# Patient Record
Sex: Female | Born: 1935 | Race: White | Hispanic: No | Smoking: Never smoker
Health system: Southern US, Community
[De-identification: ages and names within clinical notes are randomized; demographics above are authoritative.]

## PROBLEM LIST (undated history)

## (undated) ENCOUNTER — Emergency Department: Discharge status: Absent without leave | Payer: Medicare Other

## (undated) DIAGNOSIS — I82409 Acute embolism and thrombosis of unspecified deep veins of unspecified lower extremity: Secondary | ICD-10-CM

## (undated) DIAGNOSIS — E119 Type 2 diabetes mellitus without complications: Secondary | ICD-10-CM

## (undated) DIAGNOSIS — M359 Systemic involvement of connective tissue, unspecified: Secondary | ICD-10-CM

## (undated) DIAGNOSIS — I10 Essential (primary) hypertension: Secondary | ICD-10-CM

## (undated) DIAGNOSIS — C50919 Malignant neoplasm of unspecified site of unspecified female breast: Secondary | ICD-10-CM

## (undated) DIAGNOSIS — M1990 Unspecified osteoarthritis, unspecified site: Secondary | ICD-10-CM

## (undated) DIAGNOSIS — I509 Heart failure, unspecified: Secondary | ICD-10-CM

## (undated) DIAGNOSIS — N289 Disorder of kidney and ureter, unspecified: Secondary | ICD-10-CM

## (undated) DIAGNOSIS — K649 Unspecified hemorrhoids: Secondary | ICD-10-CM

## (undated) DIAGNOSIS — C801 Malignant (primary) neoplasm, unspecified: Secondary | ICD-10-CM

## (undated) DIAGNOSIS — I639 Cerebral infarction, unspecified: Secondary | ICD-10-CM

## (undated) HISTORY — PX: OOPHORECTOMY: SHX86

## (undated) HISTORY — PX: BACK SURGERY: SHX140

## (undated) HISTORY — PX: HAND SURGERY: SHX662

## (undated) HISTORY — DX: Unspecified osteoarthritis, unspecified site: M19.90

## (undated) HISTORY — DX: Unspecified hemorrhoids: K64.9

## (undated) HISTORY — PX: ABDOMINAL HYSTERECTOMY: SHX81

---

## 1997-08-10 HISTORY — PX: BREAST BIOPSY: SHX20

## 1998-04-17 ENCOUNTER — Encounter: Payer: Self-pay | Admitting: Neurosurgery

## 1998-04-17 ENCOUNTER — Inpatient Hospital Stay (HOSPITAL_COMMUNITY): Admission: RE | Admit: 1998-04-17 | Discharge: 1998-04-18 | Payer: Self-pay | Admitting: Neurosurgery

## 2004-06-02 ENCOUNTER — Ambulatory Visit: Payer: Self-pay | Admitting: Family Medicine

## 2005-09-14 ENCOUNTER — Ambulatory Visit: Payer: Self-pay | Admitting: Family Medicine

## 2006-02-16 ENCOUNTER — Ambulatory Visit: Payer: Self-pay | Admitting: Cardiovascular Disease

## 2006-09-23 ENCOUNTER — Ambulatory Visit: Payer: Self-pay

## 2007-09-27 ENCOUNTER — Ambulatory Visit: Payer: Self-pay | Admitting: Family Medicine

## 2007-10-17 ENCOUNTER — Ambulatory Visit: Payer: Self-pay | Admitting: Family Medicine

## 2007-10-23 ENCOUNTER — Emergency Department: Payer: Self-pay | Admitting: Emergency Medicine

## 2007-11-03 ENCOUNTER — Ambulatory Visit: Payer: Self-pay | Admitting: Family Medicine

## 2008-11-07 ENCOUNTER — Ambulatory Visit: Payer: Self-pay | Admitting: Family Medicine

## 2009-01-07 ENCOUNTER — Ambulatory Visit: Payer: Self-pay | Admitting: Family Medicine

## 2009-01-09 ENCOUNTER — Ambulatory Visit: Payer: Self-pay | Admitting: Family Medicine

## 2009-11-08 ENCOUNTER — Ambulatory Visit: Payer: Self-pay | Admitting: Family Medicine

## 2010-01-24 ENCOUNTER — Inpatient Hospital Stay: Payer: Self-pay | Admitting: Internal Medicine

## 2010-11-11 ENCOUNTER — Ambulatory Visit: Payer: Self-pay | Admitting: Family Medicine

## 2011-01-13 ENCOUNTER — Ambulatory Visit: Payer: Self-pay | Admitting: Ophthalmology

## 2011-03-03 ENCOUNTER — Ambulatory Visit: Payer: Self-pay | Admitting: Ophthalmology

## 2011-07-10 ENCOUNTER — Ambulatory Visit: Payer: Self-pay | Admitting: Family Medicine

## 2011-07-23 ENCOUNTER — Ambulatory Visit: Payer: Self-pay | Admitting: Family Medicine

## 2011-09-08 ENCOUNTER — Ambulatory Visit: Payer: Self-pay | Admitting: Family Medicine

## 2011-11-24 ENCOUNTER — Ambulatory Visit: Payer: Self-pay | Admitting: Family Medicine

## 2011-12-01 ENCOUNTER — Ambulatory Visit: Payer: Self-pay | Admitting: Family Medicine

## 2011-12-09 ENCOUNTER — Ambulatory Visit: Payer: Self-pay | Admitting: Family Medicine

## 2012-07-19 ENCOUNTER — Ambulatory Visit: Payer: Self-pay | Admitting: Family Medicine

## 2012-08-09 ENCOUNTER — Ambulatory Visit: Payer: Self-pay | Admitting: Family Medicine

## 2012-08-14 ENCOUNTER — Emergency Department: Payer: Self-pay | Admitting: Emergency Medicine

## 2012-08-14 LAB — COMPREHENSIVE METABOLIC PANEL
Alkaline Phosphatase: 103 U/L (ref 50–136)
Anion Gap: 10 (ref 7–16)
BUN: 15 mg/dL (ref 7–18)
Bilirubin,Total: 0.4 mg/dL (ref 0.2–1.0)
Chloride: 97 mmol/L — ABNORMAL LOW (ref 98–107)
Co2: 25 mmol/L (ref 21–32)
Creatinine: 1.39 mg/dL — ABNORMAL HIGH (ref 0.60–1.30)
EGFR (African American): 41 — ABNORMAL LOW
Glucose: 103 mg/dL — ABNORMAL HIGH (ref 65–99)
Osmolality: 266 (ref 275–301)
SGPT (ALT): 16 U/L (ref 12–78)

## 2012-08-14 LAB — CK TOTAL AND CKMB (NOT AT ARMC)
CK, Total: 247 U/L — ABNORMAL HIGH (ref 21–215)
CK-MB: 1.6 ng/mL (ref 0.5–3.6)

## 2012-08-14 LAB — CBC
HGB: 9.6 g/dL — ABNORMAL LOW (ref 12.0–16.0)
MCV: 85 fL (ref 80–100)
Platelet: 277 10*3/uL (ref 150–440)
RDW: 14.1 % (ref 11.5–14.5)
WBC: 9.9 10*3/uL (ref 3.6–11.0)

## 2012-08-18 ENCOUNTER — Observation Stay: Payer: Self-pay | Admitting: Internal Medicine

## 2012-08-18 LAB — BASIC METABOLIC PANEL
Chloride: 99 mmol/L (ref 98–107)
Co2: 23 mmol/L (ref 21–32)
Creatinine: 1.94 mg/dL — ABNORMAL HIGH (ref 0.60–1.30)
EGFR (African American): 27 — ABNORMAL LOW
EGFR (Non-African Amer.): 24 — ABNORMAL LOW

## 2012-08-18 LAB — CBC
HGB: 9.5 g/dL — ABNORMAL LOW (ref 12.0–16.0)
MCHC: 33.5 g/dL (ref 32.0–36.0)
Platelet: 311 10*3/uL (ref 150–440)
RBC: 3.33 10*6/uL — ABNORMAL LOW (ref 3.80–5.20)

## 2012-08-18 LAB — COMPREHENSIVE METABOLIC PANEL
Albumin: 2.9 g/dL — ABNORMAL LOW (ref 3.4–5.0)
Alkaline Phosphatase: 94 U/L (ref 50–136)
Anion Gap: 11 (ref 7–16)
BUN: 33 mg/dL — ABNORMAL HIGH (ref 7–18)
Chloride: 95 mmol/L — ABNORMAL LOW (ref 98–107)
EGFR (African American): 28 — ABNORMAL LOW
EGFR (Non-African Amer.): 24 — ABNORMAL LOW
Potassium: 4.3 mmol/L (ref 3.5–5.1)
SGPT (ALT): 24 U/L (ref 12–78)
Sodium: 126 mmol/L — ABNORMAL LOW (ref 136–145)

## 2012-08-18 LAB — CK TOTAL AND CKMB (NOT AT ARMC)
CK, Total: 201 U/L (ref 21–215)
CK-MB: 2.2 ng/mL (ref 0.5–3.6)

## 2012-08-19 LAB — URINALYSIS, COMPLETE
Bilirubin,UR: NEGATIVE
Glucose,UR: NEGATIVE mg/dL (ref 0–75)
Ketone: NEGATIVE
Ph: 5 (ref 4.5–8.0)
Protein: NEGATIVE
RBC,UR: 1 /HPF (ref 0–5)
Specific Gravity: 1.008 (ref 1.003–1.030)
WBC UR: 1 /HPF (ref 0–5)

## 2012-08-19 LAB — BASIC METABOLIC PANEL
Anion Gap: 7 (ref 7–16)
BUN: 28 mg/dL — ABNORMAL HIGH (ref 7–18)
Co2: 24 mmol/L (ref 21–32)
Creatinine: 1.63 mg/dL — ABNORMAL HIGH (ref 0.60–1.30)
EGFR (African American): 34 — ABNORMAL LOW
EGFR (Non-African Amer.): 29 — ABNORMAL LOW
Glucose: 108 mg/dL — ABNORMAL HIGH (ref 65–99)
Osmolality: 272 (ref 275–301)
Potassium: 3.9 mmol/L (ref 3.5–5.1)
Sodium: 133 mmol/L — ABNORMAL LOW (ref 136–145)

## 2012-08-20 LAB — CREATININE, SERUM
Creatinine: 1.49 mg/dL — ABNORMAL HIGH (ref 0.60–1.30)
EGFR (African American): 38 — ABNORMAL LOW

## 2012-08-21 LAB — URINE CULTURE

## 2012-09-06 ENCOUNTER — Ambulatory Visit: Payer: Self-pay | Admitting: Family Medicine

## 2012-09-07 DIAGNOSIS — I059 Rheumatic mitral valve disease, unspecified: Secondary | ICD-10-CM

## 2012-12-02 ENCOUNTER — Ambulatory Visit: Payer: Self-pay | Admitting: Family Medicine

## 2013-03-25 LAB — COMPREHENSIVE METABOLIC PANEL
Albumin: 3.3 g/dL — ABNORMAL LOW (ref 3.4–5.0)
Alkaline Phosphatase: 95 U/L (ref 50–136)
Bilirubin,Total: 0.2 mg/dL (ref 0.2–1.0)
Co2: 24 mmol/L (ref 21–32)
Creatinine: 1.78 mg/dL — ABNORMAL HIGH (ref 0.60–1.30)
EGFR (African American): 30 — ABNORMAL LOW
EGFR (Non-African Amer.): 26 — ABNORMAL LOW
Osmolality: 255 (ref 275–301)
Potassium: 5.1 mmol/L (ref 3.5–5.1)
SGOT(AST): 15 U/L (ref 15–37)
SGPT (ALT): 17 U/L (ref 12–78)
Total Protein: 7.4 g/dL (ref 6.4–8.2)

## 2013-03-25 LAB — CBC
HCT: 30.3 % — ABNORMAL LOW (ref 35.0–47.0)
HGB: 10.6 g/dL — ABNORMAL LOW (ref 12.0–16.0)
MCHC: 35 g/dL (ref 32.0–36.0)
RBC: 3.6 10*6/uL — ABNORMAL LOW (ref 3.80–5.20)
WBC: 7.2 10*3/uL (ref 3.6–11.0)

## 2013-03-26 DIAGNOSIS — I517 Cardiomegaly: Secondary | ICD-10-CM

## 2013-03-27 LAB — BASIC METABOLIC PANEL
Anion Gap: 7 (ref 7–16)
BUN: 24 mg/dL — ABNORMAL HIGH (ref 7–18)
BUN: 24 mg/dL — ABNORMAL HIGH (ref 7–18)
Calcium, Total: 8.7 mg/dL (ref 8.5–10.1)
Chloride: 98 mmol/L (ref 98–107)
Co2: 22 mmol/L (ref 21–32)
Creatinine: 1.25 mg/dL (ref 0.60–1.30)
EGFR (African American): 45 — ABNORMAL LOW
EGFR (Non-African Amer.): 38 — ABNORMAL LOW
EGFR (Non-African Amer.): 40 — ABNORMAL LOW
Glucose: 64 mg/dL — ABNORMAL LOW (ref 65–99)
Glucose: 57 mg/dL — ABNORMAL LOW (ref 65–99)
Osmolality: 257 (ref 275–301)
Osmolality: 250 (ref 275–301)
Potassium: 5.1 mmol/L (ref 3.5–5.1)

## 2013-03-28 ENCOUNTER — Inpatient Hospital Stay: Payer: Self-pay | Admitting: Internal Medicine

## 2013-03-28 LAB — BASIC METABOLIC PANEL
BUN: 18 mg/dL (ref 7–18)
Calcium, Total: 8.3 mg/dL — ABNORMAL LOW (ref 8.5–10.1)
Co2: 21 mmol/L (ref 21–32)
EGFR (African American): 48 — ABNORMAL LOW
EGFR (Non-African Amer.): 42 — ABNORMAL LOW
Osmolality: 245 (ref 275–301)
Potassium: 5 mmol/L (ref 3.5–5.1)
Sodium: 121 mmol/L — ABNORMAL LOW (ref 136–145)

## 2013-03-28 LAB — TSH: Thyroid Stimulating Horm: 1.12 u[IU]/mL

## 2013-03-28 LAB — CBC WITH DIFFERENTIAL/PLATELET
Basophil %: 0.6 %
Eosinophil #: 0.1 10*3/uL (ref 0.0–0.7)
HGB: 9.4 g/dL — ABNORMAL LOW (ref 12.0–16.0)
Lymphocyte #: 1.3 10*3/uL (ref 1.0–3.6)
Lymphocyte %: 18.2 %
MCH: 29.8 pg (ref 26.0–34.0)
MCHC: 35.4 g/dL (ref 32.0–36.0)
MCV: 84 fL (ref 80–100)
Monocyte %: 6.7 %
RBC: 3.15 10*6/uL — ABNORMAL LOW (ref 3.80–5.20)
WBC: 7.4 10*3/uL (ref 3.6–11.0)

## 2013-03-28 LAB — URIC ACID: Uric Acid: 3.6 mg/dL (ref 2.6–6.0)

## 2013-03-28 LAB — OSMOLALITY: Osmolality: 250 mOsm/kg — CL (ref 280–301)

## 2013-03-29 LAB — BASIC METABOLIC PANEL
BUN: 24 mg/dL — ABNORMAL HIGH (ref 7–18)
Calcium, Total: 8.6 mg/dL (ref 8.5–10.1)
Chloride: 89 mmol/L — ABNORMAL LOW (ref 98–107)
Co2: 23 mmol/L (ref 21–32)
Creatinine: 1.32 mg/dL — ABNORMAL HIGH (ref 0.60–1.30)
EGFR (African American): 44 — ABNORMAL LOW
EGFR (Non-African Amer.): 38 — ABNORMAL LOW
Sodium: 119 mmol/L — CL (ref 136–145)

## 2013-03-29 LAB — PROTEIN / CREATININE RATIO, URINE
Creatinine, Urine: 33.3 mg/dL (ref 30.0–125.0)
Protein/Creat. Ratio: 180 mg/gCREAT (ref 0–200)

## 2013-03-29 LAB — PHOSPHORUS: Phosphorus: 3.7 mg/dL (ref 2.5–4.9)

## 2013-03-29 LAB — SODIUM: Sodium: 120 mmol/L — CL (ref 136–145)

## 2013-03-30 LAB — SODIUM
Sodium: 121 mmol/L — ABNORMAL LOW (ref 136–145)
Sodium: 118 mmol/L — CL (ref 136–145)

## 2013-03-30 LAB — BASIC METABOLIC PANEL
Anion Gap: 7 (ref 7–16)
BUN: 28 mg/dL — ABNORMAL HIGH (ref 7–18)
Calcium, Total: 8.4 mg/dL — ABNORMAL LOW (ref 8.5–10.1)
Co2: 24 mmol/L (ref 21–32)
Creatinine: 1.49 mg/dL — ABNORMAL HIGH (ref 0.60–1.30)
EGFR (Non-African Amer.): 33 — ABNORMAL LOW
Osmolality: 251 (ref 275–301)
Potassium: 4.7 mmol/L (ref 3.5–5.1)

## 2013-03-30 LAB — UR PROT ELECTROPHORESIS, URINE RANDOM

## 2013-03-31 LAB — BASIC METABOLIC PANEL
Anion Gap: 7 (ref 7–16)
BUN: 29 mg/dL — ABNORMAL HIGH (ref 7–18)
Chloride: 92 mmol/L — ABNORMAL LOW (ref 98–107)
EGFR (Non-African Amer.): 33 — ABNORMAL LOW
Glucose: 93 mg/dL (ref 65–99)
Osmolality: 253 (ref 275–301)
Potassium: 5 mmol/L (ref 3.5–5.1)
Sodium: 123 mmol/L — ABNORMAL LOW (ref 136–145)

## 2013-03-31 LAB — SODIUM
Sodium: 121 mmol/L — ABNORMAL LOW (ref 136–145)
Sodium: 121 mmol/L — ABNORMAL LOW (ref 136–145)
Sodium: 123 mmol/L — ABNORMAL LOW (ref 136–145)

## 2013-04-01 LAB — BASIC METABOLIC PANEL
BUN: 25 mg/dL — ABNORMAL HIGH (ref 7–18)
Co2: 23 mmol/L (ref 21–32)
Creatinine: 1.24 mg/dL (ref 0.60–1.30)
EGFR (African American): 47 — ABNORMAL LOW
Osmolality: 255 (ref 275–301)
Potassium: 4.9 mmol/L (ref 3.5–5.1)

## 2013-04-01 LAB — SODIUM: Sodium: 128 mmol/L — ABNORMAL LOW (ref 136–145)

## 2013-04-02 LAB — SODIUM: Sodium: 129 mmol/L — ABNORMAL LOW (ref 136–145)

## 2013-04-03 LAB — BASIC METABOLIC PANEL
Anion Gap: 8 (ref 7–16)
BUN: 19 mg/dL — ABNORMAL HIGH (ref 7–18)
Calcium, Total: 8.6 mg/dL (ref 8.5–10.1)
Co2: 24 mmol/L (ref 21–32)
EGFR (Non-African Amer.): 38 — ABNORMAL LOW
Osmolality: 262 (ref 275–301)
Sodium: 130 mmol/L — ABNORMAL LOW (ref 136–145)

## 2013-05-18 ENCOUNTER — Ambulatory Visit: Payer: Self-pay | Admitting: Internal Medicine

## 2013-05-18 ENCOUNTER — Ambulatory Visit: Payer: Self-pay | Admitting: Family Medicine

## 2013-05-18 LAB — SODIUM: Sodium: 132 mmol/L — ABNORMAL LOW (ref 136–145)

## 2013-05-18 LAB — RETICULOCYTES
Absolute Retic Count: 0.0764 10*6/uL (ref 0.019–0.186)
Reticulocyte: 2.1 % (ref 0.4–3.1)

## 2013-06-10 ENCOUNTER — Ambulatory Visit: Payer: Self-pay | Admitting: Internal Medicine

## 2013-06-30 ENCOUNTER — Emergency Department: Payer: Self-pay | Admitting: Internal Medicine

## 2013-07-10 ENCOUNTER — Ambulatory Visit: Payer: Self-pay | Admitting: Internal Medicine

## 2013-07-19 ENCOUNTER — Ambulatory Visit: Payer: Self-pay | Admitting: Orthopedic Surgery

## 2013-07-20 ENCOUNTER — Emergency Department (HOSPITAL_COMMUNITY): Payer: Medicare Other

## 2013-07-20 ENCOUNTER — Inpatient Hospital Stay (HOSPITAL_COMMUNITY)
Admission: EM | Admit: 2013-07-20 | Discharge: 2013-07-26 | DRG: 519 | Disposition: A | Discharge status: Home / Self Care | Payer: Medicare Other | Attending: Internal Medicine | Admitting: Internal Medicine

## 2013-07-20 ENCOUNTER — Inpatient Hospital Stay (HOSPITAL_COMMUNITY): Payer: Medicare Other

## 2013-07-20 ENCOUNTER — Encounter (HOSPITAL_COMMUNITY): Payer: Self-pay | Admitting: Emergency Medicine

## 2013-07-20 DIAGNOSIS — Z8673 Personal history of transient ischemic attack (TIA), and cerebral infarction without residual deficits: Secondary | ICD-10-CM

## 2013-07-20 DIAGNOSIS — K5900 Constipation, unspecified: Secondary | ICD-10-CM | POA: Diagnosis not present

## 2013-07-20 DIAGNOSIS — M48062 Spinal stenosis, lumbar region with neurogenic claudication: Secondary | ICD-10-CM | POA: Diagnosis present

## 2013-07-20 DIAGNOSIS — M51379 Other intervertebral disc degeneration, lumbosacral region without mention of lumbar back pain or lower extremity pain: Secondary | ICD-10-CM | POA: Diagnosis present

## 2013-07-20 DIAGNOSIS — M47817 Spondylosis without myelopathy or radiculopathy, lumbosacral region: Secondary | ICD-10-CM | POA: Diagnosis present

## 2013-07-20 DIAGNOSIS — E669 Obesity, unspecified: Secondary | ICD-10-CM | POA: Diagnosis present

## 2013-07-20 DIAGNOSIS — E871 Hypo-osmolality and hyponatremia: Secondary | ICD-10-CM | POA: Diagnosis present

## 2013-07-20 DIAGNOSIS — M5136 Other intervertebral disc degeneration, lumbar region: Secondary | ICD-10-CM

## 2013-07-20 DIAGNOSIS — M62838 Other muscle spasm: Secondary | ICD-10-CM | POA: Diagnosis present

## 2013-07-20 DIAGNOSIS — E119 Type 2 diabetes mellitus without complications: Secondary | ICD-10-CM | POA: Diagnosis present

## 2013-07-20 DIAGNOSIS — M51369 Other intervertebral disc degeneration, lumbar region without mention of lumbar back pain or lower extremity pain: Secondary | ICD-10-CM

## 2013-07-20 DIAGNOSIS — Z6841 Body Mass Index (BMI) 40.0 and over, adult: Secondary | ICD-10-CM

## 2013-07-20 DIAGNOSIS — M5137 Other intervertebral disc degeneration, lumbosacral region: Secondary | ICD-10-CM

## 2013-07-20 DIAGNOSIS — K219 Gastro-esophageal reflux disease without esophagitis: Secondary | ICD-10-CM | POA: Diagnosis present

## 2013-07-20 DIAGNOSIS — M549 Dorsalgia, unspecified: Secondary | ICD-10-CM

## 2013-07-20 DIAGNOSIS — N183 Chronic kidney disease, stage 3 unspecified: Secondary | ICD-10-CM | POA: Diagnosis present

## 2013-07-20 DIAGNOSIS — Z981 Arthrodesis status: Secondary | ICD-10-CM

## 2013-07-20 DIAGNOSIS — M48061 Spinal stenosis, lumbar region without neurogenic claudication: Secondary | ICD-10-CM | POA: Diagnosis present

## 2013-07-20 DIAGNOSIS — I509 Heart failure, unspecified: Secondary | ICD-10-CM | POA: Diagnosis present

## 2013-07-20 DIAGNOSIS — Z7982 Long term (current) use of aspirin: Secondary | ICD-10-CM

## 2013-07-20 DIAGNOSIS — E875 Hyperkalemia: Secondary | ICD-10-CM | POA: Diagnosis present

## 2013-07-20 DIAGNOSIS — I129 Hypertensive chronic kidney disease with stage 1 through stage 4 chronic kidney disease, or unspecified chronic kidney disease: Secondary | ICD-10-CM

## 2013-07-20 DIAGNOSIS — Z7902 Long term (current) use of antithrombotics/antiplatelets: Secondary | ICD-10-CM

## 2013-07-20 DIAGNOSIS — I10 Essential (primary) hypertension: Secondary | ICD-10-CM | POA: Diagnosis present

## 2013-07-20 DIAGNOSIS — M5126 Other intervertebral disc displacement, lumbar region: Principal | ICD-10-CM | POA: Diagnosis present

## 2013-07-20 DIAGNOSIS — D649 Anemia, unspecified: Secondary | ICD-10-CM | POA: Diagnosis present

## 2013-07-20 DIAGNOSIS — Z79899 Other long term (current) drug therapy: Secondary | ICD-10-CM

## 2013-07-20 HISTORY — DX: Type 2 diabetes mellitus without complications: E11.9

## 2013-07-20 HISTORY — DX: Cerebral infarction, unspecified: I63.9

## 2013-07-20 HISTORY — DX: Heart failure, unspecified: I50.9

## 2013-07-20 HISTORY — DX: Essential (primary) hypertension: I10

## 2013-07-20 HISTORY — DX: Disorder of kidney and ureter, unspecified: N28.9

## 2013-07-20 HISTORY — DX: Malignant (primary) neoplasm, unspecified: C80.1

## 2013-07-20 LAB — CBC WITH DIFFERENTIAL/PLATELET
Eosinophils Absolute: 0.2 10*3/uL (ref 0.0–0.7)
Eosinophils Relative: 3 % (ref 0–5)
HCT: 31.3 % — ABNORMAL LOW (ref 36.0–46.0)
Hemoglobin: 10.8 g/dL — ABNORMAL LOW (ref 12.0–15.0)
Lymphocytes Relative: 16 % (ref 12–46)
Lymphs Abs: 1 10*3/uL (ref 0.7–4.0)
MCH: 29.2 pg (ref 26.0–34.0)
MCV: 84.6 fL (ref 78.0–100.0)
Monocytes Relative: 6 % (ref 3–12)
Platelets: 229 10*3/uL (ref 150–400)
RBC: 3.7 MIL/uL — ABNORMAL LOW (ref 3.87–5.11)
WBC: 6.4 10*3/uL (ref 4.0–10.5)

## 2013-07-20 LAB — GLUCOSE, CAPILLARY
Glucose-Capillary: 217 mg/dL — ABNORMAL HIGH (ref 70–99)
Glucose-Capillary: 124 mg/dL — ABNORMAL HIGH (ref 70–99)

## 2013-07-20 LAB — OSMOLALITY, URINE: Osmolality, Ur: 310 mOsm/kg — ABNORMAL LOW (ref 390–1090)

## 2013-07-20 LAB — COMPREHENSIVE METABOLIC PANEL
BUN: 26 mg/dL — ABNORMAL HIGH (ref 6–23)
Calcium: 8.8 mg/dL (ref 8.4–10.5)
Chloride: 87 mEq/L — ABNORMAL LOW (ref 96–112)
Creatinine, Ser: 1.43 mg/dL — ABNORMAL HIGH (ref 0.50–1.10)
GFR calc Af Amer: 39 mL/min — ABNORMAL LOW (ref >90)
Glucose, Bld: 179 mg/dL — ABNORMAL HIGH (ref 70–99)
Total Protein: 7.3 g/dL (ref 6.0–8.3)

## 2013-07-20 LAB — SODIUM, URINE, RANDOM: Sodium, Ur: 17 mEq/L

## 2013-07-20 LAB — BASIC METABOLIC PANEL
BUN: 24 mg/dL — ABNORMAL HIGH (ref 6–23)
Creatinine, Ser: 1.56 mg/dL — ABNORMAL HIGH (ref 0.50–1.10)
GFR calc Af Amer: 35 mL/min — ABNORMAL LOW (ref >90)
GFR calc non Af Amer: 30 mL/min — ABNORMAL LOW (ref >90)

## 2013-07-20 MED ORDER — INSULIN ASPART 100 UNIT/ML ~~LOC~~ SOLN
0.0000 [IU] | Freq: Four times a day (QID) | SUBCUTANEOUS | Status: DC
  Administered 2013-07-20: 3 [IU] via SUBCUTANEOUS

## 2013-07-20 MED ORDER — SIMVASTATIN 40 MG PO TABS: 40.0000 mg | ORAL_TABLET | Freq: Every day | ORAL | Status: DC

## 2013-07-20 MED ORDER — INSULIN DETEMIR 100 UNIT/ML ~~LOC~~ SOLN
5.0000 [IU] | Freq: Every day | SUBCUTANEOUS | Status: DC
  Administered 2013-07-20 – 2013-07-25 (×6): 5 [IU] via SUBCUTANEOUS
  Filled 2013-07-20 (×7): qty 0.05

## 2013-07-20 MED ORDER — CYCLOBENZAPRINE HCL 5 MG PO TABS
5.0000 mg | ORAL_TABLET | Freq: Every day | ORAL | Status: DC
  Administered 2013-07-20: 5 mg via ORAL
  Filled 2013-07-20 (×2): qty 1

## 2013-07-20 MED ORDER — HYDROMORPHONE HCL PF 1 MG/ML IJ SOLN
0.5000 mg | INTRAMUSCULAR | Status: DC | PRN
  Administered 2013-07-20 – 2013-07-21 (×4): 0.5 mg via INTRAVENOUS
  Filled 2013-07-20 (×4): qty 1

## 2013-07-20 MED ORDER — OXYBUTYNIN CHLORIDE 5 MG PO TABS
5.0000 mg | ORAL_TABLET | Freq: Every day | ORAL | Status: DC
  Administered 2013-07-21 – 2013-07-22 (×2): 5 mg via ORAL
  Filled 2013-07-20 (×6): qty 1

## 2013-07-20 MED ORDER — OXYBUTYNIN CHLORIDE ER 5 MG PO TB24: 5.0000 mg | ORAL_TABLET | Freq: Two times a day (BID) | ORAL | Status: DC

## 2013-07-20 MED ORDER — METOPROLOL SUCCINATE ER 100 MG PO TB24
100.0000 mg | ORAL_TABLET | Freq: Every day | ORAL | Status: DC
  Administered 2013-07-20 – 2013-07-26 (×6): 100 mg via ORAL
  Filled 2013-07-20 (×7): qty 1

## 2013-07-20 MED ORDER — CLOPIDOGREL BISULFATE 75 MG PO TABS: 75.0000 mg | ORAL_TABLET | Freq: Every day | ORAL | Status: DC

## 2013-07-20 MED ORDER — INSULIN ASPART 100 UNIT/ML ~~LOC~~ SOLN
0.0000 [IU] | Freq: Three times a day (TID) | SUBCUTANEOUS | Status: DC
  Administered 2013-07-20: 5 [IU] via SUBCUTANEOUS
  Administered 2013-07-21: 2 [IU] via SUBCUTANEOUS
  Administered 2013-07-21 – 2013-07-23 (×5): 3 [IU] via SUBCUTANEOUS
  Administered 2013-07-23 (×2): 2 [IU] via SUBCUTANEOUS
  Administered 2013-07-24: 5 [IU] via SUBCUTANEOUS
  Administered 2013-07-25: 3 [IU] via SUBCUTANEOUS
  Administered 2013-07-25 (×2): 5 [IU] via SUBCUTANEOUS
  Administered 2013-07-26 (×2): 3 [IU] via SUBCUTANEOUS

## 2013-07-20 MED ORDER — OXYBUTYNIN CHLORIDE 5 MG PO TABS
10.0000 mg | ORAL_TABLET | Freq: Every day | ORAL | Status: DC
  Administered 2013-07-20 – 2013-07-25 (×6): 10 mg via ORAL
  Filled 2013-07-20 (×7): qty 2

## 2013-07-20 MED ORDER — HEPARIN SODIUM (PORCINE) 5000 UNIT/ML IJ SOLN
5000.0000 [IU] | Freq: Three times a day (TID) | INTRAMUSCULAR | Status: DC
  Administered 2013-07-20 – 2013-07-23 (×10): 5000 [IU] via SUBCUTANEOUS
  Filled 2013-07-20 (×15): qty 1

## 2013-07-20 MED ORDER — AMOXICILLIN-POT CLAVULANATE 875-125 MG PO TABS
1.0000 | ORAL_TABLET | Freq: Two times a day (BID) | ORAL | Status: AC
  Administered 2013-07-20 – 2013-07-21 (×4): 1 via ORAL
  Filled 2013-07-20 (×4): qty 1

## 2013-07-20 MED ORDER — HYDROMORPHONE HCL PF 1 MG/ML IJ SOLN
0.5000 mg | Freq: Once | INTRAMUSCULAR | Status: AC
  Administered 2013-07-20: 0.5 mg via INTRAVENOUS
  Filled 2013-07-20: qty 1

## 2013-07-20 MED ORDER — HYDROMORPHONE HCL PF 1 MG/ML IJ SOLN: 1.0000 mg | Freq: Once | INTRAMUSCULAR | Status: DC

## 2013-07-20 MED ORDER — AMLODIPINE BESYLATE 5 MG PO TABS
5.0000 mg | ORAL_TABLET | Freq: Every day | ORAL | Status: DC
  Administered 2013-07-20 – 2013-07-26 (×6): 5 mg via ORAL
  Filled 2013-07-20 (×7): qty 1

## 2013-07-20 MED ORDER — PRAVASTATIN SODIUM 40 MG PO TABS
80.0000 mg | ORAL_TABLET | Freq: Every day | ORAL | Status: DC
  Administered 2013-07-20 – 2013-07-26 (×6): 80 mg via ORAL
  Filled 2013-07-20 (×7): qty 2

## 2013-07-20 MED ORDER — GABAPENTIN 100 MG PO CAPS
200.0000 mg | ORAL_CAPSULE | Freq: Every day | ORAL | Status: DC
  Administered 2013-07-20 – 2013-07-25 (×6): 200 mg via ORAL
  Filled 2013-07-20 (×7): qty 2

## 2013-07-20 MED ORDER — HYDRALAZINE HCL 25 MG PO TABS
25.0000 mg | ORAL_TABLET | Freq: Three times a day (TID) | ORAL | Status: DC
  Administered 2013-07-20 – 2013-07-26 (×15): 25 mg via ORAL
  Filled 2013-07-20 (×20): qty 1

## 2013-07-20 MED ORDER — ASPIRIN EC 81 MG PO TBEC
81.0000 mg | DELAYED_RELEASE_TABLET | Freq: Every day | ORAL | Status: DC
  Administered 2013-07-20: 81 mg via ORAL
  Filled 2013-07-20: qty 1

## 2013-07-20 MED ORDER — KETOROLAC TROMETHAMINE 30 MG/ML IJ SOLN
30.0000 mg | Freq: Once | INTRAMUSCULAR | Status: AC
  Administered 2013-07-20: 30 mg via INTRAVENOUS
  Filled 2013-07-20: qty 1

## 2013-07-20 MED ORDER — DIAZEPAM 2 MG PO TABS
2.0000 mg | ORAL_TABLET | Freq: Once | ORAL | Status: AC
  Administered 2013-07-20: 2 mg via ORAL
  Filled 2013-07-20: qty 1

## 2013-07-20 MED ORDER — POLYETHYLENE GLYCOL 3350 17 G PO PACK
17.0000 g | PACK | Freq: Every day | ORAL | Status: DC
  Administered 2013-07-20 – 2013-07-25 (×5): 17 g via ORAL
  Filled 2013-07-20 (×7): qty 1

## 2013-07-20 MED ORDER — ONDANSETRON HCL 4 MG/2ML IJ SOLN
4.0000 mg | Freq: Once | INTRAMUSCULAR | Status: AC
  Administered 2013-07-20: 4 mg via INTRAVENOUS
  Filled 2013-07-20: qty 2

## 2013-07-20 MED ORDER — PANTOPRAZOLE SODIUM 40 MG PO TBEC
40.0000 mg | DELAYED_RELEASE_TABLET | Freq: Every day | ORAL | Status: DC
  Administered 2013-07-20 – 2013-07-26 (×6): 40 mg via ORAL
  Filled 2013-07-20 (×3): qty 1

## 2013-07-20 MED ORDER — SODIUM CHLORIDE 0.9 % IV SOLN
Freq: Once | INTRAVENOUS | Status: AC
  Administered 2013-07-20: 1000 mL via INTRAVENOUS

## 2013-07-20 MED ORDER — LISINOPRIL 40 MG PO TABS
40.0000 mg | ORAL_TABLET | Freq: Every day | ORAL | Status: DC
  Administered 2013-07-21 – 2013-07-26 (×5): 40 mg via ORAL
  Filled 2013-07-20 (×6): qty 1

## 2013-07-20 NOTE — ED Notes (Signed)
Dr. Rancour at bedside. 

## 2013-07-20 NOTE — ED Notes (Signed)
Report called to RN on 4N.

## 2013-07-20 NOTE — ED Notes (Signed)
Pt arrives from home via wheelchair with worsening back pain for the last several months. States had MRI done yesterday at [facility]. Pain is in lower back radiating to bilateral lower extremities. Just had pain medication increased due to increasing pain. Denies bowel bladder incontinence. Denies recent fall or fever. Pt alert, oriented x4, moderate distress due to pain, tearful. VSS.

## 2013-07-20 NOTE — Consult Note (Signed)
Reason for Consult: Back and leg pain Referring Physician: teaching service  Kristen Barron is an 79 y.o. female.  HPI: The patient is an 79 year old female who is status post previous L4-5 decompression and fusion surgery done in 1999. The patient reports that she is began to have difficulty with back pain and some intermittent symptoms of claudication over the past couple of years. The symptoms been bothersome but not particularly Limiting. Approximately 1 month ago the patient's pain changed in character. The patient now developed severe back pain with radiation to both anterior and posterior aspects of her lower extremities. The pain was worsened with trying to stand straight or walking. The patient progressively became less and less active secondary to this pain. She presented to the emergency department today with severe back and leg pain that was even worse than previously been. She denies any motor loss. She's having no symptoms of incontinence.    She's had an MRI scan of her lumbar spine. MRI scan was done at [facility]. This demonstrates postoperative change of decompression and fusion at L4-5. At L3-4 the patient has evidence of adjacent level degeneration with degenerative disc space collapse and significant facet arthropathy causing moderately severe spinal stenosis. Coincidental with this is a large broad-based disc herniation at L3-4 with some caudal migration which appears to be more acute. This is causing critical stenosis and compression upon the thecal sac and nerve roots. The patient has some disc degeneration and facet arthropathy with stenosis at L2-3. The remainder of her lumbar spine is unremarkable.  Past Medical History  Diagnosis Date  . Hypertension   . CHF (congestive heart failure)   . Stroke     August 2014, but has had strokes prior as well  . Cancer     Reports lumpectomy for a cyst  . Renal disorder   . Diabetes     Past Surgical History  Procedure Laterality Date   . Abdominal hysterectomy      Patient not clear as to why  . Back surgery    . Hand surgery Right     Carpel tunnel release in the 1970s    History reviewed. No pertinent family history.  Social History:  reports that she has never smoked. She has never used smokeless tobacco. She reports that she does not drink alcohol or use illicit drugs.  Allergies: No Known Allergies  Medications: I have reviewed the patient's current medications.  Results for orders placed during the hospital encounter of 07/20/13 (from the past 48 hour(s))  CBC WITH DIFFERENTIAL     Status: Abnormal   Collection Time    07/20/13  8:34 AM      Result Value Range   WBC 6.4  4.0 - 10.5 K/uL   RBC 3.70 (*) 3.87 - 5.11 MIL/uL   Hemoglobin 10.8 (*) 12.0 - 15.0 g/dL   HCT 21.3 (*) 08.6 - 57.8 %   MCV 84.6  78.0 - 100.0 fL   MCH 29.2  26.0 - 34.0 pg   MCHC 34.5  30.0 - 36.0 g/dL   RDW 46.9  62.9 - 52.8 %   Platelets 229  150 - 400 K/uL   Neutrophils Relative % 75  43 - 77 %   Neutro Abs 4.8  1.7 - 7.7 K/uL   Lymphocytes Relative 16  12 - 46 %   Lymphs Abs 1.0  0.7 - 4.0 K/uL   Monocytes Relative 6  3 - 12 %   Monocytes Absolute 0.4  0.1 - 1.0 K/uL   Eosinophils Relative 3  0 - 5 %   Eosinophils Absolute 0.2  0.0 - 0.7 K/uL   Basophils Relative 0  0 - 1 %   Basophils Absolute 0.0  0.0 - 0.1 K/uL  COMPREHENSIVE METABOLIC PANEL     Status: Abnormal   Collection Time    07/20/13  8:34 AM      Result Value Range   Sodium 124 (*) 135 - 145 mEq/L   Potassium 4.3  3.5 - 5.1 mEq/L   Chloride 87 (*) 96 - 112 mEq/L   CO2 25  19 - 32 mEq/L   Glucose, Bld 179 (*) 70 - 99 mg/dL   BUN 26 (*) 6 - 23 mg/dL   Creatinine, Ser 4.09 (*) 0.50 - 1.10 mg/dL   Calcium 8.8  8.4 - 81.1 mg/dL   Total Protein 7.3  6.0 - 8.3 g/dL   Albumin 3.5  3.5 - 5.2 g/dL   AST 17  0 - 37 U/L   ALT 12  0 - 35 U/L   Alkaline Phosphatase 75  39 - 117 U/L   Total Bilirubin 0.5  0.3 - 1.2 mg/dL   GFR calc non Af Amer 33 (*) >90 mL/min    GFR calc Af Amer 39 (*) >90 mL/min   Comment: (NOTE)     The eGFR has been calculated using the CKD EPI equation.     This calculation has not been validated in all clinical situations.     eGFR's persistently <90 mL/min signify possible Chronic Kidney     Disease.  GLUCOSE, CAPILLARY     Status: Abnormal   Collection Time    07/20/13  1:59 PM      Result Value Range   Glucose-Capillary 180 (*) 70 - 99 mg/dL  GLUCOSE, CAPILLARY     Status: Abnormal   Collection Time    07/20/13  5:25 PM      Result Value Range   Glucose-Capillary 217 (*) 70 - 99 mg/dL    US Abdomen Complete  07/20/2013   CLINICAL DATA:  Back pain  EXAM: ULTRASOUND ABDOMEN COMPLETE  COMPARISON:  None.  FINDINGS: Gallbladder:  A nonmobile 1.4 cm gallstone appreciated. There is no evidence of wall thickening, measuring 2.7 mm. There is no evidence of a sonographic Murphy's sign no pericholecystic fluid.  Common bile duct:  Diameter: 4.5 mm  Liver:  No focal lesion identified. Within normal limits in parenchymal echogenicity.  IVC:  No abnormality visualized.  Pancreas:  Visualized portion unremarkable.  Spleen:  Multiple punctate echogenic foci with reverberation artifact identified within the spleen indicative of multiple calcifications reflecting calcified granulomas. Otherwise unremarkable.  Right Kidney:  Length: 9.6 cm. Echogenicity within normal limits. No mass or hydronephrosis visualized.  Left Kidney:  Length: 10 cm. Echogenicity within normal limits. No mass or hydronephrosis visualized.  Abdominal aorta:  No aneurysm visualized, maximal diameter 2.1 cm.  Other findings:  None.  IMPRESSION: Nonmobile gallstone.  Otherwise unremarkable abdominal ultrasound.   Electronically Signed   By: Salome Holmes M.D.   On: 07/20/2013 10:08   Dg Abd Acute W/chest  07/20/2013   CLINICAL DATA:  Back pain, shortness of breath  EXAM: ACUTE ABDOMEN SERIES (ABDOMEN 2 VIEW & CHEST 1 VIEW)  COMPARISON:  None.  FINDINGS: There is no  evidence of dilated bowel loops or free intraperitoneal air. Bowel content is noted throughout colon. No radiopaque calculi or other significant radiographic abnormality is seen. Heart  size and mediastinal contours are within normal limits. There is no focal infiltrate, pulmonary edema, or pleural effusion. There is a small calcified granuloma in the left mid lung. There is scoliosis and degenerative joint changes of the spine. Patient is status post prior fixation of the lower lumbar spine.  IMPRESSION: Negative abdominal radiographs. Constipation. No acute cardiopulmonary disease.   Electronically Signed   By: Sherian Rein M.D.   On: 07/20/2013 09:26    Pertinent items are noted in HPI. Blood pressure 150/53, pulse 93, temperature 98 F (36.7 C), temperature source Oral, resp. rate 18, height 5\' 2"  (1.575 m), weight 95.255 kg (210 lb), SpO2 97.00%. The patient is awake and alert. She is oriented and appropriate. Her motor function is intact bilaterally. Sensory examination is nonfocal. Straight raising is positive bilaterally. Deep versus are hypoactive in both lower trimming his. Achilles reflexes are absent bilaterally. There is no with long track signs. Lumbar spine is mildly tender. Postoperative changes are present. Examination head ears eyes further is unremarkable. Chest and abdomen are currently benign. Extremities are free from injury deformity.  Assessment/Plan: L3-4 stenosis with large central herniated pulposus causing severe back pain and neurogenic claudication. Given the degree of stenosis I do not think that simple nonoperative management will be effective. I've discussed options available for management including the possibility of moving forward with L3-L4 decompressive laminectomy and microdiscectomy. I've discussed the risks and benefits involved with surgery including but not limited to the risk of anesthesia, bleeding, infection, CSF leak, nerve root injury, later instability,  continued pain, and non-benefit. The patient has been given the opportunity to ask questions. She appears to understand. She wishes to proceed with surgery. Because of the patient's aspirin use it would be ideal to wait at least 72 hours before performing surgery. Given The fact that she that has no motor weakness I think it would be fine for surgery to be delayed until Monday. Should the patient's symptoms allow it would be fine for her to be discharged home and readmitted on Monday for planned surgery.  Davian Wollenberg A 07/20/2013, 5:35 PM

## 2013-07-20 NOTE — ED Provider Notes (Signed)
CSN: [id number]     Arrival date & time 07/20/13  0800 History   First MD Initiated Contact with Patient 07/20/13 [phone]     Chief Complaint  Patient presents with  . Back Pain   (Consider location/radiation/quality/duration/timing/severity/associated sxs/prior Treatment) HPI Comments: Two-month history of lower back pain that has been gradually worsening. Unable to get out of bed today. Denies any falls or trauma. Started after being hospitalized in August for TIAs. Previous back surgery 1999. Head MRI yesterday but does not know results. Pain is in her low back and radiates down both legs with generalized weakness. Denies any bowel or bladder incontinence. No fevers or vomiting. No history of cancer. Taking Percocet at home without relief. Denies abdominal pain, nausea or vomiting.  The history is provided by the patient and a relative.    Past Medical History  Diagnosis Date  . Diabetes   . Hypertension   . CHF (congestive heart failure)   . Stroke     August 2014, but has had strokes prior as well  . Cancer     Reports lumpectomy for a cyst  . Renal disorder    Past Surgical History  Procedure Laterality Date  . Abdominal hysterectomy      Patient not clear as to why  . Back surgery    . Hand surgery Right     Carpel tunnel release in the 1970s   No family history on file. History  Substance Use Topics  . Smoking status: Never Smoker   . Smokeless tobacco: Not on file  . Alcohol Use: No   OB History   Grav Para Term Preterm Abortions TAB SAB Ect Mult Living                 Review of Systems  Constitutional: Negative for fever, activity change and appetite change.  Respiratory: Negative for cough, chest tightness and shortness of breath.   Cardiovascular: Negative for chest pain.  Gastrointestinal: Negative for nausea, vomiting and abdominal pain.  Genitourinary: Negative for dysuria and hematuria.  Musculoskeletal: Positive for back pain. Negative for arthralgias.   Skin: Negative for rash.  Neurological: Positive for weakness. Negative for dizziness and headaches.  A complete 10 system review of systems was obtained and all systems are negative except as noted in the HPI and PMH.    Allergies  Review of patient's allergies indicates no known allergies.  Home Medications   No current outpatient prescriptions on file. BP 150/53  Pulse 93  Temp(Src) 98 F (36.7 C) (Oral)  Resp 18  Ht 5\' 2"  (1.575 m)  Wt 210 lb (95.255 kg)  BMI 38.40 kg/m2  SpO2 97% Physical Exam  Constitutional: She is oriented to person, place, and time. She appears well-developed and well-nourished. No distress.  HENT:  Head: Normocephalic and atraumatic.  Mouth/Throat: Oropharynx is clear and moist. No oropharyngeal exudate.  Eyes: Conjunctivae and EOM are normal. Pupils are equal, round, and reactive to light.  Neck: Normal range of motion. Neck supple.  Cardiovascular: Normal rate, regular rhythm and normal heart sounds.   Pulmonary/Chest: Effort normal and breath sounds normal. No respiratory distress.  Abdominal: Soft. There is no tenderness. There is no rebound and no guarding.  Musculoskeletal: Normal range of motion. She exhibits tenderness. She exhibits no edema.  TTP lumbar spine in midline, no stepoffs.  5/5 strength in bilateral lower extremities. Ankle plantar and dorsiflexion intact. Great toe extension intact bilaterally. +2 DP and PT pulses. +2 patellar reflexes bilaterally.  Neurological: She is alert and oriented to person, place, and time. No cranial nerve deficit. She exhibits normal muscle tone. Coordination normal.  Skin: Skin is warm.    ED Course  Procedures (including critical care time) Labs Review Labs Reviewed  CBC WITH DIFFERENTIAL - Abnormal; Notable for the following:    RBC 3.70 (*)    Hemoglobin 10.8 (*)    HCT 31.3 (*)    All other components within normal limits  COMPREHENSIVE METABOLIC PANEL - Abnormal; Notable for the  following:    Sodium 124 (*)    Chloride 87 (*)    Glucose, Bld 179 (*)    BUN 26 (*)    Creatinine, Ser 1.43 (*)    GFR calc non Af Amer 33 (*)    GFR calc Af Amer 39 (*)    All other components within normal limits  GLUCOSE, CAPILLARY - Abnormal; Notable for the following:    Glucose-Capillary 180 (*)    All other components within normal limits  BASIC METABOLIC PANEL  OSMOLALITY  OSMOLALITY, URINE  SODIUM, URINE, RANDOM   Imaging Review US Abdomen Complete  07/20/2013   CLINICAL DATA:  Back pain  EXAM: ULTRASOUND ABDOMEN COMPLETE  COMPARISON:  None.  FINDINGS: Gallbladder:  A nonmobile 1.4 cm gallstone appreciated. There is no evidence of wall thickening, measuring 2.7 mm. There is no evidence of a sonographic Murphy's sign no pericholecystic fluid.  Common bile duct:  Diameter: 4.5 mm  Liver:  No focal lesion identified. Within normal limits in parenchymal echogenicity.  IVC:  No abnormality visualized.  Pancreas:  Visualized portion unremarkable.  Spleen:  Multiple punctate echogenic foci with reverberation artifact identified within the spleen indicative of multiple calcifications reflecting calcified granulomas. Otherwise unremarkable.  Right Kidney:  Length: 9.6 cm. Echogenicity within normal limits. No mass or hydronephrosis visualized.  Left Kidney:  Length: 10 cm. Echogenicity within normal limits. No mass or hydronephrosis visualized.  Abdominal aorta:  No aneurysm visualized, maximal diameter 2.1 cm.  Other findings:  None.  IMPRESSION: Nonmobile gallstone.  Otherwise unremarkable abdominal ultrasound.   Electronically Signed   By: Salome Holmes M.D.   On: 07/20/2013 10:08   Dg Abd Acute W/chest  07/20/2013   CLINICAL DATA:  Back pain, shortness of breath  EXAM: ACUTE ABDOMEN SERIES (ABDOMEN 2 VIEW & CHEST 1 VIEW)  COMPARISON:  None.  FINDINGS: There is no evidence of dilated bowel loops or free intraperitoneal air. Bowel content is noted throughout colon. No radiopaque calculi  or other significant radiographic abnormality is seen. Heart size and mediastinal contours are within normal limits. There is no focal infiltrate, pulmonary edema, or pleural effusion. There is a small calcified granuloma in the left mid lung. There is scoliosis and degenerative joint changes of the spine. Patient is status post prior fixation of the lower lumbar spine.  IMPRESSION: Negative abdominal radiographs. Constipation. No acute cardiopulmonary disease.   Electronically Signed   By: Sherian Rein M.D.   On: 07/20/2013 09:26    EKG Interpretation    Date/Time:    Ventricular Rate:    PR Interval:    QRS Duration:   QT Interval:    QTC Calculation:   R Axis:     Text Interpretation:              MDM   1. Back pain   2. Hyponatremia    2 months of worsening back pain without trauma. No fall. MRI done yesterday. No bowel or  bladder incontinence. No lateralizing weakness.  MRI of lumbar spine obtained from [place]. Shows uncomplicated L4-L5 cage fusion. Severe L3-L4 segment disease, severe spinal, lateral recess and bilateral foraminal stenosis. Moderate L2-L3 degenerative disease and severe L5-S1 degenerative disease.  MRI results discussed with Dr. Dutch Quint. Multilevel spinal stenosis and foraminal stenosis. Patient remained in significant pain despite multiple doses of narcotics. She is able to ambulate a few steps with assistance. Giving her ongoing pain difficulty with ambulation, she will need admission for pain control and likely physical therapy. Dr. Dutch Quint states he'll see her later today. Incidental hyponatremia of 124. Suspect hypovolemic hyponatremia and will start gentle hydration.  Glynn Octave, MD 07/20/13 [phone]

## 2013-07-20 NOTE — H&P (Signed)
Date: 07/20/2013               Patient Name:  Kristen Barron MRN: [id number]  DOB: 1929-03-22 Age / Sex: 79 y.o., female   PCP: Dr. Hillery Aldo [phone]) [facility]         Medical Service: Internal Medicine Teaching Service         Attending Physician: Dr. Dalphine Handing    First Contact: Dr. Claudell Kyle Pager: [phone]  Second Contact: Dr. Garald Braver Pager: [phone]       After Hours (After 5p/  First Contact Pager: [phone]  weekends / holidays): Second Contact Pager: [phone]   Chief Complaint: Back Pain  History of Present Illness:  Kristen Barron is an 79 yo woman with history of HTN, CHF (echo not on file here or at PCP office), CVA and back surgery (L4-L5 cage fusion) who presents to [facility] on 07/20/13 due to worsening, chronic low back pain that shoots down both legs.  She notes that she has had progressive back pain since hospital discharge in August (for CVA at [facility]) that has prompted evaluation at the [facility] ED, Urgent care, her PCP and an orthopedist.  She underwent lumbar MRI yesterday, after which pain began to significantly worsen to the point that she could not get out of bed this morning (due to pain, not weakness).  She lives in [place] with her grand daughter, who brought her to the hospital.  She describes pain as intermittent, sharp, shooting, 10/10.  She also notes low back muscle spasms.  No alleviating or exacerbating factors.  She reports dilaudid given in the ED has not helped, and has possibly made pain worse.  She denies urinary or bowel incontinence.  No falls secondary to pain. No numbness/tingling.  No fever/chills.   In the ED she was given Dilaudid 0.5mg  x2.  After my evaluation, she was subsequently given ketorlac 30mg  IV, and additional 0.5mg  dilaudid IV and valium 2mg .  Meds: Current Facility-Administered Medications  Medication Dose Route Frequency Provider Last Rate Last Dose  . 0.9 %  sodium chloride infusion   Intravenous Once  Glynn Octave, MD       Medication Sig   . amLODipine (NORVASC) 5 MG tablet Take 5 mg by mouth daily.    Marland Kitchen amoxicillin-clavulanate (AUGMENTIN) 875-125 MG per tablet Take 1 tablet by mouth 2 (two) times daily. For 7 days. Started 07/15/13  For UTI, has 4 doses left  . aspirin EC 81 MG tablet Take 81 mg by mouth daily.    . Calcium-Magnesium-Vitamin D (CALCIUM 500 PO) Take 500 mg by mouth daily.    . clopidogrel (PLAVIX) 75 MG tablet Take 75 mg by mouth daily with breakfast.    . furosemide (LASIX) 20 MG tablet Take 20 mg by mouth 2 (two) times daily.    Marland Kitchen glipiZIDE (GLUCOTROL XL) 10 MG 24 hr tablet Take 10 mg by mouth 2 (two) times daily.    . hydrALAZINE (APRESOLINE) 25 MG tablet Take 25 mg by mouth 3 (three) times daily.    . metoprolol succinate (TOPROL-XL) 100 MG 24 hr tablet Take 100 mg by mouth daily. Take with or immediately following a meal.    . omega-3 acid ethyl esters (LOVAZA) 1 G capsule Take 1 g by mouth daily.    Marland Kitchen omeprazole (PRILOSEC) 20 MG capsule Take 20 mg by mouth daily.    Marland Kitchen oxybutynin (DITROPAN-XL) 5 MG 24 hr tablet Take 5-10 mg by mouth 2 (two) times  daily. One tablet in am and two tablets in pm    . oxyCODONE-acetaminophen (PERCOCET) 7.5-325 MG per tablet Take 1 tablet by mouth every 4 (four) hours as needed for pain.  Increased from 5-325 yesterday  . pravastatin (PRAVACHOL) 80 MG tablet Take 80 mg by mouth daily.    . quinapril (ACCUPRIL) 40 MG tablet Take 40 mg by mouth at bedtime.    . traZODone (DESYREL) 50 MG tablet Take 50-100 mg by mouth at bedtime as needed for sleep.    Levemir 10 u qHS  Allergies: Allergies as of 07/20/2013  . (No Known Allergies)   Past Medical History  Diagnosis Date  . Diabetes   . Hypertension   . CHF (congestive heart failure)   . Stroke     August 2014, but has had strokes prior as well  . Cancer     Reports lumpectomy for a cyst  . Renal disorder    Past Surgical History  Procedure Laterality Date  . Abdominal  hysterectomy      Patient not clear as to why  . Back surgery    . Hand surgery Right     Carpel tunnel release in the 1970s   No family history on file. History   Social History  . Marital Status: Divorced    Spouse Name: N/A    Number of Children: 5  . Years of Education: 11th   Occupational History  . 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 Smoking status: Never Smoker   . Smokeless tobacco: Not on file  . Alcohol Use: No  . Drug Use: No  . Sexual Activity: Not on file   Other Topics Concern  . Not on file   Social History Narrative   Lives with granddaughter but is independent and manages her own medications. Does not use a walker or cane.  Has 2 living children and 3 dead children.    Review of Systems: Constitutional: Denies fever, chills, diaphoresis, and fatigue. Decreased appetite due to pain HEENT: Denies photophobia, eye pain, redness, hearing loss, ear pain, congestion, sore throat, rhinorrhea, sneezing, mouth sores, trouble swallowing, neck pain, neck stiffness and tinnitus.  Respiratory: Denies SOB, cough, chest tightness, and wheezing. Chronic DOE, no change Cardiovascular: Denies chest pain, palpitations, +leg swelling Gastrointestinal: Denies abdominal pain, diarrhea, blood in stool and abdominal distention. Bilious emesis x 1 on Monday, no further episodes, but has felt nauseous; no BM since Tuesday Genitourinary: Denies dysuria, urgency, frequency, hematuria, flank pain and difficulty urinating.  Musculoskeletal: Denies joint swelling and gait problem. Does have h/o b/l knee arthritis for which she has gotten steroid injections Skin: Denies pallor, rash and wound.  Neurological: Denies dizziness, seizures, syncope, weakness, lightheadedness, numbness, tingling and headaches.   Physical Exam: Blood pressure 125/85, pulse 81, temperature 98.2 F (36.8 C), temperature source Oral, resp. rate 20, height 5\' 2"   (1.575 m), weight 210 lb (95.255 kg), SpO2 94.00%. General: resting in bed, moderate distress, appears as stated age HEENT: sluggish pupils b/l, EOMI, no scleral icterus Cardiac: RRR, no rubs, murmurs or gallops Pulm: clear to auscultation anteriorly (unable to listen to back, patient with increased pain on positional change), good air mvmt anteriorly Abd: soft, nontender, nondistended, BS normoactive Ext: warm and well perfused, 1+ pretibial edema, straight leg test positive b/l (sciatic pain reproduced b/l at about 60 degrees) Back: lumbar paraspinal muscular contractures Neuro: alert and oriented X3, cranial  nerves II-XII grossly intact, strength 5/5 b/l UE & LE   Lab results: Basic Metabolic Panel:  Recent Labs  44/01/02 0834  NA 124*  K 4.3  CL 87*  CO2 25  GLUCOSE 179*  BUN 26*  CREATININE 1.43*  CALCIUM 8.8  AG 12  Liver Function Tests:  Recent Labs  07/20/13 0834  AST 17  ALT 12  ALKPHOS 75  BILITOT 0.5  PROT 7.3  ALBUMIN 3.5   CBC:  Recent Labs  07/20/13 0834  WBC 6.4  NEUTROABS 4.8  HGB 10.8*  HCT 31.3*  MCV 84.6  PLT 229     Imaging results:  US Abdomen Complete 07/20/2013   CLINICAL DATA:  Back pain  EXAM: ULTRASOUND ABDOMEN COMPLETE  COMPARISON:  None.  FINDINGS: Gallbladder:  A nonmobile 1.4 cm gallstone appreciated. There is no evidence of wall thickening, measuring 2.7 mm. There is no evidence of a sonographic Murphy's sign no pericholecystic fluid.  Common bile duct:  Diameter: 4.5 mm  Liver:  No focal lesion identified. Within normal limits in parenchymal echogenicity.  IVC:  No abnormality visualized.  Pancreas:  Visualized portion unremarkable.  Spleen:  Multiple punctate echogenic foci with reverberation artifact identified within the spleen indicative of multiple calcifications reflecting calcified granulomas. Otherwise unremarkable.  Right Kidney:  Length: 9.6 cm. Echogenicity within normal limits. No mass or hydronephrosis visualized.   Left Kidney:  Length: 10 cm. Echogenicity within normal limits. No mass or hydronephrosis visualized.  Abdominal aorta:  No aneurysm visualized, maximal diameter 2.1 cm.  Other findings:  None.  IMPRESSION: Nonmobile gallstone.  Otherwise unremarkable abdominal ultrasound.   Electronically Signed   By: Salome Holmes M.D.   On: 07/20/2013 10:08   Dg Abd Acute W/chest 07/20/2013   CLINICAL DATA:  Back pain, shortness of breath  EXAM: ACUTE ABDOMEN SERIES (ABDOMEN 2 VIEW & CHEST 1 VIEW)  COMPARISON:  None.  FINDINGS: There is no evidence of dilated bowel loops or free intraperitoneal air. Bowel content is noted throughout colon. No radiopaque calculi or other significant radiographic abnormality is seen. Heart size and mediastinal contours are within normal limits. There is no focal infiltrate, pulmonary edema, or pleural effusion. There is a small calcified granuloma in the left mid lung. There is scoliosis and degenerative joint changes of the spine. Patient is status post prior fixation of the lower lumbar spine.  IMPRESSION: Negative abdominal radiographs. Constipation. No acute cardiopulmonary disease.   Electronically Signed   By: Sherian Rein M.D.   On: 07/20/2013 09:26    Assessment & Plan by Problem: Kristen Barron is a 79 year old woman with history of HTN, CHF, DM, CKD, and DDD who is admitted on 07/20/13 with worsening low back pain.  # Spinal stenosis of lumbar region/DDD: Lumbar MRI done at [place] (07/19/13) reveals severe L3-L4 adjacent segment disease, severe spinal lateral recess and moderate bilateral foraminal stenosis; moderate L2-L3 degenerative disease with mild central and right lateral recess stenosis, mild bilateral foraminal stensosis; and L5-S1 severe degenerative disease with mild central stenosis and R>L lateral recess stenosis; left foraminal stenosis potentially affects the left L5 nerve.  Notably, no history of incontinence and no weakness on exam. -Admit to inpatient (med-surg)  for IV pain mgmt -Pain regimen will include Dilaudid 0.5mg  q4h prn (increase if needed), cyclobenzaprine 5mg  daily, gabapentin 200mg  daily (renally dosed); also, may use NSAIDs cautiously, as renal fxn seems to be at baseline, may consider steroid treatment if no relief  -Dr. Jordan Likes of neurosurgery  to see patient -Will consider PT/OT consult once neurosurgery has evaluated patient -Of note, she follows with Dr. Martha Clan at [facility] [phone]) -Could recent MRIs be contributing to misalignment of cage?  # Hyponatremia: per family, patient was hospitalized for 9 days after stroke due to hyponatremia (as low as 111?); records from PCP reveal sodium of 133 on 04/22/13 (stroke hospitalization was August); I suspect she exhibits hypovolemic hyponatremia given decreased PO intake, recent nausea/vomiting and hypochloremia.  She has already received 1L NS in the ED.  Unclear how long this has been going on, but will remain cautious and avoid correction of >10 in 24h.  She has history of renal failure, which may be contributing.  No history of liver cirrhosis. Not overly hyperglycemic. No history of thyroid (TSH 2.19, 01/18/12) or adrenal disease. Except for mild LE edema, no s/s of fluid overload to suggest HF etiology.   -Start with serum osm, urine osm, urine Na (has not taken lasix today) -Hold lasix and check Na at 8p -Orthostatic vital signs if able  #Recent UTI: Patient to complete Augmentin tomorrow (prescribed x7d total; had been started on cipro prior, but was resistant)  #CKD 3: Cr appears at baseline today, as Cr = 1.31 at PCP visit on 04/22/13; follows with Dr. Earnstine Regal of [facility] -Monitor fluid status and renal function -NSAID use cautiously  #DM: A1c 8.2 on 03/23/13 per PCP records.  Home regimen includes glipizide and levemir 10u qHS -Decrease levemir to 5u qHS given decreased PO intake -SSI  # Hypertension: Normotensive at admission -continue home amlodipine,  hydralazine, metoprolol and quinapril  # Normocytic anemia: Hb appears to be at baseline (Hb 10 on 01/18/12, B12 and folate wnl), likely related to CKD -Cont to monitor  #h/o CVA: cont statin, ASA, plavix  #H/o CHF: unclear if systolic or diastolic; she is on BB & ACEI -Cont BB & ACEI -Hold lasix as above  #VTE ppx: heparin TID  #Code status: full code   Dispo: Disposition is deferred at this time, awaiting improvement of current medical problems. Anticipated discharge in approximately 2-3 day(s).   The patient does have a current PCP ([facility] - Dr. Hillery Aldo) and does not need an [facility] hospital follow-up appointment after discharge.  The patient does not have transportation limitations that hinder transportation to clinic appointments.  Signed: Belia Heman, MD 07/20/2013, 12:04 PM

## 2013-07-21 LAB — OSMOLALITY: Osmolality: 270 mOsm/kg — ABNORMAL LOW (ref 275–300)

## 2013-07-21 LAB — BASIC METABOLIC PANEL
BUN: 19 mg/dL (ref 6–23)
CO2: 26 mEq/L (ref 19–32)
CO2: 23 mEq/L (ref 19–32)
Calcium: 8.3 mg/dL — ABNORMAL LOW (ref 8.4–10.5)
Calcium: 8.3 mg/dL — ABNORMAL LOW (ref 8.4–10.5)
Creatinine, Ser: 1.45 mg/dL — ABNORMAL HIGH (ref 0.50–1.10)
Creatinine, Ser: 1.54 mg/dL — ABNORMAL HIGH (ref 0.50–1.10)
GFR calc Af Amer: 38 mL/min — ABNORMAL LOW (ref >90)
GFR calc non Af Amer: 33 mL/min — ABNORMAL LOW (ref >90)
Glucose, Bld: 164 mg/dL — ABNORMAL HIGH (ref 70–99)
Sodium: 126 mEq/L — ABNORMAL LOW (ref 135–145)
Sodium: 125 mEq/L — ABNORMAL LOW (ref 135–145)

## 2013-07-21 LAB — GLUCOSE, CAPILLARY: Glucose-Capillary: 145 mg/dL — ABNORMAL HIGH (ref 70–99)

## 2013-07-21 MED ORDER — SODIUM CHLORIDE 0.9 % IV SOLN
INTRAVENOUS | Status: AC
  Administered 2013-07-21: 04:00:00 via INTRAVENOUS

## 2013-07-21 MED ORDER — SODIUM CHLORIDE 0.9 % IV SOLN: INTRAVENOUS | Status: DC

## 2013-07-21 MED ORDER — SODIUM CHLORIDE 0.9 % IV SOLN
INTRAVENOUS | Status: DC
  Administered 2013-07-21: 21:00:00 via INTRAVENOUS
  Administered 2013-07-22: 1000 mL via INTRAVENOUS
  Administered 2013-07-23 (×2): via INTRAVENOUS

## 2013-07-21 MED ORDER — METHOCARBAMOL 500 MG PO TABS
500.0000 mg | ORAL_TABLET | Freq: Every evening | ORAL | Status: DC | PRN
  Administered 2013-07-21: 500 mg via ORAL
  Filled 2013-07-21: qty 1

## 2013-07-21 MED ORDER — HYDROMORPHONE HCL PF 1 MG/ML IJ SOLN
1.0000 mg | INTRAMUSCULAR | Status: DC | PRN
  Administered 2013-07-21 – 2013-07-24 (×12): 1 mg via INTRAVENOUS
  Filled 2013-07-21 (×12): qty 1

## 2013-07-21 NOTE — Progress Notes (Signed)
Subjective: Patient seen and examined at the bedside. She is still in some pain, located in her lower back and radiating to the legs. No weakness, numbness.   Objective: Vital signs in last 24 hours: Filed Vitals:   07/20/13 1756 07/20/13 2219 07/21/13 0233 07/21/13 0500  BP: 137/64 140/42 165/57   Pulse: 74 72 78   Temp: 98.4 F (36.9 C) 98.4 F (36.9 C) 97.8 F (36.6 C)   TempSrc: Oral Oral Oral   Resp: 18 20 20    Height:      Weight:    222 lb (100.699 kg)  SpO2: 95% 95% 96%    Weight change:   Intake/Output Summary (Last 24 hours) at 07/21/13 0719 Last data filed at 07/21/13 1610  Gross per 24 hour  Intake 966.67 ml  Output      0 ml  Net 966.67 ml   Physical Exam:  General: resting in bed, moderate distress, appears as stated age  HEENT: PERRL, EOMI, no scleral icterus  Cardiac: RRR, no rubs, murmurs or gallops  Pulm: clear to auscultation anteriorly (unable to listen to back, patient with increased pain on positional change), good air mvmt anteriorly  Abd: soft, nontender, nondistended, BS normoactive  Ext: warm and well perfused, 1+ pretibial edema, straight leg test positive b/l (sciatic pain reproduced b/l at about 60 degrees)  Back: lumbar paraspinal muscular contractures  Neuro: alert and oriented X3, cranial nerves II-XII grossly intact, strength 5/5 b/l UE & LE  Lab Results: Basic Metabolic Panel:  Recent Labs Lab 07/20/13 0834 07/20/13 1950  NA 124* 126*  K 4.3 4.6  CL 87* 93*  CO2 25 24  GLUCOSE 179* 137*  BUN 26* 24*  CREATININE 1.43* 1.56*  CALCIUM 8.8 8.3*   Liver Function Tests:  Recent Labs Lab 07/20/13 0834  AST 17  ALT 12  ALKPHOS 75  BILITOT 0.5  PROT 7.3  ALBUMIN 3.5   No results found for this basename: LIPASE, AMYLASE,  in the last 168 hours No results found for this basename: AMMONIA,  in the last 168 hours CBC:  Recent Labs Lab 07/20/13 0834  WBC 6.4  NEUTROABS 4.8  HGB 10.8*  HCT 31.3*  MCV 84.6  PLT 229    Cardiac Enzymes: No results found for this basename: CKTOTAL, CKMB, CKMBINDEX, TROPONINI,  in the last 168 hours BNP: No results found for this basename: PROBNP,  in the last 168 hours D-Dimer: No results found for this basename: DDIMER,  in the last 168 hours CBG:  Recent Labs Lab 07/20/13 1359 07/20/13 1725 07/20/13 2202  GLUCAP 180* 217* 124*   Hemoglobin A1C: No results found for this basename: HGBA1C,  in the last 168 hours Fasting Lipid Panel: No results found for this basename: CHOL, HDL, LDLCALC, TRIG, CHOLHDL, LDLDIRECT,  in the last 168 hours Thyroid Function Tests: No results found for this basename: TSH, T4TOTAL, FREET4, T3FREE, THYROIDAB,  in the last 168 hours Coagulation: No results found for this basename: LABPROT, INR,  in the last 168 hours Anemia Panel: No results found for this basename: VITAMINB12, FOLATE, FERRITIN, TIBC, IRON, RETICCTPCT,  in the last 168 hours Urine Drug Screen: Drugs of Abuse  No results found for this basename: labopia, cocainscrnur, labbenz, amphetmu, thcu, labbarb    Alcohol Level: No results found for this basename: ETH,  in the last 168 hours Urinalysis: No results found for this basename: COLORURINE, APPERANCEUR, LABSPEC, PHURINE, GLUCOSEU, HGBUR, BILIRUBINUR, KETONESUR, PROTEINUR, UROBILINOGEN, NITRITE, LEUKOCYTESUR,  in  the last 168 hours   Micro Results: No results found for this or any previous visit (from the past 240 hour(s)). Studies/Results: Dg Lumbar Spine Complete  07/20/2013   CLINICAL DATA:  Worsening lower back pain. Pain shoots down both legs for 3 months.  EXAM: LUMBAR SPINE - COMPLETE 4+ VIEW  COMPARISON:  07/20/2013  FINDINGS: Patient has Re cages at L4-5. There is significant disc height loss at L2-3, L3-4 associated with degenerative changes. Degenerative changes are also noted in the lower thoracic spine. There is no evidence for acute fracture or subluxation. No suspicious lytic or blastic lesions are  identified. Atherosclerotic change is identified in the abdominal aorta. Regional bowel gas pattern is nonobstructive.  IMPRESSION: 1. Postoperative changes. 2. Significant degenerative changes without evidence for acute abnormality   Electronically Signed   By: Rosalie Gums M.D.   On: 07/20/2013 21:46   US Abdomen Complete  07/20/2013   CLINICAL DATA:  Back pain  EXAM: ULTRASOUND ABDOMEN COMPLETE  COMPARISON:  None.  FINDINGS: Gallbladder:  A nonmobile 1.4 cm gallstone appreciated. There is no evidence of wall thickening, measuring 2.7 mm. There is no evidence of a sonographic Murphy's sign no pericholecystic fluid.  Common bile duct:  Diameter: 4.5 mm  Liver:  No focal lesion identified. Within normal limits in parenchymal echogenicity.  IVC:  No abnormality visualized.  Pancreas:  Visualized portion unremarkable.  Spleen:  Multiple punctate echogenic foci with reverberation artifact identified within the spleen indicative of multiple calcifications reflecting calcified granulomas. Otherwise unremarkable.  Right Kidney:  Length: 9.6 cm. Echogenicity within normal limits. No mass or hydronephrosis visualized.  Left Kidney:  Length: 10 cm. Echogenicity within normal limits. No mass or hydronephrosis visualized.  Abdominal aorta:  No aneurysm visualized, maximal diameter 2.1 cm.  Other findings:  None.  IMPRESSION: Nonmobile gallstone.  Otherwise unremarkable abdominal ultrasound.   Electronically Signed   By: Salome Holmes M.D.   On: 07/20/2013 10:08   Dg Abd Acute W/chest  07/20/2013   CLINICAL DATA:  Back pain, shortness of breath  EXAM: ACUTE ABDOMEN SERIES (ABDOMEN 2 VIEW & CHEST 1 VIEW)  COMPARISON:  None.  FINDINGS: There is no evidence of dilated bowel loops or free intraperitoneal air. Bowel content is noted throughout colon. No radiopaque calculi or other significant radiographic abnormality is seen. Heart size and mediastinal contours are within normal limits. There is no focal infiltrate,  pulmonary edema, or pleural effusion. There is a small calcified granuloma in the left mid lung. There is scoliosis and degenerative joint changes of the spine. Patient is status post prior fixation of the lower lumbar spine.  IMPRESSION: Negative abdominal radiographs. Constipation. No acute cardiopulmonary disease.   Electronically Signed   By: Sherian Rein M.D.   On: 07/20/2013 09:26   Medications: I have reviewed the patient's current medications. Scheduled Meds: . amLODipine  5 mg Oral Daily  . amoxicillin-clavulanate  1 tablet Oral BID  . cyclobenzaprine  5 mg Oral QHS  . gabapentin  200 mg Oral QHS  . heparin  5,000 Units Subcutaneous Q8H  . hydrALAZINE  25 mg Oral TID  . insulin aspart  0-15 Units Subcutaneous TID WC  . insulin detemir  5 Units Subcutaneous QHS  . lisinopril  40 mg Oral Daily  . metoprolol succinate  100 mg Oral Daily  . oxybutynin  10 mg Oral QHS  . oxybutynin  5 mg Oral Daily  . pantoprazole  40 mg Oral Daily  . polyethylene glycol  17 g Oral Daily  . pravastatin  80 mg Oral Daily   Continuous Infusions: . sodium chloride 100 mL/hr at 07/21/13 0417   PRN Meds:.HYDROmorphone (DILAUDID) injection Assessment/Plan: Kristen Barron is a 79 year old woman with history of HTN, CHF, DM, CKD, and DDD who is admitted on 07/20/13 with worsening low back pain.   #Spinal stenosis of lumbar region/DDD - Neurosurgery feels simple nonoperative management will not be effective. Patient would like to forward with L3-L4 decompressive laminectomy and microdiscectomy. Because of the her aspirin/Plavix use and lack of motor weakness, he would like to wait until Monday for the surgery. Should the patient's symptoms allow he feels it would be fine for her to be discharged home and readmitted on Monday for planned surgery. - Appreciate neurosurgery recs - Increasing Dilaudid to 1mg  q4h prn, continue cyclobenzaprine 5mg  daily, gabapentin 200mg  daily (renally dosed) - Called radiology >  since her spinal equipment is titanium it would not be affected/dislodged by magnetism of MRI  #Hyponatremia - Trend as below. Unclear how long this has been going on, but will remain cautious and avoid correction of >10 in 24h. Serum and urine osms (below, Lasix held for measurement) suggest primary polydipsia vs. Malnutrition vs. Beer-drinkers potomania.  - NS @100cc /hr - Serum osmolality > 270 (low) - Calculated osmolality > 268 - Osmolal gap > 2 (wnl) - Urine osmolality > 310 (low) - Urine sodium > 17 -Unable to obtain orthostatic vitals 2/2 pain  Sodium  Date Value Range Status  07/21/2013 126* 135 - 145 mEq/L Final  07/20/2013 126* 135 - 145 mEq/L Final  07/20/2013 124* 135 - 145 mEq/L Final    #Recent UTI - We completed her course of Augmentin. No dysuria.   #CKD 3 - Cr appears near baseline, Cr = 1.31 at PCP visit on 04/22/13. - Continue to monitor fluid status and renal function  - NSAID use cautiously   Creatinine, Ser  Date Value Range Status  07/21/2013 1.45* 0.50 - 1.10 mg/dL Final  16/05/9603 5.40* 0.50 - 1.10 mg/dL Final  98/06/9146 8.29* 0.50 - 1.10 mg/dL Final    #DM - F6O 8.2 on 03/23/13 per PCP records. Home regimen includes glipizide and levemir 10u qHS. - Decrease levemir to 5u qHS given decreased PO intake  - SSI   #Hypertension - Well controlled. - Continue home amlodipine, hydralazine, metoprolol and quinapril   #Normocytic anemia - Hb appears to be at baseline (Hb 10 on 01/18/12, B12 and folate wnl), likely related to CKD. - Continue to monitor   #h/o CVA - Continue statin, will temporarily stop ASA, plavix for planned surgery.  #H/o CHF - Unclear if systolic or diastolic; she is on BB & ACEI at home. - Cont BB & ACEI  - Hold lasix as above   #VTE ppx - heparin TID   #Code status - full code   Dispo: Disposition is deferred at this time, awaiting improvement of current medical problems.  Anticipated discharge in approximately 1-3 day(s).    The patient does have a current PCP ([facility]) and does need an [facility] hospital follow-up appointment after discharge.  The patient does not have transportation limitations that hinder transportation to clinic appointments.  .Services Needed at time of discharge: Y = Yes, Blank = No PT:   OT:   RN:   Equipment:   Other:     LOS: 1 day   Kristen Barrack, MD 07/21/2013, 7:19 AM

## 2013-07-21 NOTE — Progress Notes (Signed)
The patient continues to have severe lumbar pain with radiation into her lower extremities.  She is afebrile. Her vitals are stable.  Severe stenosis at L3-4 secondary to adjacent level disc degeneration and disc herniation. Plan decompressive surgery on Monday. I stopped her aspirin and Plavix yesterday.

## 2013-07-21 NOTE — H&P (Signed)
Internal Medicine Attending Admission Note Date: 07/21/2013  Patient name: Kristen Barron Medical record number: [id number] Date of birth: June 06, 1929 Age: 79 y.o. Gender: female  I saw and evaluated the patient. I reviewed the resident's note and I agree with the resident's findings and plan as documented in the resident's note.  Briefly, Kristen Barron is an 79 year old woman with a history of previous back surgery that is described as an L4-L5 cage fusion, cardiomyopathy, hypertension, and CVA who presents with 4 months of progressive back pain described as intermittent, sharp, shooting, with occasional back spasms. She does not know of any alleviating factors but notes that movement makes it worse. As the pain had reached a level of 10 over 10 she presented to the emergency department for further evaluation. She was admitted to the internal medicine teaching service for pain control. This morning on rounds she continues to have pain but denies any weakness or changes in her bowel or bladder habits.  An MRI of the lumbar spine at [facility] demonstrated severe stenosis at L3-4 secondary to adjacent level disc degeneration and disc herniation. Neurosurgery was consulted and they felt conservative medical management would not alleviate her symptoms. She is therefore scheduled for decompressive surgery on Monday. Since she is on aspirin and Plavix this has been stopped so that surgery can take place next week. We will increase the Dilantin dose as needed as well as continue the Flexeril for her muscle spasms. These medications will be titrated over the weekend pending her decompressive surgery on Monday.

## 2013-07-21 NOTE — Progress Notes (Signed)
Patient complained of severe spasms to bilateral lower legs. MD notified and order placed.

## 2013-07-22 LAB — BASIC METABOLIC PANEL
BUN: 12 mg/dL (ref 6–23)
CO2: 24 mEq/L (ref 19–32)
Calcium: 8.2 mg/dL — ABNORMAL LOW (ref 8.4–10.5)
Chloride: 95 mEq/L — ABNORMAL LOW (ref 96–112)
Chloride: 99 mEq/L (ref 96–112)
Creatinine, Ser: 1.29 mg/dL — ABNORMAL HIGH (ref 0.50–1.10)
Creatinine, Ser: 1.14 mg/dL — ABNORMAL HIGH (ref 0.50–1.10)
GFR calc Af Amer: 51 mL/min — ABNORMAL LOW (ref >90)
GFR calc non Af Amer: 38 mL/min — ABNORMAL LOW (ref >90)
Glucose, Bld: 183 mg/dL — ABNORMAL HIGH (ref 70–99)
Glucose, Bld: 110 mg/dL — ABNORMAL HIGH (ref 70–99)
Sodium: 128 mEq/L — ABNORMAL LOW (ref 135–145)
Sodium: 130 mEq/L — ABNORMAL LOW (ref 135–145)

## 2013-07-22 LAB — URINALYSIS, ROUTINE W REFLEX MICROSCOPIC
Bilirubin Urine: NEGATIVE
Hgb urine dipstick: NEGATIVE
Nitrite: NEGATIVE
Protein, ur: NEGATIVE mg/dL
Urobilinogen, UA: 0.2 mg/dL (ref 0.0–1.0)
pH: 6 (ref 5.0–8.0)

## 2013-07-22 LAB — GLUCOSE, CAPILLARY: Glucose-Capillary: 157 mg/dL — ABNORMAL HIGH (ref 70–99)

## 2013-07-22 LAB — URINE MICROSCOPIC-ADD ON

## 2013-07-22 MED ORDER — METHOCARBAMOL 500 MG PO TABS
500.0000 mg | ORAL_TABLET | Freq: Four times a day (QID) | ORAL | Status: DC | PRN
  Administered 2013-07-22 – 2013-07-25 (×4): 500 mg via ORAL
  Filled 2013-07-22 (×3): qty 1

## 2013-07-22 NOTE — Progress Notes (Signed)
No change in status. Patient still with intermittently severe back pain with radiation into her thighs and legs.  She is afebrile. She is awake and alert. She is oriented and appropriate. She still appears very uncomfortable.  Patient with severe spinal stenosis at L3-4. Plan lumbar decompressive surgery on Monday. Continue efforts at pain control.

## 2013-07-22 NOTE — Progress Notes (Signed)
Subjective: She has persistent lower back pain that radiates to her back. She could not get up to void last night and had difficulty voiding this morning with PVR of >700cc requiring in and out cath.   Objective: Vital signs in last 24 hours: Filed Vitals:   07/21/13 1800 07/21/13 2230 07/22/13 0700 07/22/13 1017  BP: 138/38 146/50 148/67 180/55  Pulse: 75 76 77 73  Temp: 98.5 F (36.9 C) 99.1 F (37.3 C) 98.6 F (37 C) 97.4 F (36.3 C)  TempSrc: Oral Oral Oral Oral  Resp: 18 18 18 18   Height:      Weight:      SpO2: 95% 94% 94% 96%   Weight change:   Intake/Output Summary (Last 24 hours) at 07/22/13 1048 Last data filed at 07/22/13 0645  Gross per 24 hour  Intake    240 ml  Output    850 ml  Net   -610 ml   Physical Exam:  General: resting in bed, in NAD HEENT: no scleral icterus, MM Cardiac: RRR, no rubs, murmurs or gallops  Pulm: clear to auscultation anteriorly (unable to listen to back, patient with increased pain on positional change), no respiratory distress Abd: soft, nontender, nondistended, BS normoactive, no suprapubic tenderness Ext: warm and well perfused, 1+ pretibial edema Back: lumbar paraspinal tenderness Neuro: alert and oriented X3, cranial nerves II-XII grossly intact, strength 5/5 b/l UE & LE  Lab Results: Basic Metabolic Panel:  Recent Labs Lab 07/21/13 1853 07/22/13 0820  NA 125* 128*  K 4.9 4.7  CL 92* 95*  CO2 23 22  GLUCOSE 164* 183*  BUN 19 15  CREATININE 1.54* 1.29*  CALCIUM 8.3* 8.2*   Liver Function Tests:  Recent Labs Lab 07/20/13 0834  AST 17  ALT 12  ALKPHOS 75  BILITOT 0.5  PROT 7.3  ALBUMIN 3.5   CBC:  Recent Labs Lab 07/20/13 0834  WBC 6.4  NEUTROABS 4.8  HGB 10.8*  HCT 31.3*  MCV 84.6  PLT 229   CBG:  Recent Labs Lab 07/20/13 2202 07/21/13 0716 07/21/13 1154 07/21/13 1636 07/21/13 2105 07/22/13 0641  GLUCAP 124* 141* 117* 158* 145* 157*    Studies/Results: Dg Lumbar Spine  Complete  07/20/2013   CLINICAL DATA:  Worsening lower back pain. Pain shoots down both legs for 3 months.  EXAM: LUMBAR SPINE - COMPLETE 4+ VIEW  COMPARISON:  07/20/2013  FINDINGS: Patient has Re cages at L4-5. There is significant disc height loss at L2-3, L3-4 associated with degenerative changes. Degenerative changes are also noted in the lower thoracic spine. There is no evidence for acute fracture or subluxation. No suspicious lytic or blastic lesions are identified. Atherosclerotic change is identified in the abdominal aorta. Regional bowel gas pattern is nonobstructive.  IMPRESSION: 1. Postoperative changes. 2. Significant degenerative changes without evidence for acute abnormality   Electronically Signed   By: Rosalie Gums M.D.   On: 07/20/2013 21:46   Medications: I have reviewed the patient's current medications. Scheduled Meds: . amLODipine  5 mg Oral Daily  . gabapentin  200 mg Oral QHS  . heparin  5,000 Units Subcutaneous Q8H  . hydrALAZINE  25 mg Oral TID  . insulin aspart  0-15 Units Subcutaneous TID WC  . insulin detemir  5 Units Subcutaneous QHS  . lisinopril  40 mg Oral Daily  . metoprolol succinate  100 mg Oral Daily  . oxybutynin  10 mg Oral QHS  . oxybutynin  5 mg Oral Daily  .  pantoprazole  40 mg Oral Daily  . polyethylene glycol  17 g Oral Daily  . pravastatin  80 mg Oral Daily   Continuous Infusions: . sodium chloride 1,000 mL (07/22/13 0513)   PRN Meds:.HYDROmorphone (DILAUDID) injection, methocarbamol Assessment/Plan: Ms. Pfeffer is a 79 year old woman with history of HTN, CHF, DM, CKD, and DDD who is admitted on 07/20/13 with worsening low back pain.   #Spinal stenosis of lumbar region/DDD - Neurosurgery feels simple nonoperative management will not be effective. Patient would like to forward with L3-L4 decompressive laminectomy and microdiscectomy. Because of the her aspirin/Plavix use and lack of motor weakness, he would like to wait until Monday for the surgery.  Should the patient's symptoms allow he feels it would be fine for her to be discharged home and readmitted on Monday for planned surgery. - Appreciate neurosurgery recs - Continue Dilaudid to 1mg  q4h prn, continue cyclobenzaprine 5mg  daily, gabapentin 200mg  daily (renally dosed) - Called radiology > since her spinal equipment is titanium it would not be affected/dislodged by magnetism of MRI  #Hyponatremia - Trend as below. Unclear how long this has been going on, but will remain cautious and avoid correction of >10 in 24h. Serum and urine osms (below, Lasix held for measurement) suggest primary polydipsia vs. Malnutrition.  Na trended down overnight after IV access had been lost for and could not be restarted for a few hours.  - Increased NS to 125 ml/hr - Serum osmolality > 270 (low) - Calculated osmolality > 268 - Osmolal gap > 2 (wnl) - Urine osmolality > 310 (low) - Urine sodium > 17 -Unable to obtain orthostatic vitals 2/2 pain  Sodium  Date Value Range Status  07/22/2013 128* 135 - 145 mEq/L Final  07/21/2013 125* 135 - 145 mEq/L Final  07/21/2013 126* 135 - 145 mEq/L Final  07/20/2013 126* 135 - 145 mEq/L Final  07/20/2013 124* 135 - 145 mEq/L Final    #Recent UTI - We completed her course of Augmentin. No dysuria.   #CKD 3 - Baseline Cr of 1.31. Cr increased to 1.54 yesterday but down to baseline today.  - Continue to monitor fluid status and renal function  - NSAID use cautiously   Creatinine, Ser  Date Value Range Status  07/22/2013 1.29* 0.50 - 1.10 mg/dL Final  65/78/4696 2.95* 0.50 - 1.10 mg/dL Final  28/41/3244 0.10* 0.50 - 1.10 mg/dL Final  27/25/3664 4.03* 0.50 - 1.10 mg/dL Final    #DM - K7Q 8.2 on 03/23/13 per PCP records. Home regimen includes glipizide and levemir 10u qHS. - Decrease levemir to 5u qHS given decreased PO intake  - SSI   #Hypertension - BP trending up overnight, could be secondary to pain (she did not receive her pain medications during the  night). May go up on amlodipine if BP continues elevated despite pain medications.  - Continue home amlodipine, hydralazine, metoprolol and quinapril   #Normocytic anemia - Hb appears to be at baseline (Hb 10 on 01/18/12, B12 and folate wnl), likely related to CKD. - Continue to monitor   #h/o CVA - Continue statin, will temporarily stop ASA, plavix for planned surgery (last dose of Plavix 12/10 per patient's report)  #H/o CHF - Unclear if systolic or diastolic; she is on BB & ACEI at home. - Cont BB & ACEI  - Hold lasix as above   #VTE ppx - heparin TID   #Code status - full code   Dispo: Disposition is deferred at this time, awaiting  improvement of current medical problems.  Anticipated discharge in approximately 1-3 day(s).   The patient does have a current PCP ([facility]) and does need an [facility] hospital follow-up appointment after discharge.  The patient does not have transportation limitations that hinder transportation to clinic appointments.  .Services Needed at time of discharge: Y = Yes, Blank = No PT:   OT:   RN:   Equipment:   Other:     LOS: 2 days   Ky Barban, MD 07/22/2013, 10:48 AM

## 2013-07-23 LAB — GLUCOSE, CAPILLARY
Glucose-Capillary: 131 mg/dL — ABNORMAL HIGH (ref 70–99)
Glucose-Capillary: 134 mg/dL — ABNORMAL HIGH (ref 70–99)
Glucose-Capillary: 159 mg/dL — ABNORMAL HIGH (ref 70–99)

## 2013-07-23 MED ORDER — OXYCODONE-ACETAMINOPHEN 5-325 MG PO TABS
1.0000 | ORAL_TABLET | Freq: Four times a day (QID) | ORAL | Status: DC | PRN
  Administered 2013-07-23 – 2013-07-24 (×4): 2 via ORAL
  Filled 2013-07-23 (×4): qty 2

## 2013-07-23 MED ORDER — ALBUTEROL SULFATE (5 MG/ML) 0.5% IN NEBU: 2.5000 mg | INHALATION_SOLUTION | Freq: Four times a day (QID) | RESPIRATORY_TRACT | Status: DC | PRN

## 2013-07-23 MED ORDER — DEXTROSE 5 % IV SOLN
3.0000 g | INTRAVENOUS | Status: DC
  Filled 2013-07-23: qty 3000

## 2013-07-23 MED ORDER — OXYCODONE HCL 5 MG PO TABS: 5.0000 mg | ORAL_TABLET | Freq: Four times a day (QID) | ORAL | Status: DC | PRN

## 2013-07-23 NOTE — Progress Notes (Addendum)
Subjective: She has persistent lower back pain that radiates to her back. She has been voiding overnight and able to fully empty her bladder. She would like to try pain medications per mouth in addition to IV Dilaudid to provide more prolonged pain relief.    Objective: Vital signs in last 24 hours: Filed Vitals:   07/22/13 2039 07/23/13 0422 07/23/13 0817 07/23/13 0941  BP: 145/52 148/47 151/69 152/55  Pulse: 77 71 74   Temp: 97.5 F (36.4 C) 98 F (36.7 C) 98.5 F (36.9 C)   TempSrc: Oral Oral Oral   Resp: 19 20 20    Height:      Weight:  230 lb (104.327 kg)    SpO2: 93% 93% 96%    Weight change:   Intake/Output Summary (Last 24 hours) at 07/23/13 1248 Last data filed at 07/23/13 1151  Gross per 24 hour  Intake    360 ml  Output   2500 ml  Net  -2140 ml   Physical Exam:  General: resting in bed, in NAD HEENT: no scleral icterus, MM Cardiac: RRR, no rubs, murmurs or gallops  Pulm: bibasilar lung crackles, scattered respiratory wheezing, no respiratory distress Abd: soft, nontender, nondistended, BS normoactive, no suprapubic tenderness Ext: warm and well perfused, 1+ pretibial edema Back: lumbar paraspinal tenderness Neuro: alert and oriented X3, cranial nerves II-XII grossly intact, strength 5/5 b/l UE & LE  Lab Results: Basic Metabolic Panel:  Recent Labs Lab 07/22/13 0820 07/22/13 2025  NA 128* 130*  K 4.7 5.0  CL 95* 99  CO2 22 24  GLUCOSE 183* 110*  BUN 15 12  CREATININE 1.29* 1.14*  CALCIUM 8.2* 8.3*   Liver Function Tests:  Recent Labs Lab 07/20/13 0834  AST 17  ALT 12  ALKPHOS 75  BILITOT 0.5  PROT 7.3  ALBUMIN 3.5   CBC:  Recent Labs Lab 07/20/13 0834  WBC 6.4  NEUTROABS 4.8  HGB 10.8*  HCT 31.3*  MCV 84.6  PLT 229   CBG:  Recent Labs Lab 07/21/13 2105 07/22/13 0641 07/22/13 1159 07/22/13 2151 07/23/13 0600 07/23/13 1122  GLUCAP 145* 157* 172* 114* 131* 134*    Studies/Results: No results found. Medications: I have  reviewed the patient's current medications. Scheduled Meds: . amLODipine  5 mg Oral Daily  . gabapentin  200 mg Oral QHS  . heparin  5,000 Units Subcutaneous Q8H  . hydrALAZINE  25 mg Oral TID  . insulin aspart  0-15 Units Subcutaneous TID WC  . insulin detemir  5 Units Subcutaneous QHS  . lisinopril  40 mg Oral Daily  . metoprolol succinate  100 mg Oral Daily  . oxybutynin  10 mg Oral QHS  . oxybutynin  5 mg Oral Daily  . pantoprazole  40 mg Oral Daily  . polyethylene glycol  17 g Oral Daily  . pravastatin  80 mg Oral Daily   Continuous Infusions: . sodium chloride 125 mL/hr at 07/23/13 0235   PRN Meds:.albuterol, HYDROmorphone (DILAUDID) injection, methocarbamol, oxyCODONE, oxyCODONE-acetaminophen Assessment/Plan: Kristen Barron is a 79 year old woman with history of HTN, CHF, DM, CKD, and DDD who is admitted on 07/20/13 with worsening low back pain.   #Spinal stenosis of lumbar region/DDD - Neurosurgery feels simple nonoperative management will not be effective. Patient would like to forward with L3-L4 decompressive laminectomy and microdiscectomy. Because of the her aspirin/Plavix use and lack of motor weakness, he would like to wait until Monday for the surgery. Should the patient's symptoms allow he  feels it would be fine for her to be discharged home and readmitted on Monday for planned surgery. - Appreciate neurosurgery recs - Continue Dilaudid to 1mg  q4h prn, continue cyclobenzaprine 5mg  daily, gabapentin 200mg  daily (renally dosed) - Will start Percocet 5-325mg  1-2 tablets q6h PRN for more long term pain relief - Called radiology > since her spinal equipment is titanium it would not be affected/dislodged by magnetism of MRI  #Hyponatremia - Trend as below. Unclear how long this has been going on, but will remain cautious and avoid correction of >10 in 24h. Serum and urine osms (below, Lasix held for measurement) suggest primary polydipsia vs. Malnutrition.  Na appropriately and  gradually trending up.  - Continue NS but decreased to 30ml/hr given lung crackles, will discontinue fluids in the morning if Na continues to trend up - Serum osmolality > 270 (low) - Calculated osmolality > 268 - Osmolal gap > 2 (wnl) - Urine osmolality > 310 (low) - Urine sodium > 17 -Unable to obtain orthostatic vitals 2/2 pain  Sodium  Date Value Range Status  07/22/2013 130* 135 - 145 mEq/L Final  07/22/2013 128* 135 - 145 mEq/L Final  07/21/2013 125* 135 - 145 mEq/L Final  07/21/2013 126* 135 - 145 mEq/L Final  07/20/2013 126* 135 - 145 mEq/L Final    #Recent UTI - We completed her course of Augmentin. No dysuria.   #CKD 3 - Baseline Cr of 1.31. Cr increased to 1.54 yesterday but down to baseline.  - Continue to monitor fluid status and renal function  - NSAID use cautiously   Creatinine, Ser  Date Value Range Status  07/22/2013 1.14* 0.50 - 1.10 mg/dL Final  16/05/9603 5.40* 0.50 - 1.10 mg/dL Final  98/06/9146 8.29* 0.50 - 1.10 mg/dL Final  56/21/3086 5.78* 0.50 - 1.10 mg/dL Final    #DM - I6N 8.2 on 03/23/13 per PCP records. Home regimen includes glipizide and levemir 10u qHS. - Decrease levemir to 5u qHS given decreased PO intake  - SSI   #Hypertension - BP trending up overnight, could be secondary to pain (she did not receive her pain medications during the night). May go up on amlodipine if BP continues elevated despite pain medications.  - Continue home amlodipine, hydralazine, metoprolol and quinapril   #Normocytic anemia - Hb appears to be at baseline (Hb 10 on 01/18/12, B12 and folate wnl), likely related to CKD. - Continue to monitor   #h/o CVA - Continue statin, will temporarily stop ASA, plavix for planned surgery (last dose of Plavix 12/10 per patient's report)  #H/o CHF - Unclear if systolic or diastolic; she is on BB & ACEI at home. - Cont BB & ACEI  - Hold lasix as above   #VTE ppx - heparin TID   #Code status - full code   Dispo: Disposition  is deferred at this time, awaiting improvement of current medical problems.  Anticipated discharge in approximately 1-3 day(s).   The patient does have a current PCP ([facility]) and does need an [facility] hospital follow-up appointment after discharge.  The patient does not have transportation limitations that hinder transportation to clinic appointments.  .Services Needed at time of discharge: Y = Yes, Blank = No PT:   OT:   RN:   Equipment:   Other:     LOS: 3 days   Ky Barban, MD 07/23/2013, 12:48 PM

## 2013-07-23 NOTE — Progress Notes (Signed)
Patient ID: Kristen Barron, female   DOB: 18-Sep-1928, 79 y.o.   MRN: [id number] She is alert awake and oriented. Still complains of pain in the buttocks thighs and proximal lower extremities. Ambulation is been severely limited. Patient is preop for lumbar laminectomy tomorrow.

## 2013-07-24 ENCOUNTER — Encounter (HOSPITAL_COMMUNITY)
Admission: EM | Disposition: A | Discharge status: Home / Self Care | Payer: Self-pay | Source: Home / Self Care | Attending: Internal Medicine

## 2013-07-24 ENCOUNTER — Inpatient Hospital Stay (HOSPITAL_COMMUNITY): Payer: Medicare Other

## 2013-07-24 ENCOUNTER — Encounter (HOSPITAL_COMMUNITY): Payer: Medicare Other | Admitting: Anesthesiology

## 2013-07-24 ENCOUNTER — Encounter (HOSPITAL_COMMUNITY): Payer: Self-pay | Admitting: Anesthesiology

## 2013-07-24 ENCOUNTER — Inpatient Hospital Stay (HOSPITAL_COMMUNITY): Payer: Medicare Other | Admitting: Anesthesiology

## 2013-07-24 DIAGNOSIS — M48062 Spinal stenosis, lumbar region with neurogenic claudication: Secondary | ICD-10-CM | POA: Diagnosis present

## 2013-07-24 HISTORY — PX: LUMBAR LAMINECTOMY/DECOMPRESSION MICRODISCECTOMY: SHX5026

## 2013-07-24 LAB — GLUCOSE, CAPILLARY: Glucose-Capillary: 208 mg/dL — ABNORMAL HIGH (ref 70–99)

## 2013-07-24 LAB — CBC
MCH: 29.5 pg (ref 26.0–34.0)
MCHC: 34.2 g/dL (ref 30.0–36.0)
MCV: 86.4 fL (ref 78.0–100.0)
Platelets: 223 10*3/uL (ref 150–400)
RBC: 3.08 MIL/uL — ABNORMAL LOW (ref 3.87–5.11)
RDW: 14.8 % (ref 11.5–15.5)

## 2013-07-24 LAB — BASIC METABOLIC PANEL
BUN: 9 mg/dL (ref 6–23)
Calcium: 8.5 mg/dL (ref 8.4–10.5)
Creatinine, Ser: 1.21 mg/dL — ABNORMAL HIGH (ref 0.50–1.10)
GFR calc non Af Amer: 41 mL/min — ABNORMAL LOW (ref >90)
Glucose, Bld: 141 mg/dL — ABNORMAL HIGH (ref 70–99)
Sodium: 132 mEq/L — ABNORMAL LOW (ref 135–145)

## 2013-07-24 LAB — TYPE AND SCREEN: Antibody Screen: NEGATIVE

## 2013-07-24 SURGERY — LUMBAR LAMINECTOMY/DECOMPRESSION MICRODISCECTOMY
Anesthesia: General | Laterality: Bilateral

## 2013-07-24 MED ORDER — LACTATED RINGERS IV SOLN
INTRAVENOUS | Status: DC | PRN
  Administered 2013-07-24: 10:00:00 via INTRAVENOUS

## 2013-07-24 MED ORDER — GLYCOPYRROLATE 0.2 MG/ML IJ SOLN
INTRAMUSCULAR | Status: DC | PRN
  Administered 2013-07-24: 0.4 mg via INTRAVENOUS

## 2013-07-24 MED ORDER — SODIUM CHLORIDE 0.9 % IV SOLN: 250.0000 mL | INTRAVENOUS | Status: DC

## 2013-07-24 MED ORDER — OXYBUTYNIN CHLORIDE 5 MG PO TABS
5.0000 mg | ORAL_TABLET | Freq: Every day | ORAL | Status: DC
  Administered 2013-07-24 – 2013-07-26 (×3): 5 mg via ORAL
  Filled 2013-07-24 (×3): qty 1

## 2013-07-24 MED ORDER — FLEET ENEMA 7-19 GM/118ML RE ENEM: 1.0000 | ENEMA | Freq: Once | RECTAL | Status: AC | PRN

## 2013-07-24 MED ORDER — OXYBUTYNIN CHLORIDE 5 MG PO TABS
10.0000 mg | ORAL_TABLET | Freq: Every day | ORAL | Status: DC
  Administered 2013-07-25: 10 mg via ORAL
  Filled 2013-07-24 (×3): qty 2

## 2013-07-24 MED ORDER — ALBUTEROL SULFATE HFA 108 (90 BASE) MCG/ACT IN AERS
INHALATION_SPRAY | RESPIRATORY_TRACT | Status: DC | PRN
  Administered 2013-07-24: 2 via RESPIRATORY_TRACT
  Administered 2013-07-24 (×2): 1 via RESPIRATORY_TRACT

## 2013-07-24 MED ORDER — DEXAMETHASONE SODIUM PHOSPHATE 4 MG/ML IJ SOLN
INTRAMUSCULAR | Status: DC | PRN
  Administered 2013-07-24: 4 mg via INTRAVENOUS

## 2013-07-24 MED ORDER — FUROSEMIDE 10 MG/ML IJ SOLN
INTRAMUSCULAR | Status: DC | PRN
  Administered 2013-07-24: 10 mg via INTRAMUSCULAR

## 2013-07-24 MED ORDER — FENTANYL CITRATE 0.05 MG/ML IJ SOLN
INTRAMUSCULAR | Status: DC | PRN
  Administered 2013-07-24: 50 ug via INTRAVENOUS
  Administered 2013-07-24: 50 ug
  Administered 2013-07-24 (×2): 50 ug via INTRAVENOUS
  Administered 2013-07-24: 150 ug via INTRAVENOUS

## 2013-07-24 MED ORDER — SODIUM CHLORIDE 0.9 % IJ SOLN: 3.0000 mL | INTRAMUSCULAR | Status: DC | PRN

## 2013-07-24 MED ORDER — SODIUM CHLORIDE 0.9 % IR SOLN
Status: DC | PRN
  Administered 2013-07-24: 10:00:00

## 2013-07-24 MED ORDER — HYDROMORPHONE HCL PF 1 MG/ML IJ SOLN
0.2500 mg | INTRAMUSCULAR | Status: DC | PRN
  Administered 2013-07-24 (×2): 0.5 mg via INTRAVENOUS

## 2013-07-24 MED ORDER — PROMETHAZINE HCL 25 MG/ML IJ SOLN: 6.2500 mg | INTRAMUSCULAR | Status: DC | PRN

## 2013-07-24 MED ORDER — LIDOCAINE HCL (CARDIAC) 20 MG/ML IV SOLN
INTRAVENOUS | Status: DC | PRN
  Administered 2013-07-24: 25 mg via INTRAVENOUS

## 2013-07-24 MED ORDER — FUROSEMIDE 10 MG/ML IJ SOLN
10.0000 mg | INTRAMUSCULAR | Status: DC
  Filled 2013-07-24: qty 1

## 2013-07-24 MED ORDER — OXYCODONE HCL 5 MG/5ML PO SOLN: 5.0000 mg | Freq: Once | ORAL | Status: AC | PRN

## 2013-07-24 MED ORDER — SENNA 8.6 MG PO TABS
1.0000 | ORAL_TABLET | Freq: Two times a day (BID) | ORAL | Status: DC
  Administered 2013-07-24 – 2013-07-25 (×3): 8.6 mg via ORAL
  Filled 2013-07-24 (×5): qty 1

## 2013-07-24 MED ORDER — ACETAMINOPHEN 650 MG RE SUPP: 650.0000 mg | RECTAL | Status: DC | PRN

## 2013-07-24 MED ORDER — HYDROMORPHONE HCL PF 1 MG/ML IJ SOLN
INTRAMUSCULAR | Status: AC
  Filled 2013-07-24: qty 1

## 2013-07-24 MED ORDER — OXYCODONE HCL 5 MG PO TABS
5.0000 mg | ORAL_TABLET | Freq: Once | ORAL | Status: AC | PRN
  Administered 2013-07-24: 5 mg via ORAL

## 2013-07-24 MED ORDER — HEMOSTATIC AGENTS (NO CHARGE) OPTIME
TOPICAL | Status: DC | PRN
  Administered 2013-07-24: 1 via TOPICAL

## 2013-07-24 MED ORDER — MENTHOL 3 MG MT LOZG: 1.0000 | LOZENGE | OROMUCOSAL | Status: DC | PRN

## 2013-07-24 MED ORDER — ARTIFICIAL TEARS OP OINT
TOPICAL_OINTMENT | OPHTHALMIC | Status: DC | PRN
  Administered 2013-07-24: 1 via OPHTHALMIC

## 2013-07-24 MED ORDER — POLYETHYLENE GLYCOL 3350 17 G PO PACK
17.0000 g | PACK | Freq: Every day | ORAL | Status: DC | PRN
  Filled 2013-07-24: qty 1

## 2013-07-24 MED ORDER — OXYCODONE HCL 5 MG PO TABS
ORAL_TABLET | ORAL | Status: AC
  Filled 2013-07-24: qty 1

## 2013-07-24 MED ORDER — BUPIVACAINE HCL (PF) 0.25 % IJ SOLN
INTRAMUSCULAR | Status: DC | PRN
  Administered 2013-07-24: 20 mL

## 2013-07-24 MED ORDER — CEFAZOLIN SODIUM 1-5 GM-% IV SOLN
1.0000 g | Freq: Three times a day (TID) | INTRAVENOUS | Status: AC
  Administered 2013-07-24 (×2): 1 g via INTRAVENOUS
  Filled 2013-07-24 (×2): qty 50

## 2013-07-24 MED ORDER — BISACODYL 10 MG RE SUPP: 10.0000 mg | Freq: Every day | RECTAL | Status: DC | PRN

## 2013-07-24 MED ORDER — 0.9 % SODIUM CHLORIDE (POUR BTL) OPTIME
TOPICAL | Status: DC | PRN
  Administered 2013-07-24: 1000 mL

## 2013-07-24 MED ORDER — ONDANSETRON HCL 4 MG/2ML IJ SOLN: 4.0000 mg | INTRAMUSCULAR | Status: DC | PRN

## 2013-07-24 MED ORDER — SODIUM CHLORIDE 0.9 % IJ SOLN
3.0000 mL | Freq: Two times a day (BID) | INTRAMUSCULAR | Status: DC
  Administered 2013-07-25 – 2013-07-26 (×3): 3 mL via INTRAVENOUS

## 2013-07-24 MED ORDER — ROCURONIUM BROMIDE 100 MG/10ML IV SOLN
INTRAVENOUS | Status: DC | PRN
  Administered 2013-07-24: 40 mg via INTRAVENOUS

## 2013-07-24 MED ORDER — FENTANYL CITRATE 0.05 MG/ML IJ SOLN
INTRAMUSCULAR | Status: AC
  Filled 2013-07-24: qty 2

## 2013-07-24 MED ORDER — ACETAMINOPHEN 325 MG PO TABS: 650.0000 mg | ORAL_TABLET | ORAL | Status: DC | PRN

## 2013-07-24 MED ORDER — METHOCARBAMOL 500 MG PO TABS
ORAL_TABLET | ORAL | Status: AC
  Filled 2013-07-24: qty 1

## 2013-07-24 MED ORDER — HYDROCODONE-ACETAMINOPHEN 5-325 MG PO TABS
1.0000 | ORAL_TABLET | ORAL | Status: DC | PRN
  Administered 2013-07-25: 2 via ORAL
  Administered 2013-07-25: 1 via ORAL
  Filled 2013-07-24: qty 1
  Filled 2013-07-24: qty 2

## 2013-07-24 MED ORDER — THROMBIN 5000 UNITS EX SOLR
CUTANEOUS | Status: DC | PRN
  Administered 2013-07-24 (×2): 5000 [IU] via TOPICAL

## 2013-07-24 MED ORDER — ONDANSETRON HCL 4 MG/2ML IJ SOLN
INTRAMUSCULAR | Status: DC | PRN
  Administered 2013-07-24: 4 mg via INTRAVENOUS

## 2013-07-24 MED ORDER — MEPERIDINE HCL 25 MG/ML IJ SOLN: 6.2500 mg | INTRAMUSCULAR | Status: DC | PRN

## 2013-07-24 MED ORDER — PROPOFOL 10 MG/ML IV BOLUS
INTRAVENOUS | Status: DC | PRN
  Administered 2013-07-24: 100 mg via INTRAVENOUS

## 2013-07-24 MED ORDER — NEOSTIGMINE METHYLSULFATE 1 MG/ML IJ SOLN
INTRAMUSCULAR | Status: DC | PRN
  Administered 2013-07-24: 3 mg via INTRAVENOUS

## 2013-07-24 MED ORDER — HYDROMORPHONE HCL PF 1 MG/ML IJ SOLN
INTRAMUSCULAR | Status: DC | PRN
  Administered 2013-07-24 (×2): 0.5 mg via INTRAVENOUS

## 2013-07-24 MED ORDER — ALUM & MAG HYDROXIDE-SIMETH 200-200-20 MG/5ML PO SUSP: 30.0000 mL | Freq: Four times a day (QID) | ORAL | Status: DC | PRN

## 2013-07-24 MED ORDER — PHENOL 1.4 % MT LIQD: 1.0000 | OROMUCOSAL | Status: DC | PRN

## 2013-07-24 SURGICAL SUPPLY — 50 items
BAG DECANTER FOR FLEXI CONT (MISCELLANEOUS) ×2 IMPLANT
BENZOIN TINCTURE PRP APPL 2/3 (GAUZE/BANDAGES/DRESSINGS) ×2 IMPLANT
BLADE SURG ROTATE 9660 (MISCELLANEOUS) IMPLANT
BRUSH SCRUB EZ PLAIN DRY (MISCELLANEOUS) ×2 IMPLANT
BUR CUTTER 7.0 ROUND (BURR) ×2 IMPLANT
CANISTER SUCT 3000ML (MISCELLANEOUS) ×2 IMPLANT
CONT SPEC 4OZ CLIKSEAL STRL BL (MISCELLANEOUS) ×2 IMPLANT
DECANTER SPIKE VIAL GLASS SM (MISCELLANEOUS) ×2 IMPLANT
DERMABOND ADVANCED (GAUZE/BANDAGES/DRESSINGS) ×1
DERMABOND ADVANCED .7 DNX12 (GAUZE/BANDAGES/DRESSINGS) ×1 IMPLANT
DRAPE LAPAROTOMY 100X72X124 (DRAPES) ×2 IMPLANT
DRAPE MICROSCOPE LEICA (MISCELLANEOUS) ×2 IMPLANT
DRAPE MICROSCOPE ZEISS OPMI (DRAPES) IMPLANT
DRAPE POUCH INSTRU U-SHP 10X18 (DRAPES) ×2 IMPLANT
DRAPE PROXIMA HALF (DRAPES) IMPLANT
DRAPE SURG 17X23 STRL (DRAPES) ×4 IMPLANT
ELECT REM PT RETURN 9FT ADLT (ELECTROSURGICAL) ×2
ELECTRODE REM PT RTRN 9FT ADLT (ELECTROSURGICAL) ×1 IMPLANT
EVACUATOR 1/8 PVC DRAIN (DRAIN) ×2 IMPLANT
GAUZE SPONGE 4X4 16PLY XRAY LF (GAUZE/BANDAGES/DRESSINGS) IMPLANT
GLOVE BIO SURGEON STRL SZ8 (GLOVE) ×2 IMPLANT
GLOVE ECLIPSE 8.5 STRL (GLOVE) ×2 IMPLANT
GLOVE EXAM NITRILE LRG STRL (GLOVE) ×4 IMPLANT
GLOVE EXAM NITRILE MD LF STRL (GLOVE) IMPLANT
GLOVE EXAM NITRILE XL STR (GLOVE) IMPLANT
GLOVE EXAM NITRILE XS STR PU (GLOVE) IMPLANT
GLOVE INDICATOR 8.5 STRL (GLOVE) ×2 IMPLANT
GLOVE OPTIFIT SS 6.5 STRL BRWN (GLOVE) ×2 IMPLANT
GLOVE SS N UNI LF 7.0 STRL (GLOVE) ×2 IMPLANT
GOWN BRE IMP SLV AUR LG STRL (GOWN DISPOSABLE) ×2 IMPLANT
GOWN BRE IMP SLV AUR XL STRL (GOWN DISPOSABLE) ×4 IMPLANT
GOWN STRL REIN 2XL LVL4 (GOWN DISPOSABLE) IMPLANT
KIT BASIN OR (CUSTOM PROCEDURE TRAY) ×2 IMPLANT
KIT ROOM TURNOVER OR (KITS) ×2 IMPLANT
NEEDLE HYPO 22GX1.5 SAFETY (NEEDLE) ×2 IMPLANT
NEEDLE SPNL 22GX3.5 QUINCKE BK (NEEDLE) ×2 IMPLANT
NS IRRIG 1000ML POUR BTL (IV SOLUTION) ×2 IMPLANT
PACK LAMINECTOMY NEURO (CUSTOM PROCEDURE TRAY) ×2 IMPLANT
PAD ARMBOARD 7.5X6 YLW CONV (MISCELLANEOUS) ×6 IMPLANT
RUBBERBAND STERILE (MISCELLANEOUS) ×4 IMPLANT
SPONGE GAUZE 4X4 12PLY (GAUZE/BANDAGES/DRESSINGS) ×2 IMPLANT
SPONGE SURGIFOAM ABS GEL SZ50 (HEMOSTASIS) ×2 IMPLANT
STRIP CLOSURE SKIN 1/2X4 (GAUZE/BANDAGES/DRESSINGS) ×2 IMPLANT
SUT VIC AB 2-0 CT1 18 (SUTURE) ×2 IMPLANT
SUT VIC AB 3-0 SH 8-18 (SUTURE) ×2 IMPLANT
SYR 20ML ECCENTRIC (SYRINGE) ×2 IMPLANT
TOWEL OR 17X24 6PK STRL BLUE (TOWEL DISPOSABLE) ×2 IMPLANT
TOWEL OR 17X26 10 PK STRL BLUE (TOWEL DISPOSABLE) ×2 IMPLANT
TRAY FOLEY CATH 14FRSI W/METER (CATHETERS) ×2 IMPLANT
WATER STERILE IRR 1000ML POUR (IV SOLUTION) ×2 IMPLANT

## 2013-07-24 NOTE — Op Note (Signed)
Date of procedure: 07/24/2013  Date of dictation: Same  Service: Neurosurgery  Preoperative diagnosis: L3-4 stenosis with L3-4 bilateral herniated nucleus pulposus  Postoperative diagnosis: Same  Procedure Name: 34 decompressive laminectomy with bilateral L3 and L4 decompressive foraminotomies. L3-4-left microdiscectomy  Surgeon:Tallon Gertz A.Gyanna Jarema, M.D.  Asst. Surgeon: Wynetta Emery  Anesthesia: General  Indication: 79-year-old female is 15 years status post L4-5 decompression and fusion presents now with adjacent level degeneration severe stenosis and a moderately large disc herniation at L3-4 causing severe thecal sac compression and severe back pain and radiculopathy. Patient has failed conservative management presents now for decompression.  Operative note: After induction of anesthesia, patient positioned prone on the Wilson frame and appropriately padded. His lumbar prepped and draped sterilely. Incision made overlying L3-4. Dissection performed bilaterally. Retractor placed. X-ray taken. Confirmed. Decompressive laminectomy performed using Leksell rongeurs Kerrison rongeurs a high-speed drill to remove the inferior one half of the lamina of L3 medial one third of the L3-4 facet joints bilaterally. Ligamentum flavum was elevated and resected in a piecemeal fashion. Decompressive foraminotomies were performed on course exiting L3 and L4 nerve roots bilaterally. Marked with a month of his microdissection. 31st patient's left side thecal sac and L4 nerve root gently mobilized retractor was midline. Disc herniation was dissected free and incised with 15 blade. A large amount of subligamentous disc herniation was encountered and removed. The space was entered. Discectomy was performed using pituitary rongeurs operative and angled pituitary rongeurs and Epstein curettes elements the disc herniation were resected. All wounds were obviously degenerative disc there is no thenar space. This was a very thorough  discectomy been achieved. The right side of the canal was explored and found to be free from any significant disc herniation. The wound is then irrigated with saline solution. Gelfoam was placed topically for hemostasis which had been prepared microscope it was more appropriate anemia a drain is epidural space was then closed in layers Vicryl sutures. Steri-Strips structures were applied. There are no apparent complications the patient tolerated the procedure well and she returns to recover but she.

## 2013-07-24 NOTE — Preoperative (Signed)
Beta Blockers   Toprol 100 mgs taken on

## 2013-07-24 NOTE — Anesthesia Preprocedure Evaluation (Addendum)
Anesthesia Evaluation  Patient identified by MRN, date of birth, ID band Patient awake    Reviewed: Allergy & Precautions, H&P , NPO status , Patient's Chart, lab work & pertinent test results  History of Anesthesia Complications Negative for: history of anesthetic complications  Airway Mallampati: II TM Distance: >3 FB Neck ROM: Full    Dental  (+) Edentulous Upper, Partial Lower and Dental Advisory Given   Pulmonary shortness of breath, former smoker,    + wheezing      Cardiovascular hypertension, Pt. on medications and Pt. on home beta blockers +CHF (h/o CHF ) Rhythm:Regular Rate:Normal     Neuro/Psych Anxiety TIA   GI/Hepatic Neg liver ROS, GERD-  Medicated and Controlled,  Endo/Other  diabetes (glu 144), Type 2, Insulin DependentMorbid obesity  Renal/GU Renal InsufficiencyRenal disease (creat 1.21)     Musculoskeletal   Abdominal (+) + obese,   Peds  Hematology  (+) Blood dyscrasia (Hb 9.1), anemia ,   Anesthesia Other Findings Will pre-treat with Albuterol MDI  Reproductive/Obstetrics                        Anesthesia Physical Anesthesia Plan  ASA: III  Anesthesia Plan: General   Post-op Pain Management:    Induction: Intravenous  Airway Management Planned: Oral ETT  Additional Equipment:   Intra-op Plan:   Post-operative Plan: Extubation in OR  Informed Consent: I have reviewed the patients History and Physical, chart, labs and discussed the procedure including the risks, benefits and alternatives for the proposed anesthesia with the patient or authorized representative who has indicated his/her understanding and acceptance.   Dental advisory given  Plan Discussed with: CRNA and Surgeon  Anesthesia Plan Comments: (Plan routine monitors, GETA)        Anesthesia Quick Evaluation

## 2013-07-24 NOTE — Transfer of Care (Signed)
Immediate Anesthesia Transfer of Care Note  Patient: Kristen Barron  Procedure(s) Performed: Procedure(s): LUMBAR LAMINECTOMY/DECOMPRESSION MICRODISCECTOMY LUMBAR THREE-FOUR (Bilateral)  Patient Location: PACU  Anesthesia Type:General  Level of Consciousness: awake, alert , oriented and patient cooperative  Airway & Oxygen Therapy: Patient Spontanous Breathing and Patient connected to face mask oxygen  Post-op Assessment: Report given to PACU RN and Post -op Vital signs reviewed and stable  Post vital signs: Reviewed and stable  Complications: No apparent anesthesia complications

## 2013-07-24 NOTE — Discharge Summary (Signed)
Name: Kristen Barron MRN: [id number] DOB: [birth year] 79 y.o. PCP: [facility]  Date of Admission: 07/20/2013  8:01 AM Date of Discharge: 07/26/2013 Attending Physician: Debe Coder, MD  Discharge Diagnosis: Principal Problem:   Spinal stenosis of lumbar region Active Problems:   Hyponatremia   Hypertension   Diabetes mellitus   Normocytic anemia   CKD (chronic kidney disease) stage 3, GFR 30-59 ml/min   DDD (degenerative disc disease), lumbar   Lumbar stenosis with neurogenic claudication  Discharge Medications:   Medication List    STOP taking these medications       AUGMENTIN 875-125 MG per tablet  Generic drug:  amoxicillin-clavulanate     omeprazole 20 MG capsule  Commonly known as:  PRILOSEC  Replaced by:  pantoprazole 40 MG tablet      TAKE these medications       amLODipine 5 MG tablet  Commonly known as:  NORVASC  Take 5 mg by mouth daily.     aspirin EC 81 MG tablet  Take 81 mg by mouth daily.     CALCIUM 500 PO  Take 500 mg by mouth daily.     clopidogrel 75 MG tablet  Commonly known as:  PLAVIX  Take 75 mg by mouth daily with breakfast.     DSS 100 MG Caps  Take 100 mg by mouth daily.     furosemide 20 MG tablet  Commonly known as:  LASIX  Take 20 mg by mouth 2 (two) times daily.     glipiZIDE 10 MG 24 hr tablet  Commonly known as:  GLUCOTROL XL  Take 10 mg by mouth 2 (two) times daily.     hydrALAZINE 25 MG tablet  Commonly known as:  APRESOLINE  Take 25 mg by mouth 3 (three) times daily.     HYDROcodone-acetaminophen 5-325 MG per tablet  Commonly known as:  NORCO/VICODIN  Take 1-2 tablets by mouth every 4 (four) hours as needed for moderate pain.     metoprolol succinate 100 MG 24 hr tablet  Commonly known as:  TOPROL-XL  Take 100 mg by mouth daily. Take with or immediately following a meal.     omega-3 acid ethyl esters 1 G capsule  Commonly known as:  LOVAZA  Take 1 g by mouth daily.     oxybutynin 5 MG tablet  Commonly known as:  DITROPAN  Take 5-10 mg by mouth 2 (two) times daily. Takes 5mg  in the morning and 10mg  in the evening     oxyCODONE-acetaminophen 7.5-325 MG per tablet  Commonly known as:  PERCOCET  Take 1 tablet by mouth every 4 (four) hours as needed for pain.     pantoprazole 40 MG tablet  Commonly known as:  PROTONIX  Take 1 tablet (40 mg total) by mouth daily.     pravastatin 80 MG tablet  Commonly known as:  PRAVACHOL  Take 80 mg by mouth daily.     quinapril 40 MG tablet  Commonly known as:  ACCUPRIL  Take 40 mg by mouth at bedtime.     senna 8.6 MG Tabs tablet  Commonly known as:  SENOKOT  Take 1 tablet (8.6 mg total) by mouth 2 (two) times daily.     traZODone 50 MG tablet  Commonly known as:  DESYREL  Take 50-100 mg by mouth at bedtime as needed for sleep.        Disposition and follow-up:   [name] was discharged from  [facility] in Stable condition.  At the hospital follow up visit please address:  1.  Pain control. Hyperkalemia. Hyponatremia.  2.  Labs / imaging needed at time of follow-up: BMP, CBC  3.  Pending labs/ test needing follow-up: None  Follow-up Appointments: Follow-up Information   Follow up with [facility], MD On 08/02/2013. (@11 :00am. This is your primary care doctor.)    Specialty:  Family Medicine   Contact information:   [address] [place] [address] [phone]       Follow up with Temple Pacini, MD On 08/09/2013. (@1 :00pm. This is your neurosurgeon.)    Specialty:  Neurosurgery   Contact information:   [address] [place] [address] [phone]       Discharge Instructions: Discharge Orders   Future Orders Complete By Expires   Diet - low sodium heart healthy  As directed    Increase activity slowly  As directed       Consultations: Treatment Team:  Temple Pacini, MD  Procedures Performed:  Dg Lumbar Spine  Complete  07/20/2013   CLINICAL DATA:  Worsening lower back pain. Pain shoots down both legs for 3 months.  EXAM: LUMBAR SPINE - COMPLETE 4+ VIEW  COMPARISON:  07/20/2013  FINDINGS: Patient has Re cages at L4-5. There is significant disc height loss at L2-3, L3-4 associated with degenerative changes. Degenerative changes are also noted in the lower thoracic spine. There is no evidence for acute fracture or subluxation. No suspicious lytic or blastic lesions are identified. Atherosclerotic change is identified in the abdominal aorta. Regional bowel gas pattern is nonobstructive.  IMPRESSION: 1. Postoperative changes. 2. Significant degenerative changes without evidence for acute abnormality   Electronically Signed   By: Rosalie Gums M.D.   On: 07/20/2013 21:46   US Abdomen Complete  07/20/2013   CLINICAL DATA:  Back pain  EXAM: ULTRASOUND ABDOMEN COMPLETE  COMPARISON:  None.  FINDINGS: Gallbladder:  A nonmobile 1.4 cm gallstone appreciated. There is no evidence of wall thickening, measuring 2.7 mm. There is no evidence of a sonographic Murphy's sign no pericholecystic fluid.  Common bile duct:  Diameter: 4.5 mm  Liver:  No focal lesion identified. Within normal limits in parenchymal echogenicity.  IVC:  No abnormality visualized.  Pancreas:  Visualized portion unremarkable.  Spleen:  Multiple punctate echogenic foci with reverberation artifact identified within the spleen indicative of multiple calcifications reflecting calcified granulomas. Otherwise unremarkable.  Right Kidney:  Length: 9.6 cm. Echogenicity within normal limits. No mass or hydronephrosis visualized.  Left Kidney:  Length: 10 cm. Echogenicity within normal limits. No mass or hydronephrosis visualized.  Abdominal aorta:  No aneurysm visualized, maximal diameter 2.1 cm.  Other findings:  None.  IMPRESSION: Nonmobile gallstone.  Otherwise unremarkable abdominal ultrasound.   Electronically Signed   By: Salome Holmes M.D.   On: 07/20/2013 10:08    Dg Abd Acute W/chest  07/20/2013   CLINICAL DATA:  Back pain, shortness of breath  EXAM: ACUTE ABDOMEN SERIES (ABDOMEN 2 VIEW & CHEST 1 VIEW)  COMPARISON:  None.  FINDINGS: There is no evidence of dilated bowel loops or free intraperitoneal air. Bowel content is noted throughout colon. No radiopaque calculi or other significant radiographic abnormality is seen. Heart size and mediastinal contours are within normal limits. There is no focal infiltrate, pulmonary edema, or pleural effusion. There is a small calcified granuloma in the left mid lung. There is scoliosis and degenerative joint changes of the  spine. Patient is status post prior fixation of the lower lumbar spine.  IMPRESSION: Negative abdominal radiographs. Constipation. No acute cardiopulmonary disease.   Electronically Signed   By: Sherian Rein M.D.   On: 07/20/2013 09:26    Admission HPI:  Ms. Matus is an 79 yo woman with history of HTN, CHF (echo not on file here or at PCP office), CVA and back surgery (L4-L5 cage fusion) who presents to [facility] on 07/20/13 due to worsening, chronic low back pain that shoots down both legs. She notes that she has had progressive back pain since hospital discharge in August (for CVA at [facility]) that has prompted evaluation at the [facility] ED, Urgent care, her PCP and an orthopedist. She underwent lumbar MRI yesterday, after which pain began to significantly worsen to the point that she could not get out of bed this morning (due to pain, not weakness). She lives in [place] with her grand daughter, who brought her to the hospital. She describes pain as intermittent, sharp, shooting, 10/10. She also notes low back muscle spasms. No alleviating or exacerbating factors. She reports dilaudid given in the ED has not helped, and has possibly made pain worse. She denies urinary or bowel incontinence. No falls secondary to pain. No numbness/tingling. No fever/chills.  In the ED she was given Dilaudid 0.5mg  x2. After  my evaluation, she was subsequently given ketorlac 30mg  IV, and additional 0.5mg  dilaudid IV and valium 2mg .  Physical Exam:  Blood pressure 125/85, pulse 81, temperature 98.2 F (36.8 C), temperature source Oral, resp. rate 20, height 5\' 2"  (1.575 m), weight 210 lb (95.255 kg), SpO2 94.00%.  General: resting in bed, moderate distress, appears as stated age  HEENT: sluggish pupils b/l, EOMI, no scleral icterus  Cardiac: RRR, no rubs, murmurs or gallops  Pulm: clear to auscultation anteriorly (unable to listen to back, patient with increased pain on positional change), good air mvmt anteriorly  Abd: soft, nontender, nondistended, BS normoactive  Ext: warm and well perfused, 1+ pretibial edema, straight leg test positive b/l (sciatic pain reproduced b/l at about 60 degrees)  Back: lumbar paraspinal muscular contractures  Neuro: alert and oriented X3, cranial nerves II-XII grossly intact, strength 5/5 b/l UE & LE   Hospital Course by problem list: Ms. Schoenfelder is a 79 year old woman with history of HTN, CHF, DM, CKD, and DDD who is admitted on 07/20/13 with worsening low back pain.   1. Spinal stenosis of lumbar region/degenerative disc disease - Patient presented with severe lumbar pain with radiation into her lower extremities. Neurosurgery saw the patient in consultation and felt simple nonoperative management would not be effective. On 07/24/13 the patient underwent L3-L4 decompressive laminectomy and microdiscectomy. Her aspirin/Plavix was held over the weekend prior to her surgery. She did great post-operatively. Pain was minimal and well controlled on Norco 5-325 1-2 tabs q4h prn, Tylenol 650mg  q4h prn. PT/OT recommended home health services, rolling walker with 5" wheels. She lives with her granddaughter and has good family support from her sister as well. Her wound vac was removed on day of discharge. She will follow up with Dr. Jordan Likes as an outpatient in 2 weeks.  2. Hyponatremia - Na 124 on  admission. Unclear how long this has been going on, but we remained cautious and avoided correction of >10 in 24h. Serum and urine osms suggest primary polydipsia vs. Malnutrition (pasted below, her Lasix was held for their measurement). Patient denies drinking exorbitant amount of fluid prior to admission. If anything,  her po intake was decreased 2/2 pain. She was completely asymptomatic at discharge. We have arranged for close outpatient follow up with her PCP for re-check of her BMP. - Serum osmolality > 270 (low)  - Calculated osmolality > 268  - Osmolal gap > 2 (wnl)  - Urine osmolality > 310 (low)  - Urine sodium > 17   Sodium  Date Value Range Status  07/26/2013 125* 135 - 145 mEq/L Final  07/26/2013 128* 135 - 145 mEq/L Final  07/25/2013 128* 135 - 145 mEq/L Final  07/24/2013 132* 135 - 145 mEq/L Final  07/22/2013 130* 135 - 145 mEq/L Final    3. Mild hyperkalema - K = 5.4 on morning of discharge. We had held her Lasix in the immediate pre- and post-op period. EKG was within normal limits without peaked T waves or prolonged QRD. Lasix was restarted and repeat BMP showed K of 5.0. We have arranged for close outpatient follow up with her PCP for re-check of her BMP.  Potassium  Date Value Range Status  07/26/2013 5.0  3.5 - 5.1 mEq/L Final  07/26/2013 5.4* 3.5 - 5.1 mEq/L Final  07/25/2013 5.2* 3.5 - 5.1 mEq/L Final  07/24/2013 4.8  3.5 - 5.1 mEq/L Final  07/22/2013 5.0  3.5 - 5.1 mEq/L Final    4. Recent UTI - We completed her course of Augmentin prescribed prior to admission . No complaints of dysuria.   5. CKD 3 - Baseline Cr of 1.31. Cr increased to 1.54 on 12/12, but improved to baseline with gentle IVF. Likely transient pre-renal azotemia from decreased po intake prior to admission. We monitored her fluid status and renal function and avoided NSAID use.  6. DM - A1c 8.2 on 03/23/13 per PCP records. Home regimen includes glipizide and levemir 10u qHS. We decreased her levemir  to 5u qHS given decreased PO intake and provided her with a SSI.  7. Hypertension - BP well controlled on home amlodipine, hydralazine, metoprolol and quinapril.  8. Normocytic anemia - Hb at baseline on admission (per clinic notes, Hb 10 on 01/18/12, B12 and folate wnl), likely related to CKD. Hgb dropped a point on 12/16, but this is expected due to acute blood loss from her surgery. It was stable on day of discharge.  Hemoglobin   Date  Value  Range  Status   07/26/2013  8.5*  12.0 - 15.0 g/dL  Final   40/98/1191  8.5*  12.0 - 15.0 g/dL  Final   47/82/9562  9.1*  12.0 - 15.0 g/dL  Final   13/03/6577  46.9*  12.0 - 15.0 g/dL  Final    9. h/o CVA - We continued her statin. ASA and Plavix were held for 2 days prior to surgery and then resumed on post-op day 2.  10. H/o CHF - Unclear if systolic or diastolic; she is on BB & ACEI at home. We continued these and resumed her Lasix on post-op day 2.  11. GERD - Stable. We replaced her omeprazole with Protonix at discharge as she is taking Plavix.   Discharge Vitals:   BP 155/52  Pulse 65  Temp(Src) 97.9 F (36.6 C) (Oral)  Resp 18  Ht 5\' 2"  (1.575 m)  Wt 220 lb 1.6 oz (99.837 kg)  BMI 40.25 kg/m2  SpO2 100%  Discharge Labs:  Results for orders placed during the hospital encounter of 07/20/13 (from the past 24 hour(s))  GLUCOSE, CAPILLARY     Status: Abnormal   Collection  Time    07/25/13  9:40 PM      Result Value Range   Glucose-Capillary 145 (*) 70 - 99 mg/dL   Comment 1 Documented in Chart     Comment 2 Notify RN    CBC     Status: Abnormal   Collection Time    07/26/13  5:40 AM      Result Value Range   WBC 8.5  4.0 - 10.5 K/uL   RBC 2.88 (*) 3.87 - 5.11 MIL/uL   Hemoglobin 8.5 (*) 12.0 - 15.0 g/dL   HCT 16.1 (*) 09.6 - 04.5 %   MCV 87.2  78.0 - 100.0 fL   MCH 29.5  26.0 - 34.0 pg   MCHC 33.9  30.0 - 36.0 g/dL   RDW 40.9  81.1 - 91.4 %   Platelets 244  150 - 400 K/uL  BASIC METABOLIC PANEL     Status: Abnormal    Collection Time    07/26/13  5:40 AM      Result Value Range   Sodium 128 (*) 135 - 145 mEq/L   Potassium 5.4 (*) 3.5 - 5.1 mEq/L   Chloride 96  96 - 112 mEq/L   CO2 24  19 - 32 mEq/L   Glucose, Bld 172 (*) 70 - 99 mg/dL   BUN 16  6 - 23 mg/dL   Creatinine, Ser 7.82 (*) 0.50 - 1.10 mg/dL   Calcium 8.7  8.4 - 95.6 mg/dL   GFR calc non Af Amer 35 (*) >90 mL/min   GFR calc Af Amer 40 (*) >90 mL/min  GLUCOSE, CAPILLARY     Status: Abnormal   Collection Time    07/26/13  6:51 AM      Result Value Range   Glucose-Capillary 153 (*) 70 - 99 mg/dL   Comment 1 Documented in Chart     Comment 2 Notify RN    BASIC METABOLIC PANEL     Status: Abnormal   Collection Time    07/26/13 12:45 PM      Result Value Range   Sodium 125 (*) 135 - 145 mEq/L   Potassium 5.0  3.5 - 5.1 mEq/L   Chloride 91 (*) 96 - 112 mEq/L   CO2 23  19 - 32 mEq/L   Glucose, Bld 185 (*) 70 - 99 mg/dL   BUN 16  6 - 23 mg/dL   Creatinine, Ser 2.13 (*) 0.50 - 1.10 mg/dL   Calcium 8.6  8.4 - 08.6 mg/dL   GFR calc non Af Amer 38 (*) >90 mL/min   GFR calc Af Amer 45 (*) >90 mL/min    Signed: Vivi Barrack, MD 07/26/2013, 4:52 PM   Time Spent on Discharge: 40 minutes Services Ordered on Discharge: Home health PT/OT Equipment Ordered on Discharge: Rolling walker

## 2013-07-24 NOTE — Anesthesia Procedure Notes (Signed)
Procedure Name: Intubation Date/Time: 07/24/2013 10:31 AM Performed by: Tyrone Nine Pre-anesthesia Checklist: Patient identified, Timeout performed, Emergency Drugs available, Suction available and Patient being monitored Patient Re-evaluated:Patient Re-evaluated prior to inductionOxygen Delivery Method: Circle system utilized Preoxygenation: Pre-oxygenation with 100% oxygen Intubation Type: IV induction Ventilation: Mask ventilation without difficulty Laryngoscope Size: Mac and 3 Grade View: Grade I Tube type: Oral Tube size: 7.0 mm Number of attempts: 1 Airway Equipment and Method: Stylet Placement Confirmation: ETT inserted through vocal cords under direct vision,  breath sounds checked- equal and bilateral and positive ETCO2 Secured at: 21 cm Tube secured with: Tape Dental Injury: Teeth and Oropharynx as per pre-operative assessment

## 2013-07-24 NOTE — Brief Op Note (Signed)
07/20/2013 - 07/24/2013  12:00 PM  PATIENT:  Kristen Barron  79 y.o. female  PRE-OPERATIVE DIAGNOSIS:  llumbar three-four stenosis/ Herniated Nucleus Pulposus  POST-OPERATIVE DIAGNOSIS:  lumbar three-four stenosis/ Herniated Nucleus Pulposus  PROCEDURE:  Procedure(s): LUMBAR LAMINECTOMY/DECOMPRESSION MICRODISCECTOMY LUMBAR THREE-FOUR (Bilateral)  SURGEON:  Surgeon(s) and Role:    * Temple Pacini, MD - Primary    * Mariam Dollar, MD - Assisting  PHYSICIAN ASSISTANT:   ASSISTANTS:    ANESTHESIA:   general  EBL:  Total I/O In: -  Out: 725 [Urine:575; Blood:150]  BLOOD ADMINISTERED:none  DRAINS: (Medium) Hemovact drain(s) in the Epidural space with  Suction Open   LOCAL MEDICATIONS USED:  MARCAINE     SPECIMEN:  No Specimen  DISPOSITION OF SPECIMEN:  N/A  COUNTS:  YES  TOURNIQUET:  * No tourniquets in log *  DICTATION: .Dragon Dictation  PLAN OF CARE: Admit to inpatient   PATIENT DISPOSITION:  PACU - hemodynamically stable.   Delay start of Pharmacological VTE agent (>24hrs) due to surgical blood loss or risk of bleeding: yes

## 2013-07-24 NOTE — Progress Notes (Signed)
  Date: 07/24/2013  Patient name: Kristen Barron  Medical record number: [id number]  Date of birth: 1929-04-26   This patient has been seen and the plan of care was discussed with the house staff. Please see their note for complete details. I concur with their findings with the following additions/corrections:  Seen post surgery. Doing well, no post op pain currently.  BP continues to be mildly elevated, but given age this is likely within an okay range.  Will keep monitoring.  Surgery team also following.   Inez Catalina, MD 07/24/2013, 3:08 PM

## 2013-07-24 NOTE — Anesthesia Postprocedure Evaluation (Signed)
  Anesthesia Post-op Note  Patient: Kristen Barron  Procedure(s) Performed: Procedure(s): LUMBAR LAMINECTOMY/DECOMPRESSION MICRODISCECTOMY LUMBAR THREE-FOUR (Bilateral)  Patient Location: PACU  Anesthesia Type:General  Level of Consciousness: awake, alert , oriented and patient cooperative  Airway and Oxygen Therapy: Patient Spontanous Breathing and Patient connected to nasal cannula oxygen  Post-op Pain: mild  Post-op Assessment: Post-op Vital signs reviewed, Patient's Cardiovascular Status Stable, Respiratory Function Stable, Patent Airway, No signs of Nausea or vomiting and Pain level controlled  Post-op Vital Signs: Reviewed and stable  Complications: No apparent anesthesia complications

## 2013-07-24 NOTE — Progress Notes (Signed)
Subjective: Patient seen and examined at the bedside. She still has persistent lower back pain that radiates to her legs. No chest pain or shortness of breath. She is anticipating surgery today.   Objective: Vital signs in last 24 hours: Filed Vitals:   07/23/13 2136 07/24/13 0147 07/24/13 0500 07/24/13 0600  BP: 131/48 150/60  138/42  Pulse: 68 70  75  Temp: 98.2 F (36.8 C) 98 F (36.7 C)  98 F (36.7 C)  TempSrc: Oral Oral  Oral  Resp: 20 18  18   Height:      Weight:   233 lb 4 oz (105.8 kg)   SpO2: 94% 97%  96%   Weight change: 3 lb 4 oz (1.473 kg)  Intake/Output Summary (Last 24 hours) at 07/24/13 0755 Last data filed at 07/23/13 2304  Gross per 24 hour  Intake      0 ml  Output   1050 ml  Net  -1050 ml   Physical Exam:  General: resting in bed, in NAD HEENT: no scleral icterus, MM Cardiac: RRR, no rubs, murmurs or gallops  Pulm: bibasilar lung crackles, scattered respiratory wheezing, no respiratory distress Abd: soft, nontender, nondistended, BS normoactive, no suprapubic tenderness Ext: warm and well perfused, 1+ pretibial edema Back: lumbar paraspinal tenderness Neuro: alert and oriented X3, cranial nerves II-XII grossly intact, moving all extremities   Lab Results: Basic Metabolic Panel:  Recent Labs Lab 07/22/13 0820 07/22/13 2025  NA 128* 130*  K 4.7 5.0  CL 95* 99  CO2 22 24  GLUCOSE 183* 110*  BUN 15 12  CREATININE 1.29* 1.14*  CALCIUM 8.2* 8.3*   Liver Function Tests:  Recent Labs Lab 07/20/13 0834  AST 17  ALT 12  ALKPHOS 75  BILITOT 0.5  PROT 7.3  ALBUMIN 3.5   CBC:  Recent Labs Lab 07/20/13 0834  WBC 6.4  NEUTROABS 4.8  HGB 10.8*  HCT 31.3*  MCV 84.6  PLT 229   CBG:  Recent Labs Lab 07/22/13 2151 07/23/13 0600 07/23/13 1122 07/23/13 1644 07/23/13 2138 07/24/13 0629  GLUCAP 114* 131* 134* 159* 140* 144*    Studies/Results: No results found. Medications: I have reviewed the patient's current  medications. Scheduled Meds: . amLODipine  5 mg Oral Daily  .  ceFAZolin (ANCEF) IV  3 g Intravenous 30 min Pre-Op  . gabapentin  200 mg Oral QHS  . heparin  5,000 Units Subcutaneous Q8H  . hydrALAZINE  25 mg Oral TID  . insulin aspart  0-15 Units Subcutaneous TID WC  . insulin detemir  5 Units Subcutaneous QHS  . lisinopril  40 mg Oral Daily  . metoprolol succinate  100 mg Oral Daily  . oxybutynin  10 mg Oral QHS  . oxybutynin  5 mg Oral Daily  . pantoprazole  40 mg Oral Daily  . polyethylene glycol  17 g Oral Daily  . pravastatin  80 mg Oral Daily   Continuous Infusions: . sodium chloride 75 mL/hr at 07/23/13 1928   PRN Meds:.albuterol, HYDROmorphone (DILAUDID) injection, methocarbamol, oxyCODONE, oxyCODONE-acetaminophen Assessment/Plan: Kristen Barron is a 79 year old woman with history of HTN, CHF, DM, CKD, and DDD who is admitted on 07/20/13 with worsening low back pain.   #Spinal stenosis of lumbar region/DDD - Neurosurgery feels simple nonoperative management will not be effective. Patient would like to move forward with L3-L4 decompressive laminectomy and microdiscectomy. Because of her aspirin/Plavix use and lack of motor weakness, we decided to wait until 12/5 for the surgery. -  Appreciate neurosurgery recs - Called radiology > since her spinal equipment is titanium it would not be affected/dislodged by magnetism of MRI - Pain control with Dilaudid to 1mg  q4h prn, Percocet 5-325 1-2 tabs q6h prn, Oxycodone IR 5mg  q6h prn, Robaxin 500mg  q6h prn, gabapentin 200mg  daily - NPO after midnight - OR today with Dr. Jordan Likes  #Hyponatremia - Improved with IVF, trend as below. Unclear how long this has been going on, but will remain cautious and avoid correction of >10 in 24h. Serum and urine osms (below, Lasix held for measurement) suggest primary polydipsia vs. Malnutrition. Na appropriately and gradually trending up.  - IVF NS @75ml /hr, rate was decreased over the weekend as patient  developed lung crackles - Serum osmolality > 270 (low) - Calculated osmolality > 268 - Osmolal gap > 2 (wnl) - Urine osmolality > 310 (low) - Urine sodium > 17 -Unable to obtain orthostatic vitals 2/2 pain  Sodium  Date Value Range Status  07/22/2013 130* 135 - 145 mEq/L Final  07/22/2013 128* 135 - 145 mEq/L Final  07/21/2013 125* 135 - 145 mEq/L Final  07/21/2013 126* 135 - 145 mEq/L Final  07/20/2013 126* 135 - 145 mEq/L Final    #Recent UTI - We completed her course of Augmentin. No dysuria.   #CKD 3 - Baseline Cr of 1.31. Cr increased to 1.54 yesterday, but today it is down to below baseline. Likely transient pre-renal azotemia from decreased po intake prior to admission, improved with fluid resuscitation.  - Continue to monitor fluid status and renal function  - NSAID use cautiously   Creatinine, Ser  Date Value Range Status  07/22/2013 1.14* 0.50 - 1.10 mg/dL Final  91/47/8295 6.21* 0.50 - 1.10 mg/dL Final  30/86/5784 6.96* 0.50 - 1.10 mg/dL Final  29/52/8413 2.44* 0.50 - 1.10 mg/dL Final    #DM - W1U 8.2 on 03/23/13 per PCP records. Home regimen includes glipizide and levemir 10u qHS. - Decrease levemir to 5u qHS given decreased PO intake  - SSI   #Hypertension - BP well controlled.  - Continue home amlodipine, hydralazine, metoprolol and quinapril   #Normocytic anemia - Hb appears to be at baseline (Hb 10 on 01/18/12, B12 and folate wnl), likely related to CKD. - Continue to monitor   Hemoglobin  Date Value Range Status  07/20/2013 10.8* 12.0 - 15.0 g/dL Final    #h/o CVA - Continue statin, will temporarily stop ASA, plavix for planned surgery (last dose of Plavix 12/10 per patient's report)  #H/o CHF - Unclear if systolic or diastolic; she is on BB & ACEI at home. - Cont BB & ACEI  - Hold lasix as above   #VTE ppx - heparin TID   #Code status - full code   Dispo: Disposition is deferred at this time, awaiting improvement of current medical problems.   Anticipated discharge in approximately 1-3 day(s).   The patient does have a current PCP ([facility]) and does need an [facility] hospital follow-up appointment after discharge.  The patient does not have transportation limitations that hinder transportation to clinic appointments.  .Services Needed at time of discharge: Y = Yes, Blank = No PT:   OT:   RN:   Equipment:   Other:     LOS: 4 days   Vivi Barrack, MD 07/24/2013, 7:55 AM

## 2013-07-25 ENCOUNTER — Encounter (HOSPITAL_COMMUNITY): Payer: Self-pay | Admitting: Neurosurgery

## 2013-07-25 LAB — BASIC METABOLIC PANEL
CO2: 23 mEq/L (ref 19–32)
Calcium: 8.3 mg/dL — ABNORMAL LOW (ref 8.4–10.5)
Creatinine, Ser: 1.29 mg/dL — ABNORMAL HIGH (ref 0.50–1.10)
Glucose, Bld: 262 mg/dL — ABNORMAL HIGH (ref 70–99)

## 2013-07-25 LAB — GLUCOSE, CAPILLARY
Glucose-Capillary: 157 mg/dL — ABNORMAL HIGH (ref 70–99)
Glucose-Capillary: 215 mg/dL — ABNORMAL HIGH (ref 70–99)

## 2013-07-25 LAB — CBC
Hemoglobin: 8.5 g/dL — ABNORMAL LOW (ref 12.0–15.0)
MCH: 29.6 pg (ref 26.0–34.0)
MCHC: 34.1 g/dL (ref 30.0–36.0)
MCV: 86.8 fL (ref 78.0–100.0)
Platelets: 235 10*3/uL (ref 150–400)
RDW: 14.7 % (ref 11.5–15.5)

## 2013-07-25 MED ORDER — CLOPIDOGREL BISULFATE 75 MG PO TABS
75.0000 mg | ORAL_TABLET | Freq: Every day | ORAL | Status: DC
Start: 2013-07-26 — End: 2013-07-26
  Administered 2013-07-26: 75 mg via ORAL
  Filled 2013-07-25 (×2): qty 1

## 2013-07-25 MED ORDER — ASPIRIN EC 81 MG PO TBEC
81.0000 mg | DELAYED_RELEASE_TABLET | Freq: Every day | ORAL | Status: DC
  Administered 2013-07-26: 81 mg via ORAL
  Filled 2013-07-25 (×2): qty 1

## 2013-07-25 MED ORDER — FUROSEMIDE 20 MG PO TABS
20.0000 mg | ORAL_TABLET | Freq: Two times a day (BID) | ORAL | Status: DC
  Administered 2013-07-25 – 2013-07-26 (×2): 20 mg via ORAL
  Filled 2013-07-25 (×4): qty 1

## 2013-07-25 MED ORDER — ASPIRIN 81 MG PO CHEW
CHEWABLE_TABLET | ORAL | Status: AC
  Administered 2013-07-25: 81 mg
  Filled 2013-07-25: qty 1

## 2013-07-25 NOTE — Progress Notes (Signed)
  Date: 07/25/2013  Patient name: Kristen Barron  Medical record number: [id number]  Date of birth: 1929-05-01   This patient has been seen and the plan of care was discussed with the house staff. Please see their note for complete details. I concur with their findings with the following additions/corrections:  Patient doing well, some pain at site of surgery, controlled with oral medications.  She is nearing discharge if she continues to do well.   Inez Catalina, MD 07/25/2013, 1:16 PM

## 2013-07-25 NOTE — Progress Notes (Signed)
Inpatient Diabetes Program Recommendations  AACE/ADA: New Consensus Statement on Inpatient Glycemic Control (2013)  Target Ranges:  Prepandial:   less than 140 mg/dL      Peak postprandial:   less than 180 mg/dL (1-2 hours)      Critically ill patients:  140 - 180 mg/dL   Results for Kristen Barron, Kristen Barron (MRN [id number]) as of 07/25/2013 14:22  Ref. Range 07/24/2013 06:29 07/24/2013 16:34 07/24/2013 21:52 07/25/2013 06:50 07/25/2013 11:37  Glucose-Capillary Latest Range: 70-99 mg/dL 119 (H) 147 (H) 829 (H) 235 (H) 157 (H)   Inpatient Diabetes Program Recommendations Insulin - Basal: Please consider increasing Levemir to 7 units QHS. Correction (SSI): Please consider ordering Novolog bedtime correction scale. HgbA1C: Please consider ordering an A1C to determine glycemic control over the past 2-3 months.  Thanks, Orlando Penner, RN, MSN, CCRN Diabetes Coordinator Inpatient Diabetes Program [phone] (Team Pager) [phone] (AP office) [phone] [facility] office)

## 2013-07-25 NOTE — Evaluation (Signed)
Physical Therapy Evaluation Patient Details Name: Kristen Barron MRN: [id number] DOB: September 21, 1928 Today's Date: 07/25/2013 Time: 4098-1191 PT Time Calculation (min): 25 min  PT Assessment / Plan / Recommendation History of Present Illness  pt presents with L3-4 Lami.    Clinical Impression  Pt very motivated and anticipate great progress.  Pt indicated feeling lightheaded on eval, but noted she does that sometimes in the morning.  RN aware.  Will continue to follow.      PT Assessment  Patient needs continued PT services    Follow Up Recommendations  Home health PT;Supervision/Assistance - 24 hour    Does the patient have the potential to tolerate intense rehabilitation      Barriers to Discharge        Equipment Recommendations  Rolling walker with 5" wheels    Recommendations for Other Services     Frequency Min 5X/week    Precautions / Restrictions Precautions Precautions: Fall;Back Precaution Booklet Issued: Yes (comment) Restrictions Weight Bearing Restrictions: No   Pertinent Vitals/Pain "Sore, but not like it was."        Mobility  Bed Mobility Bed Mobility: Rolling Left;Left Sidelying to Sit;Sitting - Scoot to Edge of Bed Rolling Left: 4: Min guard;With rail Left Sidelying to Sit: 4: Min assist;With rails Sitting - Scoot to Edge of Bed: 5: Supervision Details for Bed Mobility Assistance: cues for sequencing and safe technique.   Transfers Transfers: Sit to Stand;Stand to [employer] Transfers Sit to Stand: 4: Min guard;With upper extremity assist;From bed Stand to Sit: 4: Min guard;With upper extremity assist;To chair/3-in-1 Stand Pivot Transfers: 4: Min guard Details for Transfer Assistance: cues for safe techniae and sequencing.  pt indicates feeling lightheaded in standing.   Ambulation/Gait Ambulation/Gait Assistance: Not tested (comment) Stairs: No Wheelchair Mobility Wheelchair Mobility: No    Exercises     PT Diagnosis: Difficulty  walking;Generalized weakness  PT Problem List: Decreased strength;Decreased activity tolerance;Decreased balance;Decreased mobility;Decreased knowledge of use of DME;Decreased knowledge of precautions PT Treatment Interventions: DME instruction;Gait training;Functional mobility training;Therapeutic activities;Therapeutic exercise;Balance training;Neuromuscular re-education;Patient/family education     PT Goals(Current goals can be found in the care plan section) Acute Rehab PT Goals Patient Stated Goal: Home PT Goal Formulation: With patient Time For Goal Achievement: 08/01/13 Potential to Achieve Goals: Good  Visit Information  Last PT Received On: 07/25/13 Assistance Needed: +1 History of Present Illness: pt presents with L3-4 Lami.         Prior Functioning  Home Living Family/patient expects to be discharged to:: Private residence Living Arrangements: Other relatives Available Help at Discharge: Family;Available 24 hours/day Type of Home: House Home Access: Ramped entrance Home Layout: One level Home Equipment: Cane - single point;Bedside commode Prior Function Level of Independence: Independent Communication Communication: No difficulties    Cognition  Cognition Arousal/Alertness: Awake/alert Behavior During Therapy: WFL for tasks assessed/performed Overall Cognitive Status: Within Functional Limits for tasks assessed    Extremity/Trunk Assessment Upper Extremity Assessment Upper Extremity Assessment: Defer to OT evaluation Lower Extremity Assessment Lower Extremity Assessment: Generalized weakness   Balance Balance Balance Assessed: No  End of Session PT - End of Session Equipment Utilized During Treatment: Gait belt Activity Tolerance: Treatment limited secondary to medical complications (Comment) (Lightheaded) Patient left: in chair;with call bell/phone within reach;with family/visitor present Nurse Communication: Mobility status  GP     Sunny Schlein,  [place] [phone] 07/25/2013, 8:37 AM

## 2013-07-25 NOTE — Progress Notes (Addendum)
Subjective: Patient seen and examined at the bedside. It is POD1. Patient is doing very well this morning. She is sitting up in her chair and says she is quite comfortable. She denies pain in her back. She worked with PT just prior to my visit and she says it went well. She is tolerating PO.  Objective: Vital signs in last 24 hours: Filed Vitals:   07/24/13 1400 07/24/13 1800 07/24/13 2143 07/25/13 0700  BP: 144/67 115/54 123/40 145/51  Pulse: 77 90 80 85  Temp: 98.8 F (37.1 C) 98.8 F (37.1 C) 98.5 F (36.9 C) 97.7 F (36.5 C)  TempSrc: Oral Oral Oral Oral  Resp: 16 18 18 20   Height:      Weight:      SpO2: 97% 97% 98% 96%   Weight change:   Intake/Output Summary (Last 24 hours) at 07/25/13 0726 Last data filed at 07/24/13 2000  Gross per 24 hour  Intake    562 ml  Output   1225 ml  Net   -663 ml   Physical Exam:  General: resting in bed, in NAD HEENT: no scleral icterus, MM Cardiac: RRR, no rubs, murmurs or gallops  Pulm: bibasilar lung crackles, scattered respiratory wheezing, no respiratory distress Abd: soft, nontender, nondistended, BS normoactive, no suprapubic tenderness Ext: warm and well perfused, 1+ pretibial edema Back: lumbar paraspinal tenderness Neuro: alert and oriented X3, cranial nerves II-XII grossly intact, moving all extremities   Lab Results: Basic Metabolic Panel:  Recent Labs Lab 07/24/13 0845 07/25/13 0346  NA 132* 128*  K 4.8 5.2*  CL 101 97  CO2 23 23  GLUCOSE 141* 262*  BUN 9 12  CREATININE 1.21* 1.29*  CALCIUM 8.5 8.3*   Liver Function Tests:  Recent Labs Lab 07/20/13 0834  AST 17  ALT 12  ALKPHOS 75  BILITOT 0.5  PROT 7.3  ALBUMIN 3.5   CBC:  Recent Labs Lab 07/20/13 0834 07/24/13 0845 07/25/13 0346  WBC 6.4 4.7 5.9  NEUTROABS 4.8  --   --   HGB 10.8* 9.1* 8.5*  HCT 31.3* 26.6* 24.9*  MCV 84.6 86.4 86.8  PLT 229 223 235   CBG:  Recent Labs Lab 07/23/13 1644 07/23/13 2138 07/24/13 0629 07/24/13 1634  07/24/13 2152 07/25/13 0650  GLUCAP 159* 140* 144* 208* 272* 235*    Studies/Results: Dg Lumbar Spine 1 View  07/24/2013   ADDENDUM REPORT: 07/24/2013 12:14  ADDENDUM: Toni Amend, radiology technologist confirmed that L3-4 is the proper level (per OR nurse)   Electronically Signed   By: Bridgett Larsson M.D.   On: 07/24/2013 12:14   07/24/2013   CLINICAL DATA:  L4-5 laminectomy.  EXAM: LUMBAR SPINE - 1 VIEW  COMPARISON:  07/20/2013.  FINDINGS: Single intraoperative view of the lumbar spine submitted for review after surgery.  Utilizing level assignment of prior plain film examination, Ray cages are located at the L4-5 level.  Surgical spreaders and metallic probe are posterior to the L3-4 level.  Disc degeneration and disc space narrowing L2-3 and L5-S1.  Vascular calcifications. Caliber of aorta incompletely assessed on the present exam.  IMPRESSION: Localization L3-4.  Please see above.  These results will be called to the ordering clinician or representative by the Radiologist Assistant, and communication documented in the PACS Dashboard. .  Electronically Signed: By: Bridgett Larsson M.D. On: 07/24/2013 11:48   Medications: I have reviewed the patient's current medications. Scheduled Meds: . amLODipine  5 mg Oral Daily  . gabapentin  200  mg Oral QHS  . hydrALAZINE  25 mg Oral TID  . insulin aspart  0-15 Units Subcutaneous TID WC  . insulin detemir  5 Units Subcutaneous QHS  . lisinopril  40 mg Oral Daily  . metoprolol succinate  100 mg Oral Daily  . oxybutynin  10 mg Oral QHS  . oxybutynin  10 mg Oral QHS  . oxybutynin  5 mg Oral Daily  . oxybutynin  5 mg Oral Daily  . pantoprazole  40 mg Oral Daily  . polyethylene glycol  17 g Oral Daily  . pravastatin  80 mg Oral Daily  . senna  1 tablet Oral BID  . sodium chloride  3 mL Intravenous Q12H   Continuous Infusions: . sodium chloride     PRN Meds:.acetaminophen, acetaminophen, albuterol, alum & mag hydroxide-simeth, bisacodyl,  HYDROcodone-acetaminophen, HYDROmorphone (DILAUDID) injection, menthol-cetylpyridinium, methocarbamol, ondansetron (ZOFRAN) IV, oxyCODONE, oxyCODONE-acetaminophen, phenol, polyethylene glycol, sodium chloride Assessment/Plan: Kristen Barron is a 79 year old woman with history of HTN, CHF, DM, CKD, and DDD who is admitted on 07/20/13 with worsening low back pain.   #Spinal stenosis of lumbar region/DDD - Neurosurgery feels simple nonoperative management will not be effective. Patient would like to move forward with L3-L4 decompressive laminectomy and microdiscectomy. Because of her aspirin/Plavix use and lack of motor weakness, we decided to wait until 12/5 for the surgery. She is POD#1 s/p 3/4 decompressive laminectomy with bilateral L3 and L4 decompressive foraminotomies. L3-4-left microdiscectomy. - Appreciate neurosurgery recs - Pain control with Norco 5-325 1-2 tabs q4h prn, Tylenol 650mg  q4h prn - Continue Robaxin 500mg  q6h prn, gabapentin 200mg  daily - Carb modified diet - PT is recommending home health PT with 24 hour supervision, rolling walker with 5" wheels  - Will touch base with neurosurgery about discharge planning (she has a wound vac)  #Hyponatremia - Improved with IVF, trend as below. Unclear how long this has been going on, but will remain cautious and avoid correction of >10 in 24h. Serum and urine osms (below, Lasix held for measurement) suggest primary polydipsia vs. Malnutrition. Patient is completely asymptomatic. - IVF NSL since tolerating POs - Serum osmolality > 270 (low) - Calculated osmolality > 268 - Osmolal gap > 2 (wnl) - Urine osmolality > 310 (low) - Urine sodium > 17  Sodium  Date Value Range Status  07/25/2013 128* 135 - 145 mEq/L Final  07/24/2013 132* 135 - 145 mEq/L Final  07/22/2013 130* 135 - 145 mEq/L Final  07/22/2013 128* 135 - 145 mEq/L Final  07/21/2013 125* 135 - 145 mEq/L Final    #Recent UTI - We completed her course of Augmentin. No dysuria.    #CKD 3 - Baseline Cr of 1.31. Cr increased to 1.54, but today it is down to baseline. Likely transient pre-renal azotemia from decreased po intake prior to admission, improved with fluid resuscitation.  - Continue to monitor fluid status and renal function  - NSAID use cautiously   Creatinine, Ser  Date Value Range Status  07/25/2013 1.29* 0.50 - 1.10 mg/dL Final  16/05/9603 5.40* 0.50 - 1.10 mg/dL Final  98/06/9146 8.29* 0.50 - 1.10 mg/dL Final  56/21/3086 5.78* 0.50 - 1.10 mg/dL Final    #DM - I6N 8.2 on 03/23/13 per PCP records. Home regimen includes glipizide and levemir 10u qHS. - Decrease levemir to 5u qHS given decreased PO intake  - SSI   #Hypertension - BP 140s/50s today, but given her age this is appropriate. - Continue home amlodipine, hydralazine, metoprolol and quinapril   #  Normocytic anemia - Hb at baseline on admission (per clinic notes, Hb 10 on 01/18/12, B12 and folate wnl), likely related to CKD. Hgb dropped a point overnight, but this is expected due to acute blood loss from her surgery. - Continue to monitor   Hemoglobin  Date Value Range Status  07/25/2013 8.5* 12.0 - 15.0 g/dL Final  16/05/9603 9.1* 12.0 - 15.0 g/dL Final  54/04/8118 14.7* 12.0 - 15.0 g/dL Final    #h/o CVA - Continue statin. - Will ask neurosurgery when it will be OK to resume ASA, plavix from their standpoint  #H/o CHF - Unclear if systolic or diastolic; she is on BB & ACEI at home. - Cont BB & ACEI  - Resuming home Lasix 20mg  bid give mild hyperkalemia (5.2) and tolerating po fluids  #VTE ppx - heparin TID   #Code status - full code   Dispo: Disposition is deferred at this time, awaiting improvement of current medical problems.  Anticipated discharge in approximately 1-3 day(s).   The patient does have a current PCP ([facility]) and does need an [facility] hospital follow-up appointment after discharge.  The patient does not have transportation limitations  that hinder transportation to clinic appointments.  .Services Needed at time of discharge: Y = Yes, Blank = No PT:   OT:   RN:   Equipment:   Other:     LOS: 5 days   Vivi Barrack, MD 07/25/2013, 7:26 AM

## 2013-07-25 NOTE — Progress Notes (Signed)
Postop day 1. Pain very much improved although patient is experiencing some incisional soreness. She denies any radicular pain. She is having no symptoms of numbness paresthesias or weakness. She was able to stand and space and physical therapy today.  She is afebrile. Her vitals are stable. She is awake and aware. She looks very much better. Motor and sensory function are intact. Wound is clean dry. Drain output is very low.  Doing well following surgery. Continue efforts at mobilization. Patient may be able to be discharged home tomorrow with home therapy.Marland Kitchen

## 2013-07-25 NOTE — Evaluation (Signed)
Occupational Therapy Evaluation Patient Details Name: HENRY DEMERITT MRN: [id number] DOB: August 07, 1929 Today's Date: 07/25/2013 Time: 7829-5621 OT Time Calculation (min): 18 min  OT Assessment / Plan / Recommendation History of present illness 79 yo female s/p L3-4 laminectomy   Clinical Impression   Patient is s/p L3-4 laminectomy surgery resulting in functional limitations due to the deficits listed below (see OT problem list).  Patient will benefit from skilled OT acutely to increase independence and safety with ADLS to allow discharge HHOT.     OT Assessment  Patient needs continued OT Services    Follow Up Recommendations  Home health OT    Barriers to Discharge      Equipment Recommendations  None recommended by OT    Recommendations for Other Services    Frequency  Min 2X/week    Precautions / Restrictions Precautions Precautions: Fall;Back Precaution Comments: handout present and reviewed for adls   Pertinent Vitals/Pain 8 out 10  Medication requested from RN    ADL  Grooming: Wash/dry hands;Min guard Where Assessed - Grooming: Unsupported standing Toilet Transfer: Min Pension scheme manager Method: Sit to Barista: Raised toilet seat with arms (or 3-in-1 over toilet) Toileting - Clothing Manipulation and Hygiene: Min guard Where Assessed - Toileting Clothing Manipulation and Hygiene: Sit to stand from 3-in-1 or toilet Equipment Used: Gait belt;Rolling walker Transfers/Ambulation Related to ADLs: Pt ambuatling with RW. Pt with LOB with head turns and required MIN (A) to correct ADL Comments:  pt able to recall all precautions except "twisting" Pt reports decr ability to recall information (STM). pt demonstrates ability to perform 3n1 transfer but unable to void. PRN medication requested from RN Danny    OT Diagnosis: Generalized weakness;Acute pain  OT Problem List: Decreased strength;Decreased activity tolerance;Impaired balance (sitting  and/or standing);Decreased safety awareness;Decreased knowledge of use of DME or AE;Decreased knowledge of precautions;Pain OT Treatment Interventions: Self-care/ADL training;Therapeutic exercise;DME and/or AE instruction;Therapeutic activities;Patient/family education;Balance training   OT Goals(Current goals can be found in the care plan section) Acute Rehab OT Goals Patient Stated Goal: Home OT Goal Formulation: With patient Time For Goal Achievement: 08/08/13 Potential to Achieve Goals: Good  Visit Information  Last OT Received On: 07/25/13 Assistance Needed: +1 History of Present Illness: 79 yo female s/p L3-4 laminectomy       Prior Functioning     Home Living Family/patient expects to be discharged to:: Private residence Living Arrangements: Other relatives Available Help at Discharge: Family;Available 24 hours/day Type of Home: House Home Access: Ramped entrance Home Layout: One level Home Equipment: Cane - single point;Bedside commode Prior Function Level of Independence: Independent Communication Communication: No difficulties Dominant Hand: Right         Vision/Perception Vision - History Baseline Vision: Wears glasses only for reading Patient Visual Report: No change from baseline   Cognition  Cognition Arousal/Alertness: Awake/alert Behavior During Therapy: WFL for tasks assessed/performed Overall Cognitive Status: Within Functional Limits for tasks assessed    Extremity/Trunk Assessment Upper Extremity Assessment Upper Extremity Assessment: Overall WFL for tasks assessed Lower Extremity Assessment Lower Extremity Assessment: Defer to PT evaluation     Mobility Bed Mobility Bed Mobility: Not assessed Transfers Transfers: Sit to Stand;Stand to Sit Sit to Stand: 4: Min guard;With upper extremity assist;From chair/3-in-1 Stand to Sit: 4: Min guard;With upper extremity assist;To chair/3-in-1 Details for Transfer Assistance: good hand placement and  use of RW     Exercise     Balance Balance Balance Assessed: Yes Static Standing Balance  Static Standing - Balance Support: Bilateral upper extremity supported;During functional activity Static Standing - Level of Assistance: 4: Min assist   End of Session OT - End of Session Activity Tolerance: Patient tolerated treatment well Patient left: in chair;with call bell/phone within reach Nurse Communication: Mobility status;Precautions  GO     [name] 07/25/2013, 1:03 PM Pager: [phone]

## 2013-07-26 DIAGNOSIS — E875 Hyperkalemia: Secondary | ICD-10-CM

## 2013-07-26 LAB — BASIC METABOLIC PANEL
BUN: 16 mg/dL (ref 6–23)
CO2: 24 mEq/L (ref 19–32)
CO2: 23 mEq/L (ref 19–32)
Calcium: 8.6 mg/dL (ref 8.4–10.5)
Creatinine, Ser: 1.27 mg/dL — ABNORMAL HIGH (ref 0.50–1.10)
GFR calc non Af Amer: 35 mL/min — ABNORMAL LOW (ref >90)
Glucose, Bld: 172 mg/dL — ABNORMAL HIGH (ref 70–99)
Potassium: 5.4 mEq/L — ABNORMAL HIGH (ref 3.5–5.1)
Sodium: 128 mEq/L — ABNORMAL LOW (ref 135–145)
Sodium: 125 mEq/L — ABNORMAL LOW (ref 135–145)

## 2013-07-26 LAB — GLUCOSE, CAPILLARY
Glucose-Capillary: 153 mg/dL — ABNORMAL HIGH (ref 70–99)
Glucose-Capillary: 145 mg/dL — ABNORMAL HIGH (ref 70–99)

## 2013-07-26 LAB — CBC
Hemoglobin: 8.5 g/dL — ABNORMAL LOW (ref 12.0–15.0)
MCV: 87.2 fL (ref 78.0–100.0)
RBC: 2.88 MIL/uL — ABNORMAL LOW (ref 3.87–5.11)

## 2013-07-26 MED ORDER — SENNA 8.6 MG PO TABS: 1.0000 | ORAL_TABLET | Freq: Two times a day (BID) | ORAL | Status: DC

## 2013-07-26 MED ORDER — HYDROCODONE-ACETAMINOPHEN 5-325 MG PO TABS: 1.0000 | ORAL_TABLET | ORAL | Status: DC | PRN

## 2013-07-26 MED ORDER — DOCUSATE SODIUM 100 MG PO CAPS
100.0000 mg | ORAL_CAPSULE | Freq: Every day | ORAL | Status: DC
  Filled 2013-07-26: qty 1

## 2013-07-26 MED ORDER — DSS 100 MG PO CAPS: 100.0000 mg | ORAL_CAPSULE | Freq: Every day | ORAL | Status: DC

## 2013-07-26 MED ORDER — PANTOPRAZOLE SODIUM 40 MG PO TBEC: 40.0000 mg | DELAYED_RELEASE_TABLET | Freq: Every day | ORAL | Status: DC

## 2013-07-26 MED ORDER — FUROSEMIDE 20 MG PO TABS
20.0000 mg | ORAL_TABLET | Freq: Once | ORAL | Status: AC
  Administered 2013-07-26: 20 mg via ORAL
  Filled 2013-07-26: qty 1

## 2013-07-26 NOTE — Progress Notes (Signed)
  Date: 07/26/2013  Patient name: Kristen Barron  Medical record number: [id number]  Date of birth: March 18, 1929   This patient has been seen and the plan of care was discussed with the house staff. Please see their note for complete details. I concur with their findings.  Inez Catalina, MD 07/26/2013, 10:55 AM

## 2013-07-26 NOTE — Progress Notes (Addendum)
Subjective: Patient seen and examined at the bedside. It is POD2. Patient is doing very well this morning. She is sitting up in her chair and says she is quite comfortable. She has some pain in her back, but it is well controlled on her oral pain med regimen. She notes some bilateral swelling in her legs, and also tells me she feels constipated. Last BM was 3 days ago. Dr. Jordan Likes will remove her wound vac this am.  Objective: Vital signs in last 24 hours: Filed Vitals:   07/25/13 1400 07/25/13 1800 07/25/13 2142 07/26/13 0607  BP: 127/45 153/58 147/67 142/57  Pulse: 72 75 67 65  Temp: 98.1 F (36.7 C) 98 F (36.7 C) 98.2 F (36.8 C) 97.4 F (36.3 C)  TempSrc: Oral Oral Oral Oral  Resp: 20 20 18 18   Height:      Weight:      SpO2: 98% 96% 100% 98%   Weight change:   Intake/Output Summary (Last 24 hours) at 07/26/13 0819 Last data filed at 07/25/13 2100  Gross per 24 hour  Intake    240 ml  Output    615 ml  Net   -375 ml   Physical Exam:  General: resting in bed, in NAD HEENT: no scleral icterus, MM Cardiac: RRR, no rubs, murmurs or gallops  Pulm: bibasilar lung crackles, scattered respiratory wheezing, no respiratory distress Abd: soft, nontender, nondistended, BS normoactive, no suprapubic tenderness Ext: warm and well perfused, 1+ pretibial edema, SCDs in place Back: lumbar paraspinal tenderness Neuro: alert and oriented X3, cranial nerves II-XII grossly intact, moving all extremities   Lab Results: Basic Metabolic Panel:  Recent Labs Lab 07/25/13 0346 07/26/13 0540  NA 128* 128*  K 5.2* 5.4*  CL 97 96  CO2 23 24  GLUCOSE 262* 172*  BUN 12 16  CREATININE 1.29* 1.38*  CALCIUM 8.3* 8.7   Liver Function Tests:  Recent Labs Lab 07/20/13 0834  AST 17  ALT 12  ALKPHOS 75  BILITOT 0.5  PROT 7.3  ALBUMIN 3.5   CBC:  Recent Labs Lab 07/20/13 0834  07/25/13 0346 07/26/13 0540  WBC 6.4  < > 5.9 8.5  NEUTROABS 4.8  --   --   --   HGB 10.8*  < > 8.5*  8.5*  HCT 31.3*  < > 24.9* 25.1*  MCV 84.6  < > 86.8 87.2  PLT 229  < > 235 244  < > = values in this interval not displayed. CBG:  Recent Labs Lab 07/24/13 2152 07/25/13 0650 07/25/13 1137 07/25/13 1628 07/25/13 2140 07/26/13 0651  GLUCAP 272* 235* 157* 215* 145* 153*    Studies/Results: Dg Lumbar Spine 1 View  07/24/2013   ADDENDUM REPORT: 07/24/2013 12:14  ADDENDUM: Toni Amend, radiology technologist confirmed that L3-4 is the proper level (per OR nurse)   Electronically Signed   By: Bridgett Larsson M.D.   On: 07/24/2013 12:14   07/24/2013   CLINICAL DATA:  L4-5 laminectomy.  EXAM: LUMBAR SPINE - 1 VIEW  COMPARISON:  07/20/2013.  FINDINGS: Single intraoperative view of the lumbar spine submitted for review after surgery.  Utilizing level assignment of prior plain film examination, Ray cages are located at the L4-5 level.  Surgical spreaders and metallic probe are posterior to the L3-4 level.  Disc degeneration and disc space narrowing L2-3 and L5-S1.  Vascular calcifications. Caliber of aorta incompletely assessed on the present exam.  IMPRESSION: Localization L3-4.  Please see above.  These results will be  called to the ordering clinician or representative by the Radiologist Assistant, and communication documented in the PACS Dashboard. .  Electronically Signed: By: Bridgett Larsson M.D. On: 07/24/2013 11:48   Medications: I have reviewed the patient's current medications. Scheduled Meds: . amLODipine  5 mg Oral Daily  . aspirin EC  81 mg Oral Daily  . clopidogrel  75 mg Oral Q breakfast  . docusate sodium  100 mg Oral Daily  . furosemide  20 mg Oral BID  . furosemide  20 mg Oral Once  . gabapentin  200 mg Oral QHS  . hydrALAZINE  25 mg Oral TID  . insulin aspart  0-15 Units Subcutaneous TID WC  . insulin detemir  5 Units Subcutaneous QHS  . lisinopril  40 mg Oral Daily  . metoprolol succinate  100 mg Oral Daily  . oxybutynin  10 mg Oral QHS  . oxybutynin  10 mg Oral QHS  .  oxybutynin  5 mg Oral Daily  . oxybutynin  5 mg Oral Daily  . pantoprazole  40 mg Oral Daily  . polyethylene glycol  17 g Oral Daily  . pravastatin  80 mg Oral Daily  . senna  1 tablet Oral BID  . sodium chloride  3 mL Intravenous Q12H   Continuous Infusions: . sodium chloride     PRN Meds:.acetaminophen, acetaminophen, albuterol, alum & mag hydroxide-simeth, bisacodyl, HYDROcodone-acetaminophen, menthol-cetylpyridinium, methocarbamol, ondansetron (ZOFRAN) IV, phenol, polyethylene glycol, sodium chloride Assessment/Plan: Ms. Spanbauer is a 79 year old woman with history of HTN, CHF, DM, CKD, and DDD who is admitted on 07/20/13 with worsening low back pain.   #Spinal stenosis of lumbar region/DDD - Neurosurgery feels simple nonoperative management will not be effective. Patient would like to move forward with L3-L4 decompressive laminectomy and microdiscectomy. Because of her aspirin/Plavix use and lack of motor weakness, we decided to wait until 12/5 for the surgery. She is POD#1 s/p 3/4 decompressive laminectomy with bilateral L3 and L4 decompressive foraminotomies. L3-4-left microdiscectomy. Per Dr. Jordan Likes her wound vac will be removed this morning. - Appreciate neurosurgery recs - Pain control with Norco 5-325 1-2 tabs q4h prn, Tylenol 650mg  q4h prn - Continue Robaxin 500mg  q6h prn, gabapentin 200mg  daily - Carb modified diet - PT is recommending home health PT with 24 hour supervision, rolling walker with 5" wheels   #Mild hyperkalema - K = 5.4 this am. We held her Lasix in the immediate pre- and post-op period. Some mild LE edema noted this morning. - Resume Lasix 20mg  BID - Extra 20mg  po this am - Checking EKG > wnl, no peaked T waves or prolonged QRS, sinus - Repeat BMP at noon - Medically stable for discharge home today pending results  Potassium  Date Value Range Status  07/26/2013 5.4* 3.5 - 5.1 mEq/L Final  07/25/2013 5.2* 3.5 - 5.1 mEq/L Final  07/24/2013 4.8  3.5 - 5.1 mEq/L  Final  07/22/2013 5.0  3.5 - 5.1 mEq/L Final  07/22/2013 4.7  3.5 - 5.1 mEq/L Final    #Constipation - Patient complaining of some constipation this am, to be expected given narcotic pain control and recent surgery causing immobility. - Colace 100mg  po daily - Miralax 17g po daily - Senna 8.6mg  po BID - Dulcolax suppository 10mg  rectal daily prn  #Hyponatremia - Improved with IVF, trend as below. Unclear how long this has been going on, but will remain cautious and avoid correction of >10 in 24h. Serum and urine osms (below, Lasix held for measurement)  suggest primary polydipsia vs. Malnutrition. Patient is completely asymptomatic. - Serum osmolality > 270 (low) - Calculated osmolality > 268 - Osmolal gap > 2 (wnl) - Urine osmolality > 310 (low) - Urine sodium > 17  Sodium  Date Value Range Status  07/26/2013 128* 135 - 145 mEq/L Final  07/25/2013 128* 135 - 145 mEq/L Final  07/24/2013 132* 135 - 145 mEq/L Final  07/22/2013 130* 135 - 145 mEq/L Final  07/22/2013 128* 135 - 145 mEq/L Final    #Recent UTI - We completed her course of Augmentin. No dysuria.   #CKD 3 - Baseline Cr of 1.31. Cr increased to 1.54 on admission, but it is now at baseline (1.38). Likely transient pre-renal azotemia from decreased po intake prior to admission, improved with fluid resuscitation.  - Continue to monitor fluid status and renal function  - NSAID use cautiously   #DM - A1c 8.2 on 03/23/13 per PCP records. Home regimen includes glipizide and levemir 10u qHS. - Decrease levemir to 5u qHS given decreased PO intake  - SSI   #Hypertension - BP 140s/60s today, but given her age this is appropriate. - Continue home amlodipine, hydralazine, metoprolol and quinapril   #Normocytic anemia - Hb at baseline on admission (per clinic notes, Hb 10 on 01/18/12, B12 and folate wnl), likely related to CKD. Hgb dropped a point on 12/16, but this is expected due to acute blood loss from her surgery. It is stable  today. - Continue to monitor   Hemoglobin  Date Value Range Status  07/26/2013 8.5* 12.0 - 15.0 g/dL Final  45/40/9811 8.5* 12.0 - 15.0 g/dL Final  91/47/8295 9.1* 12.0 - 15.0 g/dL Final  62/13/0865 78.4* 12.0 - 15.0 g/dL Final    #h/o CVA - Continue statin. - Resume ASA, plavix (OK'ed by neurosurgery)  #H/o CHF - Unclear if systolic or diastolic; she is on BB & ACEI at home. - Cont BB & ACEI  - Resuming home Lasix 20mg  bid give mild hyperkalemia  #VTE ppx - heparin TID   #Code status - full code   Dispo: Disposition is deferred at this time, awaiting improvement of current medical problems.  Anticipated discharge in approximately 1-3 day(s).   The patient does have a current PCP ([facility]) and does need an [facility] hospital follow-up appointment after discharge.  The patient does not have transportation limitations that hinder transportation to clinic appointments.  .Services Needed at time of discharge: Y = Yes, Blank = No PT:   OT:   RN:   Equipment:   Other:     LOS: 6 days   Vivi Barrack, MD 07/26/2013, 8:19 AM

## 2013-07-26 NOTE — Progress Notes (Signed)
Discharge instructions given. Pt verbalized understanding

## 2013-07-26 NOTE — Progress Notes (Signed)
Talked to patient about DCP; patient lives with family and plans to return home at discharge; [facility] choices offered, patient chose [facility]; Mary with [facility] called for arrangements; Alexis Goodell [phone]

## 2013-07-26 NOTE — Progress Notes (Signed)
Patient ID: Kristen Barron, female   DOB: 23-May-1929, 79 y.o.   MRN: [id number] Postop day 2. Patient continues to be much improved from preop. Back pain only. No leg pain. Patient able to stand and walk with therapy. Overall she is happy with her progress.  Afebrile. Vitals are stable. Wound clean and dry. Drain output minimal. Awake and alert. Oriented and appropriate. Motor sensory function intact. Chest and abdomen benign.  Doing well following lumbar decompression and bilateral microdiscectomy. Patient is cleared for discharge home today with home physical and occupational therapy. She should follow up with me in 2 weeks.

## 2013-07-26 NOTE — Progress Notes (Signed)
Physical Therapy Treatment Patient Details Name: Kristen Barron MRN: [id number] DOB: Jun 15, 1929 Today's Date: 07/26/2013 Time: 4098-1191 PT Time Calculation (min): 24 min  PT Assessment / Plan / Recommendation  History of Present Illness 79 yo female s/p L3-4 laminectomy   PT Comments   Patient making great progress today. Able to ambulate in hallway. No complains of dizziness. Patient concerned over not having BM. Possibly DC today per RN and MD  Follow Up Recommendations  Home health PT;Supervision/Assistance - 24 hour     Does the patient have the potential to tolerate intense rehabilitation     Barriers to Discharge        Equipment Recommendations  Rolling walker with 5" wheels    Recommendations for Other Services    Frequency Min 5X/week   Progress towards PT Goals Progress towards PT goals: Progressing toward goals  Plan Current plan remains appropriate    Precautions / Restrictions Precautions Precautions: Fall;Back Precaution Comments: reviewed handout and precautions with patient as she was unable to recall    Pertinent Vitals/Pain no apparent distress     Mobility  Bed Mobility Rolling Left: 4: Min guard;With rail Left Sidelying to Sit: 4: Min assist;With rails Details for Bed Mobility Assistance: cues for sequencing and safe technique.   Transfers Sit to Stand: 4: Min guard;With upper extremity assist;From chair/3-in-1 Stand to Sit: 4: Min guard;With upper extremity assist;To chair/3-in-1 Details for Transfer Assistance: good hand placement and use of RW Ambulation/Gait Ambulation/Gait Assistance: 4: Min guard Ambulation Distance (Feet): 150 Feet Assistive device: Rolling walker Ambulation/Gait Assistance Details: Cues for posture and RW management Gait Pattern: Step-through pattern;Decreased stride length    Exercises     PT Diagnosis:    PT Problem List:   PT Treatment Interventions:     PT Goals (current goals can now be found in the care plan  section)    Visit Information  Last PT Received On: 07/26/13 Assistance Needed: +1 History of Present Illness: 79 yo female s/p L3-4 laminectomy    Subjective Data      Cognition  Cognition Arousal/Alertness: Awake/alert Behavior During Therapy: WFL for tasks assessed/performed Overall Cognitive Status: Within Functional Limits for tasks assessed    Balance     End of Session PT - End of Session Equipment Utilized During Treatment: Gait belt Activity Tolerance: Patient tolerated treatment well Patient left: in chair;with call bell/phone within reach;with family/visitor present Nurse Communication: Mobility status   GP     Fredrich Birks 07/26/2013, 9:33 AM  07/26/2013 Fredrich Birks PTA [phone] pager [phone] office

## 2013-07-26 NOTE — Progress Notes (Signed)
Occupational Therapy Treatment Patient Details Name: Kristen Barron MRN: [id number] DOB: 1929/01/09 Today's Date: 07/26/2013 Time: 4098-1191 OT Time Calculation (min): 23 min  OT Assessment / Plan / Recommendation  History of present illness 79 yo female s/p L3-4 laminectomy   OT comments  Pt with pending d/c today and educated on AE for LB dressing. Pt plans to purchase AE today.  Follow Up Recommendations  Home health OT    Barriers to Discharge       Equipment Recommendations  None recommended by OT    Recommendations for Other Services    Frequency Min 2X/week   Progress towards OT Goals Progress towards OT goals: Progressing toward goals  Plan Discharge plan remains appropriate    Precautions / Restrictions Precautions Precautions: Fall;Back Precaution Comments: reviewed handout and precautions with patient as she was unable to recall    Pertinent Vitals/Pain DOE      ADL  Lower Body Dressing: Min guard Where Assessed - Lower Body Dressing: Unsupported sit to stand Equipment Used: Rolling walker Transfers/Ambulation Related to ADLs: Pt compelted sit<>stand Min guard (A) ADL Comments:  Pt educated on reacher for lb dressing and demonstrated. pt dressing for prep for d/c home today. pt sending family to purchase reacher today. pt with DOE with LB dressing. pt able to cross BIL LE but unable to maintain at this time due to edema in bil LE. pt educated on car transfer and discussed bed mobility. pt able to recall 2 out 3 precautions. pt unable to recall twisting.    OT Diagnosis:    OT Problem List:   OT Treatment Interventions:     OT Goals(current goals can now be found in the care plan section) Acute Rehab OT Goals Patient Stated Goal: Home OT Goal Formulation: With patient Time For Goal Achievement: 08/08/13 Potential to Achieve Goals: Good ADL Goals Pt Will Perform Grooming: with supervision;standing Pt Will Perform Upper Body Bathing: with  supervision;standing Pt Will Perform Lower Body Dressing: with supervision;with adaptive equipment;sit to/from stand Pt Will Transfer to Toilet: with supervision;ambulating;bedside commode Pt Will Perform Toileting - Clothing Manipulation and hygiene: with supervision;sit to/from stand  Visit Information  Last OT Received On: 07/26/13 Assistance Needed: +1 History of Present Illness: 79 yo female s/p L3-4 laminectomy    Subjective Data      Prior Functioning       Cognition  Cognition Arousal/Alertness: Awake/alert Behavior During Therapy: WFL for tasks assessed/performed Overall Cognitive Status: Within Functional Limits for tasks assessed Memory: Decreased recall of precautions    Mobility  Bed Mobility Bed Mobility: Not assessed Transfers Transfers: Sit to Stand;Stand to Sit Sit to Stand: 4: Min guard;With upper extremity assist;From chair/3-in-1 Stand to Sit: 4: Min guard;With upper extremity assist;To chair/3-in-1 Details for Transfer Assistance:   required the use of BIL UE on chair    Exercises      Balance     End of Session OT - End of Session Activity Tolerance: Patient tolerated treatment well Patient left: in chair;with call bell/phone within reach Nurse Communication: Mobility status;Precautions  GO     [name] 07/26/2013, 3:38 PM Pager: [phone]

## 2013-07-26 NOTE — Discharge Instructions (Signed)
It was a pleasure taking care of you. - Please follow up with your primary care doctor and your surgeon, Dr. Jordan Likes, as above. - I have prescribed you some medications for pain, please take these as indicated and talk to your doctor if you feel you need additional pain control. - I have also prescribed you some medicines for constipation. You pain medicine can make you constipated, so it is important to take these regularly while on them. - Please stop taking Prilosec (Omeprazole) for GERD. This medicine interacts with Plavix. I have replaced it with a different medicine in the same class, called Protonix. - If you develop sudden weakness, numbness, loss of bowel or bladder control, please return to the ED.  Wound Care Keep incision covered and dry for two days.  If you shower, cover incision with plastic wrap.  Do not put any creams, lotions, or ointments on incision. Leave steri-strips on back.  They will fall off by themselves. Activity Walk each and every day, increasing distance each day. No lifting greater than 5 lbs.  Avoid excessive neck motion. No driving for 2 weeks; may ride as a passenger locally. If provided with back brace, wear when out of bed.  It is not necessary to wear brace in bed. Diet Resume your normal diet.  Return to Work Will be discussed at you follow up appointment. Call Your Doctor If Any of These Occur Redness, drainage, or swelling at the wound.  Temperature greater than 101 degrees. Severe pain not relieved by pain medication. Incision starts to come apart. Follow Up Appt Call today for appointment in 1-2 weeks ([phone]) or for problems.  If you have any hardware placed in your spine, you will need an x-ray before your appointment.

## 2013-07-28 LAB — GLUCOSE, CAPILLARY: Glucose-Capillary: 159 mg/dL — ABNORMAL HIGH (ref 70–99)

## 2013-08-01 NOTE — Discharge Summary (Signed)
I saw Ms. Kristen Barron on day of discharge.  Please note that her d/c summary lists 2 narcotics (percocet and vicoden).  She should only be taking one as needed for her back pain.  Would recommend PCP do good medication reconciliation at hospital follow up appointment

## 2013-09-11 ENCOUNTER — Encounter (HOSPITAL_COMMUNITY): Payer: Self-pay | Admitting: Emergency Medicine

## 2013-09-11 ENCOUNTER — Emergency Department (HOSPITAL_COMMUNITY)
Admission: EM | Admit: 2013-09-11 | Discharge: 2013-09-11 | Disposition: A | Discharge status: Home / Self Care | Payer: Medicare Other | Attending: Emergency Medicine | Admitting: Emergency Medicine

## 2013-09-11 DIAGNOSIS — M549 Dorsalgia, unspecified: Secondary | ICD-10-CM | POA: Insufficient documentation

## 2013-09-11 DIAGNOSIS — R197 Diarrhea, unspecified: Secondary | ICD-10-CM | POA: Insufficient documentation

## 2013-09-11 DIAGNOSIS — I509 Heart failure, unspecified: Secondary | ICD-10-CM | POA: Insufficient documentation

## 2013-09-11 DIAGNOSIS — E119 Type 2 diabetes mellitus without complications: Secondary | ICD-10-CM | POA: Insufficient documentation

## 2013-09-11 DIAGNOSIS — R1033 Periumbilical pain: Secondary | ICD-10-CM | POA: Insufficient documentation

## 2013-09-11 DIAGNOSIS — Z8673 Personal history of transient ischemic attack (TIA), and cerebral infarction without residual deficits: Secondary | ICD-10-CM | POA: Insufficient documentation

## 2013-09-11 DIAGNOSIS — I10 Essential (primary) hypertension: Secondary | ICD-10-CM | POA: Insufficient documentation

## 2013-09-11 DIAGNOSIS — Z79899 Other long term (current) drug therapy: Secondary | ICD-10-CM | POA: Insufficient documentation

## 2013-09-11 DIAGNOSIS — Z7982 Long term (current) use of aspirin: Secondary | ICD-10-CM | POA: Insufficient documentation

## 2013-09-11 DIAGNOSIS — C801 Malignant (primary) neoplasm, unspecified: Secondary | ICD-10-CM | POA: Insufficient documentation

## 2013-09-11 DIAGNOSIS — R112 Nausea with vomiting, unspecified: Secondary | ICD-10-CM | POA: Insufficient documentation

## 2013-09-11 DIAGNOSIS — G8929 Other chronic pain: Secondary | ICD-10-CM | POA: Insufficient documentation

## 2013-09-11 DIAGNOSIS — Z7901 Long term (current) use of anticoagulants: Secondary | ICD-10-CM | POA: Insufficient documentation

## 2013-09-11 DIAGNOSIS — N289 Disorder of kidney and ureter, unspecified: Secondary | ICD-10-CM | POA: Insufficient documentation

## 2013-09-11 LAB — URINALYSIS, ROUTINE W REFLEX MICROSCOPIC
Bilirubin Urine: NEGATIVE
Glucose, UA: NEGATIVE mg/dL
Hgb urine dipstick: NEGATIVE
Ketones, ur: NEGATIVE mg/dL
Leukocytes, UA: NEGATIVE
NITRITE: NEGATIVE
Protein, ur: NEGATIVE mg/dL
SPECIFIC GRAVITY, URINE: 1.01 (ref 1.005–1.030)
UROBILINOGEN UA: 0.2 mg/dL (ref 0.0–1.0)
pH: 5 (ref 5.0–8.0)

## 2013-09-11 LAB — COMPREHENSIVE METABOLIC PANEL
ALT: 10 U/L (ref 0–35)
AST: 13 U/L (ref 0–37)
Albumin: 3 g/dL — ABNORMAL LOW (ref 3.5–5.2)
Alkaline Phosphatase: 67 U/L (ref 39–117)
BUN: 22 mg/dL (ref 6–23)
CALCIUM: 8.5 mg/dL (ref 8.4–10.5)
CO2: 23 mEq/L (ref 19–32)
CREATININE: 1.29 mg/dL — AB (ref 0.50–1.10)
Chloride: 96 mEq/L (ref 96–112)
GFR calc non Af Amer: 37 mL/min — ABNORMAL LOW (ref >90)
GFR, EST AFRICAN AMERICAN: 43 mL/min — AB (ref >90)
GLUCOSE: 181 mg/dL — AB (ref 70–99)
Potassium: 4.2 mEq/L (ref 3.7–5.3)
Sodium: 133 mEq/L — ABNORMAL LOW (ref 137–147)
TOTAL PROTEIN: 6.5 g/dL (ref 6.0–8.3)
Total Bilirubin: 0.3 mg/dL (ref 0.3–1.2)

## 2013-09-11 LAB — CBC WITH DIFFERENTIAL/PLATELET
Basophils Absolute: 0 10*3/uL (ref 0.0–0.1)
Basophils Relative: 0 % (ref 0–1)
EOS ABS: 0.5 10*3/uL (ref 0.0–0.7)
Eosinophils Relative: 7 % — ABNORMAL HIGH (ref 0–5)
HCT: 29.8 % — ABNORMAL LOW (ref 36.0–46.0)
HEMOGLOBIN: 9.8 g/dL — AB (ref 12.0–15.0)
LYMPHS ABS: 1.5 10*3/uL (ref 0.7–4.0)
Lymphocytes Relative: 23 % (ref 12–46)
MCH: 27.8 pg (ref 26.0–34.0)
MCHC: 32.9 g/dL (ref 30.0–36.0)
MCV: 84.7 fL (ref 78.0–100.0)
MONO ABS: 0.4 10*3/uL (ref 0.1–1.0)
MONOS PCT: 6 % (ref 3–12)
Neutro Abs: 4.3 10*3/uL (ref 1.7–7.7)
Neutrophils Relative %: 64 % (ref 43–77)
Platelets: 207 10*3/uL (ref 150–400)
RBC: 3.52 MIL/uL — AB (ref 3.87–5.11)
RDW: 14.6 % (ref 11.5–15.5)
WBC: 6.7 10*3/uL (ref 4.0–10.5)

## 2013-09-11 LAB — LIPASE, BLOOD: LIPASE: 11 U/L (ref 11–59)

## 2013-09-11 LAB — CG4 I-STAT (LACTIC ACID): Lactic Acid, Venous: 1.3 mmol/L (ref 0.5–2.2)

## 2013-09-11 MED ORDER — SODIUM CHLORIDE 0.9 % IV SOLN
INTRAVENOUS | Status: DC
  Administered 2013-09-11: 09:00:00 via INTRAVENOUS

## 2013-09-11 MED ORDER — ONDANSETRON 4 MG PO TBDP: 4.0000 mg | ORAL_TABLET | Freq: Three times a day (TID) | ORAL | Status: DC | PRN

## 2013-09-11 MED ORDER — ONDANSETRON HCL 4 MG/2ML IJ SOLN
4.0000 mg | Freq: Once | INTRAMUSCULAR | Status: AC
Start: 2013-09-11 — End: 2013-09-11
  Administered 2013-09-11: 4 mg via INTRAVENOUS
  Filled 2013-09-11: qty 2

## 2013-09-11 NOTE — ED Notes (Signed)
Pt c/o nausea since 230 am. sts she vomited about 6 times. Denies abd pain. C/o chronic back pain. Denies fever/chills at home. Nad, skin warm and dry, resp e/u. Reports some family member were sick last week.

## 2013-09-11 NOTE — ED Notes (Signed)
Pharmacy tech at bedside 

## 2013-09-11 NOTE — ED Notes (Signed)
PA at bedside.

## 2013-09-11 NOTE — ED Provider Notes (Signed)
Medical screening examination/treatment/procedure(s) were performed by non-physician practitioner and as supervising physician I was immediately available for consultation/collaboration. The patient is an elderly female who presents with complaints of nausea and vomiting and diarrhea that started approximately 6 hours prior to arrival. She denies abdominal pain, bloody stool, high fever. She has been around other family members with similar symptoms recently.  On exam vitals are stable and she is afebrile. Head is atraumatic normocephalic. Mucous membranes are moist. Heart is regular rate and rhythm and lungs are clear. Abdomen is soft, nontender. Extremities are without edema.  Presentation, exam, and workup are consistent with a viral gastroenteritis. She was given IV fluids and anti-medics and is feeling better. There is no indication for further testing or hospitalization patient appears stable for discharge. She understands to return if her symptoms substantially worsens or she develop any other new or concerning symptoms.     Veryl Speak, MD 09/11/13 [phone]

## 2013-09-11 NOTE — Discharge Instructions (Signed)
Please read and follow all provided instructions.  Your diagnoses today include:  1. Nausea vomiting and diarrhea     Tests performed today include:  Blood counts and electrolytes  Blood tests to check liver and kidney function  Urine test to look for infection  Vital signs. See below for your results today.   Medications prescribed:   Zofran (ondansetron) - for nausea and vomiting  Take any prescribed medications only as directed.  Home care instructions:   Follow any educational materials contained in this packet.   Your abdominal pain, nausea, vomiting, and diarrhea may be caused by a viral gastroenteritis also called 'stomach flu'. You should rest for the next several days. Keep drinking plenty of fluids and use the medicine for nausea as directed.    Drink clear liquids for the next 24 hours and introduce solid foods slowly after 24 hours using the b.r.a.t. diet (Bananas, Rice, Applesauce, Toast, Yogurt).    Follow-up instructions: Please follow-up with your primary care provider in the next 2 days for further evaluation of your symptoms. If you are not feeling better in 48 hours you may have a condition that is more serious and you need re-evaluation. If you do not have a primary care doctor -- see below for referral information.   Return instructions:  SEEK IMMEDIATE MEDICAL ATTENTION IF:  If you have pain that does not go away or becomes severe   A temperature above 101F develops   Repeated vomiting occurs (multiple episodes)   If you have pain that becomes localized to portions of the abdomen. The right side could possibly be appendicitis. In an adult, the left lower portion of the abdomen could be colitis or diverticulitis.   Blood is being passed in stools or vomit (bright red or black tarry stools)   You develop chest pain, difficulty breathing, dizziness or fainting, or become confused, poorly responsive, or inconsolable (young children)  If you have any  other emergent concerns regarding your health  Additional Information: Abdominal (belly) pain can be caused by many things. Your caregiver performed an examination and possibly ordered blood/urine tests and imaging (CT scan, x-rays, ultrasound). Many cases can be observed and treated at home after initial evaluation in the emergency department. Even though you are being discharged home, abdominal pain can be unpredictable. Therefore, you need a repeated exam if your pain does not resolve, returns, or worsens. Most patients with abdominal pain don't have to be admitted to the hospital or have surgery, but serious problems like appendicitis and gallbladder attacks can start out as nonspecific pain. Many abdominal conditions cannot be diagnosed in one visit, so follow-up evaluations are very important.  Your vital signs today were: BP 133/47   Pulse 74   Temp(Src) 98.4 F (36.9 C) (Oral)   Resp 19   Ht 5\' 2"  (1.575 m)   Wt 199 lb (90.266 kg)   BMI 36.39 kg/m2   SpO2 97% If your blood pressure (bp) was elevated above 135/85 this visit, please have this repeated by your doctor within one month. --------------

## 2013-09-11 NOTE — ED Provider Notes (Signed)
CSN: [id number]     Arrival date & time 09/11/13  0815 History   First MD Initiated Contact with Patient 09/11/13 [phone]     Chief Complaint  Patient presents with  . Emesis  . Nausea   (Consider location/radiation/quality/duration/timing/severity/associated sxs/prior Treatment) HPI Comments: Patient with history of CHF, diabetes, multiple abdominal surgeries, chronic back pain -- presents with complaint of nausea, vomiting, diarrhea that began at approximately 2:30 this morning. Patient vomited approximately 8 times. No fever. Patient has some mild abdominal soreness that is nonlocalized. No treatment prior to arrival. Vomit was nonbloody, nonbilious. Diarrhea was watery, no blood. Sick contacts with similar symptoms 2 weeks ago. The onset of this condition was acute. The course is constant. Aggravating factors: none. Alleviating factors: none.    The history is provided by the patient and medical records.    Past Medical History  Diagnosis Date  . Hypertension   . CHF (congestive heart failure)   . Stroke     August 2014, but has had strokes prior as well  . Cancer     Reports lumpectomy for a cyst  . Renal disorder   . Diabetes    Past Surgical History  Procedure Laterality Date  . Abdominal hysterectomy      Patient not clear as to why  . Back surgery    . Hand surgery Right     Carpel tunnel release in the 1970s  . Lumbar laminectomy/decompression microdiscectomy Bilateral 07/24/2013    Procedure: LUMBAR LAMINECTOMY/DECOMPRESSION MICRODISCECTOMY LUMBAR THREE-FOUR;  Surgeon: Charlie Pitter, MD;  Location: [facility];  Service: Neurosurgery;  Laterality: Bilateral;   No family history on file. History  Substance Use Topics  . Smoking status: Never Smoker   . Smokeless tobacco: Never Used  . Alcohol Use: No   OB History   Grav Para Term Preterm Abortions TAB SAB Ect Mult Living                 Review of Systems  Constitutional: Negative for fever.  HENT: Negative for  rhinorrhea and sore throat.   Eyes: Negative for redness.  Respiratory: Negative for cough.   Cardiovascular: Negative for chest pain.  Gastrointestinal: Positive for nausea, vomiting, abdominal pain and diarrhea.  Genitourinary: Negative for dysuria.  Musculoskeletal: Positive for back pain (baseline, chronic). Negative for myalgias.  Skin: Negative for rash.  Neurological: Negative for headaches.    Allergies  Review of patient's allergies indicates no known allergies.  Home Medications   Current Outpatient Rx  Name  Route  Sig  Dispense  Refill  . amLODipine (NORVASC) 5 MG tablet   Oral   Take 5 mg by mouth daily.         Marland Kitchen aspirin EC 81 MG tablet   Oral   Take 81 mg by mouth daily.         . Calcium-Magnesium-Vitamin D (CALCIUM 500 PO)   Oral   Take 500 mg by mouth daily.         . clopidogrel (PLAVIX) 75 MG tablet   Oral   Take 75 mg by mouth daily with breakfast.         . docusate sodium 100 MG CAPS   Oral   Take 100 mg by mouth daily.   30 capsule   0   . furosemide (LASIX) 20 MG tablet   Oral   Take 20 mg by mouth 2 (two) times daily.         Marland Kitchen  glipiZIDE (GLUCOTROL XL) 10 MG 24 hr tablet   Oral   Take 10 mg by mouth 2 (two) times daily.         . hydrALAZINE (APRESOLINE) 25 MG tablet   Oral   Take 25 mg by mouth 3 (three) times daily.         Marland Kitchen HYDROcodone-acetaminophen (NORCO/VICODIN) 5-325 MG per tablet   Oral   Take 1-2 tablets by mouth every 4 (four) hours as needed for moderate pain.   30 tablet   0   . metoprolol succinate (TOPROL-XL) 100 MG 24 hr tablet   Oral   Take 100 mg by mouth daily. Take with or immediately following a meal.         . omega-3 acid ethyl esters (LOVAZA) 1 G capsule   Oral   Take 1 g by mouth daily.         Marland Kitchen oxybutynin (DITROPAN) 5 MG tablet   Oral   Take 5-10 mg by mouth 2 (two) times daily. Takes 5mg  in the morning and 10mg  in the evening         . oxyCODONE-acetaminophen (PERCOCET)  7.5-325 MG per tablet   Oral   Take 1 tablet by mouth every 4 (four) hours as needed for pain.         . pantoprazole (PROTONIX) 40 MG tablet   Oral   Take 1 tablet (40 mg total) by mouth daily.   30 tablet   3   . pravastatin (PRAVACHOL) 80 MG tablet   Oral   Take 80 mg by mouth daily.         . quinapril (ACCUPRIL) 40 MG tablet   Oral   Take 40 mg by mouth at bedtime.         . senna (SENOKOT) 8.6 MG TABS tablet   Oral   Take 1 tablet (8.6 mg total) by mouth 2 (two) times daily.   60 each   0   . traZODone (DESYREL) 50 MG tablet   Oral   Take 50-100 mg by mouth at bedtime as needed for sleep.          BP 143/43  Pulse 72  Temp(Src) 98.4 F (36.9 C) (Oral)  Resp 16  Ht 5\' 2"  (1.575 m)  Wt 199 lb (90.266 kg)  BMI 36.39 kg/m2  SpO2 98% Physical Exam  Nursing note and vitals reviewed. Constitutional: She appears well-developed and well-nourished.  HENT:  Head: Normocephalic and atraumatic.  Eyes: Conjunctivae are normal. Right eye exhibits no discharge. Left eye exhibits no discharge.  Neck: Normal range of motion. Neck supple.  Cardiovascular: Normal rate, regular rhythm and normal heart sounds.   Pulmonary/Chest: Effort normal and breath sounds normal.  Abdominal: Soft. She exhibits no distension. Bowel sounds are increased. There is tenderness. There is no rebound and no guarding.  Mild periumbilical tenderness, non-focal.   Neurological: She is alert.  Skin: Skin is warm and dry.  Psychiatric: She has a normal mood and affect.    ED Course  Procedures (including critical care time) Labs Review Labs Reviewed - No data to display Imaging Review No results found.  EKG Interpretation   None      8:25 AM Patient seen and examined. Work-up initiated. Medications ordered. D/w Dr. Stark Jock.  Vital signs reviewed and are as follows: Filed Vitals:   09/11/13 0826  BP: 91/72  Temp: 98.4 F (36.9 C)  Resp: 20   Patient seen by Dr. Stark Jock. Labs  reviewed.   Pt counseled on clear liquids. D/c to home with zofran.   The patient was urged to return to the Emergency Department immediately with worsening of current symptoms, worsening abdominal pain, persistent vomiting, blood noted in stools, fever, or any other concerns. The patient verbalized understanding.     MDM   1. Nausea vomiting and diarrhea    Patient with symptoms consistent with viral gastroenteritis.  Vitals are stable, no fever.  No signs of dehydration, tolerating PO's.  Lungs are clear.  No focal abdominal pain, no concern for appendicitis, cholecystitis, pancreatitis, ruptured viscus, UTI, kidney stone, or any other serious abdominal etiology. Lactate not elevated and do not suspect mesenteric ischemia with patient presentation. Supportive therapy indicated with return if symptoms worsen.  Patient counseled.     Carlisle Cater, PA-C 09/11/13 1257

## 2013-09-11 NOTE — ED Notes (Signed)
Results of lactic acid called to nurse thru secretary Rise Paganini

## 2013-11-20 ENCOUNTER — Emergency Department: Payer: Self-pay | Admitting: Emergency Medicine

## 2013-11-20 LAB — CBC WITH DIFFERENTIAL/PLATELET
Basophil #: 0.1 10*3/uL (ref 0.0–0.1)
Basophil %: 1.4 %
Eosinophil #: 0.4 10*3/uL (ref 0.0–0.7)
Eosinophil %: 6.8 %
HCT: 31.4 % — ABNORMAL LOW (ref 35.0–47.0)
HGB: 10.1 g/dL — AB (ref 12.0–16.0)
LYMPHS PCT: 28.1 %
Lymphocyte #: 1.5 10*3/uL (ref 1.0–3.6)
MCH: 27.1 pg (ref 26.0–34.0)
MCHC: 32.1 g/dL (ref 32.0–36.0)
MCV: 84 fL (ref 80–100)
MONOS PCT: 6.9 %
Monocyte #: 0.4 x10 3/mm (ref 0.2–0.9)
Neutrophil #: 3.1 10*3/uL (ref 1.4–6.5)
Neutrophil %: 56.8 %
Platelet: 268 10*3/uL (ref 150–440)
RBC: 3.73 10*6/uL — AB (ref 3.80–5.20)
RDW: 16.1 % — ABNORMAL HIGH (ref 11.5–14.5)
WBC: 5.4 10*3/uL (ref 3.6–11.0)

## 2013-11-20 LAB — COMPREHENSIVE METABOLIC PANEL
ALBUMIN: 3.2 g/dL — AB (ref 3.4–5.0)
ALK PHOS: 87 U/L
Anion Gap: 8 (ref 7–16)
BILIRUBIN TOTAL: 0.4 mg/dL (ref 0.2–1.0)
BUN: 22 mg/dL — ABNORMAL HIGH (ref 7–18)
CALCIUM: 8.6 mg/dL (ref 8.5–10.1)
Chloride: 100 mmol/L (ref 98–107)
Co2: 27 mmol/L (ref 21–32)
Creatinine: 1.35 mg/dL — ABNORMAL HIGH (ref 0.60–1.30)
GFR CALC AF AMER: 42 — AB
GFR CALC NON AF AMER: 36 — AB
GLUCOSE: 144 mg/dL — AB (ref 65–99)
Osmolality: 276 (ref 275–301)
Potassium: 3.9 mmol/L (ref 3.5–5.1)
SGOT(AST): 17 U/L (ref 15–37)
SGPT (ALT): 15 U/L (ref 12–78)
SODIUM: 135 mmol/L — AB (ref 136–145)
Total Protein: 7.3 g/dL (ref 6.4–8.2)

## 2013-11-20 LAB — URINALYSIS, COMPLETE
BLOOD: NEGATIVE
Bacteria: NEGATIVE
Bilirubin,UR: NEGATIVE
GLUCOSE, UR: NEGATIVE mg/dL (ref 0–75)
KETONE: NEGATIVE
LEUKOCYTE ESTERASE: NEGATIVE
NITRITE: NEGATIVE
PROTEIN: NEGATIVE
Ph: 5 (ref 4.5–8.0)
Specific Gravity: 1.005 (ref 1.003–1.030)

## 2013-11-20 LAB — LIPASE, BLOOD: LIPASE: 78 U/L (ref 73–393)

## 2014-02-13 ENCOUNTER — Ambulatory Visit: Payer: Self-pay | Admitting: Family Medicine

## 2014-02-15 ENCOUNTER — Ambulatory Visit: Payer: Self-pay | Admitting: Family Medicine

## 2014-02-15 HISTORY — PX: BREAST BIOPSY: SHX20

## 2014-02-20 LAB — PATHOLOGY REPORT

## 2014-02-26 ENCOUNTER — Ambulatory Visit: Payer: Self-pay | Admitting: Internal Medicine

## 2014-02-26 LAB — CBC CANCER CENTER
Basophil #: 0.1 x10 3/mm (ref 0.0–0.1)
Basophil %: 1 %
Eosinophil #: 0.3 x10 3/mm (ref 0.0–0.7)
Eosinophil %: 5.1 %
HCT: 32.9 % — AB (ref 35.0–47.0)
HGB: 10.8 g/dL — ABNORMAL LOW (ref 12.0–16.0)
LYMPHS ABS: 1.7 x10 3/mm (ref 1.0–3.6)
LYMPHS PCT: 25.8 %
MCH: 28.7 pg (ref 26.0–34.0)
MCHC: 32.7 g/dL (ref 32.0–36.0)
MCV: 88 fL (ref 80–100)
MONO ABS: 0.5 x10 3/mm (ref 0.2–0.9)
MONOS PCT: 7.2 %
Neutrophil #: 4 x10 3/mm (ref 1.4–6.5)
Neutrophil %: 60.9 %
PLATELETS: 272 x10 3/mm (ref 150–440)
RBC: 3.76 10*6/uL — AB (ref 3.80–5.20)
RDW: 16 % — AB (ref 11.5–14.5)
WBC: 6.6 x10 3/mm (ref 3.6–11.0)

## 2014-02-26 LAB — COMPREHENSIVE METABOLIC PANEL
Albumin: 3.2 g/dL — ABNORMAL LOW (ref 3.4–5.0)
Alkaline Phosphatase: 93 U/L
Anion Gap: 13 (ref 7–16)
BUN: 31 mg/dL — AB (ref 7–18)
Bilirubin,Total: 0.3 mg/dL (ref 0.2–1.0)
CALCIUM: 8.4 mg/dL — AB (ref 8.5–10.1)
CO2: 22 mmol/L (ref 21–32)
CREATININE: 1.82 mg/dL — AB (ref 0.60–1.30)
Chloride: 95 mmol/L — ABNORMAL LOW (ref 98–107)
EGFR (African American): 29 — ABNORMAL LOW
EGFR (Non-African Amer.): 25 — ABNORMAL LOW
GLUCOSE: 314 mg/dL — AB (ref 65–99)
Osmolality: 279 (ref 275–301)
Potassium: 4.8 mmol/L (ref 3.5–5.1)
SGOT(AST): 13 U/L — ABNORMAL LOW (ref 15–37)
SGPT (ALT): 17 U/L (ref 12–78)
Sodium: 130 mmol/L — ABNORMAL LOW (ref 136–145)
Total Protein: 7.3 g/dL (ref 6.4–8.2)

## 2014-02-28 ENCOUNTER — Ambulatory Visit (INDEPENDENT_AMBULATORY_CARE_PROVIDER_SITE_OTHER): Payer: Medicare Other | Admitting: General Surgery

## 2014-02-28 ENCOUNTER — Encounter: Payer: Self-pay | Admitting: General Surgery

## 2014-02-28 VITALS — BP 162/64 | HR 82 | Resp 16 | Ht 62.0 in | Wt 202.0 lb

## 2014-02-28 DIAGNOSIS — C50511 Malignant neoplasm of lower-outer quadrant of right female breast: Secondary | ICD-10-CM | POA: Insufficient documentation

## 2014-02-28 DIAGNOSIS — C50919 Malignant neoplasm of unspecified site of unspecified female breast: Secondary | ICD-10-CM

## 2014-02-28 DIAGNOSIS — C50911 Malignant neoplasm of unspecified site of right female breast: Secondary | ICD-10-CM

## 2014-02-28 NOTE — Progress Notes (Signed)
Patient ID: Kristen Barron, female   DOB: Nov 14, 1928, 80 y.o.   MRN: [id number]  Chief Complaint  Patient presents with  . Other    breast     HPI Kristen Barron is a 80 y.o. female who presents for a breast evaluation. The most recent mammogram  was done on 02-13-14. She states she could feel the lump in the right breast prior to her mammogram.  Breast biopsy was done 02-15-14 showing invasive mammary carcinoma ER/PR negative, HER 2 positive.. Patient does perform regular self breast checks and gets regular mammograms done.    HPI  Past Medical History  Diagnosis Date  . Hypertension   . CHF (congestive heart failure)   . Stroke     August 2014, but has had strokes prior as well  . Cancer     Reports lumpectomy for a cyst  . Renal disorder   . Diabetes   . Hemorrhoids   . Arthritis     Past Surgical History  Procedure Laterality Date  . Abdominal hysterectomy      Patient not clear as to why  . Back surgery    . Hand surgery Right     Carpel tunnel release in the 1970s  . Lumbar laminectomy/decompression microdiscectomy Bilateral 07/24/2013    Procedure: LUMBAR LAMINECTOMY/DECOMPRESSION MICRODISCECTOMY LUMBAR THREE-FOUR;  Surgeon: Charlie Pitter, MD;  Location: [facility];  Service: Neurosurgery;  Laterality: Bilateral;  . Breast biopsy Right February 15 2014    invasive mammary carcinoma/ER/PR negative, HER 2 positive    Family History  Problem Relation Age of Onset  . Cancer Sister 61    breast  . Cancer Brother     kidney  . Cancer Maternal Aunt     breast  . Cancer Other     maternal niece with breast cancer  . Cancer Sister     pancreatic    Social History History  Substance Use Topics  . Smoking status: Never Smoker   . Smokeless tobacco: Never Used  . Alcohol Use: No    No Known Allergies  Current Outpatient Prescriptions  Medication Sig Dispense Refill  . amLODipine (NORVASC) 5 MG tablet Take 5 mg by mouth daily.      Marland Kitchen aspirin EC 81 MG tablet Take 81  mg by mouth daily.      . Calcium-Magnesium-Vitamin D (CALCIUM 500 PO) Take 500 mg by mouth daily.      . clopidogrel (PLAVIX) 75 MG tablet Take 75 mg by mouth daily with breakfast.      . docusate sodium 100 MG CAPS Take 100 mg by mouth daily.  30 capsule  0  . furosemide (LASIX) 20 MG tablet Take 20 mg by mouth 2 (two) times daily.      Marland Kitchen gabapentin (NEURONTIN) 300 MG capsule Take 300 mg by mouth 2 (two) times daily.       Marland Kitchen glipiZIDE (GLUCOTROL XL) 10 MG 24 hr tablet Take 10 mg by mouth 2 (two) times daily.      . hydrALAZINE (APRESOLINE) 25 MG tablet Take 25 mg by mouth 3 (three) times daily.      Marland Kitchen HYDROcodone-acetaminophen (NORCO/VICODIN) 5-325 MG per tablet Take 1 tablet by mouth every 6 (six) hours as needed.       . metoprolol succinate (TOPROL-XL) 100 MG 24 hr tablet Take 100 mg by mouth daily. Take with or immediately following a meal.      . omega-3 acid ethyl esters (LOVAZA) 1  G capsule Take 1 g by mouth 2 (two) times daily.       Marland Kitchen omeprazole (PRILOSEC) 20 MG capsule Take 20 mg by mouth daily.       . ondansetron (ZOFRAN ODT) 4 MG disintegrating tablet Take 1 tablet (4 mg total) by mouth every 8 (eight) hours as needed for nausea or vomiting.  10 tablet  0  . oxybutynin (DITROPAN) 5 MG tablet Take 10 mg by mouth every evening. Takes 5mg  in the morning and 10mg  in the evening      . pantoprazole (PROTONIX) 40 MG tablet Take 1 tablet (40 mg total) by mouth daily.  30 tablet  3  . polyethylene glycol powder (GLYCOLAX/MIRALAX) powder Take by mouth as needed.       . pravastatin (PRAVACHOL) 80 MG tablet Take 80 mg by mouth every evening.       . quinapril (ACCUPRIL) 40 MG tablet Take 40 mg by mouth at bedtime.       No current facility-administered medications for this visit.    Review of Systems Review of Systems  Constitutional: Negative.   Respiratory: Negative.   Cardiovascular: Negative.     Blood pressure 162/64, pulse 82, resp. rate 16, height 5\' 2"  (1.575 m), weight 202  lb (91.627 kg).  Physical Exam Physical Exam  Constitutional: She is oriented to person, place, and time. She appears well-developed and well-nourished.  Eyes: Conjunctivae are normal. No scleral icterus.  Neck: Neck supple.  Cardiovascular: Normal rate, regular rhythm and normal heart sounds.   Pulmonary/Chest: Effort normal and breath sounds normal. Right breast exhibits mass. Right breast exhibits no inverted nipple, no nipple discharge, no skin change and no tenderness. Left breast exhibits inverted nipple. Left breast exhibits no mass, no nipple discharge, no skin change and no tenderness.  1.5 cm mass right breast 6 o'clock subareolar. Mild retraction left nipple which is chronic.  Abdominal: Soft. There is no hepatosplenomegaly. There is no tenderness.  Lymphadenopathy:    She has no cervical adenopathy.    She has no axillary adenopathy.  Neurological: She is alert and oriented to person, place, and time.  Skin: Skin is warm and dry.    Data Reviewed Mammogram and recent pathology reviewed.  Assessment    A. 1.5 cm mass right breast 6 o'clock subareolar. Mild retraction left nipple which is chronic.      Plan    Surgery options discussed and patient prefers mastectomy.    Patient's surgery has been scheduled for 03-06-14 at [facility]. It is okay for patient to continue 81 mg aspirin. This patient has already discontinued Plavix and will remain off medication until after breast surgery.   Karry Causer G 02/28/2014, 3:38 PM

## 2014-02-28 NOTE — Patient Instructions (Addendum)
Continue self breast exams. Call office for any new breast issues or concerns. Mastectomy Surgery discussed  Patient's surgery has been scheduled for 03-06-14 at [facility]. It is okay for patient to continue 81 mg aspirin. Patient has already discontinued Plavix and will remain off medication until after breast surgery.

## 2014-03-06 ENCOUNTER — Encounter: Payer: Self-pay | Admitting: General Surgery

## 2014-03-06 ENCOUNTER — Ambulatory Visit: Payer: Self-pay | Admitting: General Surgery

## 2014-03-06 DIAGNOSIS — C50919 Malignant neoplasm of unspecified site of unspecified female breast: Secondary | ICD-10-CM

## 2014-03-06 HISTORY — PX: BREAST SURGERY: SHX581

## 2014-03-06 HISTORY — PX: MASTECTOMY: SHX3

## 2014-03-06 HISTORY — DX: Malignant neoplasm of unspecified site of unspecified female breast: C50.919

## 2014-03-08 ENCOUNTER — Encounter: Payer: Self-pay | Admitting: General Surgery

## 2014-03-09 ENCOUNTER — Ambulatory Visit (INDEPENDENT_AMBULATORY_CARE_PROVIDER_SITE_OTHER): Payer: Self-pay | Admitting: General Surgery

## 2014-03-09 ENCOUNTER — Encounter: Payer: Self-pay | Admitting: General Surgery

## 2014-03-09 VITALS — BP 160/70 | HR 70 | Resp 14 | Ht 62.0 in | Wt 205.0 lb

## 2014-03-09 DIAGNOSIS — C50911 Malignant neoplasm of unspecified site of right female breast: Secondary | ICD-10-CM

## 2014-03-09 DIAGNOSIS — C50919 Malignant neoplasm of unspecified site of unspecified female breast: Secondary | ICD-10-CM

## 2014-03-09 LAB — PATHOLOGY REPORT

## 2014-03-09 NOTE — Progress Notes (Signed)
Patient ID: Kristen Barron, female   DOB: 1929-05-02, 80 y.o.   MRN: [id number]  Chief Complaint  Patient presents with  . Routine Post Op    right mastectomy    HPI Kristen Barron is a 80 y.o. female. here today for her post op right mastectomy done on 03/06/14. Drain has been functioning well. Drainage in excess of 57ml per day. Marland Kitchen HPI  Past Medical History  Diagnosis Date  . Hypertension   . CHF (congestive heart failure)   . Stroke     August 2014, but has had strokes prior as well  . Cancer     Reports lumpectomy for a cyst  . Renal disorder   . Diabetes   . Hemorrhoids   . Arthritis     Past Surgical History  Procedure Laterality Date  . Abdominal hysterectomy      Patient not clear as to why  . Back surgery    . Hand surgery Right     Carpel tunnel release in the 1970s  . Lumbar laminectomy/decompression microdiscectomy Bilateral 07/24/2013    Procedure: LUMBAR LAMINECTOMY/DECOMPRESSION MICRODISCECTOMY LUMBAR THREE-FOUR;  Surgeon: Charlie Pitter, MD;  Location: [place];  Service: Neurosurgery;  Laterality: Bilateral;  . Breast biopsy Right February 15 2014    invasive mammary carcinoma/ER/PR negative, HER 2 positive    Family History  Problem Relation Age of Onset  . Cancer Sister 72    breast  . Cancer Brother     kidney  . Cancer Maternal Aunt     breast  . Cancer Other     maternal niece with breast cancer  . Cancer Sister     pancreatic    Social History History  Substance Use Topics  . Smoking status: Never Smoker   . Smokeless tobacco: Never Used  . Alcohol Use: No    No Known Allergies  Current Outpatient Prescriptions  Medication Sig Dispense Refill  . amLODipine (NORVASC) 5 MG tablet Take 5 mg by mouth daily.      Marland Kitchen aspirin EC 81 MG tablet Take 81 mg by mouth daily.      . Calcium-Magnesium-Vitamin D (CALCIUM 500 PO) Take 500 mg by mouth daily.      . clopidogrel (PLAVIX) 75 MG tablet Take 75 mg by mouth daily with breakfast.      .  docusate sodium 100 MG CAPS Take 100 mg by mouth daily.  30 capsule  0  . furosemide (LASIX) 20 MG tablet Take 20 mg by mouth 2 (two) times daily.      Marland Kitchen gabapentin (NEURONTIN) 300 MG capsule Take 300 mg by mouth 2 (two) times daily.       Marland Kitchen glipiZIDE (GLUCOTROL XL) 10 MG 24 hr tablet Take 10 mg by mouth 2 (two) times daily.      . hydrALAZINE (APRESOLINE) 25 MG tablet Take 25 mg by mouth 3 (three) times daily.      Marland Kitchen HYDROcodone-acetaminophen (NORCO/VICODIN) 5-325 MG per tablet Take 1 tablet by mouth every 6 (six) hours as needed.       . metoprolol succinate (TOPROL-XL) 100 MG 24 hr tablet Take 100 mg by mouth daily. Take with or immediately following a meal.      . omega-3 acid ethyl esters (LOVAZA) 1 G capsule Take 1 g by mouth 2 (two) times daily.       Marland Kitchen omeprazole (PRILOSEC) 20 MG capsule Take 20 mg by mouth daily.       Marland Kitchen  ondansetron (ZOFRAN ODT) 4 MG disintegrating tablet Take 1 tablet (4 mg total) by mouth every 8 (eight) hours as needed for nausea or vomiting.  10 tablet  0  . oxybutynin (DITROPAN) 5 MG tablet Take 10 mg by mouth every evening. Takes 5mg  in the morning and 10mg  in the evening      . pantoprazole (PROTONIX) 40 MG tablet Take 1 tablet (40 mg total) by mouth daily.  30 tablet  3  . polyethylene glycol powder (GLYCOLAX/MIRALAX) powder Take by mouth as needed.       . pravastatin (PRAVACHOL) 80 MG tablet Take 80 mg by mouth every evening.       . quinapril (ACCUPRIL) 40 MG tablet Take 40 mg by mouth at bedtime.       No current facility-administered medications for this visit.    Review of Systems Review of Systems  Constitutional: Negative.   Respiratory: Negative.   Cardiovascular: Negative.     Blood pressure 160/70, pulse 70, resp. rate 14, height 5\' 2"  (1.575 m), weight 205 lb (92.987 kg).  Physical Exam Physical Exam Mastectomy incision is intact and clean. Drain is functioning well. Small string of clot removed from tubing. Data  Reviewed Path-pending  Assessment    CA right breast     Plan    Return in 1 week. Call if drainage decreases to less than 34ml per day.        Dorota Heinrichs G 03/09/2014, 12:07 PM

## 2014-03-09 NOTE — Patient Instructions (Signed)
Patient to return in one week. 

## 2014-03-10 ENCOUNTER — Ambulatory Visit: Payer: Self-pay | Admitting: Internal Medicine

## 2014-03-12 ENCOUNTER — Encounter: Payer: Self-pay | Admitting: General Surgery

## 2014-03-15 ENCOUNTER — Encounter: Payer: Self-pay | Admitting: General Surgery

## 2014-03-15 ENCOUNTER — Ambulatory Visit (INDEPENDENT_AMBULATORY_CARE_PROVIDER_SITE_OTHER): Payer: Self-pay | Admitting: General Surgery

## 2014-03-15 VITALS — BP 130/60 | HR 84 | Resp 16 | Ht 62.0 in | Wt 199.0 lb

## 2014-03-15 DIAGNOSIS — C50919 Malignant neoplasm of unspecified site of unspecified female breast: Secondary | ICD-10-CM

## 2014-03-15 DIAGNOSIS — C50911 Malignant neoplasm of unspecified site of right female breast: Secondary | ICD-10-CM

## 2014-03-15 NOTE — Patient Instructions (Signed)
Patient to return in 1 week for follow up. The patient is aware to call back for any questions or concerns.

## 2014-03-15 NOTE — Progress Notes (Signed)
Patient ID: Kristen Barron, female   DOB: 12/11/1928, 80 y.o.   MRN: [id number]  The patient presents for a post op right mastectomy and port placement. The procedure was performed on 03/06/14. The patient denies any new problems at this time. Overall doing well.   Right mastectomy site healing well. Clean no signs of reoccurrence. The drainage is in excess of 60 ml daily. Drainage is serosanguinous. Patient to return in 1 week for follow up.   Pathology- sentinel node was negative. Tumor size 1.6 cm.

## 2014-03-20 LAB — COMPREHENSIVE METABOLIC PANEL
ALBUMIN: 2.9 g/dL — AB (ref 3.4–5.0)
ALK PHOS: 89 U/L
Anion Gap: 9 (ref 7–16)
BUN: 15 mg/dL (ref 7–18)
Bilirubin,Total: 0.4 mg/dL (ref 0.2–1.0)
CALCIUM: 8 mg/dL — AB (ref 8.5–10.1)
Chloride: 98 mmol/L (ref 98–107)
Co2: 26 mmol/L (ref 21–32)
Creatinine: 1.48 mg/dL — ABNORMAL HIGH (ref 0.60–1.30)
EGFR (Non-African Amer.): 33 — ABNORMAL LOW
GFR CALC AF AMER: 38 — AB
Glucose: 69 mg/dL (ref 65–99)
Osmolality: 266 (ref 275–301)
Potassium: 3.8 mmol/L (ref 3.5–5.1)
SGOT(AST): 11 U/L — ABNORMAL LOW (ref 15–37)
SGPT (ALT): 11 U/L — ABNORMAL LOW
Sodium: 133 mmol/L — ABNORMAL LOW (ref 136–145)
Total Protein: 7.1 g/dL (ref 6.4–8.2)

## 2014-03-20 LAB — CBC WITH DIFFERENTIAL/PLATELET
BASOS ABS: 0.1 10*3/uL (ref 0.0–0.1)
BASOS PCT: 0.4 %
Eosinophil #: 0 10*3/uL (ref 0.0–0.7)
Eosinophil %: 0 %
HCT: 29.7 % — ABNORMAL LOW (ref 35.0–47.0)
HGB: 9.6 g/dL — AB (ref 12.0–16.0)
LYMPHS PCT: 7.8 %
Lymphocyte #: 1.3 10*3/uL (ref 1.0–3.6)
MCH: 28.6 pg (ref 26.0–34.0)
MCHC: 32.3 g/dL (ref 32.0–36.0)
MCV: 89 fL (ref 80–100)
Monocyte #: 1 x10 3/mm — ABNORMAL HIGH (ref 0.2–0.9)
Monocyte %: 5.7 %
NEUTROS PCT: 86.1 %
Neutrophil #: 14.6 10*3/uL — ABNORMAL HIGH (ref 1.4–6.5)
Platelet: 313 10*3/uL (ref 150–440)
RBC: 3.35 10*6/uL — ABNORMAL LOW (ref 3.80–5.20)
RDW: 14.8 % — ABNORMAL HIGH (ref 11.5–14.5)
WBC: 17 10*3/uL — AB (ref 3.6–11.0)

## 2014-03-21 ENCOUNTER — Observation Stay: Payer: Self-pay | Admitting: Internal Medicine

## 2014-03-21 LAB — URINALYSIS, COMPLETE
BACTERIA: NONE SEEN
Bilirubin,UR: NEGATIVE
Blood: NEGATIVE
GLUCOSE, UR: NEGATIVE mg/dL (ref 0–75)
Ketone: NEGATIVE
LEUKOCYTE ESTERASE: NEGATIVE
Nitrite: NEGATIVE
PH: 6 (ref 4.5–8.0)
Protein: NEGATIVE
RBC,UR: 1 /HPF (ref 0–5)
Specific Gravity: 1.008 (ref 1.003–1.030)
Squamous Epithelial: 1

## 2014-03-22 ENCOUNTER — Ambulatory Visit: Payer: Medicare Other | Admitting: General Surgery

## 2014-03-25 LAB — CULTURE, BLOOD (SINGLE)

## 2014-03-26 ENCOUNTER — Encounter: Payer: Self-pay | Admitting: General Surgery

## 2014-03-26 ENCOUNTER — Ambulatory Visit (INDEPENDENT_AMBULATORY_CARE_PROVIDER_SITE_OTHER): Payer: Self-pay | Admitting: General Surgery

## 2014-03-26 DIAGNOSIS — C50911 Malignant neoplasm of unspecified site of right female breast: Secondary | ICD-10-CM

## 2014-03-26 DIAGNOSIS — C50919 Malignant neoplasm of unspecified site of unspecified female breast: Secondary | ICD-10-CM

## 2014-03-26 NOTE — Patient Instructions (Signed)
Follow up appointment to be announced.  

## 2014-03-26 NOTE — Progress Notes (Signed)
Patient came in today for a drain check. Replaced the bulb . Patient to return when drainage is less than 30 ml for three days.  Pt was placed in observation last week when she had some chills and fever. Resolved quickly. No apparent signs of infection in mastectomy site. Drainage mostly serous.

## 2014-03-28 ENCOUNTER — Ambulatory Visit: Payer: Medicare Other | Admitting: General Surgery

## 2014-03-28 ENCOUNTER — Ambulatory Visit (INDEPENDENT_AMBULATORY_CARE_PROVIDER_SITE_OTHER): Payer: Self-pay | Admitting: *Deleted

## 2014-03-28 DIAGNOSIS — C50919 Malignant neoplasm of unspecified site of unspecified female breast: Secondary | ICD-10-CM

## 2014-03-28 DIAGNOSIS — C50911 Malignant neoplasm of unspecified site of right female breast: Secondary | ICD-10-CM

## 2014-03-28 NOTE — Progress Notes (Signed)
Patient came in today to see if we could remove her drain. The drainage was still to hight to remove drain.

## 2014-04-03 ENCOUNTER — Ambulatory Visit (INDEPENDENT_AMBULATORY_CARE_PROVIDER_SITE_OTHER): Payer: Self-pay | Admitting: *Deleted

## 2014-04-03 ENCOUNTER — Telehealth: Payer: Self-pay | Admitting: *Deleted

## 2014-04-03 DIAGNOSIS — C50919 Malignant neoplasm of unspecified site of unspecified female breast: Secondary | ICD-10-CM

## 2014-04-03 DIAGNOSIS — C50911 Malignant neoplasm of unspecified site of right female breast: Secondary | ICD-10-CM

## 2014-04-03 MED ORDER — DOXYCYCLINE HYCLATE 100 MG PO CAPS: 100.0000 mg | ORAL_CAPSULE | Freq: Every day | ORAL | Status: DC

## 2014-04-03 NOTE — Telephone Encounter (Signed)
Patient aware of new RX.  

## 2014-04-03 NOTE — Progress Notes (Signed)
Patient came in today for a wound/drain check.  Drainage has been less than 30 ml for the past 6 days.The wound is clean, with some redness noted above the incision. The patient states she just noticed the redness this morning. Derma bond remains intact to incision. Temp check 98.0. Denies any fever or chills. Follow up as scheduled. She states she has to have chemotherapy and appointment to see Dr Oliva Bustard Monday at 8:00.

## 2014-04-03 NOTE — Telephone Encounter (Signed)
Pt is worried about drain and wants to talk to you, may also want to come in and let you look at it.

## 2014-04-03 NOTE — Telephone Encounter (Signed)
She states the drainage is less, appt for 1:00 today, pt agrees.

## 2014-04-03 NOTE — Patient Instructions (Signed)
Follow up as scheduled.  

## 2014-04-03 NOTE — Telephone Encounter (Signed)
Message copied by Carson Myrtle on Tue Apr 03, 2014  2:54 PM ------      Message from: Christene Lye      Created: Tue Apr 03, 2014  2:31 PM       Send Rx doxycycline 100mg  daily for 7 days      ----- Message -----         From: Carson Myrtle, RN         Sent: 04/03/2014   1:16 PM           To: Christene Lye, MD                   ------

## 2014-04-09 LAB — COMPREHENSIVE METABOLIC PANEL
ALBUMIN: 3.2 g/dL — AB (ref 3.4–5.0)
Alkaline Phosphatase: 98 U/L
Anion Gap: 11 (ref 7–16)
BUN: 24 mg/dL — ABNORMAL HIGH (ref 7–18)
Bilirubin,Total: 0.3 mg/dL (ref 0.2–1.0)
CREATININE: 1.45 mg/dL — AB (ref 0.60–1.30)
Calcium, Total: 8.6 mg/dL (ref 8.5–10.1)
Chloride: 100 mmol/L (ref 98–107)
Co2: 26 mmol/L (ref 21–32)
EGFR (African American): 39 — ABNORMAL LOW
EGFR (Non-African Amer.): 33 — ABNORMAL LOW
Glucose: 151 mg/dL — ABNORMAL HIGH (ref 65–99)
OSMOLALITY: 281 (ref 275–301)
Potassium: 3.8 mmol/L (ref 3.5–5.1)
SGOT(AST): 14 U/L — ABNORMAL LOW (ref 15–37)
SGPT (ALT): 15 U/L
Sodium: 137 mmol/L (ref 136–145)
Total Protein: 7.4 g/dL (ref 6.4–8.2)

## 2014-04-09 LAB — CBC CANCER CENTER
BASOS ABS: 0.1 x10 3/mm (ref 0.0–0.1)
BASOS PCT: 1.3 %
EOS PCT: 5.5 %
Eosinophil #: 0.4 x10 3/mm (ref 0.0–0.7)
HCT: 31.9 % — AB (ref 35.0–47.0)
HGB: 10.5 g/dL — AB (ref 12.0–16.0)
LYMPHS ABS: 1.4 x10 3/mm (ref 1.0–3.6)
LYMPHS PCT: 19.6 %
MCH: 28.9 pg (ref 26.0–34.0)
MCHC: 32.9 g/dL (ref 32.0–36.0)
MCV: 88 fL (ref 80–100)
MONO ABS: 0.6 x10 3/mm (ref 0.2–0.9)
Monocyte %: 7.7 %
NEUTROS PCT: 65.9 %
Neutrophil #: 4.8 x10 3/mm (ref 1.4–6.5)
Platelet: 359 x10 3/mm (ref 150–440)
RBC: 3.63 10*6/uL — ABNORMAL LOW (ref 3.80–5.20)
RDW: 14.3 % (ref 11.5–14.5)
WBC: 7.2 x10 3/mm (ref 3.6–11.0)

## 2014-04-10 ENCOUNTER — Ambulatory Visit: Payer: Self-pay | Admitting: Internal Medicine

## 2014-04-17 ENCOUNTER — Ambulatory Visit: Payer: Self-pay | Admitting: Internal Medicine

## 2014-04-17 LAB — COMPREHENSIVE METABOLIC PANEL
ALT: 22 U/L
ANION GAP: 8 (ref 7–16)
AST: 18 U/L (ref 15–37)
Albumin: 3.2 g/dL — ABNORMAL LOW (ref 3.4–5.0)
Alkaline Phosphatase: 87 U/L
BILIRUBIN TOTAL: 0.4 mg/dL (ref 0.2–1.0)
BUN: 23 mg/dL — ABNORMAL HIGH (ref 7–18)
CREATININE: 1.57 mg/dL — AB (ref 0.60–1.30)
Calcium, Total: 8.6 mg/dL (ref 8.5–10.1)
Chloride: 100 mmol/L (ref 98–107)
Co2: 27 mmol/L (ref 21–32)
EGFR (African American): 35 — ABNORMAL LOW
GFR CALC NON AF AMER: 30 — AB
Glucose: 169 mg/dL — ABNORMAL HIGH (ref 65–99)
Osmolality: 278 (ref 275–301)
Potassium: 4.5 mmol/L (ref 3.5–5.1)
Sodium: 135 mmol/L — ABNORMAL LOW (ref 136–145)
Total Protein: 7.2 g/dL (ref 6.4–8.2)

## 2014-04-17 LAB — HEMOGLOBIN A1C: HEMOGLOBIN A1C: 8 % — AB (ref 4.2–6.3)

## 2014-04-17 LAB — CBC CANCER CENTER
BASOS ABS: 0.1 x10 3/mm (ref 0.0–0.1)
Basophil %: 1.7 %
Eosinophil #: 0.3 x10 3/mm (ref 0.0–0.7)
Eosinophil %: 6.2 %
HCT: 30.6 % — AB (ref 35.0–47.0)
HGB: 10.1 g/dL — ABNORMAL LOW (ref 12.0–16.0)
Lymphocyte #: 1.4 x10 3/mm (ref 1.0–3.6)
Lymphocyte %: 30.9 %
MCH: 29.2 pg (ref 26.0–34.0)
MCHC: 33.1 g/dL (ref 32.0–36.0)
MCV: 88 fL (ref 80–100)
MONO ABS: 0.2 x10 3/mm (ref 0.2–0.9)
Monocyte %: 4.7 %
NEUTROS ABS: 2.6 x10 3/mm (ref 1.4–6.5)
Neutrophil %: 56.5 %
PLATELETS: 305 x10 3/mm (ref 150–440)
RBC: 3.47 10*6/uL — ABNORMAL LOW (ref 3.80–5.20)
RDW: 14.3 % (ref 11.5–14.5)
WBC: 4.6 x10 3/mm (ref 3.6–11.0)

## 2014-04-24 LAB — CBC CANCER CENTER
BASOS ABS: 0.1 x10 3/mm (ref 0.0–0.1)
BASOS PCT: 2.2 %
EOS ABS: 0.2 x10 3/mm (ref 0.0–0.7)
EOS PCT: 6.9 %
HCT: 29.7 % — AB (ref 35.0–47.0)
HGB: 9.7 g/dL — ABNORMAL LOW (ref 12.0–16.0)
LYMPHS PCT: 39.5 %
Lymphocyte #: 1.1 x10 3/mm (ref 1.0–3.6)
MCH: 29 pg (ref 26.0–34.0)
MCHC: 32.8 g/dL (ref 32.0–36.0)
MCV: 89 fL (ref 80–100)
Monocyte #: 0.1 x10 3/mm — ABNORMAL LOW (ref 0.2–0.9)
Monocyte %: 3.6 %
Neutrophil #: 1.4 x10 3/mm (ref 1.4–6.5)
Neutrophil %: 47.8 %
PLATELETS: 277 x10 3/mm (ref 150–440)
RBC: 3.36 10*6/uL — AB (ref 3.80–5.20)
RDW: 14.7 % — ABNORMAL HIGH (ref 11.5–14.5)
WBC: 2.9 x10 3/mm — AB (ref 3.6–11.0)

## 2014-04-24 LAB — BASIC METABOLIC PANEL
Anion Gap: 8 (ref 7–16)
BUN: 24 mg/dL — ABNORMAL HIGH (ref 7–18)
CALCIUM: 8.6 mg/dL (ref 8.5–10.1)
CHLORIDE: 100 mmol/L (ref 98–107)
CREATININE: 1.49 mg/dL — AB (ref 0.60–1.30)
Co2: 26 mmol/L (ref 21–32)
EGFR (African American): 38 — ABNORMAL LOW
EGFR (Non-African Amer.): 32 — ABNORMAL LOW
Glucose: 176 mg/dL — ABNORMAL HIGH (ref 65–99)
OSMOLALITY: 277 (ref 275–301)
POTASSIUM: 4.1 mmol/L (ref 3.5–5.1)
Sodium: 134 mmol/L — ABNORMAL LOW (ref 136–145)

## 2014-05-01 LAB — COMPREHENSIVE METABOLIC PANEL
ALT: 26 U/L
AST: 19 U/L (ref 15–37)
Albumin: 3.2 g/dL — ABNORMAL LOW (ref 3.4–5.0)
Alkaline Phosphatase: 88 U/L
Anion Gap: 8 (ref 7–16)
BILIRUBIN TOTAL: 0.4 mg/dL (ref 0.2–1.0)
BUN: 18 mg/dL (ref 7–18)
CHLORIDE: 97 mmol/L — AB (ref 98–107)
Calcium, Total: 8.6 mg/dL (ref 8.5–10.1)
Co2: 27 mmol/L (ref 21–32)
Creatinine: 1.66 mg/dL — ABNORMAL HIGH (ref 0.60–1.30)
GFR CALC AF AMER: 33 — AB
GFR CALC NON AF AMER: 28 — AB
GLUCOSE: 191 mg/dL — AB (ref 65–99)
Osmolality: 272 (ref 275–301)
POTASSIUM: 3.9 mmol/L (ref 3.5–5.1)
Sodium: 132 mmol/L — ABNORMAL LOW (ref 136–145)
Total Protein: 7 g/dL (ref 6.4–8.2)

## 2014-05-01 LAB — CBC CANCER CENTER
BASOS ABS: 0.1 x10 3/mm (ref 0.0–0.1)
BASOS PCT: 2.3 %
Eosinophil #: 0.1 x10 3/mm (ref 0.0–0.7)
Eosinophil %: 4 %
HCT: 30.8 % — ABNORMAL LOW (ref 35.0–47.0)
HGB: 10.1 g/dL — AB (ref 12.0–16.0)
LYMPHS ABS: 1.4 x10 3/mm (ref 1.0–3.6)
LYMPHS PCT: 37.6 %
MCH: 29.1 pg (ref 26.0–34.0)
MCHC: 32.6 g/dL (ref 32.0–36.0)
MCV: 89 fL (ref 80–100)
MONO ABS: 0.2 x10 3/mm (ref 0.2–0.9)
Monocyte %: 6.6 %
Neutrophil #: 1.8 x10 3/mm (ref 1.4–6.5)
Neutrophil %: 49.5 %
Platelet: 340 x10 3/mm (ref 150–440)
RBC: 3.45 10*6/uL — ABNORMAL LOW (ref 3.80–5.20)
RDW: 15.2 % — ABNORMAL HIGH (ref 11.5–14.5)
WBC: 3.7 x10 3/mm (ref 3.6–11.0)

## 2014-05-08 LAB — COMPREHENSIVE METABOLIC PANEL
ALK PHOS: 84 U/L
AST: 16 U/L (ref 15–37)
Albumin: 3.1 g/dL — ABNORMAL LOW (ref 3.4–5.0)
Anion Gap: 11 (ref 7–16)
BILIRUBIN TOTAL: 0.4 mg/dL (ref 0.2–1.0)
BUN: 26 mg/dL — AB (ref 7–18)
CALCIUM: 8.5 mg/dL (ref 8.5–10.1)
CREATININE: 1.68 mg/dL — AB (ref 0.60–1.30)
Chloride: 100 mmol/L (ref 98–107)
Co2: 23 mmol/L (ref 21–32)
EGFR (African American): 38 — ABNORMAL LOW
GFR CALC NON AF AMER: 31 — AB
Glucose: 302 mg/dL — ABNORMAL HIGH (ref 65–99)
Osmolality: 284 (ref 275–301)
Potassium: 4.2 mmol/L (ref 3.5–5.1)
SGPT (ALT): 25 U/L
SODIUM: 134 mmol/L — AB (ref 136–145)
Total Protein: 6.5 g/dL (ref 6.4–8.2)

## 2014-05-08 LAB — CBC CANCER CENTER
BASOS ABS: 0.1 x10 3/mm (ref 0.0–0.1)
Basophil %: 2.4 %
EOS PCT: 4.3 %
Eosinophil #: 0.2 x10 3/mm (ref 0.0–0.7)
HCT: 28.5 % — AB (ref 35.0–47.0)
HGB: 9.4 g/dL — ABNORMAL LOW (ref 12.0–16.0)
LYMPHS ABS: 1.4 x10 3/mm (ref 1.0–3.6)
Lymphocyte %: 37.4 %
MCH: 29.5 pg (ref 26.0–34.0)
MCHC: 33.1 g/dL (ref 32.0–36.0)
MCV: 89 fL (ref 80–100)
Monocyte #: 0.2 x10 3/mm (ref 0.2–0.9)
Monocyte %: 5 %
NEUTROS PCT: 50.9 %
Neutrophil #: 1.9 x10 3/mm (ref 1.4–6.5)
Platelet: 304 x10 3/mm (ref 150–440)
RBC: 3.19 10*6/uL — AB (ref 3.80–5.20)
RDW: 16 % — ABNORMAL HIGH (ref 11.5–14.5)
WBC: 3.7 x10 3/mm (ref 3.6–11.0)

## 2014-05-10 ENCOUNTER — Ambulatory Visit: Payer: Self-pay | Admitting: Internal Medicine

## 2014-05-13 ENCOUNTER — Observation Stay: Payer: Self-pay | Admitting: Internal Medicine

## 2014-05-13 LAB — BASIC METABOLIC PANEL
Anion Gap: 10 (ref 7–16)
BUN: 20 mg/dL — ABNORMAL HIGH (ref 7–18)
CHLORIDE: 103 mmol/L (ref 98–107)
Calcium, Total: 7.8 mg/dL — ABNORMAL LOW (ref 8.5–10.1)
Co2: 21 mmol/L (ref 21–32)
Creatinine: 1.48 mg/dL — ABNORMAL HIGH (ref 0.60–1.30)
EGFR (African American): 43 — ABNORMAL LOW
GFR CALC NON AF AMER: 36 — AB
Glucose: 183 mg/dL — ABNORMAL HIGH (ref 65–99)
OSMOLALITY: 276 (ref 275–301)
POTASSIUM: 3.9 mmol/L (ref 3.5–5.1)
SODIUM: 134 mmol/L — AB (ref 136–145)

## 2014-05-13 LAB — PROTIME-INR
INR: 1
Prothrombin Time: 13.5 secs (ref 11.5–14.7)

## 2014-05-13 LAB — CBC WITH DIFFERENTIAL/PLATELET
BASOS ABS: 0 10*3/uL (ref 0.0–0.1)
BASOS PCT: 0.4 %
EOS ABS: 0 10*3/uL (ref 0.0–0.7)
Eosinophil %: 0 %
HCT: 28.8 % — AB (ref 35.0–47.0)
HGB: 9.7 g/dL — ABNORMAL LOW (ref 12.0–16.0)
Lymphocyte #: 0.4 10*3/uL — ABNORMAL LOW (ref 1.0–3.6)
Lymphocyte %: 12.2 %
MCH: 30.1 pg (ref 26.0–34.0)
MCHC: 33.6 g/dL (ref 32.0–36.0)
MCV: 90 fL (ref 80–100)
Monocyte #: 0.1 x10 3/mm — ABNORMAL LOW (ref 0.2–0.9)
Monocyte %: 3.9 %
NEUTROS ABS: 2.7 10*3/uL (ref 1.4–6.5)
NEUTROS PCT: 83.5 %
PLATELETS: 260 10*3/uL (ref 150–440)
RBC: 3.22 10*6/uL — AB (ref 3.80–5.20)
RDW: 16.6 % — ABNORMAL HIGH (ref 11.5–14.5)
WBC: 3.3 10*3/uL — ABNORMAL LOW (ref 3.6–11.0)

## 2014-05-13 LAB — URINALYSIS, COMPLETE
Bilirubin,UR: NEGATIVE
GLUCOSE, UR: NEGATIVE mg/dL (ref 0–75)
KETONE: NEGATIVE
Nitrite: POSITIVE
Ph: 5 (ref 4.5–8.0)
Specific Gravity: 1.015 (ref 1.003–1.030)
Transitional Epi: 4

## 2014-05-13 LAB — TROPONIN I

## 2014-05-14 LAB — CBC WITH DIFFERENTIAL/PLATELET
BASOS PCT: 1 %
Basophil #: 0 10*3/uL (ref 0.0–0.1)
EOS PCT: 1.2 %
Eosinophil #: 0.1 10*3/uL (ref 0.0–0.7)
HCT: 24.6 % — AB (ref 35.0–47.0)
HGB: 8 g/dL — ABNORMAL LOW (ref 12.0–16.0)
LYMPHS PCT: 19.8 %
Lymphocyte #: 0.9 10*3/uL — ABNORMAL LOW (ref 1.0–3.6)
MCH: 29.4 pg (ref 26.0–34.0)
MCHC: 32.4 g/dL (ref 32.0–36.0)
MCV: 91 fL (ref 80–100)
Monocyte #: 0.2 x10 3/mm (ref 0.2–0.9)
Monocyte %: 4.5 %
NEUTROS PCT: 73.5 %
Neutrophil #: 3.2 10*3/uL (ref 1.4–6.5)
Platelet: 206 10*3/uL (ref 150–440)
RBC: 2.71 10*6/uL — ABNORMAL LOW (ref 3.80–5.20)
RDW: 17.4 % — ABNORMAL HIGH (ref 11.5–14.5)
WBC: 4.4 10*3/uL (ref 3.6–11.0)

## 2014-05-14 LAB — BASIC METABOLIC PANEL
Anion Gap: 7 (ref 7–16)
BUN: 20 mg/dL — ABNORMAL HIGH (ref 7–18)
Calcium, Total: 7.5 mg/dL — ABNORMAL LOW (ref 8.5–10.1)
Chloride: 109 mmol/L — ABNORMAL HIGH (ref 98–107)
Co2: 23 mmol/L (ref 21–32)
Creatinine: 1.41 mg/dL — ABNORMAL HIGH (ref 0.60–1.30)
EGFR (Non-African Amer.): 38 — ABNORMAL LOW
GFR CALC AF AMER: 46 — AB
Glucose: 73 mg/dL (ref 65–99)
Osmolality: 279 (ref 275–301)
POTASSIUM: 3.7 mmol/L (ref 3.5–5.1)
Sodium: 139 mmol/L (ref 136–145)

## 2014-05-15 LAB — IRON AND TIBC
IRON SATURATION: 7 %
Iron Bind.Cap.(Total): 206 ug/dL — ABNORMAL LOW (ref 250–450)
Iron: 15 ug/dL — ABNORMAL LOW (ref 50–170)
UNBOUND IRON-BIND. CAP.: 191 ug/dL

## 2014-05-15 LAB — BASIC METABOLIC PANEL
Anion Gap: 6 — ABNORMAL LOW (ref 7–16)
BUN: 16 mg/dL (ref 7–18)
CALCIUM: 7.1 mg/dL — AB (ref 8.5–10.1)
CO2: 21 mmol/L (ref 21–32)
Chloride: 113 mmol/L — ABNORMAL HIGH (ref 98–107)
Creatinine: 1.37 mg/dL — ABNORMAL HIGH (ref 0.60–1.30)
EGFR (Non-African Amer.): 39 — ABNORMAL LOW
GFR CALC AF AMER: 48 — AB
Glucose: 87 mg/dL (ref 65–99)
OSMOLALITY: 280 (ref 275–301)
POTASSIUM: 4.1 mmol/L (ref 3.5–5.1)
Sodium: 140 mmol/L (ref 136–145)

## 2014-05-15 LAB — FERRITIN: Ferritin (ARMC): 96 ng/mL (ref 8–388)

## 2014-05-15 LAB — OCCULT BLOOD X 1 CARD TO LAB, STOOL: Occult Blood, Feces: NEGATIVE

## 2014-05-15 LAB — HEMOGLOBIN: HGB: 7.3 g/dL — AB (ref 12.0–16.0)

## 2014-05-15 LAB — URINE CULTURE

## 2014-05-18 LAB — CULTURE, BLOOD (SINGLE)

## 2014-05-23 LAB — COMPREHENSIVE METABOLIC PANEL
ANION GAP: 9 (ref 7–16)
AST: 16 U/L (ref 15–37)
Albumin: 3 g/dL — ABNORMAL LOW (ref 3.4–5.0)
Alkaline Phosphatase: 96 U/L
BILIRUBIN TOTAL: 0.3 mg/dL (ref 0.2–1.0)
BUN: 18 mg/dL (ref 7–18)
CHLORIDE: 101 mmol/L (ref 98–107)
Calcium, Total: 8.9 mg/dL (ref 8.5–10.1)
Co2: 27 mmol/L (ref 21–32)
Creatinine: 1.57 mg/dL — ABNORMAL HIGH (ref 0.60–1.30)
EGFR (African American): 41 — ABNORMAL LOW
EGFR (Non-African Amer.): 34 — ABNORMAL LOW
GLUCOSE: 198 mg/dL — AB (ref 65–99)
Osmolality: 281 (ref 275–301)
Potassium: 4.4 mmol/L (ref 3.5–5.1)
SGPT (ALT): 22 U/L
Sodium: 137 mmol/L (ref 136–145)
Total Protein: 7.1 g/dL (ref 6.4–8.2)

## 2014-05-23 LAB — CBC CANCER CENTER
BASOS PCT: 2.2 %
Basophil #: 0.2 x10 3/mm — ABNORMAL HIGH (ref 0.0–0.1)
Eosinophil #: 0.2 x10 3/mm (ref 0.0–0.7)
Eosinophil %: 2.1 %
HCT: 32.3 % — AB (ref 35.0–47.0)
HGB: 10.5 g/dL — ABNORMAL LOW (ref 12.0–16.0)
LYMPHS ABS: 2 x10 3/mm (ref 1.0–3.6)
Lymphocyte %: 24 %
MCH: 29.8 pg (ref 26.0–34.0)
MCHC: 32.6 g/dL (ref 32.0–36.0)
MCV: 91 fL (ref 80–100)
Monocyte #: 0.7 x10 3/mm (ref 0.2–0.9)
Monocyte %: 8.5 %
Neutrophil #: 5.3 x10 3/mm (ref 1.4–6.5)
Neutrophil %: 63.2 %
PLATELETS: 372 x10 3/mm (ref 150–440)
RBC: 3.53 10*6/uL — ABNORMAL LOW (ref 3.80–5.20)
RDW: 17.4 % — ABNORMAL HIGH (ref 11.5–14.5)
WBC: 8.4 x10 3/mm (ref 3.6–11.0)

## 2014-06-10 ENCOUNTER — Ambulatory Visit: Payer: Self-pay | Admitting: Internal Medicine

## 2014-06-11 ENCOUNTER — Encounter: Payer: Self-pay | Admitting: General Surgery

## 2014-06-13 LAB — COMPREHENSIVE METABOLIC PANEL
ALT: 18 U/L
Albumin: 3.1 g/dL — ABNORMAL LOW (ref 3.4–5.0)
Alkaline Phosphatase: 103 U/L
Anion Gap: 7 (ref 7–16)
BUN: 20 mg/dL — AB (ref 7–18)
Bilirubin,Total: 0.4 mg/dL (ref 0.2–1.0)
CALCIUM: 8.2 mg/dL — AB (ref 8.5–10.1)
CO2: 28 mmol/L (ref 21–32)
CREATININE: 1.38 mg/dL — AB (ref 0.60–1.30)
Chloride: 97 mmol/L — ABNORMAL LOW (ref 98–107)
EGFR (African American): 47 — ABNORMAL LOW
EGFR (Non-African Amer.): 39 — ABNORMAL LOW
Glucose: 286 mg/dL — ABNORMAL HIGH (ref 65–99)
OSMOLALITY: 278 (ref 275–301)
POTASSIUM: 4.3 mmol/L (ref 3.5–5.1)
SGOT(AST): 18 U/L (ref 15–37)
SODIUM: 132 mmol/L — AB (ref 136–145)
Total Protein: 7 g/dL (ref 6.4–8.2)

## 2014-06-13 LAB — CBC CANCER CENTER
Basophil #: 0.1 x10 3/mm (ref 0.0–0.1)
Basophil %: 1.2 %
EOS PCT: 6.6 %
Eosinophil #: 0.5 x10 3/mm (ref 0.0–0.7)
HCT: 32.7 % — ABNORMAL LOW (ref 35.0–47.0)
HGB: 10.8 g/dL — ABNORMAL LOW (ref 12.0–16.0)
Lymphocyte #: 1.6 x10 3/mm (ref 1.0–3.6)
Lymphocyte %: 20 %
MCH: 30.2 pg (ref 26.0–34.0)
MCHC: 32.9 g/dL (ref 32.0–36.0)
MCV: 92 fL (ref 80–100)
MONO ABS: 0.5 x10 3/mm (ref 0.2–0.9)
Monocyte %: 6 %
NEUTROS PCT: 66.2 %
Neutrophil #: 5.2 x10 3/mm (ref 1.4–6.5)
Platelet: 231 x10 3/mm (ref 150–440)
RBC: 3.57 10*6/uL — AB (ref 3.80–5.20)
RDW: 16.8 % — ABNORMAL HIGH (ref 11.5–14.5)
WBC: 7.9 x10 3/mm (ref 3.6–11.0)

## 2014-07-09 LAB — COMPREHENSIVE METABOLIC PANEL
ALK PHOS: 106 U/L
AST: 15 U/L (ref 15–37)
Albumin: 3.1 g/dL — ABNORMAL LOW (ref 3.4–5.0)
Anion Gap: 10 (ref 7–16)
BILIRUBIN TOTAL: 0.4 mg/dL (ref 0.2–1.0)
BUN: 16 mg/dL (ref 7–18)
CO2: 29 mmol/L (ref 21–32)
Calcium, Total: 8.9 mg/dL (ref 8.5–10.1)
Chloride: 96 mmol/L — ABNORMAL LOW (ref 98–107)
Creatinine: 1.48 mg/dL — ABNORMAL HIGH (ref 0.60–1.30)
EGFR (African American): 43 — ABNORMAL LOW
EGFR (Non-African Amer.): 36 — ABNORMAL LOW
GLUCOSE: 252 mg/dL — AB (ref 65–99)
OSMOLALITY: 280 (ref 275–301)
Potassium: 3.9 mmol/L (ref 3.5–5.1)
SGPT (ALT): 13 U/L — ABNORMAL LOW
Sodium: 135 mmol/L — ABNORMAL LOW (ref 136–145)
Total Protein: 7.3 g/dL (ref 6.4–8.2)

## 2014-07-09 LAB — CBC CANCER CENTER
BASOS PCT: 1 %
Basophil #: 0.1 x10 3/mm (ref 0.0–0.1)
Eosinophil #: 0.7 x10 3/mm (ref 0.0–0.7)
Eosinophil %: 8 %
HCT: 31.3 % — AB (ref 35.0–47.0)
HGB: 10.6 g/dL — ABNORMAL LOW (ref 12.0–16.0)
LYMPHS PCT: 19.9 %
Lymphocyte #: 1.7 x10 3/mm (ref 1.0–3.6)
MCH: 30.7 pg (ref 26.0–34.0)
MCHC: 33.8 g/dL (ref 32.0–36.0)
MCV: 91 fL (ref 80–100)
MONO ABS: 0.7 x10 3/mm (ref 0.2–0.9)
Monocyte %: 7.6 %
NEUTROS ABS: 5.5 x10 3/mm (ref 1.4–6.5)
Neutrophil %: 63.5 %
PLATELETS: 277 x10 3/mm (ref 150–440)
RBC: 3.44 10*6/uL — AB (ref 3.80–5.20)
RDW: 15.1 % — ABNORMAL HIGH (ref 11.5–14.5)
WBC: 8.7 x10 3/mm (ref 3.6–11.0)

## 2014-07-10 ENCOUNTER — Ambulatory Visit: Payer: Self-pay | Admitting: Internal Medicine

## 2014-07-16 LAB — CBC CANCER CENTER
BASOS ABS: 0.1 x10 3/mm (ref 0.0–0.1)
BASOS PCT: 1.2 %
Eosinophil #: 0.5 x10 3/mm (ref 0.0–0.7)
Eosinophil %: 8.3 %
HCT: 30.1 % — ABNORMAL LOW (ref 35.0–47.0)
HGB: 10 g/dL — AB (ref 12.0–16.0)
LYMPHS ABS: 1.6 x10 3/mm (ref 1.0–3.6)
Lymphocyte %: 26.8 %
MCH: 29.6 pg (ref 26.0–34.0)
MCHC: 33.3 g/dL (ref 32.0–36.0)
MCV: 89 fL (ref 80–100)
MONO ABS: 0.2 x10 3/mm (ref 0.2–0.9)
Monocyte %: 4 %
Neutrophil #: 3.5 x10 3/mm (ref 1.4–6.5)
Neutrophil %: 59.7 %
PLATELETS: 316 x10 3/mm (ref 150–440)
RBC: 3.39 10*6/uL — AB (ref 3.80–5.20)
RDW: 14.7 % — ABNORMAL HIGH (ref 11.5–14.5)
WBC: 5.9 x10 3/mm (ref 3.6–11.0)

## 2014-07-16 LAB — COMPREHENSIVE METABOLIC PANEL
Albumin: 2.9 g/dL — ABNORMAL LOW (ref 3.4–5.0)
Alkaline Phosphatase: 101 U/L
Anion Gap: 8 (ref 7–16)
BILIRUBIN TOTAL: 0.3 mg/dL (ref 0.2–1.0)
BUN: 27 mg/dL — AB (ref 7–18)
CREATININE: 1.61 mg/dL — AB (ref 0.60–1.30)
Calcium, Total: 8.7 mg/dL (ref 8.5–10.1)
Chloride: 100 mmol/L (ref 98–107)
Co2: 29 mmol/L (ref 21–32)
EGFR (Non-African Amer.): 33 — ABNORMAL LOW
GFR CALC AF AMER: 39 — AB
Glucose: 247 mg/dL — ABNORMAL HIGH (ref 65–99)
Osmolality: 287 (ref 275–301)
Potassium: 3.7 mmol/L (ref 3.5–5.1)
SGOT(AST): 14 U/L — ABNORMAL LOW (ref 15–37)
SGPT (ALT): 17 U/L
Sodium: 137 mmol/L (ref 136–145)
Total Protein: 6.9 g/dL (ref 6.4–8.2)

## 2014-07-16 LAB — HEMOGLOBIN A1C: Hemoglobin A1C: 7.7 % — ABNORMAL HIGH (ref 4.2–6.3)

## 2014-07-23 LAB — CBC CANCER CENTER
BASOS PCT: 1.3 %
Basophil #: 0 x10 3/mm (ref 0.0–0.1)
Eosinophil #: 0.1 x10 3/mm (ref 0.0–0.7)
Eosinophil %: 3 %
HCT: 27.9 % — AB (ref 35.0–47.0)
HGB: 9.2 g/dL — ABNORMAL LOW (ref 12.0–16.0)
LYMPHS ABS: 0.8 x10 3/mm — AB (ref 1.0–3.6)
Lymphocyte %: 23.8 %
MCH: 30.7 pg (ref 26.0–34.0)
MCHC: 33 g/dL (ref 32.0–36.0)
MCV: 93 fL (ref 80–100)
MONO ABS: 0.2 x10 3/mm (ref 0.2–0.9)
Monocyte %: 5.3 %
NEUTROS ABS: 2.2 x10 3/mm (ref 1.4–6.5)
Neutrophil %: 66.6 %
Platelet: 289 x10 3/mm (ref 150–440)
RBC: 3.01 10*6/uL — ABNORMAL LOW (ref 3.80–5.20)
RDW: 14.9 % — ABNORMAL HIGH (ref 11.5–14.5)
WBC: 3.3 x10 3/mm — ABNORMAL LOW (ref 3.6–11.0)

## 2014-07-30 LAB — COMPREHENSIVE METABOLIC PANEL
ALBUMIN: 2.9 g/dL — AB (ref 3.4–5.0)
Alkaline Phosphatase: 118 U/L — ABNORMAL HIGH
Anion Gap: 10 (ref 7–16)
BILIRUBIN TOTAL: 0.2 mg/dL (ref 0.2–1.0)
BUN: 17 mg/dL (ref 7–18)
CO2: 26 mmol/L (ref 21–32)
Calcium, Total: 8.3 mg/dL — ABNORMAL LOW (ref 8.5–10.1)
Chloride: 102 mmol/L (ref 98–107)
Creatinine: 1.61 mg/dL — ABNORMAL HIGH (ref 0.60–1.30)
EGFR (African American): 39 — ABNORMAL LOW
GFR CALC NON AF AMER: 33 — AB
GLUCOSE: 220 mg/dL — AB (ref 65–99)
Osmolality: 284 (ref 275–301)
POTASSIUM: 4.3 mmol/L (ref 3.5–5.1)
SGOT(AST): 15 U/L (ref 15–37)
SGPT (ALT): 20 U/L
Sodium: 138 mmol/L (ref 136–145)
Total Protein: 6.8 g/dL (ref 6.4–8.2)

## 2014-07-30 LAB — CBC CANCER CENTER
BASOS PCT: 3.2 %
Basophil #: 0.2 x10 3/mm — ABNORMAL HIGH (ref 0.0–0.1)
Eosinophil #: 0.4 x10 3/mm (ref 0.0–0.7)
Eosinophil %: 7.4 %
HCT: 29.2 % — ABNORMAL LOW (ref 35.0–47.0)
HGB: 9.7 g/dL — ABNORMAL LOW (ref 12.0–16.0)
Lymphocyte #: 1.4 x10 3/mm (ref 1.0–3.6)
Lymphocyte %: 28.6 %
MCH: 29.8 pg (ref 26.0–34.0)
MCHC: 33.2 g/dL (ref 32.0–36.0)
MCV: 90 fL (ref 80–100)
MONO ABS: 0.6 x10 3/mm (ref 0.2–0.9)
Monocyte %: 11.1 %
Neutrophil #: 2.5 x10 3/mm (ref 1.4–6.5)
Neutrophil %: 49.7 %
Platelet: 359 x10 3/mm (ref 150–440)
RBC: 3.25 10*6/uL — AB (ref 3.80–5.20)
RDW: 15 % — ABNORMAL HIGH (ref 11.5–14.5)
WBC: 5 x10 3/mm (ref 3.6–11.0)

## 2014-07-30 LAB — MAGNESIUM: Magnesium: 2.1 mg/dL

## 2014-08-10 ENCOUNTER — Ambulatory Visit: Payer: Self-pay | Admitting: Internal Medicine

## 2014-08-10 ENCOUNTER — Ambulatory Visit: Payer: Self-pay | Admitting: Oncology

## 2014-08-14 LAB — CBC CANCER CENTER
BASOS ABS: 0.1 x10 3/mm (ref 0.0–0.1)
Basophil %: 1.4 %
Eosinophil #: 0.3 x10 3/mm (ref 0.0–0.7)
Eosinophil %: 4.8 %
HCT: 30.6 % — AB (ref 35.0–47.0)
HGB: 10.1 g/dL — AB (ref 12.0–16.0)
Lymphocyte #: 1.5 x10 3/mm (ref 1.0–3.6)
Lymphocyte %: 24.5 %
MCH: 29.3 pg (ref 26.0–34.0)
MCHC: 33 g/dL (ref 32.0–36.0)
MCV: 89 fL (ref 80–100)
MONOS PCT: 8 %
Monocyte #: 0.5 x10 3/mm (ref 0.2–0.9)
NEUTROS ABS: 3.8 x10 3/mm (ref 1.4–6.5)
Neutrophil %: 61.3 %
Platelet: 283 x10 3/mm (ref 150–440)
RBC: 3.45 10*6/uL — ABNORMAL LOW (ref 3.80–5.20)
RDW: 15.5 % — AB (ref 11.5–14.5)
WBC: 6.1 x10 3/mm (ref 3.6–11.0)

## 2014-08-14 LAB — COMPREHENSIVE METABOLIC PANEL
ALT: 18 U/L
ANION GAP: 9 (ref 7–16)
AST: 15 U/L (ref 15–37)
Albumin: 2.9 g/dL — ABNORMAL LOW (ref 3.4–5.0)
Alkaline Phosphatase: 118 U/L — ABNORMAL HIGH
BUN: 11 mg/dL (ref 7–18)
Bilirubin,Total: 0.3 mg/dL (ref 0.2–1.0)
CHLORIDE: 98 mmol/L (ref 98–107)
Calcium, Total: 8.1 mg/dL — ABNORMAL LOW (ref 8.5–10.1)
Co2: 28 mmol/L (ref 21–32)
Creatinine: 1.52 mg/dL — ABNORMAL HIGH (ref 0.60–1.30)
EGFR (Non-African Amer.): 35 — ABNORMAL LOW
GFR CALC AF AMER: 42 — AB
GLUCOSE: 315 mg/dL — AB (ref 65–99)
Osmolality: 282 (ref 275–301)
POTASSIUM: 4.2 mmol/L (ref 3.5–5.1)
Sodium: 135 mmol/L — ABNORMAL LOW (ref 136–145)
Total Protein: 6.7 g/dL (ref 6.4–8.2)

## 2014-08-14 LAB — MAGNESIUM: MAGNESIUM: 1.9 mg/dL

## 2014-08-21 LAB — CBC CANCER CENTER
BASOS PCT: 1 %
Basophil #: 0.1 x10 3/mm (ref 0.0–0.1)
EOS ABS: 0.5 x10 3/mm (ref 0.0–0.7)
EOS PCT: 7.4 %
HCT: 29.5 % — ABNORMAL LOW (ref 35.0–47.0)
HGB: 9.7 g/dL — AB (ref 12.0–16.0)
LYMPHS ABS: 2 x10 3/mm (ref 1.0–3.6)
Lymphocyte %: 29.1 %
MCH: 28.9 pg (ref 26.0–34.0)
MCHC: 33 g/dL (ref 32.0–36.0)
MCV: 88 fL (ref 80–100)
MONO ABS: 0.3 x10 3/mm (ref 0.2–0.9)
Monocyte %: 4.9 %
Neutrophil #: 3.9 x10 3/mm (ref 1.4–6.5)
Neutrophil %: 57.6 %
Platelet: 310 x10 3/mm (ref 150–440)
RBC: 3.37 10*6/uL — ABNORMAL LOW (ref 3.80–5.20)
RDW: 15.9 % — ABNORMAL HIGH (ref 11.5–14.5)
WBC: 6.8 x10 3/mm (ref 3.6–11.0)

## 2014-08-28 LAB — CBC CANCER CENTER
BASOS PCT: 1.8 %
Basophil #: 0.1 x10 3/mm (ref 0.0–0.1)
Eosinophil #: 0.1 x10 3/mm (ref 0.0–0.7)
Eosinophil %: 2.8 %
HCT: 29 % — AB (ref 35.0–47.0)
HGB: 9.6 g/dL — AB (ref 12.0–16.0)
LYMPHS ABS: 2.8 x10 3/mm (ref 1.0–3.6)
Lymphocyte %: 55.3 %
MCH: 29 pg (ref 26.0–34.0)
MCHC: 33 g/dL (ref 32.0–36.0)
MCV: 88 fL (ref 80–100)
MONOS PCT: 3.8 %
Monocyte #: 0.2 x10 3/mm (ref 0.2–0.9)
NEUTROS ABS: 1.8 x10 3/mm (ref 1.4–6.5)
Neutrophil %: 36.3 %
Platelet: 299 x10 3/mm (ref 150–440)
RBC: 3.3 10*6/uL — AB (ref 3.80–5.20)
RDW: 16.4 % — AB (ref 11.5–14.5)
WBC: 5.1 x10 3/mm (ref 3.6–11.0)

## 2014-08-28 LAB — COMPREHENSIVE METABOLIC PANEL
ALBUMIN: 3.1 g/dL — AB (ref 3.4–5.0)
ALK PHOS: 93 U/L
ANION GAP: 9 (ref 7–16)
BILIRUBIN TOTAL: 0.3 mg/dL (ref 0.2–1.0)
BUN: 19 mg/dL — AB (ref 7–18)
CALCIUM: 7.9 mg/dL — AB (ref 8.5–10.1)
CREATININE: 1.45 mg/dL — AB (ref 0.60–1.30)
Chloride: 104 mmol/L (ref 98–107)
Co2: 26 mmol/L (ref 21–32)
EGFR (African American): 44 — ABNORMAL LOW
EGFR (Non-African Amer.): 37 — ABNORMAL LOW
GLUCOSE: 135 mg/dL — AB (ref 65–99)
Osmolality: 282 (ref 275–301)
POTASSIUM: 3.5 mmol/L (ref 3.5–5.1)
SGOT(AST): 12 U/L — ABNORMAL LOW (ref 15–37)
SGPT (ALT): 17 U/L
SODIUM: 139 mmol/L (ref 136–145)
TOTAL PROTEIN: 6.5 g/dL (ref 6.4–8.2)

## 2014-09-10 ENCOUNTER — Ambulatory Visit: Payer: Self-pay | Admitting: Internal Medicine

## 2014-10-09 ENCOUNTER — Ambulatory Visit
Admit: 2014-10-09 | Disposition: A | Discharge status: Home / Self Care | Payer: Self-pay | Attending: Oncology | Admitting: Oncology

## 2014-11-09 ENCOUNTER — Ambulatory Visit
Admit: 2014-11-09 | Disposition: A | Discharge status: Home / Self Care | Payer: Self-pay | Attending: Oncology | Admitting: Oncology

## 2014-11-20 LAB — CBC CANCER CENTER
BASOS PCT: 1.7 %
Basophil #: 0.1 x10 3/mm (ref 0.0–0.1)
EOS ABS: 0.3 x10 3/mm (ref 0.0–0.7)
Eosinophil %: 5.6 %
HCT: 28.4 % — ABNORMAL LOW (ref 35.0–47.0)
HGB: 9.3 g/dL — ABNORMAL LOW (ref 12.0–16.0)
LYMPHS ABS: 1.2 x10 3/mm (ref 1.0–3.6)
Lymphocyte %: 20.3 %
MCH: 27.2 pg (ref 26.0–34.0)
MCHC: 32.6 g/dL (ref 32.0–36.0)
MCV: 84 fL (ref 80–100)
MONO ABS: 0.4 x10 3/mm (ref 0.2–0.9)
MONOS PCT: 7.1 %
NEUTROS PCT: 65.3 %
Neutrophil #: 4 x10 3/mm (ref 1.4–6.5)
PLATELETS: 272 x10 3/mm (ref 150–440)
RBC: 3.4 10*6/uL — AB (ref 3.80–5.20)
RDW: 16.2 % — AB (ref 11.5–14.5)
WBC: 6.1 x10 3/mm (ref 3.6–11.0)

## 2014-11-20 LAB — COMPREHENSIVE METABOLIC PANEL
AST: 20 U/L
Albumin: 3.4 g/dL — ABNORMAL LOW
Alkaline Phosphatase: 70 U/L
Anion Gap: 8 (ref 7–16)
BUN: 15 mg/dL
Bilirubin,Total: 0.6 mg/dL
Calcium, Total: 8.5 mg/dL — ABNORMAL LOW
Chloride: 98 mmol/L — ABNORMAL LOW
Co2: 27 mmol/L
Creatinine: 1.54 mg/dL — ABNORMAL HIGH
EGFR (African American): 36 — ABNORMAL LOW
EGFR (Non-African Amer.): 31 — ABNORMAL LOW
Glucose: 151 mg/dL — ABNORMAL HIGH
Potassium: 3.9 mmol/L
SGPT (ALT): 8 U/L — ABNORMAL LOW
SODIUM: 133 mmol/L — AB
TOTAL PROTEIN: 7.1 g/dL

## 2014-11-20 LAB — MAGNESIUM: Magnesium: 1.8 mg/dL

## 2014-11-28 ENCOUNTER — Emergency Department: Admit: 2014-11-28 | Disposition: A | Discharge status: Home / Self Care | Payer: Self-pay | Admitting: Student

## 2014-11-28 LAB — COMPREHENSIVE METABOLIC PANEL
ALBUMIN: 3.2 g/dL — AB
ANION GAP: 10 (ref 7–16)
Alkaline Phosphatase: 77 U/L
BUN: 20 mg/dL
Bilirubin,Total: 0.7 mg/dL
CALCIUM: 8.7 mg/dL — AB
Chloride: 96 mmol/L — ABNORMAL LOW
Co2: 28 mmol/L
Creatinine: 1.49 mg/dL — ABNORMAL HIGH
EGFR (Non-African Amer.): 32 — ABNORMAL LOW
GFR CALC AF AMER: 37 — AB
GLUCOSE: 190 mg/dL — AB
Potassium: 3.9 mmol/L
SGOT(AST): 20 U/L
SGPT (ALT): 12 U/L — ABNORMAL LOW
SODIUM: 134 mmol/L — AB
Total Protein: 7.4 g/dL

## 2014-11-28 LAB — LIPASE, BLOOD: LIPASE: 20 U/L — AB

## 2014-11-28 LAB — CBC WITH DIFFERENTIAL/PLATELET
BASOS PCT: 0.9 %
Basophil #: 0.1 10*3/uL (ref 0.0–0.1)
Eosinophil #: 0.1 10*3/uL (ref 0.0–0.7)
Eosinophil %: 1.3 %
HCT: 29.9 % — AB (ref 35.0–47.0)
HGB: 9.7 g/dL — ABNORMAL LOW (ref 12.0–16.0)
Lymphocyte #: 0.8 10*3/uL — ABNORMAL LOW (ref 1.0–3.6)
Lymphocyte %: 9.9 %
MCH: 27 pg (ref 26.0–34.0)
MCHC: 32.6 g/dL (ref 32.0–36.0)
MCV: 83 fL (ref 80–100)
MONO ABS: 0.9 x10 3/mm (ref 0.2–0.9)
Monocyte %: 10.8 %
NEUTROS ABS: 6.5 10*3/uL (ref 1.4–6.5)
Neutrophil %: 77.1 %
Platelet: 273 10*3/uL (ref 150–440)
RBC: 3.61 10*6/uL — AB (ref 3.80–5.20)
RDW: 16.4 % — ABNORMAL HIGH (ref 11.5–14.5)
WBC: 8.5 10*3/uL (ref 3.6–11.0)

## 2014-11-28 LAB — URINALYSIS, COMPLETE
Bilirubin,UR: NEGATIVE
Glucose,UR: NEGATIVE mg/dL (ref 0–75)
Ketone: NEGATIVE
NITRITE: NEGATIVE
PH: 6 (ref 4.5–8.0)
Protein: 30
Specific Gravity: 1.008 (ref 1.003–1.030)

## 2014-11-28 LAB — TROPONIN I

## 2014-11-30 NOTE — Discharge Summary (Signed)
PATIENT NAME:  Kristen Barron, Kristen Barron MR#:  [id number] DATE OF BIRTH:  October 09, 1928  DATE OF ADMISSION:  03/28/2013 DATE OF DISCHARGE:  04/03/2013  This is an addendum to earlier interim discharge summary done by Dr. Gladstone Lighter on August 24th 2014.   For earlier course please see H and P done by Dr. Wilfred Curtis on August 17th 2014 and interim discharge summary by Dr. Gladstone Lighter on the 24th of August 2014   DISCHARGE DIAGNOSES: 1.  Dizziness and vertigo.  2.  Left facial droop; transient ischemic attack.  3.  Hypertension.  4.  Hyponatremia, possible syndrome of inappropriate antidiuretic hormone secretion.  5.  Diabetes mellitus.   CONDITION ON DISCHARGE: Stable.   CODE STATUS: FULL CODE.   MEDICATIONS ON DISCHARGE: 1.  Pioglitazone 30 mg oral tablet once a day.  2.  Metoprolol 100 mg extended-release once a day.  3.  Venlafaxine extended-release 75 mg oral capsule once a day for depression.   4.  Oxybutynin 5 mg oral tablet once a day in the morning, and 2 tablets in the evening.  5. Trazodone 50 mg oral tablet at bedtime for sleep.  6.  Pravastatin 80 mg for cholesterol.  7.  Omeprazole 20 mg delayed-release capsule once a day.  8.  Aspirin, enteric-coated, 81 mg once a day.  9.  Glipizide 10 mg oral tablet extended-release 2 times a day for diabetes.  10.  Osteo BiFlex 2 times a day for joint pain.  11.  Clopidogrel 75 mg once a day.  12.  Amlodipine 10 mg oral tablet once a day.  13.  Furosemide 20 mg once a day.  14.  Hydralazine 25 mg 3 times a day.   DIET ON DISCHARGE: Low sodium, carbonate-controlled ADA diet.   DIET CONSISTENCY: Diet supplement, Glucerna. Diet supplement frequency 2 times per day. Consistency: Regular.   ACTIVITY: As tolerated.   TIMEFRAME TO FOLLOWUP: Within 1 to 2 weeks.   HOSPITAL COURSE AND STAY: Please see H and P and interim discharge summary on 24th of August.   Patient's dizziness was improved. MRI brain was normal, and echo showed  diastolic dysfunction.   Hyponatremia: The patient was known to her have hyponatremia. Baseline was around 130, possible SIADH. Extensive workup done by nephrology consult, and the patient's sodium was 130 on the day of discharge.   Hypertension: We continued the medication, and we finally discharge her to the assisted living facility.   Total time Spent on this discharge: Thirty-five minutes.   Please see interim discharge summary on the 24th of August by Dr. Gladstone Lighter.     ____________________________ Ceasar Lund. Anselm Jungling, MD vgv:dm D: 04/06/2013 07:23:51 ET T: 04/06/2013 08:37:06 ET JOB#: [id number]  cc: Ceasar Lund. Anselm Jungling, MD, <Dictator> Vaughan Basta MD ELECTRONICALLY SIGNED 04/11/2013 0:48

## 2014-11-30 NOTE — H&P (Signed)
PATIENT NAME:  Kristen Barron, Kristen Barron MR#:  [id number] DATE OF BIRTH:  Sep 10, 1928  DATE OF ADMISSION:  03/26/2013  PRIMARY CARE PHYSICIAN:  [facility].  She sees Denton Lank, M.D.  REFERRING PHYSICIAN:  Delman Kitten, M.D.   CHIEF COMPLAINT:  Numbness in the forehead area and left facial droop.   HISTORY OF PRESENT ILLNESS:  Kristen Barron is an 81 year old Caucasian female with a past history of systemic hypertension, diabetes mellitus type 2, history of transient ischemic attack.  She was in her usual state of health until about Wednesday, that is four days ago, when she developed numbness located at the forehead area.  The next day, that is Thursday, she went to see her primary care physician, but she tells me that nothing specific was done.  Symptoms persisted until yesterday when she noticed some weakness on the left facial area.  This is witnessed by her granddaughter who told her that she has left facial droop.  Therefore, the patient was transported to the hospital for further evaluation, during which the event had subsided.  She reports no weakness in her upper or lower extremities.  Denies any visual changes.  No slurred speech.  No difficulty swallowing.  Right now, she is asymptomatic.  Her initial blood pressure when she came to the hospital was 221/90.  The patient was admitted to the hospital for observation and follow up on her neurologic status and also to control blood pressure.   REVIEW OF SYSTEMS:  CONSTITUTIONAL:  Denies any fever.  No chills.  No fatigue.  EYES:  No blurring of vision.  No double vision.  EARS, NOSE, THROAT:  No hearing impairment.  No sore throat.  No dysphagia.  CARDIOVASCULAR:  No chest pain.  She has chronic exertional dyspnea, unchanged.  No syncope.  RESPIRATORY:  No cough.  No chest pain.  She has chronic exertional dyspnea.  No hemoptysis.  GASTROINTESTINAL:  No abdominal pain, no vomiting, no diarrhea.  GENITOURINARY:  No dysuria.  No frequency of urination.   MUSCULOSKELETAL:  No joint pain or swelling.  No muscular pain or swelling.  INTEGUMENTARY:  No skin rash.  No ulcers.  NEUROLOGY:  No focal weakness.  No seizure activity.  No headache, but she has numbness on the left forehead area and as I described above she had temporary left facial droop which had resolved.  PSYCHIATRY:  No anxiety.  No depression.  ENDOCRINE:  No polyuria or polydipsia.  No heat or cold intolerance.   PAST MEDICAL HISTORY:  Systemic hypertension, diabetes mellitus type 2, chronic kidney disease stage 4, history of transient ischemic attack, gastroesophageal reflux disease.   PAST SURGICAL HISTORY:  Back surgery, excision of benign breast mass and history of stent placement in her ureter.   SOCIAL HABITS:  Remote history of mild smoking when she was young.  No history of alcohol or drug abuse.   SOCIAL HISTORY:  She is separated from her husband.  She lives at home with her granddaughter.   FAMILY HISTORY:  Her father died from a heart attack.  She does not recall his age.  Her mother died in her 65s and she suffered from stroke.   ADMISSION MEDICATIONS:  Quinapril 40 mg once a day, metoprolol succinate 100 mg once a day, Levemir 18 units once a day in the evening, glipizide XL 10 mg twice a day, Actos 30 mg once a day.  Pravastatin 80 mg once a day, venlafaxine 1 capsule once a day dose  was not specified, it was 75 mg daily, trazodone 50 mg 1 to 2 tablets in the evening.  Omeprazole 20 mg a day, oxybutynin 5 mg in the morning and 10 at night.  Furosemide 20 mg twice a day, aspirin 81 mg a day.  Albuterol inhalation as needed.   ALLERGIES:  No known drug allergies.   PHYSICAL EXAMINATION: VITAL SIGNS:  Blood pressure 157/57, respiratory rate 18, pulse 69, temperature 98.2, oxygen saturation 98%.  GENERAL APPEARANCE:  This is an elderly female lying in bed in no acute distress.  HEAD AND NECK:  No pallor.  No icterus.  No cyanosis.  Ear examination revealed normal  hearing, no discharge, no lesions.  Examination of the nose showed no bleeding, no discharge, no ulcers.  Oropharyngeal examination showed normal lips and tongue.  No oral thrush, no ulcers.  She has partial dentures.  Eye examination revealed normal eyelids and conjunctivae.  Pupils about 3 to 4 mm, round, equal and sluggishly reactive to light.  NECK:  Supple.  Trachea at midline.  No thyromegaly.  No cervical lymphadenopathy.  No masses.  HEART:  Revealed normal S1, S2.  No S3 or S4.  No murmur.  No gallop.  No carotid bruits.  RESPIRATORY:  Revealed normal breathing pattern without use of accessory muscles.  No rales.  No wheezing.  ABDOMEN:  Soft without tenderness.  No hepatosplenomegaly.  No masses.  No hernias.  SKIN:  Revealed no ulcers.  No subcutaneous nodules.  MUSCULOSKELETAL:  No joint swelling.  No clubbing.  NEUROLOGIC:  Cranial nerves II through XII are intact.  No focal motor deficit.  PSYCHIATRIC:  The patient is alert and oriented x 3.  Mood and affect were normal.   LABORATORY FINDINGS:  CAT scan of the head without contrast showed cerebral atrophy.  Left sphenoid and maxillary sinusitis.  This is reported to be acute sinusitis according to the report.  Serum glucose 110, BUN 35, creatinine 1.7, sodium 122.  Her sodium in January of this year was ranging between 126 to 133.  Liver function tests and liver transaminases were normal except for a slightly low albumin at 3.3.  CBC showed a white count of 7000, hemoglobin 10.6, hematocrit 30, platelet count 249.  Her baseline hemoglobin is 9.5.   ASSESSMENT: 1.  Transient ischemic attack manifested by forehead numbness associated with transient left facial droop that had subsided.  2.  Hyponatremia reaching 122.  The patient has chronic hyponatremia.  3.  Diabetes mellitus, type 2.  4.  Chronic kidney disease stage 4.  5.  Chronic exertional dyspnea.    6.  Mild to moderate pulmonary hypertension.  7.  Diastolic congestive heart  failure with ejection fraction of 55% by echocardiogram in January of 2014.  8.  Hypertensive emergency.  Her blood pressure was more than 710 systolic when she came.  This had improved since with the initial management.   9.  Finally, there is a prior history of transient ischemic attack in the past.   PLAN:  We will admit the patient for observation, follow up on her neurologic status.  Neuro checks q. 2 hours and then q. 4 hours.  Increase aspirin from 81 mg to 325 mg.  Obtain carotid ultrasound and echocardiogram.  Accu-Chek and sliding scale and continue Levemir.  Control the blood pressure.  It went down from 626 systolic to 948 now.  Regarding the hyponatremia, I will discontinue IV fluids given in the Emergency Department and restrict her  fluid intake and keep just hep-locked.  I will hold Lasix.  Repeat BNP tomorrow.  For her sinusitis, she was given Zithromax at the Emergency Department.  I will continue oral Zithromax.  For deep vein thrombosis prophylaxis, we will place her on subcutaneous heparin.  The patient indicates to me that she does not have a LIVING WILL, however her CODE STATUS is FULL CODE.   Time spent in evaluating this patient took more than 1 hour.     ____________________________ Clovis Pu. Lenore Manner, MD amd:ea D: 03/26/2013 05:19:43 ET T: 03/26/2013 06:03:58 ET JOB#: [id number]  cc: Clovis Pu. Lenore Manner, MD, <Dictator> Ellin Saba MD ELECTRONICALLY SIGNED 03/26/2013 21:41

## 2014-11-30 NOTE — Discharge Summary (Signed)
PATIENT NAME:  Kristen Barron, Kristen Barron MR#:  [id number] DATE OF BIRTH:  Jul 05, 1929  DATE OF ADMISSION:  08/18/2012 DATE OF DISCHARGE:  08/20/2012  ADMITTING PHYSICIAN:  Farrell Sink, MD   DISCHARGING PHYSICIAN:  Gladstone Lighter, MD   PRIMARY CARE PHYSICIAN: Denton Lank, MD  [place]: None.   DISCHARGE DIAGNOSES: 1.  Acute bronchitis.  2.  Acute renal failure.  3.  Chronic kidney disease. Creatinine baseline of 1.3 to 1.5.  4.  Hypertension.  5.  Gastroesophageal reflux disease.  6.  Diabetes mellitus.   DISCHARGE HOME MEDICATIONS:  1.  Quinapril 40 mg p.o. daily.  2.  Amitriptyline 50 mg at bedtime.  3.  Glucotrol 5 mg p.o. daily.  4.  Aspirin 81 mg p.o. daily.  5.  Prilosec 20 mg p.o. daily.  6.  Ferrous sulfate 325 mg p.o. b.i.d.  7.  Levaquin 250 mg p.o. daily for 4 more days.  8.  Albuterol inhaler 2 puffs every 6 hours p.r.n. Patient advised to hold her metformin and Lasix for now, due to her acute renal failure on admission.   DISCHARGE DIET: Low sodium, ADA diet.   DISCHARGE ACTIVITY: As tolerated.     FOLLOW-UP INSTRUCTIONS: 1.  PCP follow-up in 1 week.  2.  Advised to hold metformin and Lasix for another 4 days and restart them at regular dose as follows:   a. Lasix 20 mg p.o. daily.   b. Metformin 500 mg p.o. b.i.d.   LABS AND IMAGING STUDIES PRIOR TO DISCHARGE: Sodium 133, potassium 3.9, chloride 102, bicarbonate 24, BUN 28, creatinine 1.49 and glucose of 108.   Ultrasound of kidneys bilaterally, showing diffuse renal cortical thinning. Urine culture is growing 30,000 colonies of coagulase-negative staph.  WBC on admission of 10.1, hemoglobin 9.5, hematocrit 28.3, platelet count is 311. Cardiac enzymes negative. BNP was 869.   BRIEF HOSPITAL COURSE: The patient is a pleasant 81 year old Caucasian female with past medical history significant for CKD stage III, diabetes, reflux and hypertension, presented to the hospital secondary to  generalized weakness, cough was not getting better and labs done as an outpatient showing worsening, creatinine and low sodium.  1.  Acute renal failure secondary to prerenal conditions, dehydration probably from her bronchitis and was also causing hyponatremia. She was given gentle fluids while in the hospital. Her diuretics Lasix were held and once her creatinine is back to baseline, she is being discharged home. Her admission creatinine was 1.94. Initially, quinapril and Lasix were held. Quinapril is being restarted at discharge. Lasix and metformin will be started as an outpatient.  2.  Diabetes mellitus. Metformin was held. Glucotrol is being continued. She will start metformin as an outpatient in a few days.  3. Acute bronchitis. She was recovering from an acute bronchitis attack. She was on Levaquin that was just started as an outpatient and she will be taking Levaquin back. Her breathing has improved. She was initially on oxygen and at the time of discharge is saturating well on room. She will finish off her course of Levaquin and is not requiring any home oxygen. Her course has been otherwise uneventful in the hospital.   DISCHARGE CONDITION: Stable.   DISCHARGE DISPOSITION: Home, as she worked with physical therapy, who recommended that the patient can go home.   TIME SPENT ON DISCHARGE: 45 minutes.     ____________________________ Gladstone Lighter, MD rk:cc D: 08/22/2012 14:40:22 ET T: 08/22/2012 21:42:42 ET JOB#: [id number]  cc: Gladstone Lighter, MD, <Dictator> Sarah "  Baltazar Apo, MD Gladstone Lighter MD ELECTRONICALLY SIGNED 08/31/2012 19:30

## 2014-11-30 NOTE — H&P (Signed)
PATIENT NAME:  Kristen Barron, Kristen Barron MR#:  [id number] DATE OF BIRTH:  06/05/1929  DATE OF ADMISSION:  08/18/2012  CHIEF COMPLAINT: Mild dehydration. Called by primary care physician telling her to check into the ER due to abnormal labs.   PRIMARY CARE PHYSICIAN: Dr. Denton Lank.  REFERRING PHYSICIAN: Dr. Conni Slipper.   HISTORY OF PRESENT ILLNESS: The patient is a nice 81 year old female who has history of diabetes, hypertension, GERD, TIA and degenerative disk disease who was previously seen about 3 days ago due to acute bronchitis, for which she was prescribed Levaquin and sent home. She followed up with her primary care physician who did some labs, and today she asked her to come to the ER due to the fact that she was getting a little bit weaker, slightly dehydrated and with abnormal labs including low sodium and increased creatinine. The patient has been actually doing a little bit better as far as her respiratory infection. She has been taking her Levaquin, which is prescribed once a day, although with her kidney failure should be redosed. The patient says that she has not been drinking enough fluids and feeling weak. The patient is not safe to go home today. She is not going to be in company of her family. She lives with her ex-husband, who is actually very low functioning, and her granddaughter but her granddaughter is not going to be around today. She will be around over the weekend, for what I think she might be a good discharge for tomorrow.   REVIEW OF SYSTEMS: A 12-system review of systems is done.  CONSTITUTIONAL: No fever. Positive fatigue. Positive weakness.  EYES: No blurry vision, double vision or pain in her eyes.  ENT: No discharge. No upper respiratory infection. No difficulty swallowing.  RESPIRATORY: Positive cough and wheezing, for what she has been taking Levaquin and it has improved. Negative hemoptysis. Negative TB or pneumonia.  CARDIOVASCULAR: No chest pain, orthopnea, edema or  syncopal episodes.   GASTROINTESTINAL: No nausea, vomiting. No abdominal pain. No constipation or diarrhea.  GENITOURINARY: No dysuria, hematuria or increased frequency. The patient has noticed decreased urine output. GYNECOLOGIC: No breast masses.  ENDOCRINE: No polyuria, polydipsia, polyphagia. No cold or heat intolerance.  HEMATOLOGIC, LYMPHATIC: No anemia or easy bruising.  SKIN: No new lesions. No rashes.  MUSCULOSKELETAL: No significant back pain or joint pain. No joint effusions.  NEUROLOGIC: No numbness. No tingling. No CVA. No ataxia. The patient had a history of TIA in the past.  PSYCHIATRIC: No anxiety. No depression. No nervousness.   PAST MEDICAL HISTORY:  1.  Hypertension.  2.  Gastroesophageal reflux.  3.  Diabetes, non-insulin-dependent, well controlled.  4.  History of TIA.  5.  DJD.  6.  CKD. 7.  History of benign breast tumor.   PAST SURGICAL HISTORY:  1.  Positive for excision of benign breast tumor.  2.  Back surgery in the past.  3.  History of ureteral stent.  ALLERGIES: No known drug allergies.   SOCIAL HISTORY: The patient states that she does not smoke, but when asked more clearly, she says that occasionally she puffs a cigarette but she will not inhale. Denies any alcohol use. No drug use. At this moment, she is living with her granddaughter and her ex-husband. Her ex-husband is very low functioning and they took him into her house because he was living with somebody else who died.   FAMILY HISTORY: Positive for coronary artery disease in her father who died from an  MI. Denies any cancer or diabetes in the family.   CURRENT MEDICATIONS: Include quinapril 40 mg once daily, amitriptyline 50 mg at bedtime, Glucotrol 5 mg once daily, metformin 500 mg twice daily, furosemide 20 mg once daily, ferrous sulfate 325 mg twice daily, omeprazole 20 mg once daily, Levaquin 500 mg once a day for 7 days. She is on day #3.   PHYSICAL EXAMINATION:  VITAL SIGNS: Blood  pressure 135/61, pulse 69, respirations 18, temperature 98, o2 sats 97% on room air.  GENERAL: The patient is alert, oriented x 3. No acute distress. No respiratory distress. She is hemodynamically stable.  HEENT: Pupils are equal and reactive. Extraocular movements are intact. Mucosa moist at this moment. Anicteric sclerae. Pale conjunctivae. No oral lesions. No oropharyngeal exudates. No geographic tongue.  NECK: Supple. No JVD. No thyromegaly. No adenopathy. No carotid bruits.  CARDIOVASCULAR: Regular rate and rhythm. No murmurs, rubs or gallops. No displacement of PMI.  LUNGS: Clear without any wheezing or crepitus. No use of accessory muscles.  ABDOMEN: Soft, nontender, nondistended. No hepatosplenomegaly. No masses. Bowel sounds are positive.   EXTREMITIES: Positive edema, +1. No cyanosis, no clubbing.  SKIN: Without any rashes or petechiae.  LYMPHATICS: Negative for lymphadenopathy in neck or supraclavicular areas.  NEUROLOGIC: Cranial nerves II through XII intact. No focal findings.  PSYCHIATRIC: Negative for anxiety or agitation.  MUSCULOSKELETAL: No significant joint abnormalities. No tenderness to palpation of the spine processes.   LABORATORY, DIAGNOSTIC AND RADIOLOGICAL DATA: Glucose 142, BUN 31, creatinine 1.94. Her creatinine on January 6 was 1.39. Her sodium was 126, rechecked was 131 after IV fluids given. LFTs show a slight increase in AST up to 40. Cardiac enzymes are negative. Hemoglobin 9.5, white count 10.1, platelets 311. EKG: Normal sinus rhythm. Chest x-ray: No evidence of CHF or pneumonia.   ASSESSMENT AND PLAN: An 81 year old female with history of diabetes, hypertension, gastroesophageal reflux disease, chronic kidney disease, chronic disease anemia, degenerative disk disease and a previous transient ischemic attack, who comes with abnormal labs from the primary care physician's office and also complaining of weakness and mild dehydration.  1.  Dehydration. The patient  has not been drinking very well, likely due to her upper respiratory infection that has been treated for the past 3 days. The patient is hemodynamically stable with good blood pressure. She is not tachycardic but unfortunately, she is not a safe discharge since she lives pretty much in company of her ex-husband, who is very low functioning, and her granddaughter who is not going to be around tonight. She probably will be able to go home maybe with home health tomorrow, or at least under the good supervision of her granddaughter if she is with her. We are going to keep her on IV fluids overnight; 1 liter is going to be given. Signs of dehydration were her acute kidney injury and her decreased sodium that actually improved after IV fluids were given.  2.  Acute kidney injury. The patient has acute on chronic kidney disease. Her creatinine was 1.39 three days ago. Today it is 1.94. Some of her medications need to be redosed, especially the antibiotics/Levaquin, and she is on metformin, which we are going to stop at this moment and continue Glucotrol and insulin sliding scale. We are going to hold on her quinapril as well due to her increased creatinine.  3.  Diabetes, seems to be stable. Continue medications as above.  4.  Hypertension. The patient should avoid blood pressures in the 160s/60s. Right  now it is 135/61. Continue to observe. Stop quinapril and add on as-needed medications. The patient is on Lasix, for what we are going to hold it as well.   TIME SPENT: I spent about 40 minutes with this patient today. She is a full code.   ____________________________ Kaanapali Sink, MD rsg:jm D: 08/18/2012 19:09:35 ET T: 08/18/2012 20:05:24 ET JOB#: [id number]  cc: [place] Sink, MD, <Dictator> Aariyah Sampey America Brown MD ELECTRONICALLY SIGNED 08/19/2012 13:50

## 2014-12-01 NOTE — H&P (Signed)
PATIENT NAME:  Kristen Barron, Kristen Barron MR#:  [id number] DATE OF BIRTH:  07-27-1929  DATE OF ADMISSION:  03/21/2014   REFERRING PHYSICIAN:  Brunilda Payor A. Edd Fabian, MD  PRIMARY CARE PHYSICIAN: [place], MD   CHIEF COMPLAINT: Fever, chills, and erythema around recent right total mastectomy site.   HISTORY OF PRESENT ILLNESS: This is an 81 year old female with a history of diabetes, hypertension, transient ischemic attack, chronic kidney disease. The patient is with a recent diagnosis of breast cancer and underwent right total mastectomy with sentinel node biopsy on July 28. The patient presents with complaints of fever and chills for 1 day. As well, reports some erythema and tenderness around the surgical site and the right abdominal area. The patient had leukocytosis at 17,000, was febrile at 100.6. Her urine was negative. Denied any cough, any productive sputum, any dysuria, any polyuria, any shortness of breath, any hemoptysis, any leg swelling or calf tenderness.   The patient on the physical exam was noticed to have erythema of the right abdominal area. The patient had a drain around the surgical site which has been draining sanguineous material, which the patient reports has not changed in quantity or quality. The edges of the surgical scar were  clean and actually the erythema is lower, around the abdominal area and the drain site.    The patient had blood cultures sent. He was started on broad-spectrum IV antibiotics, and the ED physician contacted surgery and called Dr. Jamal Collin, who wanted the patient to be admitted, but at this point he does not  recommended any further imaging until he sees the patient in the morning.   PAST MEDICAL HISTORY:  1.  Diabetes.  2.  Hypertension.  3.  Congestive heart failure.  4.  Breast cancer, status post mastectomy of the right breast with sentinel node biopsy.  5.  Transient ischemic attack.  6.  Depression.  7.  GERD. 8.  Chronic kidney disease.   9.  Recent diagnosis of basal cell carcinoma of the right lower extremity, status post excision.  10.  Hysterectomy.  11.  Appendectomy.   SOCIAL HISTORY: The patient lives at home with her husband. No history of smoking, alcohol, or illicit drug use.   FAMILY HISTORY: No family history of coronary artery disease.   ALLERGIES: No known drug allergies.   HOME MEDICATIONS:  Please see medication reconciliation list for home medications.   REVIEW OF SYSTEMS:  CONSTITUTIONAL: The patient reports fever, chills, fatigue, weakness.  EYES: Denies blurry vision, double vision, inflammation, glaucoma.  ENT: Denies tinnitus, ear pain, hearing loss, epistaxis or discharge.  RESPIRATORY: Denies cough, wheezing, hemoptysis, shortness of breath, or chronic obstructive pulmonary disease.  CARDIOVASCULAR: Denies chest pain, edema, arrhythmia, palpitations, or syncope.  GASTROINTESTINAL: Denies nausea, vomiting, diarrhea, abdominal pain, or hematemesis.  GENITOURINARY: Denies dysuria, hematuria, or renal colic.  ENDOCRINE: Denies polyuria, polydipsia, or heat or cold intolerance.  HEMATOLOGY: Denies anemia, easy bruising, or bleeding diathesis.  INTEGUMENT: Reports erythema in the right abdominal wall and right chest area, around the drain site.  MUSCULOSKELETAL: Denies any gout, cramps, arthritis.  NEUROLOGIC: Denies any focal deficits, tingling, numbness, dysarthria, ataxia, headache, but reports a history of TIA in the past.  PSYCHIATRIC:  Denies anxiety, insomnia, or depression.   PHYSICAL EXAMINATION:  VITAL SIGNS: Temperature 100.6 temperature maximum; currently 99.1. Pulse 79, respiratory rate 16, blood pressure 128/145, saturating 94% on room air.  GENERAL: Well-nourished female who looks comfortable in the bed; in no  apparent distress.  HEENT: Head atraumatic, normocephalic. Pupils equal, reactive to light. Pink conjunctivae. Anicteric sclerae. Moist oral mucosa.  NECK: Supple. No  thyromegaly. No JVD.  CHEST: Good air entry bilaterally. No wheezing, rales, or rhonchi.  CARDIOVASCULAR: S1, S2 heard. No rubs, murmurs, gallops. Regular rate and rhythm. PMI nondisplaced.  ABDOMEN: Soft, nontender, nondistended. Bowel sounds present.  EXTREMITIES: No edema. No clubbing. No cyanosis. Has pedal pulses +2 bilaterally. Good capillary refill. Had a small bandage at the right anterior shin area from her recent excision of basal cell carcinoma; the site looked clean.  PSYCHIATRIC: Appropriate affect. Awake, alert x 3. Intact judgment and insight.  NEUROLOGIC: Cranial nerves grossly intact. Motor 5/5. No focal deficits.  MUSCULOSKELETAL: No joint effusion or erythema.  SKIN: The patient has erythema surrounding the drain site and beneath the chest excision site. The drain is draining sanguineous material in the drain container. The surgical incision looks clean with no dehiscence.   PERTINENT LABORATORY DATA: White blood cells 17, hemoglobin 9.6, hematocrit 29.7. Creatinine 1.48, sodium 133, lactate 1.1.   ASSESSMENT AND PLAN:  1.  Sepsis. This is due to cellulitic lesion around the surgical site. Blood cultures were sent. The patient will be started on broad-spectrum IV antibiotics. Will be kept on appropriate hydration. Given the cellulitis around the surgical site, we will consult the surgical service, Dr. Jamal Collin. Earlier discussed with Dr. Jamal Collin; at this point there is no recommendation for further imaging until he sees her in the morning. We will follow on the blood cultures. We will adjust antibiotics if needed.  2.  Diabetes. We will have the patient on insulin sliding scale. We will continue her on lower dose Lantus; we will hold oral hypoglycemic agents for the time being.  3.  Hypertension. Blood pressure is acceptable. Continue with home medications.  4.  Gastroesophageal reflux disease. Continue with proton pump inhibitor.  5.  Chronic kidney disease. Creatinine seems to be  around baseline. We will monitor closely. Continue with gentle hydration.  6.  History of congestive heart failure. The patient does not appear to be on volume overload right now. We will monitor closely.  7.  History of, transient ischemic attack. We will continue with aspirin. We will hold her Plavix for the time being, until she is seen by cardiology and intervention is ruled out.  8.  Deep vein thrombosis prophylaxis. Subcutaneous heparin.   CODE STATUS: The patient reports she is a Full Code.   TOTAL TIME SPENT ON ADMISSION AND PATIENT CARE: 55 minutes   ____________________________ Albertine Patricia, MD dse:MT D: 03/21/2014 01:14:52 ET T: 03/21/2014 05:39:55 ET JOB#: [id number]  cc: Albertine Patricia, MD, <Dictator>   Chantia Amalfitano Graciela Husbands MD ELECTRONICALLY SIGNED 03/23/2014 12:46

## 2014-12-01 NOTE — Discharge Summary (Signed)
Dates of Admission and Diagnosis:  Date of Admission 21-Mar-2014   Date of Discharge 21-Mar-2014   Admitting Diagnosis cellulitis   Final Diagnosis cellulitis right breast no abscess   Discharge Diagnosis 1 breast cancer   2 HTN   3 DM    Chief Complaint/History of Present Illness CHIEF COMPLAINT: Fever, chills, and erythema around recent right total mastectomy site.   HISTORY OF PRESENT ILLNESS: This is an 81 year old female with a history of diabetes, hypertension, transient ischemic attack, chronic kidney disease. The patient is with a recent diagnosis of breast cancer and underwent right total mastectomy with sentinel node biopsy on July 28. The patient presents with complaints of fever and chills for 1 day. As well, reports some erythema and tenderness around the surgical site and the right abdominal area. The patient had leukocytosis at 17,000, was febrile at 100.6. Her urine was negative. Denied any cough, any productive sputum, any dysuria, any polyuria, any shortness of breath, any hemoptysis, any leg swelling or calf tenderness.   The patient on the physical exam was noticed to have erythema of the right abdominal area. The patient had a drain around the surgical site which has been draining sanguineous material, which the patient reports has not changed in quantity or quality. The edges of the surgical scar were  clean and actually the erythema is lower, around the abdominal area and the drain site.    The patient had blood cultures sent. He was started on broad-spectrum IV antibiotics, and the ED physician contacted surgery and called Dr. Jamal Collin, who wanted the patient to be admitted, but at this point he does not  recommended any further imaging until he sees the patient in the morning.   Allergies:  No Known Allergies:     Routine UA:  12-Aug-15 06:45   Color (UA) Yellow  Clarity (UA) Clear  Glucose (UA) Negative  Bilirubin (UA) Negative  Ketones (UA) Negative   Specific Gravity (UA) 1.008  Blood (UA) Negative  pH (UA) 6.0  Protein (UA) Negative  Nitrite (UA) Negative  Leukocyte Esterase (UA) Negative (Result(s) reported on 21 Mar 2014 at 07:05AM.)  RBC (UA) 1 /HPF  WBC (UA) 3 /HPF  Bacteria (UA) NONE SEEN  Epithelial Cells (UA) 1 /HPF  Budding Yeast (UA) PRESENT (Result(s) reported on 21 Mar 2014 at 07:05AM.)   Pertinent Past History:  Pertinent Past History PAST MEDICAL HISTORY:  1.  Diabetes.  2.  Hypertension.  4.  Breast cancer, status post mastectomy of the right breast with sentinel node biopsy.  5.  Transient ischemic attack.  6.  Depression.  7.  GERD. 8.  Chronic kidney disease.  9.  Recent diagnosis of basal cell carcinoma of the right lower extremity, status post excision.   Hospital Course:  Hospital Course 1. Cellulitis of the breast with sepsis on admission resolved: Patient was placed on broad spectrum antibiotics with Zosyn and Vancomycin. However, on my examination and Dr. Jamal Collin there did not appear to be a great amount of erythema. There was no evidence of an absess. She will be discharged with PO antibiotics and follow up next week with Dr. Jamal Collin.  2. Breast Cancer: She will follow up with Oncology.  3. Hypertension: She will continue her outpatient medications 4. Diabetes: This was well controlled and she will continue on her outpatient medicatins and an ADA diet.   Condition on Discharge Stable   DISCHARGE INSTRUCTIONS HOME MEDS:  Medication Reconciliation: Patient's Home Medications at Discharge:  Medication Instructions  metoprolol succinate 100 mg oral tablet, extended release  1 tab(s) orally once a day for high blood pressure   omeprazole 20 mg oral delayed release capsule  1 cap(s) orally once a day   aspirin enteric coated 81 mg oral delayed release tablet  1 tab(s) orally once a day for heart protection   glipizide xl 10 mg oral tablet, extended release  1 tab(s) orally 2 times a day for  diabetes   hydralazine 25 mg oral tablet  1 tab(s) orally 3 times a day for high blood pressure   zyrtec 10 mg oral capsule  1 cap(s) orally once a day, As Needed for allergies   amlodipine 5 mg oral tablet  1 tab(s) orally once a day for blood pressure   plavix 75 mg oral tablet  1 tab(s) orally once a day for stroke prevention   levemir 100 units/ml subcutaneous solution  10 unit(s) subcutaneous once a day (at bedtime) for diabetes   furosemide 20 mg oral tablet  1 tab(s) orally 2 times a day for swelling and high blood pressure   quinapril 40 mg oral tablet  1 tab(s) orally once a day   pravastatin 80 mg oral tablet  1 tab(s) orally once a day (at bedtime)   gabapentin 100 mg oral capsule  1 cap(s) orally 3 times a day   multivitamin with minerals  1 dose(s)  once a day   fish oil 1000 mg oral capsule  1 cap(s) orally 2 times a day   bactrim ds 800 mg-160 mg oral tablet  1 tab(s) orally 2 times a day x 5 days     Physician's Instructions:  Home Health? No   Treatments None   Home Oxygen? No   Diet Low Sodium  Carbohydrate Controlled (ADA) Diet   Activity Limitations As tolerated   Referrals None   Return to Work Not Applicable    Time frame for Follow Up Appointment 2-4 weeks     Junie Panning G(Consultant): [facility], [address] [place], [place], [place] [address], [place]   Denton Lank (Sallie)(Family Physician): [facility], [place], [place], [place] [address], [place]  TIME SPENT:  Total Time: Greater than 30 minutes   Electronic Signatures: Bettey Costa (MD)  (Signed 12-Aug-15 16:16)  Authored: ADMISSION DATE AND DIAGNOSIS, CHIEF COMPLAINT/HPI, Allergies, PERTINENT LABS, PERTINENT PAST HISTORY, HOSPITAL COURSE, DISCHARGE INSTRUCTIONS HOME MEDS, PATIENT INSTRUCTIONS, Follow Up Physician, TIME SPENT   Last Updated: 12-Aug-15 16:16 by Bettey Costa (MD)

## 2014-12-01 NOTE — Op Note (Signed)
PATIENT NAME:  Kristen Barron, Kristen Barron MR#:  [id number] DATE OF BIRTH:  1928-12-27  DATE OF PROCEDURE:  03/06/2014  PREOPERATIVE DIAGNOSIS: Carcinoma of the right breast.   POSTOPERATIVE DIAGNOSIS: Carcinoma of the right breast.   OPERATION PERFORMED:  1.  Insertion of venous access port in the left internal jugular vein.  2.  Right total mastectomy with sentinel node biopsy.   SURGEON: Mckinley Jewel, M.D.   ANESTHESIA: General.   COMPLICATIONS: None.   ESTIMATED BLOOD LOSS: Approximately 75 mL.   DRAINS: A 19-gauge Jackson-Pratt drain.   DESCRIPTION OF PROCEDURE: The patient had preoperative nuclear contrast injection and she was brought to the operating room and was put to sleep in the supine position. The right breast subareolar area was infiltrated with 5 mL of diluted methylene blue for an additional identification of sentinel nodes.   Port-A-Cath insertion was performed first on the left side. The left upper chest and neck area were prepped and draped out as a sterile field. Ultrasound was used to localize the internal jugular vein and a small incision was made above the probe. A needle was successfully introduced into the internal jugular vein under ultrasound guidance with free withdrawal of blood. A guidewire was positioned and using Seldinger technique the catheter was positioned going towards the superior vena cava. The skin marking was at 18 cm. A subcutaneous pocket was then created over the 2nd costal cartilage area. A skin incision was made. A subcutaneous pocket was created with cautery. Catheter was tunneled through to this site, cut to approximate length and fixed to a prefilled port. The port was placed in the pocket and anchored to the underlying fascia with 3 stitches of 2-0 Prolene. It was then flushed through with 10 mL of heparinized saline. Fluoroscopy was used to ensure that the catheter was still in position going into the superior vena cava. The subcutaneous tissue was closed  with 3-0 Vicryl, the skin with subcuticular 4-0 Vicryl, covered with LiquiBand.   The drapes were then removed and the right breast and axilla were then prepped and draped out as a sterile field. The Gamma Finder was utilized and there was signal activity in a focal area in the medial portion of the axilla, just past the breast and a little bit more posteriorly.   An elliptical skin incision was mapped out and incision was made along the upper aspect of the breast in the axillary portion and the skin and subcutaneous tissue were then elevated until the pectoral fold was identified and from here using the Gamma Finder a single node measuring less than 1 cm was identified. It was blue in appearance and had intense signal activity. This was excised out and sent as a sentinel node.  There were no other palpable nodes in the axilla and no signal activity after removal of this one node. Frozen section subsequently showed no evidence of macrometastases.   The superior flap was then completed towards the infraclavicular region in the lower flap of the upper rectus fascia and the breast along with the pectoral fascia was dissected off the muscle from the medial to the lateral aspect and bleeding controlled with cautery and ligatures of 3-0 Vicryl, and a suture ligature of 3-0 Vicryl. After the lateral ends were freed, the lateral end was tagged with a nylon stitch and the breast was sent to pathology.   The wound was irrigated and after ensuring hemostasis, a 19-gauge Jackson-Pratt drain was positioned and brought out through a stab  incision inferiorly and fastened to the skin with a nylon stitch. Subcutaneous tissue was then approximated with 2-0 Vicryl stitches and the skin closed with 2 running sutures of 3-0 Monocryl and covered with LiquiBand. A padded dressing with Fluffs and a surgical bra were placed.  The patient tolerated the procedure well and she was subsequently returned to the recovery room in stable  condition.    ____________________________ S.Robinette Haines, MD sgs:lt D: 03/07/2014 08:30:00 ET T: 03/07/2014 10:13:29 ET JOB#: [id number]  cc: S.G. Jamal Collin, MD, <Dictator> [facility] Robinette Haines MD ELECTRONICALLY SIGNED 03/07/2014 13:17

## 2014-12-01 NOTE — H&P (Signed)
PATIENT NAME:  Kristen Barron, Kristen Barron MR#:  [id number] DATE OF BIRTH:  10-11-1928  DATE OF ADMISSION:  03/21/2014  ADDENDUM:  HOME MEDICATIONS: Aspirin 81 mg daily, quinapril 40 mg oral daily, Gabapentin 100 mg oral 3 times a day, glipizide 10 mg oral 2 times a day, Levemir 10 units subcutaneous at bedtime, Zyrtec 10 mg oral daily, pravastatin 80 mg at bedtime, Plavix 75 mg daily, metoprolol 100 mg daily, amlodipine 5 mg daily, Lasix 20 mg 2 times a day, multivitamin 1 tablet daily, Hydroxyzine 25 mg oral 3 times a day, omeprazole 20 mg daily.  ADDENDUM TO FAMILY HISTORY: Significant for CVA in her mother.  ADDENDUM TO ASSESSMENT AND PLAN:  Hyperlipidemia. Will continue with fish oil and statin.    ____________________________ Albertine Patricia, MD dse:MT D: 03/21/2014 01:25:07 ET T: 03/21/2014 05:10:49 ET JOB#: [id number]  cc: Albertine Patricia, MD, <Dictator> Kimra Kantor Graciela Husbands MD ELECTRONICALLY SIGNED 03/23/2014 12:46

## 2014-12-01 NOTE — H&P (Signed)
PATIENT NAME:  Kristen Barron, Kristen Barron MR#:  [id number] DATE OF BIRTH:  Jul 27, 1929  DATE OF ADMISSION:  05/13/2014  REFERRING PHYSICIAN: Sheryl L. Benjaman Lobe, MD  FAMILY PHYSICIAN: Denton Lank, MD  REASON FOR ADMISSION: Intractable nausea, vomiting.   HISTORY OF PRESENT ILLNESS: The patient is an 81 year old female with a history of breast cancer, on chemotherapy, followed by Dr. Oliva Bustard. Presents to the Emergency Room with intractable nausea, vomiting and fever where she was found to be leukopenic, anemic, with a UTI. She was unable to keep p.o.'s down and is now admitted for further evaluation.   PAST MEDICAL HISTORY:  1.  Breast cancer, on chemotherapy.  2.  Hyperlipidemia.  3.  Osteoarthritis.  4.  History of TIA.  5.  Chronic anemia.  6.  Chronic renal insufficiency.  7.  Degenerative disk disease.  8.  Type 2 diabetes mellitus.  9.  Anxiety/depression.  10.  GE reflux disease.  11.  Benign hypertension.  12.  Status post hysterectomy.  13.  Status post appendectomy.   MEDICATIONS:  1.  Zyrtec 10 mg p.o. daily.  2.  Quinapril 40 mg p.o. daily.  3.  Pravastatin 80 mg p.o. daily.  4.  Plavix 75 mg p.o. daily.  5.  Zofran 4 mg p.o. q. 6 hours p.r.n.  6.  Prilosec 20 mg p.o. daily.  7.  Toprol-XL 100 mg p.o. daily.  8.  Levemir 10 units subcutaneous at bedtime.  9.  Hydralazine 25 mg p.o. t.i.d.  10.  Glipizide XL 10 mg p.o. b.i.d.  11.  Gabapentin 100 mg p.o. t.i.d.  12.  Lasix 20 mg p.o. b.i.d.  13.  Aspirin 81 mg p.o. daily.  14.  Amlodipine 5 mg p.o. daily.   ALLERGIES: No known drug allergies.   SOCIAL HISTORY: Negative for alcohol or tobacco abuse.   FAMILY HISTORY: Positive for diabetes and hypertension.   REVIEW OF SYSTEMS:  CONSTITUTIONAL: The patient has had fever but no change in weight.  EYES: No blurred or double vision. No glaucoma.  EARS, NOSE AND THROAT: No tinnitus or hearing loss. No nasal discharge or bleeding. No difficulty swallowing.  RESPIRATORY: No  cough or wheezing. Denies hemoptysis.  CARDIOVASCULAR: No chest pain or palpitations. No syncope.  GASTROINTESTINAL: No diarrhea.  GENITOURINARY: No dysuria or hematuria.  ENDOCRINE: No polyuria or polydipsia. No heat or cold intolerance.  HEMATOLOGIC: The patient denies bruising or bleeding.  LYMPHATIC: No swollen glands.  MUSCULOSKELETAL: The patient does complain of back pain. No knee or hip pain. No gout.  NEUROLOGIC: No numbness or migraines. Denies seizures.  PSYCHIATRIC: The patient denies anxiety, insomnia or depression.   PHYSICAL EXAMINATION:  GENERAL: The patient is in no acute distress.  VITAL SIGNS: Currently remarkable for a blood pressure of 137/54, heart rate 88, respiratory rate of 20, temperature of 98.3, saturation 96% on room air.  HEENT: Normocephalic, atraumatic. Pupils equal, round and reactive to light and accommodation. Extraocular movements are intact. Sclerae not icteric. Conjunctivae are clear. Oropharynx is dry but clear.  NECK: Supple without JVD. No adenopathy or thyromegaly is noted.  LUNGS: Clear except for some basilar rhonchi. No wheezes or rales. No dullness. Respiratory effort is normal.  CARDIAC: Regular rate and rhythm. Normal S1, S2. No significant rubs or gallops. PMI is nondisplaced. Chest wall is nontender.  ABDOMEN: Soft, nontender with normoactive bowel sounds. No organomegaly or masses appreciated. No hernias or bruits were noted.  EXTREMITIES: Without clubbing, cyanosis or edema. Pulses were 2+ bilaterally.  SKIN: Warm and dry without rash or lesions.  NEUROLOGIC: Cranial nerves II-XII grossly intact. Deep tendon reflexes were symmetric. Motor and sensory examinations nonfocal.  PSYCHIATRIC: Revealed the patient is alert and oriented to person, place and time. She was cooperative and used good judgment.   LABORATORY DATA: Chest x-ray was unremarkable. Sugar was 183 with a BUN of 20, creatinine 1.48, GFR of 36. Troponin less than 0.02. White count  was 3.3 with a hemoglobin of 9.7. Urinalysis revealed 3+ leukocyte esterase with 1397 WBCs per high-power field and 3+ bacteria.   ASSESSMENT:  1.  Intractable nausea and vomiting.  2.  Dehydration.  3.  Urinary tract infection.  4.  Breast cancer, on chemotherapy.  5.  Leukopenia.  6.  Diabetes.   PLAN: The patient will be observed on the floor with IV fluids and IV antibiotics. Cultures have been sent. We will use Zofran as needed for nausea and vomiting. Clear liquid diet. We will follow her sugars. Follow up routine labs in the morning. Oncology consult in the morning. Further treatment and evaluation will depend upon the patient's progress.   TOTAL TIME SPENT: On this patient was 45 minutes.    ____________________________ Leonie Douglas Doy Hutching, MD jds:TT D: 05/13/2014 20:55:30 ET T: 05/13/2014 21:30:37 ET JOB#: [id number]  cc: Leonie Douglas. Doy Hutching, MD, <Dictator> JEFFREY Lennice Sites MD ELECTRONICALLY SIGNED 05/14/2014 21:41

## 2014-12-01 NOTE — Discharge Summary (Signed)
PATIENT NAME:  Kristen Barron, CARDON MR#:  [id number] DATE OF BIRTH:  1928/10/21  DATE OF ADMISSION:  05/13/2014 DATE OF DISCHARGE:  05/15/2014  ADMITTING DIAGNOSIS: Intractable nausea, vomiting.   DISCHARGE DIAGNOSES:  1.  Intractable cyclical, nausea, vomiting due to chemotherapy, resolving.  2.  Hyponatremia due to dehydration, resolved.  3.  Leukopenia due to chemotherapy.  4.  Iron deficiency as well as anemia of chronic disease, status post 1 unit of packed red blood cell transfusion during this admission.  5.  Urinary tract infection, Escherichia coli.  6.  Dysuria due to urinary tract infection.    MEDICATIONS: The patient was advised to resume all blood pressure medications whenever her oral intake is back to normal again and whenever she feels full rehydrated. She is to continue her medications which are metoprolol succinate 100 mg p.o. daily, aspirin 81 mg p.o. daily, glipizide XL 10 mg p.o. twice daily, Zyrtec 10 mg p.o. daily as needed, Plavix 75 mg p.o. daily, quinapril 40 mg p.o. daily, Pravachol 80 mg p.o. at bedtime, gabapentin 100 mg p.o. 3 times daily, fish oil 1 gram twice daily, Zofran 4 mg every 6 hours as needed, polyethylene glycol 17 grams once daily as needed, Claritin 10 mg p.o. daily, Levemir 10 units subcutaneously in the evening and 24 units subcutaneously at bedtime, multivitamins with minerals once daily, pantoprazole 40 mg p.o. daily, levofloxacin 250 mg p.o. every 24 hours for 5 more days. The patient is not to take omeprazole due to Plavix use. Also, do not take hydralazine, amlodipine or furosemide until she is fully rehydrated as mentioned above. Iron gluconate at 240 mg p.o. once daily dose.   DIET: Two gram salt, low fat, low cholesterol, mechanical soft.   ACTIVITY LIMITATIONS: As tolerated.    FOLLOWUP APPOINTMENT: With Dr. Denton Lank in 2 days after discharge, Dr. Oliva Bustard in 2 days after discharge.   CONSULTANTS: Care management, social work, Dr. Oliva Bustard.    RADIOLOGIC STUDIES: Chest PA and lateral 05/13/2014 revealed hypoinflation without acute cardiopulmonary disease. Repeated chest x-ray, portable single view, 05/14/2014 revealed slightly increased left basilar subsegmental atelectasis, otherwise, unchanged appearance of the chest.   HOSPITAL COURSE: The patient is an 81 year old Caucasian female with past medical history significant for history of breast carcinoma, on chemotherapy, who presented to the hospital with complaints of intractable nausea, vomiting as well as fevers. Please refer to Dr. Doy Hutching' admission note on 05/13/2014. On arrival to the hospital to the Emergency Room, the patient's temperature was 98.3, pulse was 88, respirations were 20, blood pressure 137/54, saturation was 96% on room air. Physical exam was unremarkable. The patient's lab data done on arrival revealed elevated glucose level 183, BUN and creatinine were 20 and 1.48, sodium 134, otherwise, BMP was unremarkable. The patient's troponin was less than 0.02. White blood cell count was low at 3.3, hemoglobin was 9.7, platelet count was 260, Absolute neutrophil count was 2.7. Coagulation panel was unremarkable. Blood culture taken on the 05/13/2014 showed no growth. Urinalysis was remarkable for 1397 white blood cells, 4 red blood cells, 3+ leukocytes esterase and she was positive for nitrites, 3+ bacteria was also noted. The patient's urine cultures revealed 100,000 colony-forming units of E. coli resistant to ampicillin, sensitive to all other antibiotics.   The patient was admitted to the hospital for further evaluation. She was initiated on Levaquin IV and she did progressively better. She was also restarted on clear liquid diet which was advanced to regular diet by the day of  discharge. She was rehydrated and her sodium level normalized and her creatinine improved and BUN normalized as well by the day of discharge 05/15/2014. With rehydration, however, she was noted to have  anemia with hemoglobin level of 7.3. She was deemed to be appropriate to be transfused with 1 unit of packed red blood cells, which will be done before her leaving to home on 05/15/2014.   On the day of discharge the patient is afebrile with temperature of 98.6, pulse was 79, respiration was 20, blood pressure 123/69, saturation was 98% on room air. The patient was advised to continue antibiotic therapy for 5 more days to complete course. She was also advised to restart current blood pressure medications and hold some of blood pressure medications until she is seen by primary care physician, Dr. Oliva Bustard, and whenever she feels fully rehydrated and her oral intake improves to baseline. She was also to follow her glucose levels very closely and cutdown her diabetes medications if her oral intake is not satisfactory. In regards to anemia she will be added iron supplementation and gluconate will be ordered for her upon discharge as well.   TIME SPENT: 40 minutes.    ____________________________ Theodoro Grist, MD rv:AT D: 05/15/2014 14:34:21 ET T: 05/16/2014 00:46:13 ET JOB#: [id number]  cc: Theodoro Grist, MD, <Dictator> Sarah "Sallie" Posey Pronto, MD Martie Lee. Oliva Bustard, MD Theodoro Grist MD ELECTRONICALLY SIGNED 05/18/2014 12:32

## 2014-12-03 LAB — CULTURE, BLOOD (SINGLE)

## 2014-12-04 LAB — CULTURE, BLOOD (SINGLE)

## 2014-12-07 ENCOUNTER — Other Ambulatory Visit: Payer: Self-pay | Admitting: *Deleted

## 2014-12-07 DIAGNOSIS — C50911 Malignant neoplasm of unspecified site of right female breast: Secondary | ICD-10-CM

## 2014-12-10 ENCOUNTER — Other Ambulatory Visit: Payer: Self-pay | Admitting: *Deleted

## 2014-12-11 ENCOUNTER — Other Ambulatory Visit: Payer: Self-pay | Admitting: Family Medicine

## 2014-12-11 DIAGNOSIS — N390 Urinary tract infection, site not specified: Secondary | ICD-10-CM

## 2014-12-13 ENCOUNTER — Inpatient Hospital Stay (HOSPITAL_BASED_OUTPATIENT_CLINIC_OR_DEPARTMENT_OTHER): Payer: Medicare Other | Admitting: Oncology

## 2014-12-13 ENCOUNTER — Inpatient Hospital Stay: Payer: Medicare Other | Attending: Internal Medicine

## 2014-12-13 VITALS — BP 148/72 | HR 70 | Temp 94.8°F | Resp 18 | Ht 61.0 in | Wt 190.0 lb

## 2014-12-13 DIAGNOSIS — D631 Anemia in chronic kidney disease: Secondary | ICD-10-CM | POA: Insufficient documentation

## 2014-12-13 DIAGNOSIS — I129 Hypertensive chronic kidney disease with stage 1 through stage 4 chronic kidney disease, or unspecified chronic kidney disease: Secondary | ICD-10-CM

## 2014-12-13 DIAGNOSIS — Z7982 Long term (current) use of aspirin: Secondary | ICD-10-CM | POA: Diagnosis not present

## 2014-12-13 DIAGNOSIS — Z79899 Other long term (current) drug therapy: Secondary | ICD-10-CM | POA: Insufficient documentation

## 2014-12-13 DIAGNOSIS — N390 Urinary tract infection, site not specified: Secondary | ICD-10-CM

## 2014-12-13 DIAGNOSIS — M1990 Unspecified osteoarthritis, unspecified site: Secondary | ICD-10-CM | POA: Insufficient documentation

## 2014-12-13 DIAGNOSIS — N189 Chronic kidney disease, unspecified: Secondary | ICD-10-CM | POA: Diagnosis not present

## 2014-12-13 DIAGNOSIS — Z170 Estrogen receptor positive status [ER+]: Secondary | ICD-10-CM | POA: Diagnosis not present

## 2014-12-13 DIAGNOSIS — D509 Iron deficiency anemia, unspecified: Secondary | ICD-10-CM | POA: Insufficient documentation

## 2014-12-13 DIAGNOSIS — Z7901 Long term (current) use of anticoagulants: Secondary | ICD-10-CM

## 2014-12-13 DIAGNOSIS — Z9011 Acquired absence of right breast and nipple: Secondary | ICD-10-CM

## 2014-12-13 DIAGNOSIS — Z9221 Personal history of antineoplastic chemotherapy: Secondary | ICD-10-CM

## 2014-12-13 DIAGNOSIS — Z79811 Long term (current) use of aromatase inhibitors: Secondary | ICD-10-CM | POA: Diagnosis not present

## 2014-12-13 DIAGNOSIS — R600 Localized edema: Secondary | ICD-10-CM | POA: Diagnosis not present

## 2014-12-13 DIAGNOSIS — E119 Type 2 diabetes mellitus without complications: Secondary | ICD-10-CM | POA: Diagnosis not present

## 2014-12-13 DIAGNOSIS — Z86711 Personal history of pulmonary embolism: Secondary | ICD-10-CM | POA: Diagnosis not present

## 2014-12-13 DIAGNOSIS — Z803 Family history of malignant neoplasm of breast: Secondary | ICD-10-CM

## 2014-12-13 DIAGNOSIS — C50011 Malignant neoplasm of nipple and areola, right female breast: Secondary | ICD-10-CM | POA: Diagnosis not present

## 2014-12-13 DIAGNOSIS — Z171 Estrogen receptor negative status [ER-]: Secondary | ICD-10-CM | POA: Insufficient documentation

## 2014-12-13 DIAGNOSIS — C50911 Malignant neoplasm of unspecified site of right female breast: Secondary | ICD-10-CM | POA: Insufficient documentation

## 2014-12-13 LAB — CBC WITH DIFFERENTIAL/PLATELET
BASOS ABS: 0.1 10*3/uL (ref 0–0.1)
Basophils Relative: 2 %
Eosinophils Absolute: 0.2 10*3/uL (ref 0–0.7)
Eosinophils Relative: 5 %
HCT: 31.9 % — ABNORMAL LOW (ref 35.0–47.0)
Hemoglobin: 10.2 g/dL — ABNORMAL LOW (ref 12.0–16.0)
LYMPHS ABS: 1.1 10*3/uL (ref 1.0–3.6)
LYMPHS PCT: 22 %
MCH: 26.7 pg (ref 26.0–34.0)
MCHC: 32.1 g/dL (ref 32.0–36.0)
MCV: 83.3 fL (ref 80.0–100.0)
Monocytes Absolute: 0.4 10*3/uL (ref 0.2–0.9)
Monocytes Relative: 7 %
Neutro Abs: 3.3 10*3/uL (ref 1.4–6.5)
Neutrophils Relative %: 64 %
PLATELETS: 244 10*3/uL (ref 150–440)
RBC: 3.82 MIL/uL (ref 3.80–5.20)
RDW: 16.6 % — AB (ref 11.5–14.5)
WBC: 5.1 10*3/uL (ref 3.6–11.0)

## 2014-12-13 LAB — URINALYSIS COMPLETE WITH MICROSCOPIC (ARMC ONLY)
BACTERIA UA: NONE SEEN
BILIRUBIN URINE: NEGATIVE
GLUCOSE, UA: 50 mg/dL — AB
Ketones, ur: NEGATIVE mg/dL
Leukocytes, UA: NEGATIVE
NITRITE: NEGATIVE
Protein, ur: NEGATIVE mg/dL
Specific Gravity, Urine: 1.008 (ref 1.005–1.030)
pH: 5 (ref 5.0–8.0)

## 2014-12-13 LAB — COMPREHENSIVE METABOLIC PANEL
ALBUMIN: 3.4 g/dL — AB (ref 3.5–5.0)
ALK PHOS: 89 U/L (ref 38–126)
ALT: 10 U/L — ABNORMAL LOW (ref 14–54)
AST: 19 U/L (ref 15–41)
Anion gap: 7 (ref 5–15)
BUN: 29 mg/dL — ABNORMAL HIGH (ref 6–20)
CALCIUM: 8.2 mg/dL — AB (ref 8.9–10.3)
CO2: 26 mmol/L (ref 22–32)
Chloride: 98 mmol/L — ABNORMAL LOW (ref 101–111)
Creatinine, Ser: 1.36 mg/dL — ABNORMAL HIGH (ref 0.44–1.00)
GFR calc Af Amer: 40 mL/min — ABNORMAL LOW (ref >60)
GFR calc non Af Amer: 35 mL/min — ABNORMAL LOW (ref >60)
Glucose, Bld: 312 mg/dL — ABNORMAL HIGH (ref 65–99)
Potassium: 4.2 mmol/L (ref 3.5–5.1)
SODIUM: 131 mmol/L — AB (ref 135–145)
TOTAL PROTEIN: 7.1 g/dL (ref 6.5–8.1)
Total Bilirubin: 0.4 mg/dL (ref 0.3–1.2)

## 2014-12-15 ENCOUNTER — Emergency Department
Admission: EM | Admit: 2014-12-15 | Discharge: 2014-12-15 | Discharge status: Against Medical Advice | Payer: Medicare Other

## 2014-12-16 ENCOUNTER — Encounter: Payer: Self-pay | Admitting: Oncology

## 2014-12-16 NOTE — Progress Notes (Signed)
Note Type Oncology Follow Up   HPI: This 81 year old Female patient presents to the clinic for follow up  Breast Cancer (1)  Chief Complaint: 81 year old lady with a history of carcinoma of breast was in emergency room with stinging burning pain.  Patient is also on [place] with the previous history of pulmonary embolism.  Patient is here burning and stinging pain is improved she was started on Levaquin.  Urine culture and blood cultures were positive for Escherichia coli.          Subjective: Chief Complaint/Diagnosis:   1. Carcinoma of the right breast T1c N0 M0 tumor based on ultrasound and the breast mammogram. ER negative, PR negative, HER-2/neu positive (diagnosis in July of 2015). 2. Iron deficiency anemia with chronic renal disease. 3. Status post mastectomy (right) in August of 2015. Patient has stage IC, ER negative, PR negative, HER-2 receptor positive.  4. Patient started on chemotherapy with Taxol and Herceptin on April 09, 2014. 5.because of progressive side effect Taxol was discontinued (August 28, 2014) Herceptin would be continued to July of 2016 6.  Because of swelling of lower extremity and shortness of breath Herceptin was discontinued in March of 2016 patient would continue letrozole HPI:   81 year old lady with a history of carcinoma of breast stage IC which is estrogen receptor progesterone receptor negative HER-2/neu receptor positive.  Was getting Taxol and Herceptin chemotherapy Because of progressive shortness of breath and swelling of lower extremity Herceptin was discontinued.  Patient reported to emergency room with fever of 101.  Urine was abnormal patient was started on Levaquin.  Chest x-ray was within normal range.  Patient is afebrile today but feeling weak and tired   Review of Systems:  General: fever  weakness  fatigue  Performance Status (ECOG): 1  HEENT: no complaints  Lungs: no complaints  Cardiac: no complaints  GI: no complaints  GU: no  complaints  Musculoskeletal: no complaints  Extremities: swelling  2+  Skin: no complaints  Neuro: no complaints  Endocrine: no complaints  Psych: anxiety  Pain ?: No complaints (0, none)  Review of Systems: All other systems were reviewed and found to be negative   Allergies:  No Known Allergies:   Significant History/PMH:   Breast cancer:    Hyperlipidemia:    Arthritis:    TIA - Transient Ischemic Attack:    Anemia:    Renal Insufficiency:    Degenerative Disc Disease:    Hypoglycemia:    Depression:    GERD - Esophageal Reflux:    Hypercholesterolemia:    CHF:    HTN:    Diabetes Mellitus, Type II (NIDD):    Back Surgery:    Appendectomy:    Hysterectomy - Partial:   Preventive Screening:  Has patient had any of the following test? Colonscopy  Mammography (1)   Last Colonoscopy: About 3 to 4 years ago.Marland Kitchen.(1)   Last Mammography: July 2015(1)   Smoking History: Smoking History Quit about 30+ years ago.Marland Kitchen.(1)  PFSH: Family History: positive  Comments: AUNT AND SISTER BREAST CANCER AGE 21'S, BROTHER KIDNEY CANCER, SISTER PANCREATIC CANCER AGE 66'S  Social History: negative alcohol  Comments: FORMER SMOKER  Additional Past Medical and Surgical History: CHRONIC HYPONATREMIA, DM, OA, HTN, SECONDARY HYPERPARATHYROID, AND PRIOR EPISODE RHABDOMYALISIS FRO SIMVASTATIN, SLEEP APNEA, HYSTERECTOMY, RECENT TIA LEFT SIDED WEAKNESS  COLONOSCOPY APPROX 4 YRS AGO   Home Medications: Medication Instructions Last Modified Date/Time  levofloxacin 750 mg oral tablet 1 tab(s) orally every  24 hours 25-Apr-16 09:26  erythromycin 0.5% ophthalmic ointment 1 application to each affected eye 3 times a day 21-Apr-16 15:20  furosemide 40 mg oral tablet 1 tab(s) orally once a day 21-Apr-16 15:20  ferrous gluconate 240 mg oral tablet 1 tab(s) orally once a day 21-Apr-16 15:20  pantoprazole 40 mg oral delayed release tablet 1 tab(s) orally once a day 21-Apr-16 15:20  Claritin  10 mg oral tablet 1 tab(s) orally once a day 21-Apr-16 15:20  polyethylene glycol 3350 - oral powder for reconstitution 17 gram(s) orally once a day 21-Apr-16 15:20  ondansetron 4 mg oral tablet 1 tab(s) orally every 6 hours, As Needed - for Nausea, Vomiting 21-Apr-16 15:20  metoprolol succinate 100 mg oral tablet, extended release 1 tab(s) orally once a day for high blood pressure 21-Apr-16 15:20  GlipiZIDE XL 10 mg oral tablet, extended release 1 tab(s) orally 2 times a day for diabetes 21-Apr-16 15:20  ZyrTEC 10 mg oral capsule 1 cap(s) orally once a day, As Needed for allergies 21-Apr-16 15:20  pravastatin 80 mg oral tablet 1 tab(s) orally once a day (at bedtime) 21-Apr-16 15:20  Fish Oil 1000 mg oral capsule 1 cap(s) orally 2 times a day 21-Apr-16 15:20  Levemir 100 units/mL subcutaneous solution 10 unit(s) subcutaneous once a day (in the evening) with meal and 24 units subcutaneous once a day (at bedtime) 21-Apr-16 15:20  multivitamin with minerals 1 tab(s) orally once a day 21-Apr-16 15:20  gabapentin 100 mg oral capsule  orally twice a day 21-Apr-16 15:20  Klor-Con M20 20 mEq oral tablet, extended release  orally once a day 12-Apr-16 10:31  Xarelto 15 mg oral tablet 1 tab(s) orally once a day (in the evening) 21-Apr-16 15:20  hydrALAZINE 25 mg oral tablet  orally 3 times a day 21-Apr-16 15:20  amLODIPine 5 mg oral tablet 1 tab(s) orally once a day 21-Apr-16 15:20  traZODone 50 mg oral tablet 1 tab(s) orally 3 times a day 21-Apr-16 15:20  Benzanate 100 mg once a day  21-Apr-16 15:20  NovoLOG 100 units/mL subcutaneous solution  subcutaneous once a day in the AM 21-Apr-16 15:20   Vital Signs:  :: Ht(CM): 155 Wt(KG): 84.4 BSA: 1.8 Temp: 96.6 Pulse: 102 RR: 18  BP: 156/79   Physical Exam:  General: patient is alert oriented not in any acute distress  Eyes: pupils  are equal reacting to light.    No jaundice  palelooking sclera  Head, Ears, Nose,Throat: normal, no lesions or deformities   Neck, Thyroid: no thyroid tenderness, enlargement or nodule.  neck supple without massess or tenderness. no adenopathy. no carotid bruit.  Respiratory: no rales, rhonchi, retractions or wheezing  Cardiovascular: heart sounds are normal.oft systolic murmur.  Occasionally irregular heart some  Gastrointestinal: abdomen is soft.  Liver and spleen not palpable.  No ascites.  No tenderness.  Bowel sounds are normal  Musculoskeletal: no swelling  of the joint.  Lower extremity no swelling  Skin: swelling of both lower extremity 2+  Neurological: higher functions are within normal limit.  cranial nerves are intact  Muscle power tone within normal limit.  No focal signs.  No sensory deficit.  Lymphatics: no cervical, axillary, or inguinal lymphadenopathy   Lab Results Review:  Lab Results      Radiology Results: XRay:    20-Apr-16 16:32, Chest PA and Lateral  Chest PA and Lateral   REASON FOR EXAM:    fever, eval for PNA  COMMENTS:       PROCEDURE: DXR -  DXR CHEST PA (OR AP) AND LATERAL  - Nov 28 2014  4:32PM     CLINICAL DATA:  Cough, congestion for 2 months. Fever, weakness for  4 days. History of right breast cancer and right mastectomy.    EXAM:  CHEST  2 VIEW    COMPARISON:  None.    FINDINGS:  Left Port-A-Cath remains in place, unchanged. Left basilar density  likely reflects scarring, stable. Heart is normal size. No  effusions. No focal opacity on the right. No acute bony abnormality.     IMPRESSION:  No active cardiopulmonary disease.      Electronically Signed    By: Rolm Baptise M.D.    On: 11/28/2014 16:43         Verified By: Raelyn Number, M.D.,  LabUnknown:  PACS Image    Assessment and Plan: Impression:   1. Carcinoma of breast.(status post right breast mastectomy) Herceptin was discontinued. Patient had fever Was started on Levaquin Urine culture and blood culture was positive for Escherichia coli Is sensitive to Levaquin and will continue  Levaquin at present time for total 10 days Patient was instructed to call us if  fever continues  Plan: continue Herceptin and letrozole.  That is no clinical evidence of recurrent disease  ..........  Fax to Physician:  Physicians To Recieve Fax: Vicie Mutters), MD - [phone].  Advance Directive:  Advance Directive ([place]) yes(1)

## 2014-12-22 LAB — URINE CULTURE

## 2014-12-28 ENCOUNTER — Other Ambulatory Visit: Payer: Self-pay | Admitting: *Deleted

## 2014-12-28 DIAGNOSIS — C50911 Malignant neoplasm of unspecified site of right female breast: Secondary | ICD-10-CM

## 2014-12-31 ENCOUNTER — Inpatient Hospital Stay (HOSPITAL_BASED_OUTPATIENT_CLINIC_OR_DEPARTMENT_OTHER): Payer: Medicare Other | Admitting: Oncology

## 2014-12-31 ENCOUNTER — Inpatient Hospital Stay: Payer: Medicare Other

## 2014-12-31 VITALS — BP 173/72 | HR 65 | Temp 97.0°F | Wt 195.3 lb

## 2014-12-31 DIAGNOSIS — D509 Iron deficiency anemia, unspecified: Secondary | ICD-10-CM

## 2014-12-31 DIAGNOSIS — Z9011 Acquired absence of right breast and nipple: Secondary | ICD-10-CM | POA: Diagnosis not present

## 2014-12-31 DIAGNOSIS — C50911 Malignant neoplasm of unspecified site of right female breast: Secondary | ICD-10-CM

## 2014-12-31 DIAGNOSIS — Z9221 Personal history of antineoplastic chemotherapy: Secondary | ICD-10-CM

## 2014-12-31 DIAGNOSIS — Z171 Estrogen receptor negative status [ER-]: Secondary | ICD-10-CM | POA: Diagnosis not present

## 2014-12-31 DIAGNOSIS — M1990 Unspecified osteoarthritis, unspecified site: Secondary | ICD-10-CM

## 2014-12-31 DIAGNOSIS — R600 Localized edema: Secondary | ICD-10-CM

## 2014-12-31 DIAGNOSIS — E119 Type 2 diabetes mellitus without complications: Secondary | ICD-10-CM

## 2014-12-31 DIAGNOSIS — N189 Chronic kidney disease, unspecified: Secondary | ICD-10-CM

## 2014-12-31 DIAGNOSIS — Z7982 Long term (current) use of aspirin: Secondary | ICD-10-CM

## 2014-12-31 DIAGNOSIS — Z79899 Other long term (current) drug therapy: Secondary | ICD-10-CM

## 2014-12-31 DIAGNOSIS — I129 Hypertensive chronic kidney disease with stage 1 through stage 4 chronic kidney disease, or unspecified chronic kidney disease: Secondary | ICD-10-CM

## 2014-12-31 LAB — COMPREHENSIVE METABOLIC PANEL
ALT: 11 U/L — AB (ref 14–54)
ANION GAP: 7 (ref 5–15)
AST: 20 U/L (ref 15–41)
Albumin: 3.5 g/dL (ref 3.5–5.0)
Alkaline Phosphatase: 84 U/L (ref 38–126)
BILIRUBIN TOTAL: 0.4 mg/dL (ref 0.3–1.2)
BUN: 19 mg/dL (ref 6–20)
CO2: 26 mmol/L (ref 22–32)
Calcium: 8.1 mg/dL — ABNORMAL LOW (ref 8.9–10.3)
Chloride: 94 mmol/L — ABNORMAL LOW (ref 101–111)
Creatinine, Ser: 1.29 mg/dL — ABNORMAL HIGH (ref 0.44–1.00)
GFR calc non Af Amer: 37 mL/min — ABNORMAL LOW (ref >60)
GFR, EST AFRICAN AMERICAN: 43 mL/min — AB (ref >60)
Glucose, Bld: 179 mg/dL — ABNORMAL HIGH (ref 65–99)
Potassium: 4.3 mmol/L (ref 3.5–5.1)
Sodium: 127 mmol/L — ABNORMAL LOW (ref 135–145)
Total Protein: 7.3 g/dL (ref 6.5–8.1)

## 2014-12-31 LAB — CBC WITH DIFFERENTIAL/PLATELET
Basophils Absolute: 0.1 10*3/uL (ref 0–0.1)
Basophils Relative: 1 %
EOS ABS: 0.2 10*3/uL (ref 0–0.7)
Eosinophils Relative: 3 %
HCT: 31.1 % — ABNORMAL LOW (ref 35.0–47.0)
HEMOGLOBIN: 9.9 g/dL — AB (ref 12.0–16.0)
LYMPHS PCT: 23 %
Lymphs Abs: 1.6 10*3/uL (ref 1.0–3.6)
MCH: 26.7 pg (ref 26.0–34.0)
MCHC: 32 g/dL (ref 32.0–36.0)
MCV: 83.4 fL (ref 80.0–100.0)
MONOS PCT: 10 %
Monocytes Absolute: 0.7 10*3/uL (ref 0.2–0.9)
NEUTROS PCT: 63 %
Neutro Abs: 4.3 10*3/uL (ref 1.4–6.5)
PLATELETS: 259 10*3/uL (ref 150–440)
RBC: 3.73 MIL/uL — ABNORMAL LOW (ref 3.80–5.20)
RDW: 17.2 % — AB (ref 11.5–14.5)
WBC: 6.8 10*3/uL (ref 3.6–11.0)

## 2014-12-31 MED ORDER — SODIUM CHLORIDE 0.9 % IJ SOLN
10.0000 mL | INTRAMUSCULAR | Status: DC | PRN
  Administered 2014-12-31: 10 mL via INTRAVENOUS
  Filled 2014-12-31: qty 10

## 2014-12-31 MED ORDER — HEPARIN SOD (PORK) LOCK FLUSH 100 UNIT/ML IV SOLN
500.0000 [IU] | Freq: Once | INTRAVENOUS | Status: AC
  Administered 2014-12-31: 500 [IU] via INTRAVENOUS

## 2015-01-05 ENCOUNTER — Encounter: Payer: Self-pay | Admitting: Oncology

## 2015-01-05 NOTE — Progress Notes (Signed)
[place] @ [facility] [phone]  Fax:([phone]) [place]: 12-07-1928  MR#: [id number]  CNO#:[id number]  Patient Care Team: Denton Lank, MD as PCP - General (Family Medicine) Forest Gleason, MD (Unknown Physician Specialty) Christene Lye, MD (General Surgery)  CHIEF COMPLAINT:  Chief Complaint  Patient presents with  . Follow-up    Oncology History   1. Carcinoma of the right breast T1c N0 M0 tumor based on ultrasound and the breast mammogram. ER negative, PR negative, HER-2/neu positive (diagnosis in July of 2015). 2. Iron deficiency anemia with chronic renal disease. 3. Status post mastectomy (right) in August of 2015. Patient has stage IC, ER negative, PR negative, HER-2 receptor positive.  4. Patient started on chemotherapy with Taxol and Herceptin on April 09, 2014. 5.because of progressive side effect Taxol was discontinued (August 28, 2014) Herceptin would be continued to July of 2016 6.  Because of swelling of lower extremity and shortness of breath Herceptin was discontinued in March of 2016 patient would continue      Breast cancer, right, invasive ductal, triple negative   02/28/2014 Initial Diagnosis Breast cancer, right, invasive ductal, triple negative    Oncology Flowsheet 07/20/2013 07/24/2013 09/11/2013  dexamethasone (DECADRON) IJ - - -  diazepam (VALIUM) PO 2 mg - -  ondansetron (ZOFRAN) IV 4 mg - 4 mg    INTERVAL HISTORY: 81 year old lady with history of carcinoma of breast stage IC ER negative, PR negative, HER-2/neu positive tumor.  Patient is here for ongoing evaluation and treatment consideration.  No chills.  No fever.  Appetite is improving.  Swelling of lower extremity is improving.  Shortness of breath is improved.  REVIEW OF SYSTEMS:   Gen. status:  shortness of breath is improved.  Patient's performance status is improving. HEENT: No headache.  No hearing loss.  No ear pain.  No nosebleed or congestion.  No sore  throat.  No difficulty swallowing Lungs: Shortness of breath is improved. Cardiac: No chest pain.  No paroxysmal nocturnal dyspnea Skin: No evidence of ecchymosis or rash.. Lower extremity swelling and redness is improved Neurological system: No dizziness.  No tingling.  His was.  no tingling numbness.  no focal weakness or any focal signs. Gastro intestinal system: No heartburn.  No nausea or vomiting.  No abdominal pain.  No diarrhea.  No constipation.  No rectal bleeding.   As per HPI. Otherwise, a complete review of systems is negatve.  PAST MEDICAL HISTORY: Past Medical History  Diagnosis Date  . Hypertension   . CHF (congestive heart failure)   . Stroke     August 2014, but has had strokes prior as well  . Cancer     Reports lumpectomy for a cyst  . Renal disorder   . Diabetes   . Hemorrhoids   . Arthritis     PAST SURGICAL HISTORY: Past Surgical History  Procedure Laterality Date  . Abdominal hysterectomy      Patient not clear as to why  . Back surgery    . Hand surgery Right     Carpel tunnel release in the 1970s  . Lumbar laminectomy/decompression microdiscectomy Bilateral 07/24/2013    Procedure: LUMBAR LAMINECTOMY/DECOMPRESSION MICRODISCECTOMY LUMBAR THREE-FOUR;  Surgeon: Charlie Pitter, MD;  Location: [place] [facility];  Service: Neurosurgery;  Laterality: Bilateral;  . Breast biopsy Right February 15 2014    invasive mammary carcinoma/ER/PR negative, HER 2 positive  . Breast surgery Right 03/06/14    mastectomy  FAMILY HISTORY Family History  Problem Relation Age of Onset  . Cancer Sister 76    breast  . Cancer Brother     kidney  . Cancer Maternal Aunt     breast  . Cancer Other     maternal niece with breast cancer  . Cancer Sister     pancreatic    GYNECOLOGIC HISTORY:  No LMP recorded. Patient has had a hysterectomy.     ADVANCED DIRECTIVES: Patient does have advanced health care directive   HEALTH MAINTENANCE: History  Substance Use Topics    . Smoking status: Never Smoker   . Smokeless tobacco: Never Used  . Alcohol Use: No      No Known Allergies  Current Outpatient Prescriptions  Medication Sig Dispense Refill  . amLODipine (NORVASC) 5 MG tablet Take 5 mg by mouth daily.    . Calcium-Magnesium-Vitamin D (CALCIUM 500 PO) Take 500 mg by mouth daily.    . clopidogrel (PLAVIX) 75 MG tablet Take 75 mg by mouth daily with breakfast.    . docusate sodium 100 MG CAPS Take 100 mg by mouth daily. 30 capsule 0  . doxycycline (VIBRAMYCIN) 100 MG capsule Take 1 capsule (100 mg total) by mouth daily. 7 capsule 0  . furosemide (LASIX) 20 MG tablet Take 20 mg by mouth 2 (two) times daily.    Marland Kitchen gabapentin (NEURONTIN) 300 MG capsule Take 300 mg by mouth 2 (two) times daily.     Marland Kitchen glipiZIDE (GLUCOTROL XL) 10 MG 24 hr tablet Take 10 mg by mouth 2 (two) times daily.    . hydrALAZINE (APRESOLINE) 25 MG tablet Take 25 mg by mouth 3 (three) times daily.    Marland Kitchen HYDROcodone-acetaminophen (NORCO/VICODIN) 5-325 MG per tablet Take 1 tablet by mouth every 6 (six) hours as needed.     . metoprolol succinate (TOPROL-XL) 100 MG 24 hr tablet Take 100 mg by mouth daily. Take with or immediately following a meal.    . omega-3 acid ethyl esters (LOVAZA) 1 G capsule Take 1 g by mouth 2 (two) times daily.     Marland Kitchen omeprazole (PRILOSEC) 20 MG capsule Take 20 mg by mouth daily.     . ondansetron (ZOFRAN ODT) 4 MG disintegrating tablet Take 1 tablet (4 mg total) by mouth every 8 (eight) hours as needed for nausea or vomiting. 10 tablet 0  . oxybutynin (DITROPAN) 5 MG tablet Take 10 mg by mouth every evening. Takes 55m in the morning and 13min the evening    . pantoprazole (PROTONIX) 40 MG tablet Take 1 tablet (40 mg total) by mouth daily. 30 tablet 3  . polyethylene glycol powder (GLYCOLAX/MIRALAX) powder Take by mouth as needed.     . pravastatin (PRAVACHOL) 80 MG tablet Take 80 mg by mouth every evening.     . quinapril (ACCUPRIL) 40 MG tablet Take 40 mg by mouth  at bedtime.    . Marland Kitchenspirin EC 81 MG tablet Take 81 mg by mouth daily.     No current facility-administered medications for this visit.    OBJECTIVE:  Filed Vitals:   12/31/14 1606  BP: 173/72  Pulse: 65  Temp: 97 F (36.1 C)     Body mass index is 36.93 kg/(m^2).    ECOG FS:1 - Symptomatic but completely ambulatory  PHYSICAL EXAM: GENERAL status: Performance status is good.  Patient has not lost significant weight HEENT: No evidence of stomatitis. Sclera and conjunctivae :: No jaundice.   pale looking. Lungs: Air  entry equal on both sides.  No rhonchi.  No rales.  Cardiac: Heart sounds are normal.  No pericardial rub.  No murmur. Lymphatic system: Cervical, axillary, inguinal, lymph nodes not palpable GI: Abdomen is soft.  No ascites.  Liver spleen not palpable.  No tenderness.  Bowel sounds are within normal limit Lower extremity: No edema Neurological system: Higher functions, cranial nerves intact no evidence of peripheral neuropathy. Skin: No rash.  No ecchymosis.Marland Kitchen t  right breast mastectomy no evidence of recurrent disease  LEFT breast: No palpable masses.  Axillary lymph nodes clear  LAB RESULTS:  Infusion on 12/31/2014  Component Date Value Ref Range Status  . WBC 12/31/2014 6.8  3.6 - 11.0 K/uL Final  . RBC 12/31/2014 3.73* 3.80 - 5.20 MIL/uL Final  . Hemoglobin 12/31/2014 9.9* 12.0 - 16.0 g/dL Final  . HCT 12/31/2014 31.1* 35.0 - 47.0 % Final  . MCV 12/31/2014 83.4  80.0 - 100.0 fL Final  . MCH 12/31/2014 26.7  26.0 - 34.0 pg Final  . MCHC 12/31/2014 32.0  32.0 - 36.0 g/dL Final  . RDW 12/31/2014 17.2* 11.5 - 14.5 % Final  . Platelets 12/31/2014 259  150 - 440 K/uL Final  . Neutrophils Relative % 12/31/2014 63   Final  . Neutro Abs 12/31/2014 4.3  1.4 - 6.5 K/uL Final  . Lymphocytes Relative 12/31/2014 23   Final  . Lymphs Abs 12/31/2014 1.6  1.0 - 3.6 K/uL Final  . Monocytes Relative 12/31/2014 10   Final  . Monocytes Absolute 12/31/2014 0.7  0.2 - 0.9 K/uL  Final  . Eosinophils Relative 12/31/2014 3   Final  . Eosinophils Absolute 12/31/2014 0.2  0 - 0.7 K/uL Final  . Basophils Relative 12/31/2014 1   Final  . Basophils Absolute 12/31/2014 0.1  0 - 0.1 K/uL Final  . Sodium 12/31/2014 127* 135 - 145 mmol/L Final   A-LINE DRAW  . Potassium 12/31/2014 4.3  3.5 - 5.1 mmol/L Final  . Chloride 12/31/2014 94* 101 - 111 mmol/L Final  . CO2 12/31/2014 26  22 - 32 mmol/L Final  . Glucose, Bld 12/31/2014 179* 65 - 99 mg/dL Final  . BUN 12/31/2014 19  6 - 20 mg/dL Final  . Creatinine, Ser 12/31/2014 1.29* 0.44 - 1.00 mg/dL Final  . Calcium 12/31/2014 8.1* 8.9 - 10.3 mg/dL Final  . Total Protein 12/31/2014 7.3  6.5 - 8.1 g/dL Final  . Albumin 12/31/2014 3.5  3.5 - 5.0 g/dL Final  . AST 12/31/2014 20  15 - 41 U/L Final  . ALT 12/31/2014 11* 14 - 54 U/L Final  . Alkaline Phosphatase 12/31/2014 84  38 - 126 U/L Final  . Total Bilirubin 12/31/2014 0.4  0.3 - 1.2 mg/dL Final  . GFR calc non Af Amer 12/31/2014 37* >60 mL/min Final  . GFR calc Af Amer 12/31/2014 43* >60 mL/min Final   Comment: (NOTE) The eGFR has been calculated using the CKD EPI equation. This calculation has not been validated in all clinical situations. eGFR's persistently <60 mL/min signify possible Chronic Kidney Disease.   . Anion gap 12/31/2014 7  5 - 15 Final      STUDIES: No results found.  ASSESSMENT: 81 year old lady with carcinoma of breast stage I disease.  Patient is off all chemotherapy because of progressing side effect Gradual improvement in performance status. Anemia  MEDICAL DECISION MAKING:  There is no clinical evidence of recurrent disease. There is gradual improvement in performance status Patient is estrogen receptor  and progesterone receptor negative so no anti-hormonal therapy has been recommended Anemia will be evaluated if there is no improvement in further GI workup may be recommended  Patient expressed understanding and was in agreement with this  plan. She also understands that She can call clinic at any time with any questions, concerns, or complaints.    Breast cancer, right, invasive ductal, triple negative   Staging form: Breast, AJCC 7th Edition     Clinical: Stage IA (T1c, N0, M0) - Marni Griffon, MD   01/05/2015 7:48 AM

## 2015-01-15 IMAGING — CR DG CHEST 1V PORT
1 series · 1 of 1 positions shown · non-contrast
Comparison: Chest radiograph 03/31/2013.

CLINICAL DATA: Port-A-Cath.  Postprocedural chest radiograph.

EXAM:
PORTABLE CHEST - 1 VIEW

[ap]
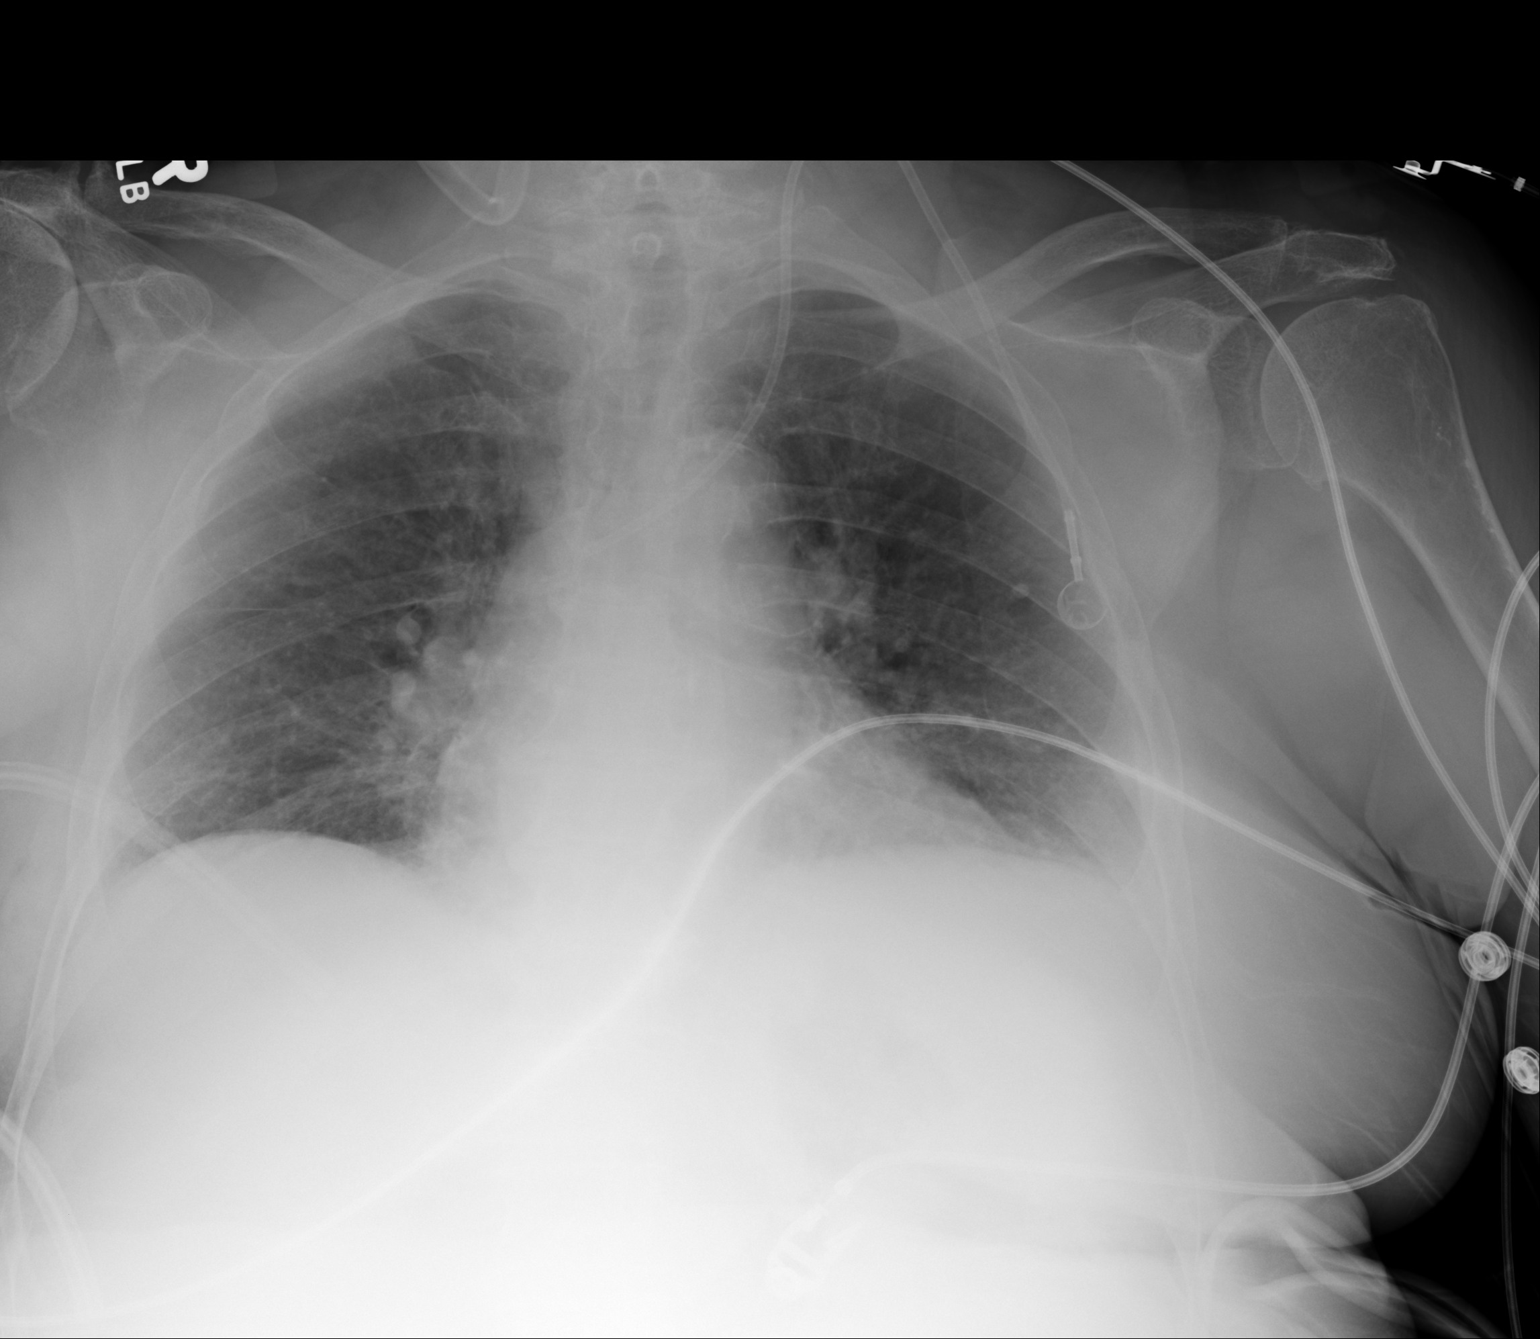

[1 of 1 positions shown; findings below may reference images not displayed]

FINDINGS: Low volume chest. No pneumothorax. Presumably new LEFT IJ
Port-A-Cath/ present. The tip terminates in the LEFT brachiocephalic
vein near the confluence of the brachiocephalic veins. Aortic arch
atherosclerosis. Cardiopericardial silhouette appears within normal
limits. Basilar atelectasis. LEFT glenohumeral osteoarthritis.
IMPRESSION: New LEFT IJ power port with the tip in the LEFT brachiocephalic
vein.

## 2015-02-12 ENCOUNTER — Inpatient Hospital Stay: Payer: Medicare Other | Attending: Oncology

## 2015-02-12 DIAGNOSIS — Z7982 Long term (current) use of aspirin: Secondary | ICD-10-CM | POA: Diagnosis not present

## 2015-02-12 DIAGNOSIS — Z79899 Other long term (current) drug therapy: Secondary | ICD-10-CM | POA: Insufficient documentation

## 2015-02-12 DIAGNOSIS — Z9011 Acquired absence of right breast and nipple: Secondary | ICD-10-CM | POA: Insufficient documentation

## 2015-02-12 DIAGNOSIS — Z8673 Personal history of transient ischemic attack (TIA), and cerebral infarction without residual deficits: Secondary | ICD-10-CM | POA: Insufficient documentation

## 2015-02-12 DIAGNOSIS — I129 Hypertensive chronic kidney disease with stage 1 through stage 4 chronic kidney disease, or unspecified chronic kidney disease: Secondary | ICD-10-CM | POA: Insufficient documentation

## 2015-02-12 DIAGNOSIS — D509 Iron deficiency anemia, unspecified: Secondary | ICD-10-CM | POA: Diagnosis not present

## 2015-02-12 DIAGNOSIS — C50911 Malignant neoplasm of unspecified site of right female breast: Secondary | ICD-10-CM | POA: Insufficient documentation

## 2015-02-12 DIAGNOSIS — I10 Essential (primary) hypertension: Secondary | ICD-10-CM | POA: Diagnosis not present

## 2015-02-12 DIAGNOSIS — M1990 Unspecified osteoarthritis, unspecified site: Secondary | ICD-10-CM | POA: Diagnosis not present

## 2015-02-12 DIAGNOSIS — Z888 Allergy status to other drugs, medicaments and biological substances status: Secondary | ICD-10-CM | POA: Insufficient documentation

## 2015-02-12 DIAGNOSIS — Z171 Estrogen receptor negative status [ER-]: Secondary | ICD-10-CM | POA: Insufficient documentation

## 2015-02-12 DIAGNOSIS — D631 Anemia in chronic kidney disease: Secondary | ICD-10-CM | POA: Diagnosis not present

## 2015-02-12 DIAGNOSIS — R600 Localized edema: Secondary | ICD-10-CM | POA: Insufficient documentation

## 2015-02-12 MED ORDER — HEPARIN SOD (PORK) LOCK FLUSH 100 UNIT/ML IV SOLN
INTRAVENOUS | Status: AC
  Filled 2015-02-12: qty 5

## 2015-02-15 ENCOUNTER — Other Ambulatory Visit: Payer: Self-pay | Admitting: *Deleted

## 2015-02-15 DIAGNOSIS — C50911 Malignant neoplasm of unspecified site of right female breast: Secondary | ICD-10-CM

## 2015-02-18 ENCOUNTER — Other Ambulatory Visit: Payer: Self-pay | Admitting: Oncology

## 2015-02-18 ENCOUNTER — Ambulatory Visit
Admission: RE | Admit: 2015-02-18 | Discharge: 2015-02-18 | Disposition: A | Discharge status: Home / Self Care | Payer: Medicare Other | Source: Ambulatory Visit | Attending: Oncology | Admitting: Oncology

## 2015-02-18 DIAGNOSIS — Z9011 Acquired absence of right breast and nipple: Secondary | ICD-10-CM | POA: Insufficient documentation

## 2015-02-18 DIAGNOSIS — Z853 Personal history of malignant neoplasm of breast: Secondary | ICD-10-CM | POA: Insufficient documentation

## 2015-02-18 DIAGNOSIS — C50911 Malignant neoplasm of unspecified site of right female breast: Secondary | ICD-10-CM

## 2015-02-18 DIAGNOSIS — Z1231 Encounter for screening mammogram for malignant neoplasm of breast: Secondary | ICD-10-CM | POA: Insufficient documentation

## 2015-02-18 HISTORY — DX: Malignant neoplasm of unspecified site of unspecified female breast: C50.919

## 2015-02-19 ENCOUNTER — Encounter: Payer: Self-pay | Admitting: Oncology

## 2015-02-19 ENCOUNTER — Inpatient Hospital Stay: Payer: Medicare Other

## 2015-02-19 ENCOUNTER — Inpatient Hospital Stay (HOSPITAL_BASED_OUTPATIENT_CLINIC_OR_DEPARTMENT_OTHER): Payer: Medicare Other | Admitting: Oncology

## 2015-02-19 VITALS — BP 199/92 | HR 66 | Temp 97.4°F | Wt 205.9 lb

## 2015-02-19 DIAGNOSIS — Z171 Estrogen receptor negative status [ER-]: Secondary | ICD-10-CM | POA: Diagnosis not present

## 2015-02-19 DIAGNOSIS — M1990 Unspecified osteoarthritis, unspecified site: Secondary | ICD-10-CM

## 2015-02-19 DIAGNOSIS — D631 Anemia in chronic kidney disease: Secondary | ICD-10-CM

## 2015-02-19 DIAGNOSIS — C50911 Malignant neoplasm of unspecified site of right female breast: Secondary | ICD-10-CM | POA: Diagnosis not present

## 2015-02-19 DIAGNOSIS — I10 Essential (primary) hypertension: Secondary | ICD-10-CM

## 2015-02-19 DIAGNOSIS — I129 Hypertensive chronic kidney disease with stage 1 through stage 4 chronic kidney disease, or unspecified chronic kidney disease: Secondary | ICD-10-CM

## 2015-02-19 DIAGNOSIS — D509 Iron deficiency anemia, unspecified: Secondary | ICD-10-CM

## 2015-02-19 DIAGNOSIS — R600 Localized edema: Secondary | ICD-10-CM

## 2015-02-19 DIAGNOSIS — Z888 Allergy status to other drugs, medicaments and biological substances status: Secondary | ICD-10-CM

## 2015-02-19 DIAGNOSIS — Z7982 Long term (current) use of aspirin: Secondary | ICD-10-CM

## 2015-02-19 DIAGNOSIS — Z79899 Other long term (current) drug therapy: Secondary | ICD-10-CM

## 2015-02-19 DIAGNOSIS — Z8673 Personal history of transient ischemic attack (TIA), and cerebral infarction without residual deficits: Secondary | ICD-10-CM

## 2015-02-19 LAB — COMPREHENSIVE METABOLIC PANEL
ALBUMIN: 3.6 g/dL (ref 3.5–5.0)
ALT: 10 U/L — ABNORMAL LOW (ref 14–54)
ANION GAP: 6 (ref 5–15)
AST: 18 U/L (ref 15–41)
Alkaline Phosphatase: 79 U/L (ref 38–126)
BILIRUBIN TOTAL: 0.6 mg/dL (ref 0.3–1.2)
BUN: 20 mg/dL (ref 6–20)
CALCIUM: 7.8 mg/dL — AB (ref 8.9–10.3)
CHLORIDE: 98 mmol/L — AB (ref 101–111)
CO2: 25 mmol/L (ref 22–32)
CREATININE: 1.33 mg/dL — AB (ref 0.44–1.00)
GFR, EST AFRICAN AMERICAN: 42 mL/min — AB (ref >60)
GFR, EST NON AFRICAN AMERICAN: 36 mL/min — AB (ref >60)
Glucose, Bld: 141 mg/dL — ABNORMAL HIGH (ref 65–99)
Potassium: 4.9 mmol/L (ref 3.5–5.1)
SODIUM: 129 mmol/L — AB (ref 135–145)
Total Protein: 7.1 g/dL (ref 6.5–8.1)

## 2015-02-19 LAB — CBC WITH DIFFERENTIAL/PLATELET
Basophils Absolute: 0.1 10*3/uL (ref 0–0.1)
Basophils Relative: 2 %
Eosinophils Absolute: 0.1 10*3/uL (ref 0–0.7)
Eosinophils Relative: 3 %
HCT: 29.3 % — ABNORMAL LOW (ref 35.0–47.0)
Hemoglobin: 9.5 g/dL — ABNORMAL LOW (ref 12.0–16.0)
Lymphocytes Relative: 19 %
Lymphs Abs: 0.9 10*3/uL — ABNORMAL LOW (ref 1.0–3.6)
MCH: 28 pg (ref 26.0–34.0)
MCHC: 32.4 g/dL (ref 32.0–36.0)
MCV: 86.6 fL (ref 80.0–100.0)
Monocytes Absolute: 0.4 10*3/uL (ref 0.2–0.9)
Monocytes Relative: 9 %
NEUTROS ABS: 3.1 10*3/uL (ref 1.4–6.5)
NEUTROS PCT: 67 %
Platelets: 257 10*3/uL (ref 150–440)
RBC: 3.39 MIL/uL — ABNORMAL LOW (ref 3.80–5.20)
RDW: 17.1 % — ABNORMAL HIGH (ref 11.5–14.5)
WBC: 4.6 10*3/uL (ref 3.6–11.0)

## 2015-02-19 LAB — IRON AND TIBC
IRON: 41 ug/dL (ref 28–170)
SATURATION RATIOS: 10 % — AB (ref 10.4–31.8)
TIBC: 418 ug/dL (ref 250–450)
UIBC: 377 ug/dL

## 2015-02-19 LAB — FERRITIN: Ferritin: 19 ng/mL (ref 11–307)

## 2015-02-19 NOTE — Progress Notes (Signed)
Patient does not have living will.  Declined information.  Smoked years ago.  BP elevated 100/92  HR 66.  Bilateral lower ext. Edema.  Audible wheezing.

## 2015-02-19 NOTE — Progress Notes (Signed)
[place] @ [facility] [phone]  Fax:([phone]) [place]: Nov 20, 1928  MR#: [id number]  MVE#:[id number]  Patient Care Team: Denton Lank, MD as PCP - General (Family Medicine) Forest Gleason, MD (Unknown Physician Specialty) Christene Lye, MD (General Surgery)  CHIEF COMPLAINT:  Chief Complaint  Patient presents with  . Follow-up    Oncology History   1. Carcinoma of the right breast T1c N0 M0 tumor based on ultrasound and the breast mammogram. ER negative, PR negative, HER-2/neu positive (diagnosis in July of 2015). 2. Iron deficiency anemia with chronic renal disease. 3. Status post mastectomy (right) in August of 2015. Patient has stage IC, ER negative, PR negative, HER-2 receptor positive.  4. Patient started on chemotherapy with Taxol and Herceptin on April 09, 2014. 5.because of progressive side effect Taxol was discontinued (August 28, 2014) Herceptin would be continued to July of 2016 6.  Because of swelling of lower extremity and shortness of breath Herceptin was discontinued in March of 2016      Breast cancer, right, invasive ductal, triple negative    Oncology Flowsheet 07/20/2013 07/24/2013 09/11/2013  dexamethasone (DECADRON) IJ - - -  diazepam (VALIUM) PO 2 mg - -  ondansetron (ZOFRAN) IV 4 mg - 4 mg    INTERVAL HISTORY: 81 year old lady with history of carcinoma of breast stage IC ER negative, PR negative, HER-2/neu positive tumor.  Patient is here for ongoing evaluation and treatment consideration.  No chills.  No fever.  Appetite is improving.  Swelling of lower extremity is improving.  Shortness of breath is improved. February 19, 2015 Patient is here for ongoing evaluation regarding carcinoma of breast.  He is HER-2 positive.  Estrogen and progesterone receptor negative disease.  Swelling in lower extremities improving.  Appetite is improving.  No chills.  No fever.  REVIEW OF SYSTEMS:   Gen. status:  shortness of breath is  improved.  Patient's performance status is improving. HEENT: No headache.  No hearing loss.  No ear pain.  No nosebleed or congestion.  No sore throat.  No difficulty swallowing Lungs: Shortness of breath is improved. Cardiac: No chest pain.  No paroxysmal nocturnal dyspnea Skin: No evidence of ecchymosis or rash.. Lower extremity swelling and redness is improved Neurological system: No dizziness.  No tingling.  His was.  no tingling numbness.  no focal weakness or any focal signs. Gastro intestinal system: No heartburn.  No nausea or vomiting.  No abdominal pain.  No diarrhea.  No constipation.  No rectal bleeding.   As per HPI. Otherwise, a complete review of systems is negatve.  PAST MEDICAL HISTORY: Past Medical History  Diagnosis Date  . Hypertension   . CHF (congestive heart failure)   . Stroke     August 2014, but has had strokes prior as well  . Cancer     Reports lumpectomy for a cyst  . Renal disorder   . Diabetes   . Hemorrhoids   . Arthritis   . Breast cancer 03/06/2014    right breast with mastectomy and chemo    PAST SURGICAL HISTORY: Past Surgical History  Procedure Laterality Date  . Abdominal hysterectomy      Patient not clear as to why  . Back surgery    . Hand surgery Right     Carpel tunnel release in the 1970s  . Lumbar laminectomy/decompression microdiscectomy Bilateral 07/24/2013    Procedure: LUMBAR LAMINECTOMY/DECOMPRESSION MICRODISCECTOMY LUMBAR THREE-FOUR;  Surgeon: Charlie Pitter, MD;  Location: [facility];  Service: Neurosurgery;  Laterality: Bilateral;  . Breast surgery Right 03/06/14    mastectomy  . Mastectomy Right 03/06/2014    right br ca  . Breast biopsy Right February 15 2014    invasive mammary carcinoma/ER/PR negative, HER 2 positive  . Breast biopsy Right 1999    negative biopsy    FAMILY HISTORY Family History  Problem Relation Age of Onset  . Cancer Sister 62    breast  . Breast cancer Sister 67  . Cancer Brother     kidney   . Cancer Maternal Aunt     breast  . Breast cancer Maternal Aunt 70  . Cancer Other     maternal niece with breast cancer  . Breast cancer Other   . Cancer Sister     pancreatic        ADVANCED DIRECTIVES: Patient does have advanced health care directive   HEALTH MAINTENANCE: History  Substance Use Topics  . Smoking status: Never Smoker   . Smokeless tobacco: Never Used  . Alcohol Use: No      No Known Allergies  Current Outpatient Prescriptions  Medication Sig Dispense Refill  . amLODipine (NORVASC) 5 MG tablet Take 5 mg by mouth daily.    Marland Kitchen aspirin EC 81 MG tablet Take 81 mg by mouth daily.    . Calcium-Magnesium-Vitamin D (CALCIUM 500 PO) Take 500 mg by mouth daily.    . clopidogrel (PLAVIX) 75 MG tablet Take 75 mg by mouth daily with breakfast.    . docusate sodium 100 MG CAPS Take 100 mg by mouth daily. 30 capsule 0  . doxycycline (VIBRAMYCIN) 100 MG capsule Take 1 capsule (100 mg total) by mouth daily. 7 capsule 0  . furosemide (LASIX) 20 MG tablet Take 20 mg by mouth 2 (two) times daily.    Marland Kitchen gabapentin (NEURONTIN) 300 MG capsule Take 300 mg by mouth 2 (two) times daily.     Marland Kitchen glipiZIDE (GLUCOTROL XL) 10 MG 24 hr tablet Take 10 mg by mouth 2 (two) times daily.    . hydrALAZINE (APRESOLINE) 25 MG tablet Take 25 mg by mouth 3 (three) times daily.    Marland Kitchen HYDROcodone-acetaminophen (NORCO/VICODIN) 5-325 MG per tablet Take 1 tablet by mouth every 6 (six) hours as needed.     . metoprolol succinate (TOPROL-XL) 100 MG 24 hr tablet Take 100 mg by mouth daily. Take with or immediately following a meal.    . omega-3 acid ethyl esters (LOVAZA) 1 G capsule Take 1 g by mouth 2 (two) times daily.     Marland Kitchen omeprazole (PRILOSEC) 20 MG capsule Take 20 mg by mouth daily.     . ondansetron (ZOFRAN ODT) 4 MG disintegrating tablet Take 1 tablet (4 mg total) by mouth every 8 (eight) hours as needed for nausea or vomiting. 10 tablet 0  . oxybutynin (DITROPAN) 5 MG tablet Take 10 mg by mouth  every evening. Takes 3m in the morning and 144min the evening    . pantoprazole (PROTONIX) 40 MG tablet Take 1 tablet (40 mg total) by mouth daily. 30 tablet 3  . polyethylene glycol powder (GLYCOLAX/MIRALAX) powder Take by mouth as needed.     . pravastatin (PRAVACHOL) 80 MG tablet Take 80 mg by mouth every evening.     . quinapril (ACCUPRIL) 40 MG tablet Take 40 mg by mouth at bedtime.     No current facility-administered medications for this visit.    OBJECTIVE:  Filed  Vitals:   02/19/15 1139  BP: 199/92  Pulse: 66  Temp: 97.4 F (36.3 C)     Body mass index is 38.93 kg/(m^2).    ECOG FS:1 - Symptomatic but completely ambulatory  PHYSICAL EXAM: GENERAL status: Performance status is good.  Patient has not lost significant weight HEENT: No evidence of stomatitis. Sclera and conjunctivae :: No jaundice.   pale looking. Lungs: Air  entry equal on both sides.  No rhonchi.  No rales.  Cardiac: Heart sounds are normal.  No pericardial rub.  No murmur. Lymphatic system: Cervical, axillary, inguinal, lymph nodes not palpable GI: Abdomen is soft.  No ascites.  Liver spleen not palpable.  No tenderness.  Bowel sounds are within normal limit Lower extremity: No edema Neurological system: Higher functions, cranial nerves intact no evidence of peripheral neuropathy. Skin: No rash.  No ecchymosis.Marland Kitchen t  right breast mastectomy no evidence of recurrent disease  LEFT breast: No palpable masses.  Axillary lymph nodes clear  LAB RESULTS:  Appointment on 02/19/2015  Component Date Value Ref Range Status  . WBC 02/19/2015 4.6  3.6 - 11.0 K/uL Final  . RBC 02/19/2015 3.39* 3.80 - 5.20 MIL/uL Final  . Hemoglobin 02/19/2015 9.5* 12.0 - 16.0 g/dL Final  . HCT 02/19/2015 29.3* 35.0 - 47.0 % Final  . MCV 02/19/2015 86.6  80.0 - 100.0 fL Final  . MCH 02/19/2015 28.0  26.0 - 34.0 pg Final  . MCHC 02/19/2015 32.4  32.0 - 36.0 g/dL Final  . RDW 02/19/2015 17.1* 11.5 - 14.5 % Final  . Platelets  02/19/2015 257  150 - 440 K/uL Final  . Neutrophils Relative % 02/19/2015 67   Final  . Neutro Abs 02/19/2015 3.1  1.4 - 6.5 K/uL Final  . Lymphocytes Relative 02/19/2015 19   Final  . Lymphs Abs 02/19/2015 0.9* 1.0 - 3.6 K/uL Final  . Monocytes Relative 02/19/2015 9   Final  . Monocytes Absolute 02/19/2015 0.4  0.2 - 0.9 K/uL Final  . Eosinophils Relative 02/19/2015 3   Final  . Eosinophils Absolute 02/19/2015 0.1  0 - 0.7 K/uL Final  . Basophils Relative 02/19/2015 2   Final  . Basophils Absolute 02/19/2015 0.1  0 - 0.1 K/uL Final  . Sodium 02/19/2015 129* 135 - 145 mmol/L Final  . Potassium 02/19/2015 4.9  3.5 - 5.1 mmol/L Final  . Chloride 02/19/2015 98* 101 - 111 mmol/L Final  . CO2 02/19/2015 25  22 - 32 mmol/L Final  . Glucose, Bld 02/19/2015 141* 65 - 99 mg/dL Final  . BUN 02/19/2015 20  6 - 20 mg/dL Final  . Creatinine, Ser 02/19/2015 1.33* 0.44 - 1.00 mg/dL Final  . Calcium 02/19/2015 7.8* 8.9 - 10.3 mg/dL Final  . Total Protein 02/19/2015 7.1  6.5 - 8.1 g/dL Final  . Albumin 02/19/2015 3.6  3.5 - 5.0 g/dL Final  . AST 02/19/2015 18  15 - 41 U/L Final  . ALT 02/19/2015 10* 14 - 54 U/L Final  . Alkaline Phosphatase 02/19/2015 79  38 - 126 U/L Final  . Total Bilirubin 02/19/2015 0.6  0.3 - 1.2 mg/dL Final  . GFR calc non Af Amer 02/19/2015 36* >60 mL/min Final  . GFR calc Af Amer 02/19/2015 42* >60 mL/min Final   Comment: (NOTE) The eGFR has been calculated using the CKD EPI equation. This calculation has not been validated in all clinical situations. eGFR's persistently <60 mL/min signify possible Chronic Kidney Disease.   . Anion gap 02/19/2015 6  5 - 15 Final  . Ferritin 02/19/2015 19  11 - 307 ng/mL Final  . Iron 02/19/2015 41  28 - 170 ug/dL Final  . TIBC 02/19/2015 418  250 - 450 ug/dL Final  . Saturation Ratios 02/19/2015 10* 10.4 - 31.8 % Final  . UIBC 02/19/2015 377   Final      STUDIES: Mm Screening Breast Tomo Uni L  02/18/2015   CLINICAL DATA:   Screening.  EXAM: DIGITAL SCREENING UNILATERAL LEFT MAMMOGRAM WITH TOMO AND CAD  COMPARISON:  Previous exam(s).  ACR Breast Density Category c: The breast tissue is heterogeneously dense, which may obscure small masses.  FINDINGS: There are no findings suspicious for malignancy. Images were processed with CAD.  IMPRESSION: No mammographic evidence of malignancy. A result letter of this screening mammogram will be mailed directly to the patient.  RECOMMENDATION: Screening mammogram in one year. (Code:SM-B-01Y)  BI-RADS CATEGORY  1: Negative.   Electronically Signed   By: Marin Olp M.D.   On: 02/18/2015 13:41    ASSESSMENT: 81 year old lady with carcinoma of breast stage I disease.  Patient is off all chemotherapy because of progressing side effect Gradual improvement in performance status. Anemia  MEDICAL DECISION MAKING:  There is no clinical evidence of recurrent disease. There is gradual improvement in performance status Patient is estrogen receptor and progesterone receptor negative so no anti-hormonal therapy has been recommended Anemia will be evaluated if there is no improvement in further GI workup may be recommended Stool for occult blood is being checked.  Iron studies being ordered. GI workup will be recommended if there is blood in the stool  Patient expressed understanding and was in agreement with this plan. She also understands that She can call clinic at any time with any questions, concerns, or complaints.    Breast cancer, right, invasive ductal, triple negative   Staging form: Breast, AJCC 7th Edition     Clinical: Stage IA (T1c, N0, M0) - Marni Griffon, MD   02/19/2015 8:38 PM

## 2015-02-23 DIAGNOSIS — C50911 Malignant neoplasm of unspecified site of right female breast: Secondary | ICD-10-CM | POA: Diagnosis not present

## 2015-02-24 DIAGNOSIS — C50911 Malignant neoplasm of unspecified site of right female breast: Secondary | ICD-10-CM | POA: Diagnosis not present

## 2015-02-25 ENCOUNTER — Other Ambulatory Visit: Payer: Self-pay

## 2015-02-25 DIAGNOSIS — C50911 Malignant neoplasm of unspecified site of right female breast: Secondary | ICD-10-CM

## 2015-02-25 LAB — OCCULT BLOOD X 1 CARD TO LAB, STOOL
Fecal Occult Bld: NEGATIVE
Fecal Occult Bld: NEGATIVE
Fecal Occult Bld: NEGATIVE

## 2015-03-05 ENCOUNTER — Ambulatory Visit
Admission: RE | Admit: 2015-03-05 | Discharge: 2015-03-05 | Disposition: A | Discharge status: Home / Self Care | Payer: Medicare Other | Source: Ambulatory Visit | Attending: Family Medicine | Admitting: Family Medicine

## 2015-03-05 ENCOUNTER — Other Ambulatory Visit: Payer: Self-pay | Admitting: Family Medicine

## 2015-03-05 DIAGNOSIS — R609 Edema, unspecified: Secondary | ICD-10-CM

## 2015-03-05 DIAGNOSIS — I7090 Unspecified atherosclerosis: Secondary | ICD-10-CM | POA: Insufficient documentation

## 2015-03-05 DIAGNOSIS — J811 Chronic pulmonary edema: Secondary | ICD-10-CM | POA: Diagnosis not present

## 2015-03-05 DIAGNOSIS — J90 Pleural effusion, not elsewhere classified: Secondary | ICD-10-CM | POA: Insufficient documentation

## 2015-03-05 DIAGNOSIS — I482 Chronic atrial fibrillation, unspecified: Secondary | ICD-10-CM

## 2015-03-05 DIAGNOSIS — R0600 Dyspnea, unspecified: Secondary | ICD-10-CM

## 2015-03-24 IMAGING — CR DG CHEST 2V
1 series · 2 of 2 positions shown · non-contrast
Comparison: 03/06/2014 and 03/31/2013

CLINICAL DATA: History of breast cancer post mastectomy with
low-grade fever, nausea and vomiting. Last chemotherapy on [REDACTED].

EXAM:
CHEST  2 VIEW

[Series 1: dxr chest pa (or ap) and lateral · 0.14mm/px · 2 of 2 slices shown]
[im 1/2]
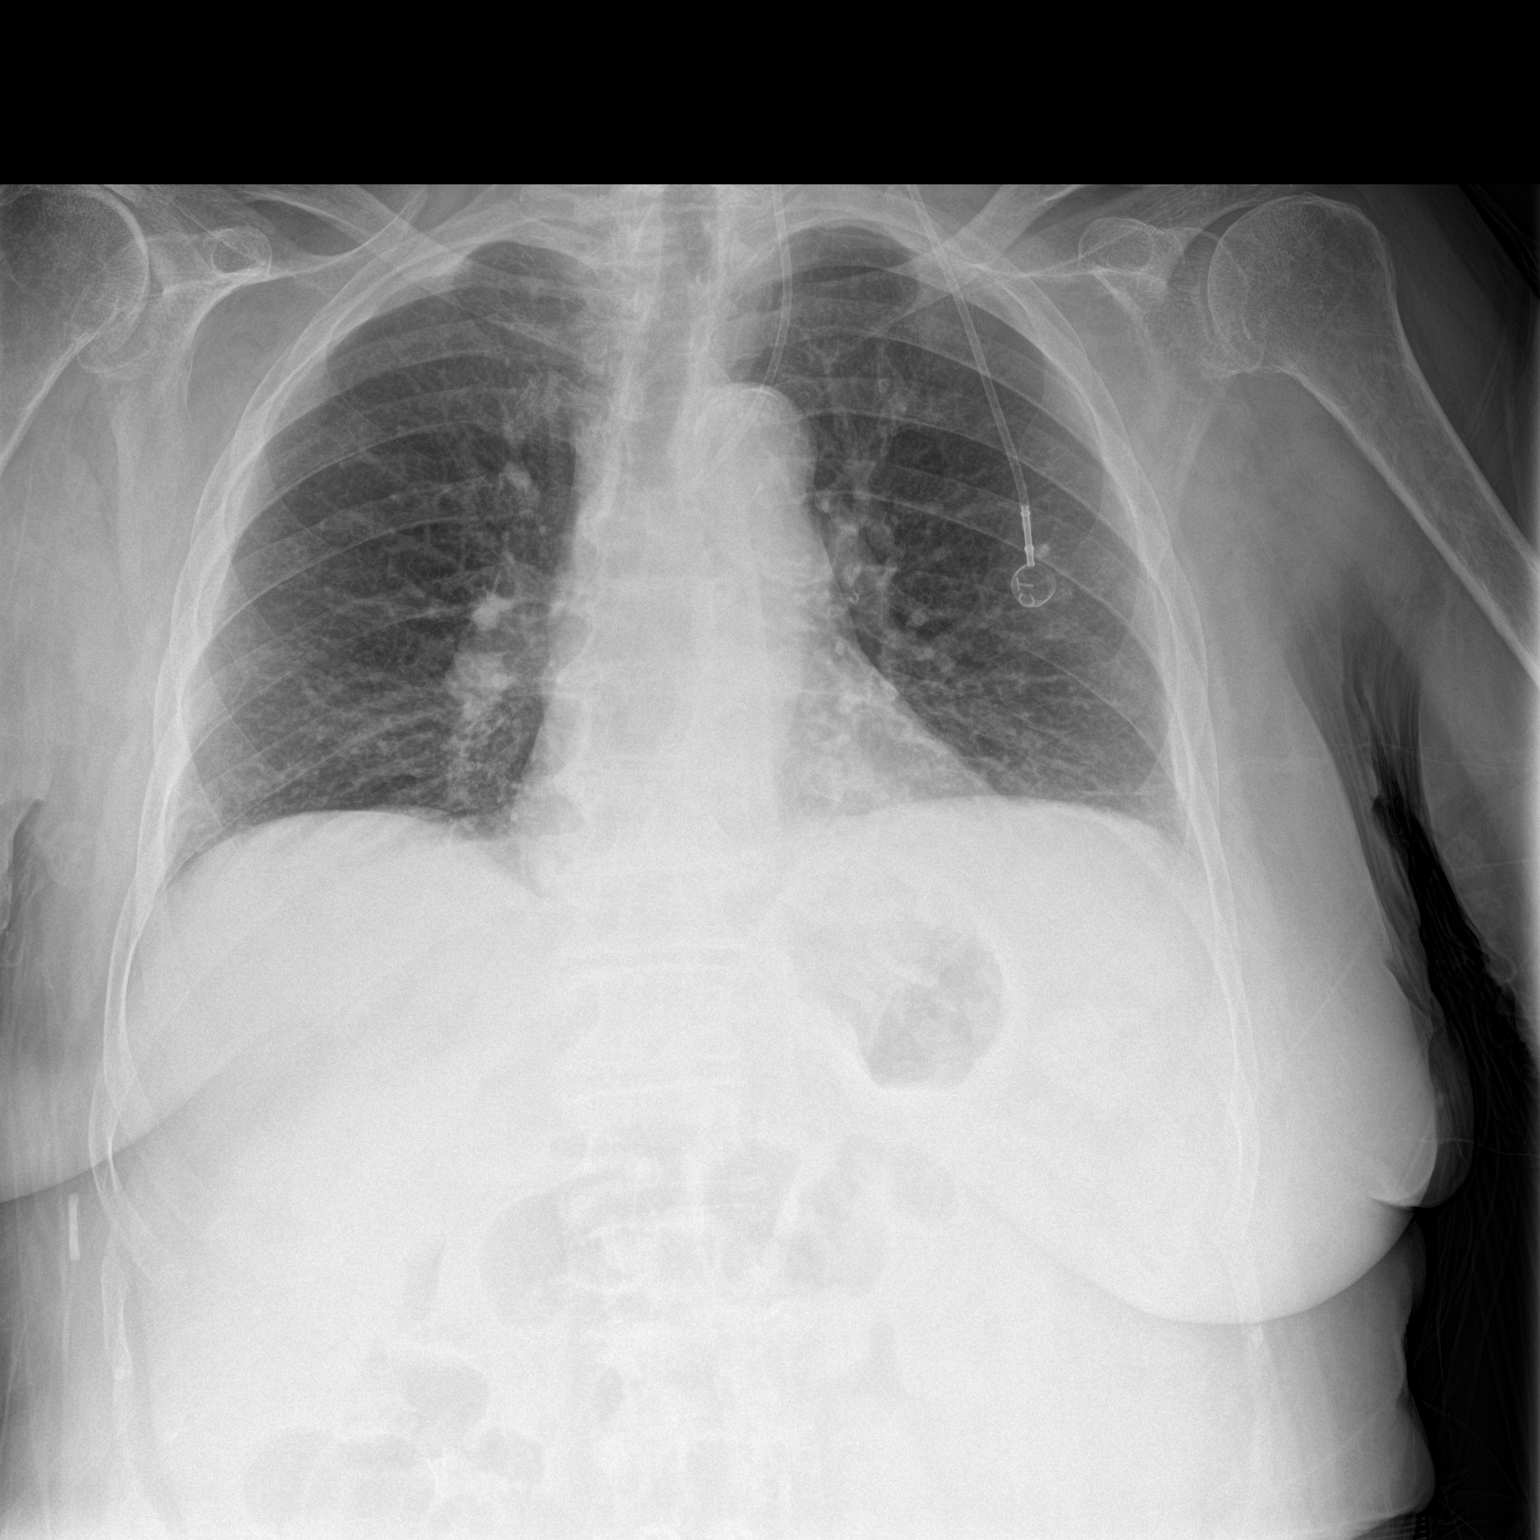
[im 2/2]
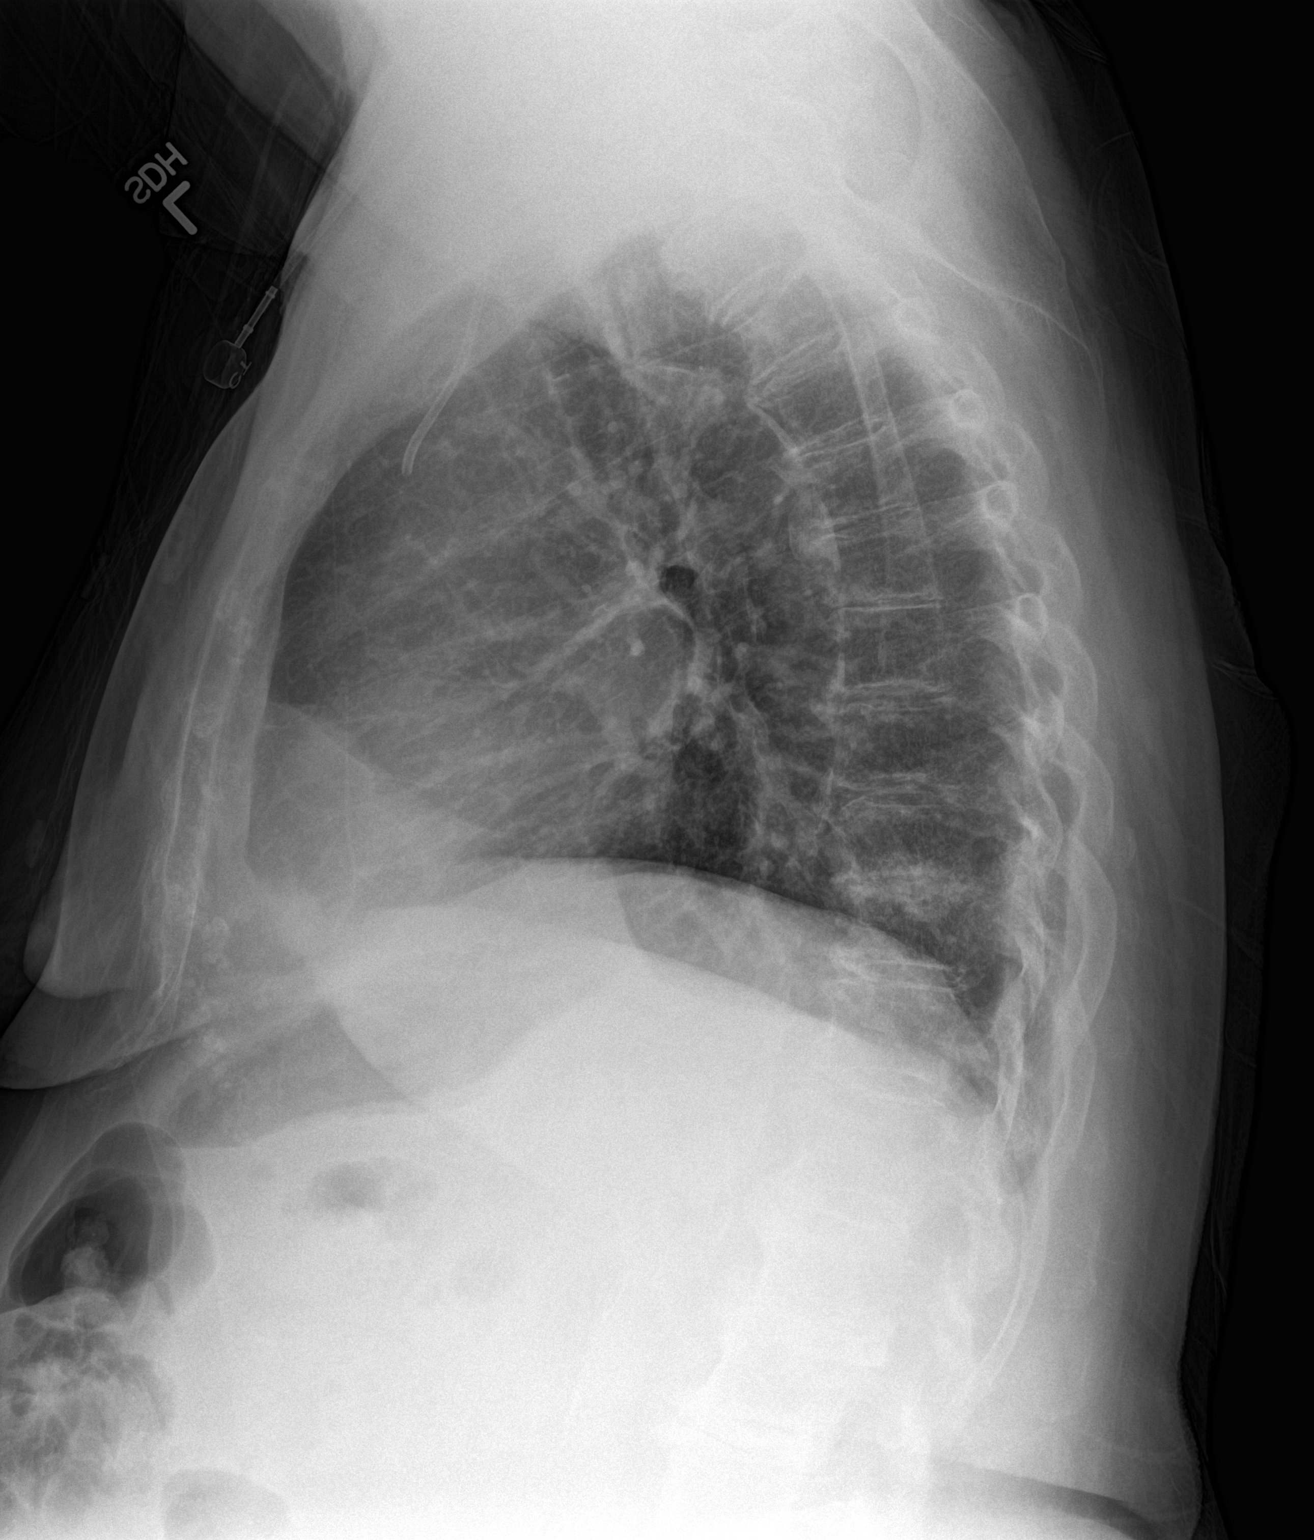

[2 of 2 positions shown; findings below may reference images not displayed]

FINDINGS: Left IJ Port-A-Cath has tip in the midline over the region of the
left brachiocephalic vein as this has been pulled back slightly
compared to the previous exam.

Lungs are hypoinflated without focal consolidation or effusion.
Cardiomediastinal silhouette and remainder the exam is unchanged.
IMPRESSION: Hypoinflation without acute cardiopulmonary disease.

## 2015-03-25 IMAGING — CR DG CHEST 1V PORT
1 series · 1 of 1 positions shown · non-contrast
Comparison: Most recent prior chest x-ray 05/13/2014

CLINICAL DATA: 84-year-old female with breast cancer status post
mastectomy currently on chemotherapy and admitted with low-grade
fever, nausea and vomiting. Currently with shortness of breath.

EXAM:
PORTABLE CHEST - 1 VIEW

[ap]
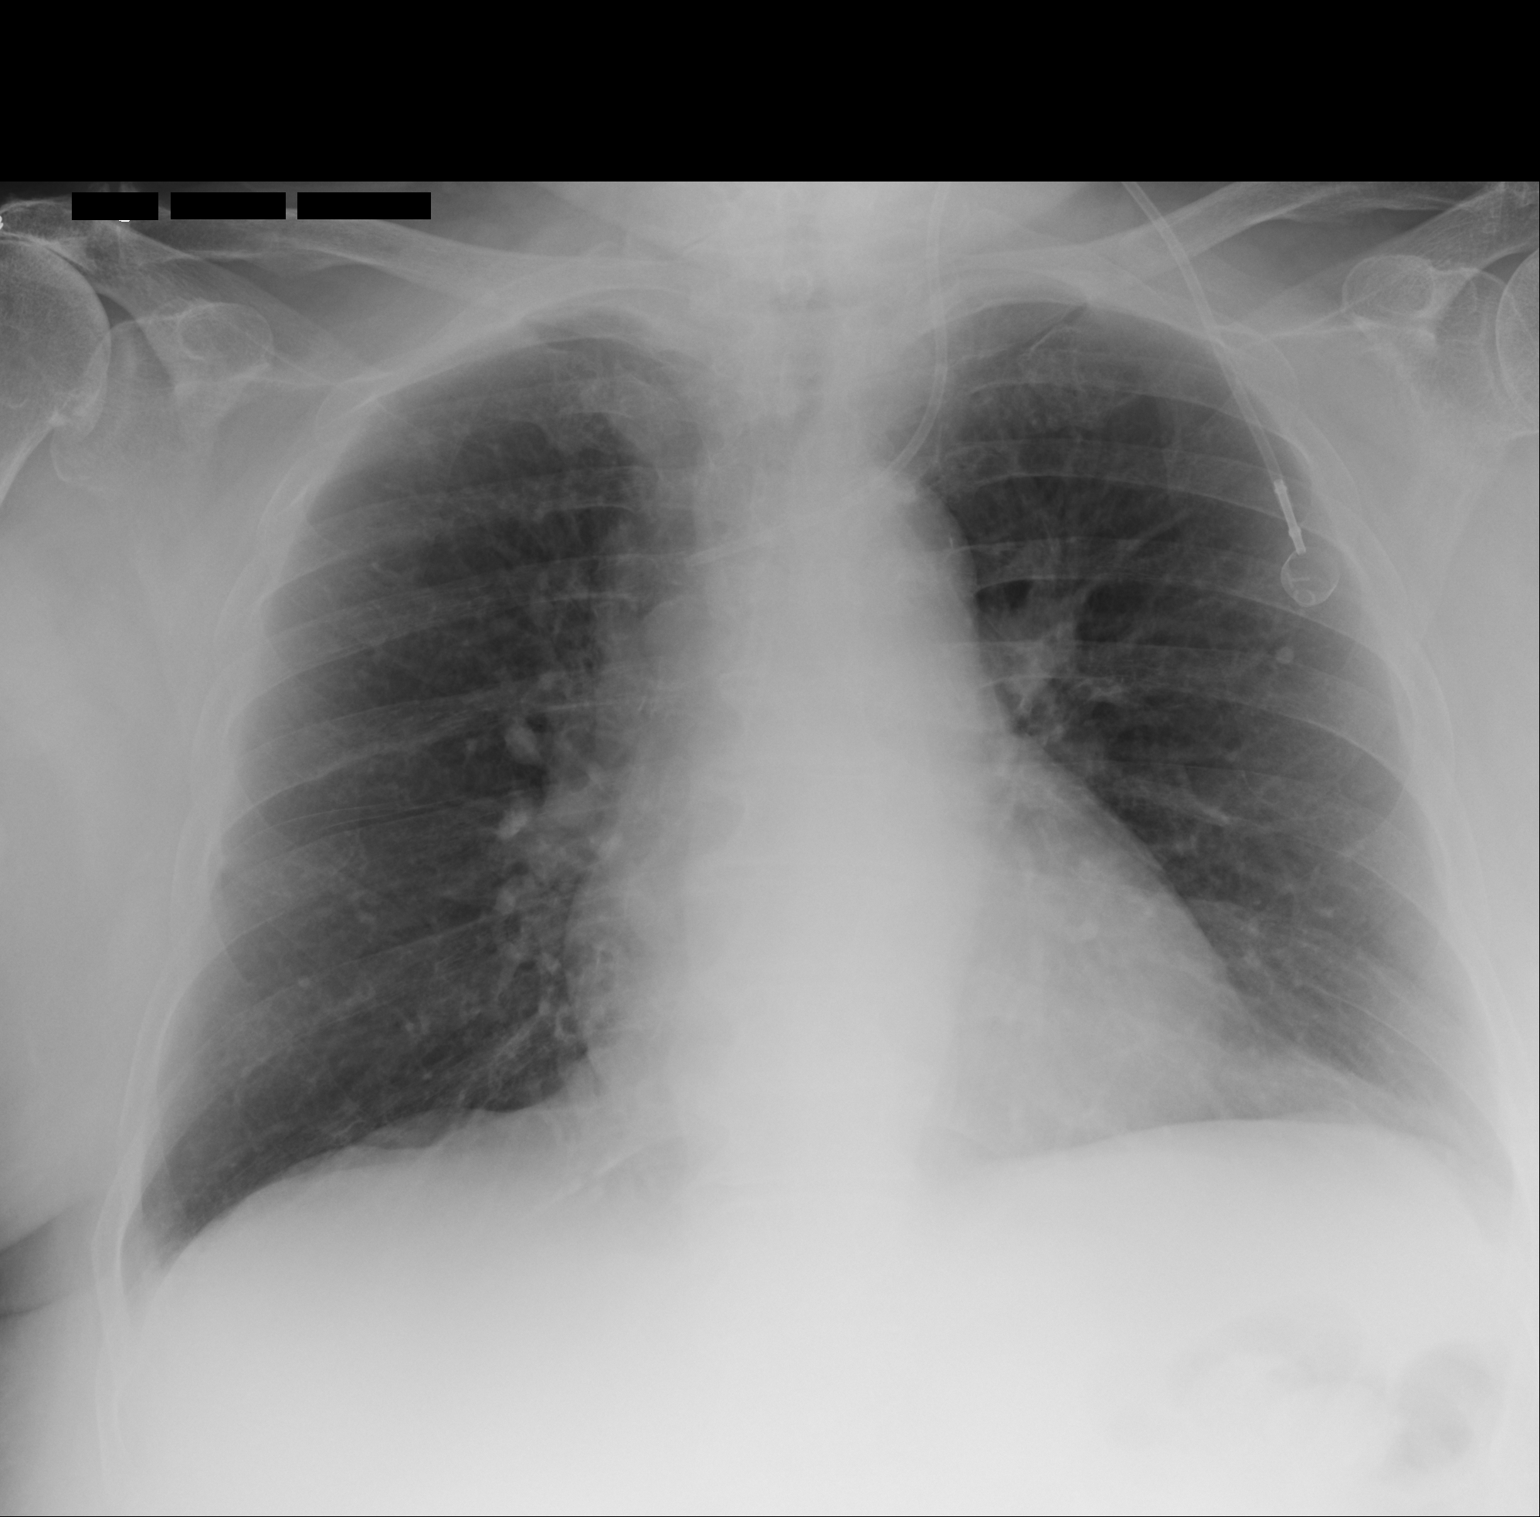

[1 of 1 positions shown; findings below may reference images not displayed]

FINDINGS: Stable position of left IJ approach single-lumen power injectable
port catheter. The catheter tip projects over the distal left
brachiocephalic vein. Cardiac and mediastinal contours are similar.
Atherosclerotic calcifications again noted in the transverse aorta.
Very mild pulmonary vascular congestion without overt edema or
significant interval change. Chronic basilar interstitial prominence
also similar compared to prior. There may be slightly increased
linear opacity in the left lung base consistent with atelectasis. No
pleural effusion or pneumothorax. No acute osseous abnormality.
IMPRESSION: 1. Slightly increased left basilar subsegmental atelectasis.
2. Otherwise, unchanged appearance of the chest.

## 2015-03-26 ENCOUNTER — Inpatient Hospital Stay: Payer: Medicare Other | Attending: Oncology

## 2015-03-26 DIAGNOSIS — Z452 Encounter for adjustment and management of vascular access device: Secondary | ICD-10-CM | POA: Insufficient documentation

## 2015-03-26 DIAGNOSIS — C50911 Malignant neoplasm of unspecified site of right female breast: Secondary | ICD-10-CM | POA: Diagnosis present

## 2015-03-26 DIAGNOSIS — Z171 Estrogen receptor negative status [ER-]: Secondary | ICD-10-CM | POA: Insufficient documentation

## 2015-03-26 DIAGNOSIS — Z9221 Personal history of antineoplastic chemotherapy: Secondary | ICD-10-CM | POA: Insufficient documentation

## 2015-03-26 DIAGNOSIS — C801 Malignant (primary) neoplasm, unspecified: Secondary | ICD-10-CM

## 2015-03-26 MED ORDER — HEPARIN SOD (PORK) LOCK FLUSH 100 UNIT/ML IV SOLN
INTRAVENOUS | Status: AC
Start: 2015-03-26 — End: 2015-03-26
  Filled 2015-03-26: qty 5

## 2015-03-26 MED ORDER — HEPARIN SOD (PORK) LOCK FLUSH 100 UNIT/ML IV SOLN
500.0000 [IU] | Freq: Once | INTRAVENOUS | Status: AC
  Administered 2015-03-26: 500 [IU] via INTRAVENOUS

## 2015-03-26 MED ORDER — SODIUM CHLORIDE 0.9 % IJ SOLN
10.0000 mL | Freq: Once | INTRAMUSCULAR | Status: AC
  Administered 2015-03-26: 10 mL via INTRAVENOUS
  Filled 2015-03-26: qty 10

## 2015-04-04 ENCOUNTER — Other Ambulatory Visit: Payer: Medicare Other

## 2015-04-04 ENCOUNTER — Ambulatory Visit: Payer: Medicare Other | Admitting: Oncology

## 2015-05-07 ENCOUNTER — Inpatient Hospital Stay: Payer: Medicare Other | Attending: Oncology

## 2015-05-07 DIAGNOSIS — Z452 Encounter for adjustment and management of vascular access device: Secondary | ICD-10-CM | POA: Diagnosis not present

## 2015-05-07 DIAGNOSIS — Z9221 Personal history of antineoplastic chemotherapy: Secondary | ICD-10-CM | POA: Diagnosis not present

## 2015-05-07 DIAGNOSIS — C50911 Malignant neoplasm of unspecified site of right female breast: Secondary | ICD-10-CM | POA: Insufficient documentation

## 2015-05-07 DIAGNOSIS — Z171 Estrogen receptor negative status [ER-]: Secondary | ICD-10-CM | POA: Diagnosis not present

## 2015-05-07 DIAGNOSIS — C801 Malignant (primary) neoplasm, unspecified: Secondary | ICD-10-CM

## 2015-05-07 MED ORDER — HEPARIN SOD (PORK) LOCK FLUSH 100 UNIT/ML IV SOLN
500.0000 [IU] | Freq: Once | INTRAVENOUS | Status: AC
  Administered 2015-05-07: 500 [IU] via INTRAVENOUS
  Filled 2015-05-07: qty 5

## 2015-05-07 MED ORDER — SODIUM CHLORIDE 0.9 % IJ SOLN
10.0000 mL | Freq: Once | INTRAMUSCULAR | Status: AC
  Administered 2015-05-07: 10 mL via INTRAVENOUS
  Filled 2015-05-07: qty 10

## 2015-06-18 ENCOUNTER — Inpatient Hospital Stay: Payer: Medicare Other | Attending: Oncology

## 2015-06-18 DIAGNOSIS — Z452 Encounter for adjustment and management of vascular access device: Secondary | ICD-10-CM | POA: Diagnosis not present

## 2015-06-18 DIAGNOSIS — C50911 Malignant neoplasm of unspecified site of right female breast: Secondary | ICD-10-CM | POA: Diagnosis not present

## 2015-06-18 DIAGNOSIS — Z171 Estrogen receptor negative status [ER-]: Secondary | ICD-10-CM | POA: Insufficient documentation

## 2015-06-18 DIAGNOSIS — C801 Malignant (primary) neoplasm, unspecified: Secondary | ICD-10-CM

## 2015-06-18 MED ORDER — SODIUM CHLORIDE 0.9 % IJ SOLN
10.0000 mL | INTRAMUSCULAR | Status: DC | PRN
  Administered 2015-06-18: 10 mL via INTRAVENOUS
  Filled 2015-06-18: qty 10

## 2015-06-18 MED ORDER — HEPARIN SOD (PORK) LOCK FLUSH 100 UNIT/ML IV SOLN
500.0000 [IU] | Freq: Once | INTRAVENOUS | Status: AC
  Administered 2015-06-18: 500 [IU] via INTRAVENOUS
  Filled 2015-06-18: qty 5

## 2015-07-30 ENCOUNTER — Inpatient Hospital Stay: Payer: Medicare Other | Attending: Oncology

## 2015-08-27 ENCOUNTER — Inpatient Hospital Stay: Payer: Medicare Other | Admitting: Oncology

## 2015-08-27 ENCOUNTER — Inpatient Hospital Stay: Payer: Medicare Other

## 2015-09-11 ENCOUNTER — Inpatient Hospital Stay: Payer: Medicare Other | Attending: Oncology

## 2015-09-11 ENCOUNTER — Inpatient Hospital Stay: Payer: Medicare Other

## 2015-09-11 ENCOUNTER — Inpatient Hospital Stay (HOSPITAL_BASED_OUTPATIENT_CLINIC_OR_DEPARTMENT_OTHER): Payer: Medicare Other | Admitting: Oncology

## 2015-09-11 VITALS — BP 176/80 | HR 71 | Temp 97.6°F | Resp 18 | Wt 199.2 lb

## 2015-09-11 DIAGNOSIS — D509 Iron deficiency anemia, unspecified: Secondary | ICD-10-CM | POA: Insufficient documentation

## 2015-09-11 DIAGNOSIS — Z8051 Family history of malignant neoplasm of kidney: Secondary | ICD-10-CM | POA: Insufficient documentation

## 2015-09-11 DIAGNOSIS — M79606 Pain in leg, unspecified: Secondary | ICD-10-CM | POA: Insufficient documentation

## 2015-09-11 DIAGNOSIS — Z9011 Acquired absence of right breast and nipple: Secondary | ICD-10-CM | POA: Insufficient documentation

## 2015-09-11 DIAGNOSIS — E1122 Type 2 diabetes mellitus with diabetic chronic kidney disease: Secondary | ICD-10-CM

## 2015-09-11 DIAGNOSIS — Z8673 Personal history of transient ischemic attack (TIA), and cerebral infarction without residual deficits: Secondary | ICD-10-CM | POA: Insufficient documentation

## 2015-09-11 DIAGNOSIS — D631 Anemia in chronic kidney disease: Secondary | ICD-10-CM | POA: Insufficient documentation

## 2015-09-11 DIAGNOSIS — Z87828 Personal history of other (healed) physical injury and trauma: Secondary | ICD-10-CM | POA: Insufficient documentation

## 2015-09-11 DIAGNOSIS — M189 Osteoarthritis of first carpometacarpal joint, unspecified: Secondary | ICD-10-CM

## 2015-09-11 DIAGNOSIS — Z803 Family history of malignant neoplasm of breast: Secondary | ICD-10-CM | POA: Diagnosis not present

## 2015-09-11 DIAGNOSIS — R600 Localized edema: Secondary | ICD-10-CM | POA: Insufficient documentation

## 2015-09-11 DIAGNOSIS — Z9221 Personal history of antineoplastic chemotherapy: Secondary | ICD-10-CM | POA: Insufficient documentation

## 2015-09-11 DIAGNOSIS — M549 Dorsalgia, unspecified: Secondary | ICD-10-CM | POA: Diagnosis not present

## 2015-09-11 DIAGNOSIS — Z171 Estrogen receptor negative status [ER-]: Secondary | ICD-10-CM | POA: Diagnosis not present

## 2015-09-11 DIAGNOSIS — G8929 Other chronic pain: Secondary | ICD-10-CM

## 2015-09-11 DIAGNOSIS — I129 Hypertensive chronic kidney disease with stage 1 through stage 4 chronic kidney disease, or unspecified chronic kidney disease: Secondary | ICD-10-CM | POA: Insufficient documentation

## 2015-09-11 DIAGNOSIS — N189 Chronic kidney disease, unspecified: Secondary | ICD-10-CM | POA: Diagnosis not present

## 2015-09-11 DIAGNOSIS — C50911 Malignant neoplasm of unspecified site of right female breast: Secondary | ICD-10-CM

## 2015-09-11 DIAGNOSIS — M1990 Unspecified osteoarthritis, unspecified site: Secondary | ICD-10-CM | POA: Insufficient documentation

## 2015-09-11 DIAGNOSIS — Z853 Personal history of malignant neoplasm of breast: Secondary | ICD-10-CM | POA: Insufficient documentation

## 2015-09-11 LAB — CBC WITH DIFFERENTIAL/PLATELET
Basophils Absolute: 0.1 10*3/uL (ref 0–0.1)
Basophils Relative: 1 %
EOS ABS: 0.2 10*3/uL (ref 0–0.7)
EOS PCT: 3 %
HCT: 29.3 % — ABNORMAL LOW (ref 35.0–47.0)
Hemoglobin: 9.7 g/dL — ABNORMAL LOW (ref 12.0–16.0)
LYMPHS ABS: 1.4 10*3/uL (ref 1.0–3.6)
Lymphocytes Relative: 22 %
MCH: 28.7 pg (ref 26.0–34.0)
MCHC: 33 g/dL (ref 32.0–36.0)
MCV: 87 fL (ref 80.0–100.0)
MONO ABS: 0.4 10*3/uL (ref 0.2–0.9)
MONOS PCT: 7 %
Neutro Abs: 4.2 10*3/uL (ref 1.4–6.5)
Neutrophils Relative %: 67 %
PLATELETS: 213 10*3/uL (ref 150–440)
RBC: 3.37 MIL/uL — ABNORMAL LOW (ref 3.80–5.20)
RDW: 16.3 % — AB (ref 11.5–14.5)
WBC: 6.3 10*3/uL (ref 3.6–11.0)

## 2015-09-11 LAB — COMPREHENSIVE METABOLIC PANEL
ALK PHOS: 73 U/L (ref 38–126)
ALT: 11 U/L — ABNORMAL LOW (ref 14–54)
ANION GAP: 5 (ref 5–15)
AST: 16 U/L (ref 15–41)
Albumin: 3.5 g/dL (ref 3.5–5.0)
BUN: 28 mg/dL — ABNORMAL HIGH (ref 6–20)
CALCIUM: 8.3 mg/dL — AB (ref 8.9–10.3)
CO2: 26 mmol/L (ref 22–32)
Chloride: 104 mmol/L (ref 101–111)
Creatinine, Ser: 1.31 mg/dL — ABNORMAL HIGH (ref 0.44–1.00)
GFR calc non Af Amer: 36 mL/min — ABNORMAL LOW (ref >60)
GFR, EST AFRICAN AMERICAN: 42 mL/min — AB (ref >60)
Glucose, Bld: 92 mg/dL (ref 65–99)
POTASSIUM: 4.1 mmol/L (ref 3.5–5.1)
SODIUM: 135 mmol/L (ref 135–145)
TOTAL PROTEIN: 7 g/dL (ref 6.5–8.1)
Total Bilirubin: 0.5 mg/dL (ref 0.3–1.2)

## 2015-09-11 MED ORDER — HEPARIN SOD (PORK) LOCK FLUSH 100 UNIT/ML IV SOLN
500.0000 [IU] | Freq: Once | INTRAVENOUS | Status: AC
  Administered 2015-09-11: 500 [IU] via INTRAVENOUS

## 2015-09-11 MED ORDER — HEPARIN SOD (PORK) LOCK FLUSH 100 UNIT/ML IV SOLN
INTRAVENOUS | Status: AC
  Filled 2015-09-11: qty 5

## 2015-09-11 MED ORDER — SODIUM CHLORIDE 0.9% FLUSH
10.0000 mL | INTRAVENOUS | Status: DC | PRN
  Administered 2015-09-11: 10 mL via INTRAVENOUS
  Filled 2015-09-11: qty 10
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
## 2015-12-11 ENCOUNTER — Encounter: Payer: Self-pay | Admitting: Pain Medicine

## 2015-12-11 ENCOUNTER — Ambulatory Visit: Payer: Medicare Other | Attending: Pain Medicine | Admitting: Pain Medicine

## 2015-12-11 VITALS — BP 160/52 | HR 65 | Temp 98.4°F | Resp 18 | Ht 61.0 in | Wt 198.0 lb

## 2015-12-11 DIAGNOSIS — M25511 Pain in right shoulder: Secondary | ICD-10-CM | POA: Diagnosis not present

## 2015-12-11 DIAGNOSIS — M4186 Other forms of scoliosis, lumbar region: Secondary | ICD-10-CM | POA: Diagnosis not present

## 2015-12-11 DIAGNOSIS — Z853 Personal history of malignant neoplasm of breast: Secondary | ICD-10-CM | POA: Diagnosis not present

## 2015-12-11 DIAGNOSIS — M489 Spondylopathy, unspecified: Secondary | ICD-10-CM

## 2015-12-11 DIAGNOSIS — N183 Chronic kidney disease, stage 3 unspecified: Secondary | ICD-10-CM

## 2015-12-11 DIAGNOSIS — Z79891 Long term (current) use of opiate analgesic: Secondary | ICD-10-CM | POA: Diagnosis not present

## 2015-12-11 DIAGNOSIS — M4326 Fusion of spine, lumbar region: Secondary | ICD-10-CM

## 2015-12-11 DIAGNOSIS — E871 Hypo-osmolality and hyponatremia: Secondary | ICD-10-CM | POA: Diagnosis not present

## 2015-12-11 DIAGNOSIS — M79604 Pain in right leg: Secondary | ICD-10-CM | POA: Insufficient documentation

## 2015-12-11 DIAGNOSIS — Z5181 Encounter for therapeutic drug level monitoring: Secondary | ICD-10-CM | POA: Diagnosis not present

## 2015-12-11 DIAGNOSIS — Z8673 Personal history of transient ischemic attack (TIA), and cerebral infarction without residual deficits: Secondary | ICD-10-CM | POA: Insufficient documentation

## 2015-12-11 DIAGNOSIS — Z7902 Long term (current) use of antithrombotics/antiplatelets: Secondary | ICD-10-CM | POA: Insufficient documentation

## 2015-12-11 DIAGNOSIS — M47812 Spondylosis without myelopathy or radiculopathy, cervical region: Secondary | ICD-10-CM | POA: Diagnosis not present

## 2015-12-11 DIAGNOSIS — Z9011 Acquired absence of right breast and nipple: Secondary | ICD-10-CM | POA: Insufficient documentation

## 2015-12-11 DIAGNOSIS — M545 Low back pain: Secondary | ICD-10-CM | POA: Diagnosis not present

## 2015-12-11 DIAGNOSIS — M25561 Pain in right knee: Secondary | ICD-10-CM | POA: Insufficient documentation

## 2015-12-11 DIAGNOSIS — I509 Heart failure, unspecified: Secondary | ICD-10-CM | POA: Diagnosis not present

## 2015-12-11 DIAGNOSIS — Z981 Arthrodesis status: Secondary | ICD-10-CM | POA: Diagnosis not present

## 2015-12-11 DIAGNOSIS — G8929 Other chronic pain: Secondary | ICD-10-CM | POA: Diagnosis not present

## 2015-12-11 DIAGNOSIS — M542 Cervicalgia: Secondary | ICD-10-CM | POA: Insufficient documentation

## 2015-12-11 DIAGNOSIS — E119 Type 2 diabetes mellitus without complications: Secondary | ICD-10-CM | POA: Diagnosis not present

## 2015-12-11 DIAGNOSIS — M79641 Pain in right hand: Secondary | ICD-10-CM | POA: Diagnosis not present

## 2015-12-11 DIAGNOSIS — M961 Postlaminectomy syndrome, not elsewhere classified: Secondary | ICD-10-CM | POA: Diagnosis not present

## 2015-12-11 DIAGNOSIS — Z90710 Acquired absence of both cervix and uterus: Secondary | ICD-10-CM | POA: Insufficient documentation

## 2015-12-11 DIAGNOSIS — M5136 Other intervertebral disc degeneration, lumbar region: Secondary | ICD-10-CM | POA: Diagnosis not present

## 2015-12-11 DIAGNOSIS — M79642 Pain in left hand: Secondary | ICD-10-CM | POA: Insufficient documentation

## 2015-12-11 DIAGNOSIS — K219 Gastro-esophageal reflux disease without esophagitis: Secondary | ICD-10-CM | POA: Insufficient documentation

## 2015-12-11 DIAGNOSIS — M79643 Pain in unspecified hand: Secondary | ICD-10-CM

## 2015-12-11 DIAGNOSIS — Z7901 Long term (current) use of anticoagulants: Secondary | ICD-10-CM | POA: Insufficient documentation

## 2015-12-11 DIAGNOSIS — D649 Anemia, unspecified: Secondary | ICD-10-CM | POA: Insufficient documentation

## 2015-12-11 DIAGNOSIS — I129 Hypertensive chronic kidney disease with stage 1 through stage 4 chronic kidney disease, or unspecified chronic kidney disease: Secondary | ICD-10-CM | POA: Diagnosis not present

## 2015-12-11 DIAGNOSIS — M539 Dorsopathy, unspecified: Secondary | ICD-10-CM

## 2015-12-11 DIAGNOSIS — M4806 Spinal stenosis, lumbar region: Secondary | ICD-10-CM | POA: Diagnosis not present

## 2015-12-11 DIAGNOSIS — F1190 Opioid use, unspecified, uncomplicated: Secondary | ICD-10-CM

## 2015-12-11 DIAGNOSIS — M79606 Pain in leg, unspecified: Secondary | ICD-10-CM | POA: Insufficient documentation

## 2015-12-11 DIAGNOSIS — M25562 Pain in left knee: Secondary | ICD-10-CM | POA: Insufficient documentation

## 2015-12-11 DIAGNOSIS — Z9889 Other specified postprocedural states: Secondary | ICD-10-CM | POA: Insufficient documentation

## 2015-12-11 DIAGNOSIS — M2578 Osteophyte, vertebrae: Secondary | ICD-10-CM | POA: Diagnosis not present

## 2015-12-11 DIAGNOSIS — M47816 Spondylosis without myelopathy or radiculopathy, lumbar region: Secondary | ICD-10-CM | POA: Diagnosis not present

## 2015-12-11 DIAGNOSIS — M4180 Other forms of scoliosis, site unspecified: Secondary | ICD-10-CM

## 2015-12-11 DIAGNOSIS — M79605 Pain in left leg: Secondary | ICD-10-CM | POA: Insufficient documentation

## 2015-12-11 DIAGNOSIS — M48061 Spinal stenosis, lumbar region without neurogenic claudication: Secondary | ICD-10-CM

## 2015-12-11 DIAGNOSIS — M4807 Spinal stenosis, lumbosacral region: Secondary | ICD-10-CM | POA: Insufficient documentation

## 2015-12-11 DIAGNOSIS — M4316 Spondylolisthesis, lumbar region: Secondary | ICD-10-CM | POA: Insufficient documentation

## 2015-12-11 DIAGNOSIS — M549 Dorsalgia, unspecified: Secondary | ICD-10-CM | POA: Diagnosis present

## 2015-12-11 DIAGNOSIS — M48062 Spinal stenosis, lumbar region with neurogenic claudication: Secondary | ICD-10-CM

## 2015-12-11 DIAGNOSIS — M069 Rheumatoid arthritis, unspecified: Secondary | ICD-10-CM | POA: Diagnosis not present

## 2015-12-11 DIAGNOSIS — IMO0001 Reserved for inherently not codable concepts without codable children: Secondary | ICD-10-CM | POA: Insufficient documentation

## 2015-12-11 DIAGNOSIS — R937 Abnormal findings on diagnostic imaging of other parts of musculoskeletal system: Secondary | ICD-10-CM

## 2015-12-11 DIAGNOSIS — Z794 Long term (current) use of insulin: Secondary | ICD-10-CM

## 2015-12-11 DIAGNOSIS — Z79899 Other long term (current) drug therapy: Secondary | ICD-10-CM

## 2015-12-11 DIAGNOSIS — M4310 Spondylolisthesis, site unspecified: Secondary | ICD-10-CM

## 2015-12-11 NOTE — Progress Notes (Signed)
Patient's Name: Kristen Barron  Patient type: New patient  MRN: [id number]  Service setting: Ambulatory outpatient  DOB: Apr 03, 1929  Location: [facility]  DOS: 12/11/2015  Primary Care Physician: Baltazar Apo, MD  Note by: Kathlen Brunswick. Dossie Arbour, M.D, DABA, DABAPM, DABPM, Milagros Evener, FIPP  Referring Physician: Denton Lank, MD  Specialty: Board-Certified Interventional Pain Management     Primary Reason(s) for Visit: Initial Patient Evaluation CC: Back Pain; Leg Pain; and Hand Pain   HPI  Ms. Laverdure is a 82 y.o. year old, female patient, who comes today for an initial evaluation. She has Lumbar central spinal stenosis ( Severe at L3-4) (Mild R>L L2-3 & L5-S1); Hyponatremia; Hypertension; Normocytic anemia; CKD (chronic kidney disease) stage 3, GFR 30-59 ml/min;  Lumbar DDD (degenerative disc disease); Lumbar stenosis with neurogenic claudication; Breast cancer, right, invasive ductal; Chronic pain; Long term current use of opiate analgesic; Encounter for therapeutic drug level monitoring; Chronic knee pain (Location of Primary Source of Pain) (Bilateral) (L>R); Long term prescription opiate use; Opiate use (20 MME/Day); Chronic hand pain (Location of Secondary source of pain) (Bilateral) (L>R); Failed back surgical syndrome x 2 (Surgeon: Dr. Deri Fuelling); Lumbar spondylosis; Chronic low back pain (Location of Tertiary source of pain) (Bilateral) (L>R); Lumbar facet syndrome; Chronic lower extremity pain (Bilateral) (L>R); Chronic neck pain (Left); Cervical spondylosis; Chronic shoulder pain (Right); Insulin dependent diabetes mellitus ([place]); Abnormal MRI, lumbar spine (07/19/2013); Lumbar postlaminectomy syndrome (L4-5); Fusion of lumbar spine (L4-5 Ray cages); Lumbar Levoscoliosis with apex at L4; Grade 1 Retrolisthesis of L2 over L3 and L3 over L4; Lumbar facet arthropathy; Lumbar foraminal stenosis (Bilateral L2-3, L3-4 & Left L5-S1); and Chronic anticoagulation (Plavix) on her  problem list.. Her primarily concern today is the Back Pain; Leg Pain; and Hand Pain   Pain Assessment: Self-Reported Pain Score: 8 , clinically she looks like a 3-4/10. Reported level of pain is not compatible with clinical observations. This symptom exaggeration may be due to malingering, an emotional response, Somatic Symptom Disorder, or a lack of understanding on how the pain scale works.  Today the patient was provided with written information on the pain score. Pain Type: Chronic pain Pain Location: Back Pain Orientation: Lower Pain Descriptors / Indicators: Aching, Throbbing, Burning Pain Frequency: Constant  Onset and Duration: Gradual, Date of onset: 5 years ago and Present longer than 3 months Cause of pain: Arthritis Severity: Getting worse, NAS-11 at its worse: 8/10, NAS-11 at its best: 5/10, NAS-11 now: 6/10 and NAS-11 on the average: 6/10 Timing: During activity or exercise Aggravating Factors: Bending, Kneeling, Squatting, Stooping  and Walking Alleviating Factors: Medications and Sitting Associated Problems: Inability to control bladder (urine), Swelling, Pain that wakes patient up and Pain that does not allow patient to sleep Quality of Pain: Aching and Constant Previous Examinations or Tests: Biopsy, CT scan, MRI scan and X-rays Previous Treatments: Trigger point injections  The patient comes into the clinics today for the first time indicating that her primary pain is that of the knees with the left one being worst on the right. She indicates that Dr. Trenton Gammon did one injection for her which did provide her with some relief of the pain, but it wore off. Next is her hand pain which is also bilateral but with the left being worst on the right. Following this is her low back pain with the left being worst on the right. Next is the lower extremity pain with the left being worst on the right. In both  instances the pain runs down from the knee down to the ankle in the case of the  left leg and down to mid shin on the right leg. The patient denies any pain into her feet. The patient's next worst pain is that of the neck on the left side, followed by the shoulder and her right side.  The patient has a history significant for breast cancer on the right side. She underwent a mastectomy approximately 1-1/2 years ago. In addition, the patient indicates that Dr. Trenton Gammon has done to back surgeries on her.  Historic Controlled Substance Pharmacotherapy Review  Previously Prescribed Opioids: The patient initially started with hydrocodone/APAP 5/325 every 8 hours. Currently Prescribed Analgesic: Hydrocodone/APAP 5/325 one every 6 hours (20 mg/day of hydrocodone) Medications: Patient brought medications to be checked, as requested MME/day: 20 mg/day Pharmacodynamics: Analgesic Effect: More than 50% Activity Facilitation: Medication(s) allow patient to sit, stand, walk, and do the basic ADLs Perceived Effectiveness: Described as relatively effective, allowing for increase in activities of daily living (ADL) Side-effects or Adverse reactions: None reported Historical Background Evaluation: [place] PDMP: Five (5) year initial data search conducted. No abnormal patterns identified [place] [employer] Offender Public Information: Non-contributory Historical Hospital-associated UDS Results:  No results found for: THCU, COCAINSCRNUR, PCPSCRNUR, MDMA, AMPHETMU, METHADONE, ETOH UDS Results: No UDS results available at this time UDS Interpretation: N/A Medication Assessment Form: Not applicable. Initial evaluation. The patient has not received any medications from our practice Treatment compliance: Not applicable. Initial evaluation Risk Assessment: Aberrant Behavior: None observed or detected today Opioid Fatal Overdose Risk Factors: None identified today Non-fatal overdose hazard ratio (HR): 1.44 for 20-49 MME/day Fatal overdose hazard ratio (HR): 1.32 for 20-49 MME/day Substance  Use Disorder (SUD) Risk Level: Pending results of Medical Psychology Evaluation for SUD Opioid Risk Tool (ORT) Score: Total Score: 0 Low Risk for SUD (Score <3) Depression Scale Score: PHQ-2: PHQ-2 Total Score: 0 No depression (0) PHQ-9: PHQ-9 Total Score: 0 No depression (0-4)  Pharmacologic Plan: Pending ordered tests and/or consults  Meds  The patient has a current medication list which includes the following prescription(s): acetaminophen, clopidogrel, gabapentin, glucosamine-chondroit-vit c-mn, hydralazine, hydrocodone-acetaminophen, insulin aspart, insulin detemir, magnesium oxide, melatonin, metoprolol succinate, omega 3, pantoprazole, pravastatin, and torsemide.  ROS  Cardiovascular History: Hypertension, Congestive heart failure and Blood thinners:  Anticoagulant (Plavix) Pulmonary or Respiratory History: Snoring  Neurological History: Stroke (Residual deficits or weakness: None described) and Incontinence:  Urinary Review of Past Neurological Studies: No results found for this or any previous visit. Psychological-Psychiatric History: Negative for anxiety, depression, schizophrenia, bipolar disorders or suicidal ideations or attempts Gastrointestinal History: Reflux or heatburn Genitourinary History: Kidney disease Hematological History: Brusing easily Endocrine History: Insulin-dependent diabetes mellitus Rheumatologic History: Osteoarthritis and Rheumatoid arthritis Musculoskeletal History: Negative for myasthenia gravis, muscular dystrophy, multiple sclerosis or malignant hyperthermia Work History: Retired  Allergies  Ms. Levengood has No Known Allergies.  [place]  Medical:  Ms. Gelpi  has a past medical history of Hypertension; CHF (congestive heart failure) ([place]); Stroke [facility]); Cancer ([place]); Renal disorder; Diabetes ([place]); Hemorrhoids; Arthritis; and Breast cancer ([place]) (03/06/2014). Family: family history includes Breast cancer in her other; Breast cancer (age of onset: 62) in  her sister; Breast cancer (age of onset: 49) in her maternal aunt; Cancer in her brother, maternal aunt, other, and sister; Cancer (age of onset: 5) in her sister. Surgical:  has past surgical history that includes Abdominal hysterectomy; Back surgery; Hand surgery (Right); Lumbar laminectomy/decompression microdiscectomy (Bilateral, 07/24/2013); Breast surgery (  Right, 03/06/14); Mastectomy (Right, 03/06/2014); Breast biopsy (Right, February 15 2014); and Breast biopsy (Right, 1999). Tobacco:  reports that she has never smoked. She has never used smokeless tobacco. Alcohol:  reports that she does not drink alcohol. Drug:  reports that she does not use illicit drugs. Active Ambulatory Problems    Diagnosis Date Noted  . Lumbar central spinal stenosis ( Severe at L3-4) (Mild R>L L2-3 & L5-S1) 07/20/2013  . Hyponatremia 07/20/2013  . Hypertension 07/20/2013  . Normocytic anemia 07/20/2013  . CKD (chronic kidney disease) stage 3, GFR 30-59 ml/min 07/20/2013  .  Lumbar DDD (degenerative disc disease) 07/20/2013  . Lumbar stenosis with neurogenic claudication 07/24/2013  . Breast cancer, right, invasive ductal 02/28/2014  . Chronic pain 12/11/2015  . Long term current use of opiate analgesic 12/11/2015  . Encounter for therapeutic drug level monitoring 12/11/2015  . Chronic knee pain (Location of Primary Source of Pain) (Bilateral) (L>R) 12/11/2015  . Long term prescription opiate use 12/11/2015  . Opiate use (20 MME/Day) 12/11/2015  . Chronic hand pain (Location of Secondary source of pain) (Bilateral) (L>R) 12/11/2015  . Failed back surgical syndrome x 2 (Surgeon: Dr. Deri Fuelling) 12/11/2015  . Lumbar spondylosis 12/11/2015  . Chronic low back pain (Location of Tertiary source of pain) (Bilateral) (L>R) 12/11/2015  . Lumbar facet syndrome 12/11/2015  . Chronic lower extremity pain (Bilateral) (L>R) 12/11/2015  . Chronic neck pain (Left) 12/11/2015  . Cervical spondylosis 12/11/2015  . Chronic  shoulder pain (Right) 12/11/2015  . Insulin dependent diabetes mellitus ([place]) 12/11/2015  . Abnormal MRI, lumbar spine (07/19/2013) 12/11/2015  . Lumbar postlaminectomy syndrome (L4-5) 12/11/2015  . Fusion of lumbar spine (L4-5 Ray cages) 12/11/2015  . Lumbar Levoscoliosis with apex at L4 12/11/2015  . Grade 1 Retrolisthesis of L2 over L3 and L3 over L4 12/11/2015  . Lumbar facet arthropathy 12/11/2015  . Lumbar foraminal stenosis (Bilateral L2-3, L3-4 & Left L5-S1) 12/11/2015  . Chronic anticoagulation (Plavix) 12/11/2015   Resolved Ambulatory Problems    Diagnosis Date Noted  . No Resolved Ambulatory Problems   Past Medical History  Diagnosis Date  . CHF (congestive heart failure) ([place])   . Stroke ([place])   . Cancer ([place])   . Renal disorder   . Diabetes ([place])   . Hemorrhoids   . Arthritis     Constitutional Exam  Vitals: Blood pressure 160/52, pulse 65, temperature 98.4 F (36.9 C), temperature source Oral, resp. rate 18, height 5\' 1"  (1.549 m), weight 198 lb (89.812 kg), SpO2 97 %. General appearance: Well nourished, well developed, and well hydrated. In no acute distress Calculated BMI/Body habitus: Body mass index is 37.43 kg/(m^2). (35-39.9 kg/m2) Severe obesity (Class II) - 136% higher incidence of chronic pain Psych/Mental status: Alert and oriented x 3 (person, place, & time) Eyes: PERLA Respiratory: No evidence of acute respiratory distress  Cervical Spine Exam  Inspection: No masses, redness, or swelling Alignment: Symmetrical ROM: Functional: Unrestricted ROM Active: Adequate ROM Stability: No instability detected Muscle strength & Tone: Functionally intact Sensory: Unimpaired Palpation: No complaints of tenderness  Upper Extremity (UE) Exam    Side: Right upper extremity  Side: Left upper extremity  Inspection: No masses, redness, swelling, or asymmetry  Inspection: No masses, redness, swelling, or asymmetry  ROM:  ROM:  Functional: Unrestricted ROM   Functional: Unrestricted ROM  Active: Adequate ROM  Active: Adequate ROM  Muscle strength & Tone: Functionally intact  Muscle strength & Tone: Functionally intact  Sensory: Unimpaired  Sensory: Unimpaired  Palpation: Non-contributory  Palpation: Non-contributory   Thoracic Spine Exam  Inspection: No masses, redness, or swelling Alignment: Symmetrical ROM: Functional: Unrestricted ROM Active: Adequate ROM Stability: No instability detected Sensory: Unimpaired Muscle strength & Tone: Functionally intact Palpation: No complaints of tenderness  Lumbar Spine Exam  Inspection: Well healed scar from previous spine surgery detected Alignment: Scoliosis detected ROM: Functional: Limited ROM Active: Decreased ROM Stability: No instability detected Muscle strength & Tone: Functionally intact Sensory: Unimpaired Palpation: Tender Provocative Tests: Lumbar Hyperextension and rotation test: deferred Patrick's Maneuver: deferred  Gait & Posture Assessment  Gait: Patient ambulates using a walker Posture: Difficulty with positional changes  Lower Extremity Exam    Side: Right lower extremity  Side: Left lower extremity  Inspection: No masses, redness, swelling, or asymmetry ROM:  Inspection: No masses, redness, swelling, or asymmetry ROM:  Functional: Unrestricted ROM  Functional: Unrestricted ROM  Active: Adequate ROM  Active: Adequate ROM  Muscle strength & Tone: Functionally intact  Muscle strength & Tone: Functionally intact  Sensory: Unimpaired  Sensory: Unimpaired  Palpation: Non-contributory  Palpation: Non-contributory   Assessment  Primary Diagnosis & Pertinent Problem List: The primary encounter diagnosis was Chronic pain. Diagnoses of Long term current use of opiate analgesic, Encounter for therapeutic drug level monitoring, Lumbar stenosis with neurogenic claudication, Chronic knee pain (Location of Primary Source of Pain) (Bilateral) (L>R), Long term prescription opiate  use, Opiate use (20 MME/Day), Chronic hand pain, unspecified laterality, Failed back surgical syndrome x 2 (Surgeon: Dr. Deri Fuelling), Lumbar spondylosis, unspecified spinal osteoarthritis, Chronic low back pain (Location of Tertiary source of pain) (Bilateral) (L>R), Lumbar facet syndrome, Chronic pain of lower extremity, unspecified laterality, Chronic neck pain (Left), Cervical spondylosis, Chronic shoulder pain (Right), CKD (chronic kidney disease) stage 3, GFR 30-59 ml/min, Insulin dependent diabetes mellitus ([place]), Abnormal MRI, lumbar spine (07/19/2013), Lumbar postlaminectomy syndrome (L4-5), Fusion of lumbar spine (L4-5 Ray cages), Lumbar Levoscoliosis with apex at L4, Grade 1 Retrolisthesis of L2 over L3 and L3 over L4, Lumbar facet arthropathy, Lumbar foraminal stenosis (Bilateral L2-3, L3-4 & Left L5-S1), Lumbar central spinal stenosis ( Severe at L3-4), and Chronic anticoagulation (Plavix) were also pertinent to this visit.  Visit Diagnosis: 1. Chronic pain   2. Long term current use of opiate analgesic   3. Encounter for therapeutic drug level monitoring   4. Lumbar stenosis with neurogenic claudication   5. Chronic knee pain (Location of Primary Source of Pain) (Bilateral) (L>R)   6. Long term prescription opiate use   7. Opiate use (20 MME/Day)   8. Chronic hand pain, unspecified laterality   9. Failed back surgical syndrome x 2 (Surgeon: Dr. Deri Fuelling)   10. Lumbar spondylosis, unspecified spinal osteoarthritis   11. Chronic low back pain (Location of Tertiary source of pain) (Bilateral) (L>R)   12. Lumbar facet syndrome   13. Chronic pain of lower extremity, unspecified laterality   14. Chronic neck pain (Left)   15. Cervical spondylosis   16. Chronic shoulder pain (Right)   17. CKD (chronic kidney disease) stage 3, GFR 30-59 ml/min   18. Insulin dependent diabetes mellitus ([place])   19. Abnormal MRI, lumbar spine (07/19/2013)   20. Lumbar postlaminectomy syndrome (L4-5)    21. Fusion of lumbar spine (L4-5 Ray cages)   22. Lumbar Levoscoliosis with apex at L4   23. Grade 1 Retrolisthesis of L2 over L3 and L3 over L4   24. Lumbar facet arthropathy   25. Lumbar foraminal stenosis (Bilateral L2-3, L3-4 &  Left L5-S1)   26. Lumbar central spinal stenosis ( Severe at L3-4)   27. Chronic anticoagulation (Plavix)     Assessment: Abnormal MRI, lumbar spine (07/19/2013) FINDINGS: L4-5 Ray cage fusion. Mild levoconvex curve with the apex at L4. Grade I retrolisthesis of L2 on L3 and L3 on L4 T11-12 broad-based disc bulging.  T11-12 and T12-L1 show disc desiccation and degeneration. L1-2: Tiny central protrusion L2-3: Mild central stenosis associated with broad-based posterior disc bulging and more prominent endplate osteophytes. Mild bilateral foraminal stenosis secondary to the osteophytes. Facet hypertrophy and ligamentum flavum redundancy contributes to the mild central stenosis. Right-greater-than-left lateral recess crowding.  L3-4: Severe central stenosis with obliteration of the subarachnoid space and lateral recesses. Large broad-based disc extrusion with both cranial and caudal migration of disc material. Caudal migration is greater, measuring 9 mm. Moderate bilateral foraminal stenosis is present. There is bunching of the nerve roots proximal to the severe central stenosis. Loss of disc height with circumferential bulging in addition to the central extrusion. Degenerative endplate edema is present and there is Left facet arthritis with periarticular inflammatory changes.  L4-5: Wide posterior decompression. Fusion is likely solid. No recurrent stenosis.  L5-S1: Disc degeneration with broad-based bulging and endplate osteophytes. Mild left foraminal stenosis secondary to osteophytes and bulging disc. Right foramen appears patent. Central stenosis is mild and there is right greater than left lateral recess stenosis associated with osteophytes. IMPRESSION:  1.  Uncomplicated 99991111 Ray cage fusion.  2. 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 Severe spinal, lateral recess and moderate bilateral foraminal stenosis.  3. Moderate L2-3 degenerative disease with mild central and right lateral recess stenosis. Mild bilateral foraminal stenosis.  4. L5-S1 severe degenerative disease with mild central stenosis and right greater than left lateral recess stenosis. Left foraminal stenosis potentially affects the left L5 nerve.    Plan of Care  Initial Treatment Plan:  Please be advised that as per protocol, today's visit has been an evaluation only. We have not taken over the patient's controlled substance management.  Problem List Items Addressed This Visit      High   Abnormal MRI, lumbar spine (07/19/2013)    FINDINGS: L4-5 Ray cage fusion. Mild levoconvex curve with the apex at L4. Grade I retrolisthesis of L2 on L3 and L3 on L4 T11-12 broad-based disc bulging.  T11-12 and T12-L1 show disc desiccation and degeneration. L1-2: Tiny central protrusion L2-3: Mild central stenosis associated with broad-based posterior disc bulging and more prominent endplate osteophytes. Mild bilateral foraminal stenosis secondary to the osteophytes. Facet hypertrophy and ligamentum flavum redundancy contributes to the mild central stenosis. Right-greater-than-left lateral recess crowding.  L3-4: Severe central stenosis with obliteration of the subarachnoid space and lateral recesses. Large broad-based disc extrusion with both cranial and caudal migration of disc material. Caudal migration is greater, measuring 9 mm. Moderate bilateral foraminal stenosis is present. There is bunching of the nerve roots proximal to the severe central stenosis. Loss of disc height with circumferential bulging in addition to the central extrusion. Degenerative endplate edema is present and there is Left facet arthritis with periarticular inflammatory changes.  L4-5: Wide posterior decompression.  Fusion is likely solid. No recurrent stenosis.  L5-S1: Disc degeneration with broad-based bulging and endplate osteophytes. Mild left foraminal stenosis secondary to osteophytes and bulging disc. Right foramen appears patent. Central stenosis is mild and there is right greater than left lateral recess stenosis associated with osteophytes. IMPRESSION:  1. Uncomplicated 99991111 Ray cage fusion.  2. Severe L3-4 adjacent segment  disease. Severe spinal, lateral recess and moderate bilateral foraminal stenosis.  3. Moderate L2-3 degenerative disease with mild central and right lateral recess stenosis. Mild bilateral foraminal stenosis.  4. L5-S1 severe degenerative disease with mild central stenosis and right greater than left lateral recess stenosis. Left foraminal stenosis potentially affects the left L5 nerve.      Cervical spondylosis (Chronic)   Relevant Medications   acetaminophen (TYLENOL) 500 MG tablet   Chronic hand pain (Location of Secondary source of pain) (Bilateral) (L>R) (Chronic)   Relevant Medications   acetaminophen (TYLENOL) 500 MG tablet   Chronic knee pain (Location of Primary Source of Pain) (Bilateral) (L>R) (Chronic)   Relevant Medications   acetaminophen (TYLENOL) 500 MG tablet   Other Relevant Orders   KNEE INJECTION   Chronic low back pain (Location of Tertiary source of pain) (Bilateral) (L>R) (Chronic)   Relevant Medications   acetaminophen (TYLENOL) 500 MG tablet   Chronic lower extremity pain (Bilateral) (L>R) (Chronic)   Chronic neck pain (Left) (Chronic)   Relevant Medications   acetaminophen (TYLENOL) 500 MG tablet   Chronic pain - Primary (Chronic)   Relevant Medications   acetaminophen (TYLENOL) 500 MG tablet   Other Relevant Orders   Comprehensive metabolic panel   C-reactive protein   Magnesium   Sedimentation rate   Vitamin B12   Vitamin D 1,25 dihydroxy   Chronic shoulder pain (Right) (Chronic)   Failed back surgical syndrome x 2 (Surgeon: Dr.  Deri Fuelling) (Chronic)   Relevant Medications   acetaminophen (TYLENOL) 500 MG tablet   Fusion of lumbar spine (L4-5 Ray cages) (Chronic)   Grade 1 Retrolisthesis of L2 over L3 and L3 over L4 (Chronic)   Lumbar central spinal stenosis ( Severe at L3-4) (Mild R>L L2-3 & L5-S1) (Chronic)   Lumbar facet arthropathy (Chronic)   Lumbar facet syndrome (Chronic)   Relevant Medications   acetaminophen (TYLENOL) 500 MG tablet   Lumbar foraminal stenosis (Bilateral L2-3, L3-4 & Left L5-S1) (Chronic)   Lumbar Levoscoliosis with apex at L4 (Chronic)   Lumbar postlaminectomy syndrome (L4-5) (Chronic)   Lumbar spondylosis (Chronic)   Relevant Medications   acetaminophen (TYLENOL) 500 MG tablet   Lumbar stenosis with neurogenic claudication   Relevant Orders   LUMBAR EPIDURAL STEROID INJECTION     Medium   Chronic anticoagulation (Plavix)   Encounter for therapeutic drug level monitoring   Long term current use of opiate analgesic (Chronic)   Relevant Orders   Compliance Drug Analysis, Ur   Ambulatory referral to Psychology   Long term prescription opiate use (Chronic)   Opiate use (20 MME/Day) (Chronic)     Low   CKD (chronic kidney disease) stage 3, GFR 30-59 ml/min (Chronic)   Insulin dependent diabetes mellitus ([facility]) (Chronic)   Relevant Medications   insulin aspart (NOVOLOG) 100 UNIT/ML injection   Insulin Detemir (LEVEMIR FLEXTOUCH) 100 UNIT/ML Pen      Pharmacotherapy (Medications Ordered): No orders of the defined types were placed in this encounter.    Lab-work & Procedure Ordered: Orders Placed This Encounter  Procedures  . KNEE INJECTION  . LUMBAR EPIDURAL STEROID INJECTION  . Compliance Drug Analysis, Ur  . Comprehensive metabolic panel  . C-reactive protein  . Magnesium  . Sedimentation rate  . Vitamin B12  . Vitamin D 1,25 dihydroxy  . Ambulatory referral to Psychology    Imaging Ordered: AMB REFERRAL TO PSYCHOLOGY  Interventional Therapies: Scheduled:  Bilateral intra-articular Hyalgan knee injections + left caudal epidural steroid  injection under fluoroscopic guidance and IV sedation. Stop Plavix for 10 days prior to procedure. Considering: Transforaminal versus interlaminar lumbar epidural steroid injections. PRN Procedures: None at this time.   Referral(s) or Consult(s): Medical psychology consult for substance use disorder evaluation  Medications administered during this visit: Ms. Holifield had no medications administered during this visit.  Prescriptions ordered during this visit: New Prescriptions   No medications on file    Requested PM Follow-up: Return for Procedure (ASAP), Return after [facility] (SUD) Eval for medication management..  Future Appointments Date Time Provider [place]  02/19/2016 1:00 PM [facility] 1 [facility] [facility]  03/11/2016 11:15 AM [facility] None  03/11/2016 11:30 AM Forest Gleason, MD [facility] None     Primary Care Physician: Baltazar Apo, MD Location: [facility] Note by: Kathlen Brunswick. Dossie Arbour, M.D, DABA, DABAPM, DABPM, DABIPP, FIPP  Pain Score Disclaimer: We use the NRS-11 scale. This is a self-reported, subjective measurement of pain severity with only modest accuracy. It is used primarily to identify changes within a particular patient. It must be understood that outpatient pain scales are significantly less accurate that those used for research, where they can be applied under ideal controlled circumstances with minimal exposure to variables. In reality, the score is likely to be a combination of pain intensity and pain affect, where pain affect describes the degree of emotional arousal or changes in action readiness caused by the sensory experience of pain. Factors such as social and work situation, setting, emotional state, anxiety levels, expectation, and prior pain experience may influence pain perception and show large inter-individual differences that may  also be affected by time variables.

## 2015-12-11 NOTE — Patient Instructions (Signed)
Epidural Steroid Injection An epidural steroid injection is given to relieve pain in your neck, back, or legs that is caused by the irritation or swelling of a nerve root. This procedure involves injecting a steroid and numbing medicine (anesthetic) into the epidural space. The epidural space is the space between the outer covering of your spinal cord and the bones that form your backbone (vertebra).  LET [facility] CARE PROVIDER KNOW ABOUT:  Any allergies you have. All medicines you are taking, including vitamins, herbs, eye drops, creams, and over-the-counter medicines such as aspirin. Previous problems you or members of your family have had with the use of anesthetics. Any blood disorders or blood clotting disorders you have. Previous surgeries you have had. Medical conditions you have. RISKS AND COMPLICATIONS Generally, this is a safe procedure. However, as with any procedure, complications can occur. Possible complications of epidural steroid injection include: Headache. Bleeding. Infection. Allergic reaction to the medicines. Damage to your nerves. The response to this procedure depends on the underlying cause of the pain and its duration. People who have long-term (chronic) pain are less likely to benefit from epidural steroids than are those people whose pain comes on strong and suddenly. BEFORE THE PROCEDURE  Ask your health care provider about changing or stopping your regular medicines. You may be advised to stop taking blood-thinning medicines a few days before the procedure. You may be given medicines to reduce anxiety. Arrange for someone to take you home after the procedure. PROCEDURE  You will remain awake during the procedure. You may receive medicine to make you relaxed. You will be asked to lie on your stomach. The injection site will be cleaned. The injection site will be numbed with a medicine (local anesthetic). A needle will be injected through your skin into the  epidural space. Your health care provider will use an X-ray machine to ensure that the steroid is delivered closest to the affected nerve. You may have minimal discomfort at this time. Once the needle is in the right position, the local anesthetic and the steroid will be injected into the epidural space. The needle will then be removed and a bandage will be applied to the injection site. AFTER THE PROCEDURE  You may be monitored for a short time before you go home. You may feel weakness or numbness in your arm or leg, which disappears within hours. You may be allowed to eat, drink, and take your regular medicine. You may have soreness at the site of the injection.   This information is not intended to replace advice given to you by your health care provider. Make sure you discuss any questions you have with your health care provider.   Document Released: 11/03/2007 Document Revised: 03/29/2013 Document Reviewed: 01/13/2013 Elsevier Interactive Patient Education 2016 [employer] Knee Injection A knee injection is a procedure to get medicine into your knee joint. Your health care provider puts a needle into the joint and injects medicine with an attached syringe. The injected medicine may relieve the pain, swelling, and stiffness of arthritis. The injected medicine may also help to lubricate and cushion your knee joint. You may need more than one injection. LET [facility] CARE PROVIDER KNOW ABOUT:  Any allergies you have.  All medicines you are taking, including vitamins, herbs, eye drops, creams, and over-the-counter medicines.  Previous problems you or members of your family have had with the use of anesthetics.  Any blood disorders you have.  Previous surgeries you have had.  Any  medical conditions you may have. RISKS AND COMPLICATIONS Generally, this is a safe procedure. However, problems may occur, including:  Infection.  Bleeding.  Worsening symptoms.  Damage to the area  around your knee.  Allergic reaction to any of the medicines.  Skin reactions from repeated injections. BEFORE THE PROCEDURE  Ask your health care provider about changing or stopping your regular medicines. This is especially important if you are taking diabetes medicines or blood thinners.  Plan to have someone take you home after the procedure. PROCEDURE  You will sit or lie down in a position for your knee to be treated.  The skin over your kneecap will be cleaned with a germ-killing solution (antiseptic).  You will be given a medicine that numbs the area (local anesthetic). You may feel some stinging.  After your knee becomes numb, you will have a second injection. This is the medicine. This needle is carefully placed between your kneecap and your knee. The medicine is injected into the joint space.  At the end of the procedure, the needle will be removed.  A bandage (dressing) may be placed over the injection site. The procedure may vary among health care providers and hospitals. AFTER THE PROCEDURE  You may have to move your knee through its full range of motion. This helps to get all of the medicine into your joint space.  Your blood pressure, heart rate, breathing rate, and blood oxygen level will be monitored often until the medicines you were given have worn off.  You will be watched to make sure that you do not have a reaction to the injected medicine.   This information is not intended to replace advice given to you by your health care provider. Make sure you discuss any questions you have with your health care provider.   Document Released: 10/18/2006 Document Revised: 08/17/2014 Document Reviewed: 06/06/2014 Elsevier Interactive Patient Education 2016 Andrew  What are the risk, side effects and possible complications? Generally speaking, most procedures are safe.  However, with any procedure there are risks, side effects,  and the possibility of complications.  The risks and complications are dependent upon the sites that are lesioned, or the type of nerve block to be performed.  The closer the procedure is to the spine, the more serious the risks are.  Great care is taken when placing the radio frequency needles, block needles or lesioning probes, but sometimes complications can occur.  Infection: Any time there is an injection through the skin, there is a risk of infection.  This is why sterile conditions are used for these blocks.  There are four possible types of infection.  Localized skin infection.  Central Nervous System Infection-This can be in the form of Meningitis, which can be deadly.  Epidural Infections-This can be in the form of an epidural abscess, which can cause pressure inside of the spine, causing compression of the spinal cord with subsequent paralysis. This would require an emergency surgery to decompress, and there are no guarantees that the patient would recover from the paralysis.  Discitis-This is an infection of the intervertebral discs.  It occurs in about 1% of discography procedures.  It is difficult to treat and it may lead to surgery.        2. Pain: the needles have to go through skin and soft tissues, will cause soreness.       3. Damage to internal structures:  The nerves to be lesioned may be near  blood vessels or    other nerves which can be potentially damaged.       4. Bleeding: Bleeding is more common if the patient is taking blood thinners such as  aspirin, Coumadin, Ticiid, Plavix, etc., or if he/she have some genetic predisposition  such as hemophilia. Bleeding into the spinal canal can cause compression of the spinal  cord with subsequent paralysis.  This would require an emergency surgery to  decompress and there are no guarantees that the patient would recover from the  paralysis.       5. Pneumothorax:  Puncturing of a lung is a possibility, every time a needle is  introduced in  the area of the chest or upper back.  Pneumothorax refers to free air around the  collapsed lung(s), inside of the thoracic cavity (chest cavity).  Another two possible  complications related to a similar event would include: Hemothorax and Chylothorax.   These are variations of the Pneumothorax, where instead of air around the collapsed  lung(s), you may have blood or chyle, respectively.       6. Spinal headaches: They may occur with any procedures in the area of the spine.       7. Persistent CSF (Cerebro-Spinal Fluid) leakage: This is a rare problem, but may occur  with prolonged intrathecal or epidural catheters either due to the formation of a fistulous  track or a dural tear.       8. Nerve damage: By working so close to the spinal cord, there is always a possibility of  nerve damage, which could be as serious as a permanent spinal cord injury with  paralysis.       9. Death:  Although rare, severe deadly allergic reactions known as "Anaphylactic  reaction" can occur to any of the medications used.      10. Worsening of the symptoms:  We can always make thing worse.  What are the chances of something like this happening? Chances of any of this occuring are extremely low.  By statistics, you have more of a chance of getting killed in a motor vehicle accident: while driving to the hospital than any of the above occurring .  Nevertheless, you should be aware that they are possibilities.  In general, it is similar to taking a shower.  Everybody knows that you can slip, hit your head and get killed.  Does that mean that you should not shower again?  Nevertheless always keep in mind that statistics do not mean anything if you happen to be on the wrong side of them.  Even if a procedure has a 1 (one) in a 1,000,000 (million) chance of going wrong, it you happen to be that one..Also, keep in mind that by statistics, you have more of a chance of having something go wrong when taking  medications.  Who should not have this procedure? If you are on a blood thinning medication (e.g. Coumadin, Plavix, see list of "Blood Thinners"), or if you have an active infection going on, you should not have the procedure.  If you are taking any blood thinners, please inform your physician.  How should I prepare for this procedure?  Do not eat or drink anything at least six hours prior to the procedure.  Bring a driver with you .  It cannot be a taxi.  Come accompanied by an adult that can drive you back, and that is strong enough to help you if your legs get weak or numb  from the local anesthetic.  Take all of your medicines the morning of the procedure with just enough water to swallow them.  If you have diabetes, make sure that you are scheduled to have your procedure done first thing in the morning, whenever possible.  If you have diabetes, take only half of your insulin dose and notify our nurse that you have done so as soon as you arrive at the clinic.  If you are diabetic, but only take blood sugar pills (oral hypoglycemic), then do not take them on the morning of your procedure.  You may take them after you have had the procedure.  Do not take aspirin or any aspirin-containing medications, at least eleven (11) days prior to the procedure.  They may prolong bleeding.  Wear loose fitting clothing that may be easy to take off and that you would not mind if it got stained with Betadine or blood.  Do not wear any jewelry or perfume  Remove any nail coloring.  It will interfere with some of our monitoring equipment.  NOTE: Remember that this is not meant to be interpreted as a complete list of all possible complications.  Unforeseen problems may occur.  BLOOD THINNERS The following drugs contain aspirin or other products, which can cause increased bleeding during surgery and should not be taken for 2 weeks prior to and 1 week after surgery.  If you should need take something for  relief of minor pain, you may take acetaminophen which is found in Tylenol,m Datril, Anacin-3 and Panadol. It is not blood thinner. The products listed below are.  Do not take any of the products listed below in addition to any listed on your instruction sheet.  A.P.C or A.P.C with Codeine Codeine Phosphate Capsules #3 Ibuprofen Ridaura  ABC compound Congesprin Imuran rimadil  Advil Cope Indocin Robaxisal  Alka-Seltzer Effervescent Pain Reliever and Antacid Coricidin or Coricidin-D  Indomethacin Rufen  Alka-Seltzer plus Cold Medicine Cosprin Ketoprofen S-A-C Tablets  Anacin Analgesic Tablets or Capsules Coumadin Korlgesic Salflex  Anacin Extra Strength Analgesic tablets or capsules CP-2 Tablets Lanoril Salicylate  Anaprox Cuprimine Capsules Levenox Salocol  Anexsia-D Dalteparin Magan Salsalate  Anodynos Darvon compound Magnesium Salicylate Sine-off  Ansaid Dasin Capsules Magsal Sodium Salicylate  Anturane Depen Capsules Marnal Soma  APF Arthritis pain formula Dewitt's Pills Measurin Stanback  Argesic Dia-Gesic Meclofenamic Sulfinpyrazone  Arthritis Bayer Timed Release Aspirin Diclofenac Meclomen Sulindac  Arthritis pain formula Anacin Dicumarol Medipren Supac  Analgesic (Safety coated) Arthralgen Diffunasal Mefanamic Suprofen  Arthritis Strength Bufferin Dihydrocodeine Mepro Compound Suprol  Arthropan liquid Dopirydamole Methcarbomol with Aspirin Synalgos  ASA tablets/Enseals Disalcid Micrainin Tagament  Ascriptin Doan's Midol Talwin  Ascriptin A/D Dolene Mobidin Tanderil  Ascriptin Extra Strength Dolobid Moblgesic Ticlid  Ascriptin with Codeine Doloprin or Doloprin with Codeine Momentum Tolectin  Asperbuf Duoprin Mono-gesic Trendar  Aspergum Duradyne Motrin or Motrin IB Triminicin  Aspirin plain, buffered or enteric coated Durasal Myochrisine Trigesic  Aspirin Suppositories Easprin Nalfon Trillsate  Aspirin with Codeine Ecotrin Regular or Extra Strength Naprosyn Uracel  Atromid-S  Efficin Naproxen Ursinus  Auranofin Capsules Elmiron Neocylate Vanquish  Axotal Emagrin Norgesic Verin  Azathioprine Empirin or Empirin with Codeine Normiflo Vitamin E  Azolid Emprazil Nuprin Voltaren  Bayer Aspirin plain, buffered or children's or timed BC Tablets or powders Encaprin Orgaran Warfarin Sodium  Buff-a-Comp Enoxaparin Orudis Zorpin  Buff-a-Comp with Codeine Equegesic Os-Cal-Gesic   Buffaprin Excedrin plain, buffered or Extra Strength Oxalid   Bufferin Arthritis Strength Feldene Oxphenbutazone   Bufferin  plain or Extra Strength Feldene Capsules Oxycodone with Aspirin   Bufferin with Codeine Fenoprofen Fenoprofen Pabalate or Pabalate-SF   Buffets II Flogesic Panagesic   Buffinol plain or Extra Strength Florinal or Florinal with Codeine Panwarfarin   Buf-Tabs Flurbiprofen Penicillamine   Butalbital Compound Four-way cold tablets Penicillin   Butazolidin Fragmin Pepto-Bismol   Carbenicillin Geminisyn Percodan   Carna Arthritis Reliever Geopen Persantine   Carprofen Gold's salt Persistin   Chloramphenicol Goody's Phenylbutazone   Chloromycetin Haltrain Piroxlcam   Clmetidine heparin Plaquenil   Cllnoril Hyco-pap Ponstel   Clofibrate Hydroxy chloroquine Propoxyphen         Before stopping any of these medications, be sure to consult the physician who ordered them.  Some, such as Coumadin (Warfarin) are ordered to prevent or treat serious conditions such as "deep thrombosis", "pumonary embolisms", and other heart problems.  The amount of time that you may need off of the medication may also vary with the medication and the reason for which you were taking it.  If you are taking any of these medications, please make sure you notify your pain physician before you undergo any procedures.

## 2015-12-11 NOTE — Assessment & Plan Note (Signed)
FINDINGS: L4-5 Ray cage fusion. Mild levoconvex curve with the apex at L4. Grade I retrolisthesis of L2 on L3 and L3 on L4 T11-12 broad-based disc bulging.  T11-12 and T12-L1 show disc desiccation and degeneration. L1-2: Tiny central protrusion L2-3: Mild central stenosis associated with broad-based posterior disc bulging and more prominent endplate osteophytes. Mild bilateral foraminal stenosis secondary to the osteophytes. Facet hypertrophy and ligamentum flavum redundancy contributes to the mild central stenosis. Right-greater-than-left lateral recess crowding.  L3-4: Severe central stenosis with obliteration of the subarachnoid space and lateral recesses. Large broad-based disc extrusion with both cranial and caudal migration of disc material. Caudal migration is greater, measuring 9 mm. Moderate bilateral foraminal stenosis is present. There is bunching of the nerve roots proximal to the severe central stenosis. Loss of disc height with circumferential bulging in addition to the central extrusion. Degenerative endplate edema is present and there is Left facet arthritis with periarticular inflammatory changes.  L4-5: Wide posterior decompression. Fusion is likely solid. No recurrent stenosis.  L5-S1: Disc degeneration with broad-based bulging and endplate osteophytes. Mild left foraminal stenosis secondary to osteophytes and bulging disc. Right foramen appears patent. Central stenosis is mild and there is right greater than left lateral recess stenosis associated with osteophytes. IMPRESSION:  1. Uncomplicated [id number] Ray cage fusion.  2. 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 Severe spinal, lateral recess and moderate bilateral foraminal stenosis.  3. Moderate L2-3 degenerative disease with mild central and right lateral recess stenosis. Mild bilateral foraminal stenosis.  4. L5-S1 severe degenerative disease with mild central stenosis and right greater than left lateral recess stenosis. Left  foraminal stenosis potentially affects the left L5 nerve.

## 2015-12-11 NOTE — Progress Notes (Signed)
Safety precautions to be maintained throughout the outpatient stay will include: orient to surroundings, keep bed in low position, maintain call bell within reach at all times, provide assistance with transfer out of bed and ambulation.  

## 2015-12-12 ENCOUNTER — Telehealth: Payer: Self-pay | Admitting: Pain Medicine

## 2015-12-12 ENCOUNTER — Other Ambulatory Visit
Admission: RE | Admit: 2015-12-12 | Discharge: 2015-12-12 | Disposition: A | Discharge status: Home / Self Care | Payer: Medicare Other | Source: Ambulatory Visit | Attending: Pain Medicine | Admitting: Pain Medicine

## 2015-12-12 DIAGNOSIS — G8929 Other chronic pain: Secondary | ICD-10-CM | POA: Diagnosis present

## 2015-12-12 LAB — MAGNESIUM: Magnesium: 2.1 mg/dL (ref 1.7–2.4)

## 2015-12-12 LAB — COMPREHENSIVE METABOLIC PANEL
ALK PHOS: 68 U/L (ref 38–126)
ALT: 13 U/L — ABNORMAL LOW (ref 14–54)
ANION GAP: 11 (ref 5–15)
AST: 23 U/L (ref 15–41)
Albumin: 3.5 g/dL (ref 3.5–5.0)
BILIRUBIN TOTAL: 0.6 mg/dL (ref 0.3–1.2)
BUN: 29 mg/dL — ABNORMAL HIGH (ref 6–20)
CALCIUM: 8.7 mg/dL — AB (ref 8.9–10.3)
CO2: 26 mmol/L (ref 22–32)
Chloride: 101 mmol/L (ref 101–111)
Creatinine, Ser: 1.6 mg/dL — ABNORMAL HIGH (ref 0.44–1.00)
GFR, EST AFRICAN AMERICAN: 33 mL/min — AB (ref >60)
GFR, EST NON AFRICAN AMERICAN: 28 mL/min — AB (ref >60)
Glucose, Bld: 68 mg/dL (ref 65–99)
Potassium: 4.1 mmol/L (ref 3.5–5.1)
Sodium: 138 mmol/L (ref 135–145)
TOTAL PROTEIN: 7.1 g/dL (ref 6.5–8.1)

## 2015-12-12 LAB — SEDIMENTATION RATE: Sed Rate: 62 mm/hr — ABNORMAL HIGH (ref 0–30)

## 2015-12-12 LAB — VITAMIN B12: VITAMIN B 12: 267 pg/mL (ref 180–914)

## 2015-12-12 LAB — C-REACTIVE PROTEIN: CRP: 0.7 mg/dL (ref <1.0)

## 2015-12-12 NOTE — Telephone Encounter (Signed)
Should patient stop termacide, its a diuretic, along with the Plavix ? Please call and let her know

## 2015-12-12 NOTE — Telephone Encounter (Signed)
Patient advised she does not have to stop diuretic.

## 2015-12-13 LAB — VITAMIN D 25 HYDROXY (VIT D DEFICIENCY, FRACTURES): Vit D, 25-Hydroxy: 33.8 ng/mL (ref 30.0–100.0)

## 2015-12-15 NOTE — Progress Notes (Signed)
Quick Note:   BUN levels between 7 to 20 mg/dL (2.5 to 7.1 mmol/L) are considered normal. Elevated blood urea nitrogen can also be due to: urinary tract obstruction; congestive heart failure or recent heart attack; gastrointestinal bleeding; dehydration; shock; severe burns; certain medications, such as corticosteroids and some antibiotics; and/or a high protein diet.  Normal Creatinine levels are between 0.5 and 0.9 mg/dl for our lab. Any condition that impairs the function of the kidneys is likely to raise the creatinine level in the blood. The most common causes of longstanding (chronic) kidney disease in adults are high blood pressure and diabetes. Other causes of elevated blood creatinine levels include drugs, ingestion of a large amount of dietary meat, kidney infections, rhabdomyolysis (abnormal muscle breakdown), and urinary tract obstruction.  BUN-to-creatinine ratio >20:1 (BUN dispropertionally higher than the creatinine levels) suggests prerenal azotemia (dehydration or renal hypoperfusion), while <10:1 levels suggest renal damage.  Normal calcium levels are between 9.0 and 10.5 mg/dl. The most common cause of low total calcium is: Low blood protein levels, especially a low level of albumin, which can result from liver disease or malnutrition, both of which may result from alcoholism or other illnesses. Low albumin is also very common in people who are acutely ill. With low albumin, only the bound calcium is low. Ionized calcium remains normal, and calcium metabolism is being regulated appropriately. Some other causes of hypocalcemia include: Underactive parathyroid gland (hypoparathyroidism); Inherited resistance to the effects of parathyroid hormone; Extreme deficiency in dietary calcium; Decreased levels of vitamin D; Magnesium deficiency; Increased levels of phosphorus; Acute inflammation of the pancreas (pancreatitis); & Renal failure.  While most low ALT level results indicate a normal  healthy liver, that may not always be the case. A low-functioning or non-functioning liver, lacking normal levels of ALT activity to begin with, would not release a lot of ALT into the blood when damaged. People infected with the hepatitis C virus initially show high ALT levels in their blood, but these levels fall over time. Because the ALT test measures ALT levels at only one point in time, people with chronic hepatitis C infection may already have experienced the ALT peak well before blood was drawn for the ALT test. Urinary tract infections or malnutrition may also cause low blood ALT levels. eGFR (Estimated Glomerular Filtration Rate) results are reported as milliliters/minute/1.60m (mL/min/1.763m. Because some laboratories do not collect information on a patient's race when the sample is collected for testing, they may report calculated results for both African Americans and non-African Americans.  The NaNation[facility] Mutual Insurance[facility]pecial[facility] LPsuggests only reporting actual results once values are < 60 mL/min. 1. Normal values: 90-120 mL/min 2. Below 60 mL/min suggests that some kidney damage has occurred. 3. Between 5971nd 30 indicate (Moderate) Stage 3 kidney disease. 4. Between 29 and 15 represent (Severe) Stage 4 kidney disease. 5. Less than 15 is considered (Kidney Failure) Stage 5. ______

## 2015-12-15 NOTE — Progress Notes (Signed)

## 2015-12-19 LAB — COMPLIANCE DRUG ANALYSIS, UR

## 2015-12-20 ENCOUNTER — Telehealth: Payer: Self-pay | Admitting: Pain Medicine

## 2015-12-20 NOTE — Telephone Encounter (Signed)
Patient is only coming her for procedures, Dr. Posey Pronto is taking care of her meds, so does she need appt with Dr. Marjory Lies and how soon can she get procedure done? Please call patient or Dr. Posey Pronto at [facility]

## 2015-12-23 NOTE — Telephone Encounter (Signed)
Left message to call office

## 2015-12-24 ENCOUNTER — Telehealth: Payer: Self-pay

## 2015-12-24 NOTE — Telephone Encounter (Signed)
Patient informed that she did not have to go see Dr Marjory Lies if she was not planning on getting medications.

## 2015-12-26 ENCOUNTER — Ambulatory Visit: Payer: Medicare Other | Attending: Pain Medicine | Admitting: Pain Medicine

## 2015-12-26 ENCOUNTER — Encounter: Payer: Self-pay | Admitting: Pain Medicine

## 2015-12-26 VITALS — BP 175/80 | HR 66 | Temp 98.3°F | Resp 16 | Ht 61.0 in | Wt 200.0 lb

## 2015-12-26 DIAGNOSIS — E669 Obesity, unspecified: Secondary | ICD-10-CM | POA: Insufficient documentation

## 2015-12-26 DIAGNOSIS — M545 Low back pain: Secondary | ICD-10-CM | POA: Diagnosis not present

## 2015-12-26 DIAGNOSIS — M25569 Pain in unspecified knee: Secondary | ICD-10-CM | POA: Diagnosis present

## 2015-12-26 DIAGNOSIS — M549 Dorsalgia, unspecified: Secondary | ICD-10-CM | POA: Diagnosis present

## 2015-12-26 DIAGNOSIS — M48062 Spinal stenosis, lumbar region with neurogenic claudication: Secondary | ICD-10-CM

## 2015-12-26 DIAGNOSIS — M47816 Spondylosis without myelopathy or radiculopathy, lumbar region: Secondary | ICD-10-CM | POA: Insufficient documentation

## 2015-12-26 DIAGNOSIS — I129 Hypertensive chronic kidney disease with stage 1 through stage 4 chronic kidney disease, or unspecified chronic kidney disease: Secondary | ICD-10-CM | POA: Diagnosis not present

## 2015-12-26 DIAGNOSIS — M170 Bilateral primary osteoarthritis of knee: Secondary | ICD-10-CM | POA: Diagnosis not present

## 2015-12-26 DIAGNOSIS — M4806 Spinal stenosis, lumbar region: Secondary | ICD-10-CM | POA: Diagnosis not present

## 2015-12-26 DIAGNOSIS — D649 Anemia, unspecified: Secondary | ICD-10-CM | POA: Diagnosis not present

## 2015-12-26 DIAGNOSIS — M47812 Spondylosis without myelopathy or radiculopathy, cervical region: Secondary | ICD-10-CM | POA: Diagnosis not present

## 2015-12-26 DIAGNOSIS — M539 Dorsopathy, unspecified: Secondary | ICD-10-CM

## 2015-12-26 DIAGNOSIS — Z7902 Long term (current) use of antithrombotics/antiplatelets: Secondary | ICD-10-CM | POA: Insufficient documentation

## 2015-12-26 DIAGNOSIS — M79604 Pain in right leg: Secondary | ICD-10-CM | POA: Diagnosis not present

## 2015-12-26 DIAGNOSIS — M25562 Pain in left knee: Secondary | ICD-10-CM | POA: Insufficient documentation

## 2015-12-26 DIAGNOSIS — M79606 Pain in leg, unspecified: Secondary | ICD-10-CM | POA: Diagnosis not present

## 2015-12-26 DIAGNOSIS — M25561 Pain in right knee: Secondary | ICD-10-CM

## 2015-12-26 DIAGNOSIS — G8929 Other chronic pain: Secondary | ICD-10-CM

## 2015-12-26 DIAGNOSIS — M542 Cervicalgia: Secondary | ICD-10-CM | POA: Diagnosis not present

## 2015-12-26 DIAGNOSIS — Z79891 Long term (current) use of opiate analgesic: Secondary | ICD-10-CM | POA: Diagnosis not present

## 2015-12-26 DIAGNOSIS — M79605 Pain in left leg: Secondary | ICD-10-CM | POA: Diagnosis not present

## 2015-12-26 DIAGNOSIS — E871 Hypo-osmolality and hyponatremia: Secondary | ICD-10-CM | POA: Insufficient documentation

## 2015-12-26 DIAGNOSIS — M961 Postlaminectomy syndrome, not elsewhere classified: Secondary | ICD-10-CM

## 2015-12-26 DIAGNOSIS — I739 Peripheral vascular disease, unspecified: Secondary | ICD-10-CM | POA: Diagnosis not present

## 2015-12-26 DIAGNOSIS — M25511 Pain in right shoulder: Secondary | ICD-10-CM | POA: Insufficient documentation

## 2015-12-26 DIAGNOSIS — E119 Type 2 diabetes mellitus without complications: Secondary | ICD-10-CM | POA: Diagnosis not present

## 2015-12-26 MED ORDER — LIDOCAINE HCL (PF) 1 % IJ SOLN
10.0000 mL | Freq: Once | INTRAMUSCULAR | Status: AC
  Administered 2015-12-26: 10 mL

## 2015-12-26 MED ORDER — SODIUM CHLORIDE 0.9% FLUSH
2.0000 mL | Freq: Once | INTRAVENOUS | Status: AC
  Administered 2015-12-26: 2 mL

## 2015-12-26 MED ORDER — LACTATED RINGERS IV SOLN: 1000.0000 mL | Freq: Once | INTRAVENOUS | Status: DC

## 2015-12-26 MED ORDER — LIDOCAINE HCL (PF) 1 % IJ SOLN
INTRAMUSCULAR | Status: AC
  Administered 2015-12-26: 13:00:00
  Filled 2015-12-26: qty 10

## 2015-12-26 MED ORDER — MIDAZOLAM HCL 5 MG/5ML IJ SOLN
INTRAMUSCULAR | Status: AC
  Administered 2015-12-26: 1 mg via INTRAVENOUS
  Filled 2015-12-26: qty 5

## 2015-12-26 MED ORDER — LIDOCAINE HCL (PF) 1 % IJ SOLN
INTRAMUSCULAR | Status: AC
  Administered 2015-12-26: 13:00:00
  Filled 2015-12-26: qty 5

## 2015-12-26 MED ORDER — IOPAMIDOL (ISOVUE-M 200) INJECTION 41%
10.0000 mL | Freq: Once | INTRAMUSCULAR | Status: AC
  Administered 2015-12-26: 10 mL via EPIDURAL

## 2015-12-26 MED ORDER — SODIUM HYALURONATE (VISCOSUP) 20 MG/2ML IX SOSY
2.0000 mL | PREFILLED_SYRINGE | Freq: Once | INTRA_ARTICULAR | Status: DC
  Filled 2015-12-26: qty 2

## 2015-12-26 MED ORDER — SODIUM CHLORIDE 0.9 % IJ SOLN
INTRAMUSCULAR | Status: AC
  Administered 2015-12-26: 13:00:00
  Filled 2015-12-26: qty 10

## 2015-12-26 MED ORDER — ROPIVACAINE HCL 2 MG/ML IJ SOLN
INTRAMUSCULAR | Status: AC
  Administered 2015-12-26: 13:00:00
  Filled 2015-12-26: qty 10

## 2015-12-26 MED ORDER — MIDAZOLAM HCL 5 MG/5ML IJ SOLN: 1.0000 mg | INTRAMUSCULAR | Status: DC | PRN

## 2015-12-26 MED ORDER — ROPIVACAINE HCL 2 MG/ML IJ SOLN
2.0000 mL | Freq: Once | INTRAMUSCULAR | Status: AC
  Administered 2015-12-26: 2 mL via EPIDURAL

## 2015-12-26 MED ORDER — TRIAMCINOLONE ACETONIDE 40 MG/ML IJ SUSP
INTRAMUSCULAR | Status: AC
  Administered 2015-12-26: 13:00:00
  Filled 2015-12-26: qty 1

## 2015-12-26 NOTE — Patient Instructions (Signed)
Knee Injection A knee injection is a procedure to get medicine into your knee joint. Your health care provider puts a needle into the joint and injects medicine with an attached syringe. The injected medicine may relieve the pain, swelling, and stiffness of arthritis. The injected medicine may also help to lubricate and cushion your knee joint. You may need more than one injection. LET [facility] CARE PROVIDER KNOW ABOUT:  Any allergies you have.  All medicines you are taking, including vitamins, herbs, eye drops, creams, and over-the-counter medicines.  Previous problems you or members of your family have had with the use of anesthetics.  Any blood disorders you have.  Previous surgeries you have had.  Any medical conditions you may have. RISKS AND COMPLICATIONS Generally, this is a safe procedure. However, problems may occur, including:  Infection.  Bleeding.  Worsening symptoms.  Damage to the area around your knee.  Allergic reaction to any of the medicines.  Skin reactions from repeated injections. BEFORE THE PROCEDURE  Ask your health care provider about changing or stopping your regular medicines. This is especially important if you are taking diabetes medicines or blood thinners.  Plan to have someone take you home after the procedure. PROCEDURE  You will sit or lie down in a position for your knee to be treated.  The skin over your kneecap will be cleaned with a germ-killing solution (antiseptic).  You will be given a medicine that numbs the area (local anesthetic). You may feel some stinging.  After your knee becomes numb, you will have a second injection. This is the medicine. This needle is carefully placed between your kneecap and your knee. The medicine is injected into the joint space.  At the end of the procedure, the needle will be removed.  A bandage (dressing) may be placed over the injection site. The procedure may vary among health care providers  and hospitals. AFTER THE PROCEDURE  You may have to move your knee through its full range of motion. This helps to get all of the medicine into your joint space.  Your blood pressure, heart rate, breathing rate, and blood oxygen level will be monitored often until the medicines you were given have worn off.  You will be watched to make sure that you do not have a reaction to the injected medicine.   This information is not intended to replace advice given to you by your health care provider. Make sure you discuss any questions you have with your health care provider.   Document Released: 10/18/2006 Document Revised: 08/17/2014 Document Reviewed: 06/06/2014 Elsevier Interactive Patient Education 2016 [employer] Epidural Steroid Injection An epidural steroid injection is given to relieve pain in your neck, back, or legs that is caused by the irritation or swelling of a nerve root. This procedure involves injecting a steroid and numbing medicine (anesthetic) into the epidural space. The epidural space is the space between the outer covering of your spinal cord and the bones that form your backbone (vertebra).  LET [facility] CARE PROVIDER KNOW ABOUT:   Any allergies you have.  All medicines you are taking, including vitamins, herbs, eye drops, creams, and over-the-counter medicines such as aspirin.  Previous problems you or members of your family have had with the use of anesthetics.  Any blood disorders or blood clotting disorders you have.  Previous surgeries you have had.  Medical conditions you have. RISKS AND COMPLICATIONS Generally, this is a safe procedure. However, as with any procedure, complications can  occur. Possible complications of epidural steroid injection include:  Headache.  Bleeding.  Infection.  Allergic reaction to the medicines.  Damage to your nerves. The response to this procedure depends on the underlying cause of the pain and its duration. People  who have long-term (chronic) pain are less likely to benefit from epidural steroids than are those people whose pain comes on strong and suddenly. BEFORE THE PROCEDURE   Ask your health care provider about changing or stopping your regular medicines. You may be advised to stop taking blood-thinning medicines a few days before the procedure.  You may be given medicines to reduce anxiety.  Arrange for someone to take you home after the procedure. PROCEDURE   You will remain awake during the procedure. You may receive medicine to make you relaxed.  You will be asked to lie on your stomach.  The injection site will be cleaned.  The injection site will be numbed with a medicine (local anesthetic).  A needle will be injected through your skin into the epidural space.  Your health care provider will use an X-ray machine to ensure that the steroid is delivered closest to the affected nerve. You may have minimal discomfort at this time.  Once the needle is in the right position, the local anesthetic and the steroid will be injected into the epidural space.  The needle will then be removed and a bandage will be applied to the injection site. AFTER THE PROCEDURE   You may be monitored for a short time before you go home.  You may feel weakness or numbness in your arm or leg, which disappears within hours.  You may be allowed to eat, drink, and take your regular medicine.  You may have soreness at the site of the injection.   This information is not intended to replace advice given to you by your health care provider. Make sure you discuss any questions you have with your health care provider.   Document Released: 11/03/2007 Document Revised: 03/29/2013 Document Reviewed: 01/13/2013 Elsevier Interactive Patient Education 2016 [employer]. Pain Management Discharge Instructions  General Discharge Instructions :  If you need to reach your doctor call: Monday-Friday 8:00 am - 4:00 pm at  [phone] or toll free [phone].  After clinic hours [phone] to have operator reach doctor.  Bring all of your medication bottles to all your appointments in the pain clinic.  To cancel or reschedule your appointment with Pain Management please remember to call 24 hours in advance to avoid a fee.  Refer to the educational materials which you have been given on: General Risks, I had my Procedure. Discharge Instructions, Post Sedation.  Post Procedure Instructions:  The drugs you were given will stay in your system until tomorrow, so for the next 24 hours you should not drive, make any legal decisions or drink any alcoholic beverages.  You may eat anything you prefer, but it is better to start with liquids then soups and crackers, and gradually work up to solid foods.  Please notify your doctor immediately if you have any unusual bleeding, trouble breathing or pain that is not related to your normal pain.  Depending on the type of procedure that was done, some parts of your body may feel week and/or numb.  This usually clears up by tonight or the next day.  Walk with the use of an assistive device or accompanied by an adult for the 24 hours.  You may use ice on the affected area for the  first 24 hours.  Put ice in a Ziploc bag and cover with a towel and place against area 15 minutes on 15 minutes off.  You may switch to heat after 24 hours.

## 2015-12-26 NOTE — Progress Notes (Signed)
Safety precautions to be maintained throughout the outpatient stay will include: orient to surroundings, keep bed in low position, maintain call bell within reach at all times, provide assistance with transfer out of bed and ambulation.  

## 2015-12-26 NOTE — Progress Notes (Signed)
Patient's Name: Kristen Barron  Patient type: Established  MRN: [id number]  Service setting: Ambulatory outpatient  DOB: 1928-11-22  Location: [facility]  DOS: 12/26/2015  Primary Care Physician: Baltazar Apo, MD  Note by: Kathlen Brunswick. Dossie Arbour, M.D, DABA, Sarita Haver, DABPM, Milagros Evener, FIPP  Referring Physician: Denton Lank, MD  Specialty: Board-Certified Interventional Pain Management  Last Visit to Pain Management: 12/24/2015   Primary Reason(s) for Visit: Interventional Pain Management Treatment. CC: Back Pain and Knee Pain  Primary Diagnosis: Chronic pain of lower extremity, unspecified laterality [M79.606, G89.29]   Procedure #1:  Anesthesia, Analgesia, Anxiolysis:  Type: Therapeutic Inter-Laminar Epidural Steroid Injection Region: Lumbar Level: L5-S1 Level. Laterality: Right Paramedial  Indications: 1. Chronic pain of lower extremity, unspecified laterality   2. Failed back surgical syndrome x 2 (Surgeon: Dr. Deri Fuelling)   3. Lumbar stenosis with neurogenic claudication    Procedure #2:  Type: Therapeutic Intra-Articular Hyalgan Knee Injection #1 Region: Lateral  Knee Region Level: Knee Joint Laterality: Bilateral  Indications:  1. Chronic knee pain (Location of Primary Source of Pain) (Bilateral) (L>R)   2. Primary osteoarthritis of both knees      Pre-procedure Pain Score: 8/10 Reported level of pain is compatible with clinical observations Post-procedure Pain Score: 4   Type: Moderate (Conscious) Sedation & Local Anesthesia Local Anesthetic: Lidocaine 1% Route: Intravenous (IV) IV Access: Secured Sedation: Meaningful verbal contact was maintained at all times during the procedure  Indication(s): Analgesia & Anxiolysis   Pre-Procedure Assessment:  Kristen Barron is a 82 y.o. year old, female patient, seen today for interventional treatment. She has Lumbar central spinal stenosis ( Severe at L3-4) (Mild R>L L2-3 & L5-S1); Hyponatremia; Hypertension;  Normocytic anemia; CKD (chronic kidney disease) stage 3, GFR 30-59 ml/min;  Lumbar DDD (degenerative disc disease); Lumbar stenosis with neurogenic claudication; Breast cancer, right, invasive ductal; Chronic pain; Long term current use of opiate analgesic; Encounter for therapeutic drug level monitoring; Chronic knee pain (Location of Primary Source of Pain) (Bilateral) (L>R); Long term prescription opiate use; Opiate use (20 MME/Day); Chronic hand pain (Location of Secondary source of pain) (Bilateral) (L>R); Failed back surgical syndrome x 2 (Surgeon: Dr. Deri Fuelling); Lumbar spondylosis; Chronic low back pain (Location of Tertiary source of pain) (Bilateral) (L>R); Lumbar facet syndrome; Chronic lower extremity pain (Bilateral) (L>R); Chronic neck pain (Left); Cervical spondylosis; Chronic shoulder pain (Right); Insulin dependent diabetes mellitus ([place]); Abnormal MRI, lumbar spine (07/19/2013); Lumbar postlaminectomy syndrome (L4-5); Fusion of lumbar spine (L4-5 Ray cages); Lumbar Levoscoliosis with apex at L4; Grade 1 Retrolisthesis of L2 over L3 and L3 over L4; Lumbar facet arthropathy; Lumbar foraminal stenosis (Bilateral L2-3, L3-4 & Left L5-S1); Chronic anticoagulation (Plavix); and Osteoarthritis of knee (Bilateral) (L>R) on her problem list.. Her primarily concern today is the Back Pain and Knee Pain   Pain Type: Chronic pain Pain Location: Back Pain Orientation: Lower Pain Descriptors / Indicators: Aching, Throbbing, Burning Pain Frequency: Constant  Date of Last Visit: 12/12/15 Service Provided on Last Visit: Evaluation  Verification of the correct person, correct site (including marking of site), and correct procedure were performed and confirmed by the patient.  Consent: Secured. Under the influence of no sedatives a written informed consent was obtained, after having provided information on the risks and possible complications. To fulfill our ethical and legal obligations, as  recommended by the American Medical Association's Code of Ethics, we have provided information to the patient about our clinical impression; the nature and purpose of the treatment or  procedure; the risks, benefits, and possible complications of the intervention; alternatives; the risk(s) and benefit(s) of the alternative treatment(s) or procedure(s); and the risk(s) and benefit(s) of doing nothing. The patient was provided information about the risks and possible complications associated with the procedure. These include, but are not limited to, failure to achieve desired goals, infection, bleeding, organ or nerve damage, allergic reactions, paralysis, and death. In the case of spinal procedures these may include, but are not limited to, failure to achieve desired goals, infection, bleeding, organ or nerve damage, allergic reactions, paralysis, and death. In addition, the patient was informed that Medicine is not an exact science; therefore, there is also the possibility of unforeseen risks and possible complications that may result in a catastrophic outcome. The patient indicated having understood very clearly. We have given the patient no guarantees and we have made no promises. Enough time was given to the patient to ask questions, all of which were answered to the patient's satisfaction.  Consent Attestation: I, the ordering provider, attest that I have discussed with the patient the benefits, risks, side-effects, alternatives, likelihood of achieving goals, and potential problems during recovery for the procedure that I have provided informed consent.  Pre-Procedure Preparation: Safety Precautions: Allergies reviewed. Appropriate site, procedure, and patient were confirmed by following the Joint Commission's Universal Protocol (UP.01.01.01), in the form of a "Time Out". The patient was asked to confirm marked site and procedure, before commencing. The patient was asked about blood thinners, or active  infections, both of which were denied. Patient was assessed for positional comfort and all pressure points were checked before starting procedure. Allergies: She has No Known Allergies.. Infection Control Precautions: Sterile technique used. Standard Universal Precautions were taken as recommended by the Department of [facility] for Disease Control and Prevention (CDC). Standard pre-surgical skin prep was conducted. Respiratory hygiene and cough etiquette was practiced. Hand hygiene observed. Safe injection practices and needle disposal techniques followed. SDV (single dose vial) medications used. Medications properly checked for expiration dates and contaminants. Personal protective equipment (PPE) used: Surgical mask. Sterile Radiation-resistant gloves. Monitoring:  As per clinic protocol. Filed Vitals:   12/26/15 1225 12/26/15 1230 12/26/15 1240 12/26/15 1250  BP: 178/76 151/85 174/62 175/80  Pulse: 66 66 67 66  Temp:      Resp: 15 16 16 16   Height:      Weight:      SpO2: 99% 94% 98% 98%  Calculated BMI: Body mass index is 37.81 kg/(m^2).  Description of Procedure #1 Process:  Time-out: "Time-out" completed before starting procedure, as per protocol. Position: Prone Target Area: For Epidural Steroid injections, the target area is the  interlaminar space, initially targeting the lower border of the superior vertebral body lamina. Approach: Posterior approach. Area Prepped: Entire Posterior Lumbosacral Region Prepping solution: ChloraPrep (2% chlorhexidine gluconate and 70% isopropyl alcohol) Safety Precautions: Aspiration looking for blood return was conducted prior to all injections. At no point did we inject any substances, as a needle was being advanced. No attempts were made at seeking any paresthesias. Safe injection practices and needle disposal techniques used. Medications properly checked for expiration dates. SDV (single dose vial) medications used.         Description  of the Procedure: Protocol guidelines were followed. The patient was placed in position over the fluoroscopy table. The target area was identified and the area prepped in the usual manner. Skin desensitized using vapocoolant spray. Skin & deeper tissues infiltrated with local anesthetic. Appropriate amount of  time allowed to pass for local anesthetics to take effect. The procedure needle was introduced through the skin, ipsilateral to the reported pain, and advanced to the target area. Bone was contacted and the needle walked caudad, until the lamina was cleared. The epidural space was identified using "loss-of-resistance technique" with 2-3 ml of PF-NaCl (0.9% NSS), in a 5cc LOR glass syringe. Proper needle placement secured. Negative aspiration confirmed. Solution injected in intermittent fashion, asking for systemic symptoms every 0.5cc of injectate. The needles were then removed and the area cleansed, making sure to leave some of the prepping solution back to take advantage of its long term bactericidal properties. EBL: None Materials & Medications Used:  Needle(s) Used: 20g - 10cm, Tuohy-style epidural needle Medication(s): Please see chart orders for medication and dosing details.  Imaging Guidance for procedure #1:  Type of Imaging Technique: Fluoroscopy Guidance (Spinal) Indication(s): Assistance in needle guidance and placement for procedures requiring needle placement in or near specific anatomical locations not easily accessible without such assistance. Exposure Time: Please see nurses notes. Contrast: Before injecting any contrast, we confirmed that the patient did not have an allergy to iodine, shellfish, or radiological contrast. Once satisfactory needle placement was completed at the desired level, radiological contrast was injected. Injection was conducted under continuous fluoroscopic guidance. Injection of contrast accomplished without complications. See chart for type and volume of  contrast used. Fluoroscopic Guidance: I was personally present in the fluoroscopy suite, where the patient was placed in position for the procedure, over the fluoroscopy-compatible table. Fluoroscopy was manipulated, using "Tunnel Vision Technique", to obtain the best possible view of the target area, on the affected side. Parallax error was corrected before commencing the procedure. A "direction-depth-direction" technique was used to introduce the needle under continuous pulsed fluoroscopic guidance. Once the target was reached, antero-posterior, oblique, and lateral fluoroscopic projection views were taken to confirm needle placement in all planes. Permanently recorded images stored by scanning into EMR. Interpretation: Intraoperative imaging interpretation by performing Physician. Adequate needle placement confirmed in AP & Oblique Views. Appropriate spread of contrast to desired area. No evidence of afferent or efferent intravascular uptake. No intrathecal or subarachnoid spread observed. Permanent images scanned into the patient's record.  Description of Procedure #2 Process:   Time-out: "Time-out" completed before starting procedure, as per protocol. Position: Supine with a pillow behind the knees. Target Area: Knee Joint Approach: Lateral approach. Area Prepped: Entire knee area, from the mid-thigh to the mid-shin. Prepping solution: ChloraPrep (2% chlorhexidine gluconate and 70% isopropyl alcohol) Safety Precautions: Aspiration looking for blood return was conducted prior to all injections. At no point did we inject any substances, as a needle was being advanced. No attempts were made at seeking any paresthesias. Safe injection practices and needle disposal techniques used. Medications properly checked for expiration dates. SDV (single dose vial) medications used.    Description of the Procedure: Protocol guidelines were followed. The patient was placed in position over the fluoroscopy table. The  target area was identified and the area prepped in the usual manner. Skin desensitized using vapocoolant spray. Skin & deeper tissues infiltrated with local anesthetic. Appropriate amount of time allowed to pass for local anesthetics to take effect. The procedure needles were then advanced to the target area. Proper needle placement secured. Negative aspiration confirmed. Solution injected in intermittent fashion, asking for systemic symptoms every 0.5cc of injectate. The needles were then removed and the area cleansed, making sure to leave some of the prepping solution back to take advantage of  its long term bactericidal properties. EBL: None Materials & Medications Used:  Needle(s) Used: 25g - 1.5" Needle(s) Medications Administered today: Hyalgan 20 mg/2 mL. 2 mL/knee  Imaging Guidance for procedure #2:   Type of Imaging Technique: Fluoroscopy Guidance (Non-spinal) Indication(s): Assistance in needle guidance and placement for procedures requiring needle placement in or near specific anatomical locations not easily accessible without such assistance.. The patient is obese with difficult palpable landmarks. Exposure Time: Please see nurses notes. Contrast: None used. Fluoroscopic Guidance: I was personally present in the fluoroscopy suite, where the patient was placed in position for the procedure, over the fluoroscopy-compatible table. Fluoroscopy was manipulated, using "Tunnel Vision Technique", to obtain the best possible view of the target area, on the affected side. Parallax error was corrected before commencing the procedure. A "direction-depth-direction" technique was used to introduce the needle under continuous pulsed fluoroscopic guidance. Once the target was reached, antero-posterior, oblique, and lateral fluoroscopic projection views were taken to confirm needle placement in all planes. Permanently recorded images stored by scanning into EMR. Interpretation: Intraoperative imaging  interpretation by performing Physician. Adequate needle placement confirmed. No contrast injected. Permanent hardcopy images in multiple planes scanned into the patient's record.. Clear evidence of bone on bone pathology in the lateral compartment of both knees.  Antibiotic Prophylaxis:  Indication(s): No indications identified. Type:  Antibiotics Given (last 72 hours)    None       Post-operative Assessment:   Complications: No immediate post-treatment complications were observed. Relevant Post-operative Information:  Disposition: Return to clinic for follow-up evaluation. The patient tolerated the entire procedure well. A repeat set of vitals were taken after the procedure and the patient was kept under observation following institutional policy, for this procedure. Post-procedural neurological assessment was performed, showing return to baseline, prior to discharge. The patient was discharged home, once institutional criteria were met. The patient was provided with post-procedure discharge instructions, including a section on how to identify potential problems. Should any problems arise concerning this procedure, the patient was given instructions to immediately contact us, at any time, without hesitation. In any case, we plan to contact the patient by telephone for a follow-up status report regarding this interventional procedure. Comments:  No additional relevant information.  Medications administered during this visit: We administered iopamidol, lidocaine (PF), sodium chloride flush, ropivacaine (PF) 2 mg/ml (0.2%), lidocaine (PF), lidocaine (PF), lidocaine (PF), sodium chloride, ropivacaine (PF) 2 mg/ml (0.2%), triamcinolone acetonide, and midazolam.  Prescriptions ordered during this visit: New Prescriptions   No medications on file    Future Appointments Date Time Provider [place]  02/19/2016 1:00 PM [facility]  03/11/2016 11:15 AM [facility] None   03/11/2016 11:30 AM Forest Gleason, MD [facility] None    Primary Care Physician: Baltazar Apo, MD Location: [facility] Note by: Kathlen Brunswick. Dossie Arbour, M.D, DABA, DABAPM, DABPM, DABIPP, FIPP  Disclaimer:  Medicine is not an exact science. The only guarantee in medicine is that nothing is guaranteed. It is important to note that the decision to proceed with this intervention was based on the information collected from the patient. The Data and conclusions were drawn from the patient's questionnaire, the interview, and the physical examination. Because the information was provided in large part by the patient, it cannot be guaranteed that it has not been purposely or unconsciously manipulated. Every effort has been made to obtain as much relevant data as possible for this evaluation. It is important to note that the conclusions that  lead to this procedure are derived in large part from the available data. Always take into account that the treatment will also be dependent on availability of resources and existing treatment guidelines, considered by other Pain Management Practitioners as being common knowledge and practice, at the time of the intervention. For Medico-Legal purposes, it is also important to point out that variation in procedural techniques and pharmacological choices are the acceptable norm. The indications, contraindications, technique, and results of the above procedure should only be interpreted and judged by a Board-Certified Interventional Pain Specialist with extensive familiarity and expertise in the same exact procedure and technique. Attempts at providing opinions without similar or greater experience and expertise than that of the treating physician will be considered as inappropriate and unethical, and shall result in a formal complaint to the state medical board and applicable specialty societies.

## 2015-12-27 ENCOUNTER — Telehealth: Payer: Self-pay | Admitting: *Deleted

## 2015-12-27 NOTE — Telephone Encounter (Signed)
No problems 

## 2016-02-19 ENCOUNTER — Other Ambulatory Visit: Payer: Self-pay | Admitting: Family Medicine

## 2016-02-19 ENCOUNTER — Ambulatory Visit
Admission: RE | Admit: 2016-02-19 | Discharge: 2016-02-19 | Disposition: A | Discharge status: Home / Self Care | Payer: Medicare Other | Source: Ambulatory Visit | Attending: Family Medicine | Admitting: Family Medicine

## 2016-02-19 DIAGNOSIS — C50911 Malignant neoplasm of unspecified site of right female breast: Secondary | ICD-10-CM

## 2016-02-19 DIAGNOSIS — Z1231 Encounter for screening mammogram for malignant neoplasm of breast: Secondary | ICD-10-CM | POA: Diagnosis present

## 2016-03-11 ENCOUNTER — Ambulatory Visit: Payer: Medicare Other | Admitting: Oncology

## 2016-03-11 ENCOUNTER — Other Ambulatory Visit: Payer: Medicare Other

## 2016-03-17 ENCOUNTER — Other Ambulatory Visit: Payer: Self-pay | Admitting: *Deleted

## 2016-03-17 DIAGNOSIS — C50911 Malignant neoplasm of unspecified site of right female breast: Secondary | ICD-10-CM

## 2016-03-22 NOTE — Progress Notes (Signed)
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  INTERVAL HISTORY:   Patient returns to clinic today for routine 6 month evaluation. She continues to have significant joint pain, particularly in her knees, which is chronic and unchanged. She otherwise feels well and is asymptomatic. She has no neurologic complaints. She denies any other pain. She has a good appetite and denies weight loss. She has no chest pain or shortness of breath. She denies any nausea, vomiting, constipation, or diarrhea. She has no urinary complaints. Patient offers no further specific complaints today.   REVIEW OF SYSTEMS:   Review of Systems  Constitutional: Negative.  Negative for fever, malaise/fatigue and weight loss.  Respiratory: Negative.  Negative for cough and shortness of breath.   Cardiovascular: Negative.  Negative for chest pain.  Gastrointestinal: Negative.  Negative for abdominal pain.  Genitourinary: Negative.   Musculoskeletal: Positive for back pain and joint pain.  Neurological: Negative.  Negative for weakness.  Psychiatric/Behavioral: Negative.     As per HPI. Otherwise, a complete review of systems is negatve.  ONCOLOGY HISTORY: Oncology History   1. Carcinoma of the right breast T1c N0 M0 tumor based on ultrasound and the breast mammogram. ER negative, PR negative, HER-2/neu positive (diagnosis in July of 2015). 2. Iron deficiency anemia with chronic renal disease. 3. Status post mastectomy (right) in August of 2015. Patient has stage IC, ER negative, PR negative, HER-2 receptor positive.  4. Patient  started on chemotherapy with Taxol and Herceptin on April 09, 2014. 5. Because of progressive side effect Taxol was discontinued (August 28, 2014) Herceptin would be continued to July of 2016 6.  Because of swelling of lower extremity and shortness of breath Herceptin was discontinued in March of 2016.     Breast cancer of lower-outer quadrant of right female breast ([place])   02/28/2014 Initial Diagnosis    Breast cancer, right, invasive ductal      PAST MEDICAL HISTORY: Past Medical History:  Diagnosis Date  . Arthritis   . Breast cancer ([place]) 03/06/2014   right breast with mastectomy and chemo  . Cancer [facility])    Reports lumpectomy for a cyst  . CHF (congestive heart failure) ([place])   . Diabetes ([place])   . Hemorrhoids   . Hypertension   . Renal disorder   . Stroke [facility])    August 2014, but has had strokes prior as well    PAST SURGICAL HISTORY: Past Surgical History:  Procedure Laterality Date  . ABDOMINAL HYSTERECTOMY     Patient not clear as to why  . BACK SURGERY    . BREAST BIOPSY Right February 15 2014   invasive mammary carcinoma/ER/PR negative, HER 2 positive  . BREAST BIOPSY Right 1999   negative biopsy  . BREAST SURGERY Right 03/06/14   mastectomy  . HAND SURGERY Right    Carpel tunnel release in the 1970s  . LUMBAR LAMINECTOMY/DECOMPRESSION MICRODISCECTOMY Bilateral 07/24/2013   Procedure: LUMBAR LAMINECTOMY/DECOMPRESSION MICRODISCECTOMY LUMBAR THREE-FOUR;  Surgeon: Charlie Pitter, MD;  Location: [place] NEURO ORS;  Service: Neurosurgery;  Laterality: Bilateral;  . MASTECTOMY Right 03/06/2014   right br ca  FAMILY HISTORY Family History  Problem Relation Age of Onset  . Cancer Sister 40    breast  . Breast cancer Sister 75  . Cancer Brother     kidney  . Cancer Maternal Aunt     breast  . Breast cancer Maternal Aunt 70  . Cancer Other     maternal niece with breast cancer  . Breast cancer Other   . Cancer Sister     pancreatic    GYNECOLOGIC HISTORY:  No  LMP recorded. Patient has had a hysterectomy.     ADVANCED DIRECTIVES:    HEALTH MAINTENANCE: Social History  Substance Use Topics  . Smoking status: Never Smoker  . Smokeless tobacco: Never Used  . Alcohol use No    No Known Allergies  Current Outpatient Prescriptions  Medication Sig Dispense Refill  . acetaminophen (TYLENOL) 500 MG tablet Take 500 mg by mouth every 6 (six) hours as needed.    . clopidogrel (PLAVIX) 75 MG tablet Take 75 mg by mouth daily with breakfast.    . gabapentin (NEURONTIN) 300 MG capsule Take 600 mg by mouth 3 (three) times daily.     . Glucosamine-Chondroit-Vit C-Mn (GLUCOSAMINE 1500 COMPLEX PO) Take by mouth 3 (three) times daily.    . hydrALAZINE (APRESOLINE) 25 MG tablet Take 25 mg by mouth 3 (three) times daily.    . hydrALAZINE (APRESOLINE) 50 MG tablet     . HYDROcodone-acetaminophen (NORCO/VICODIN) 5-325 MG per tablet Take 1 tablet by mouth every 6 (six) hours as needed.     . magnesium oxide (MAG-OX) 400 MG tablet Take 400 mg by mouth 2 (two) times daily.    . Melatonin 5 MG CAPS Take 5 mg by mouth at bedtime.    . metoprolol succinate (TOPROL-XL) 100 MG 24 hr tablet Take 100 mg by mouth daily. Take with or immediately following a meal.    . Omega 3 1200 MG CAPS Take by mouth 2 (two) times daily.    . pantoprazole (PROTONIX) 40 MG tablet Take 1 tablet (40 mg total) by mouth daily. 30 tablet 3  . pravastatin (PRAVACHOL) 80 MG tablet Take 80 mg by mouth every evening.     . torsemide (DEMADEX) 20 MG tablet Take 20 mg by mouth 2 (two) times daily.     Current Facility-Administered Medications  Medication Dose Route Frequency Provider Last Rate Last Dose  . lactated ringers infusion 1,000 mL  1,000 mL Intravenous Once Milinda Pointer, MD      . midazolam (VERSED) 5 MG/5ML injection 1-2 mg  1-2 mg Intravenous Q5 min PRN Milinda Pointer, MD      . Sodium Hyaluronate SOSY 2 mL  2 mL Intra-articular Once Milinda Pointer, MD         OBJECTIVE: BP (!) 190/81 (BP Location: Left Arm, Patient Position: Sitting)   Pulse 82   Temp 98.1 F (36.7 C) (Tympanic)   Resp 18   Wt 208 lb 5.4 oz (94.5 kg)   BMI 39.36 kg/m    Body mass index is 39.36 kg/m.    ECOG FS:2 - Symptomatic, <50% confined to bed  General: Well-developed, well-nourished, no acute distress. Using walker for ambulation aid. Eyes: Pink conjunctiva, anicteric sclera. Lungs: Clear to auscultation bilaterally. Heart: Regular rate and rhythm. 3/6 systolic murmur present. Abdomen: Soft, nontender, nondistended. No organomegaly noted, normoactive bowel sounds. Breast: Right chest wall and axilla without lumps or masses.  Musculoskeletal: 2+ lower extremity bilateral edema, no cyanosis or clubbing. Neuro: Alert,  answering all questions appropriately. Cranial nerves grossly intact. Skin: No rashes or petechiae noted. Psych: Normal affect.   LAB RESULTS:  Appointment on 03/23/2016  Component Date Value Ref Range Status  . WBC 03/23/2016 5.5  3.6 - 11.0 K/uL Final  . RBC 03/23/2016 3.62* 3.80 - 5.20 MIL/uL Final  . Hemoglobin 03/23/2016 10.6* 12.0 - 16.0 g/dL Final  . HCT 03/23/2016 31.2* 35.0 - 47.0 % Final  . MCV 03/23/2016 86.2  80.0 - 100.0 fL Final  . MCH 03/23/2016 29.3  26.0 - 34.0 pg Final  . MCHC 03/23/2016 34.0  32.0 - 36.0 g/dL Final  . RDW 03/23/2016 15.0* 11.5 - 14.5 % Final  . Platelets 03/23/2016 250  150 - 440 K/uL Final  . Neutrophils Relative % 03/23/2016 68  % Final  . Neutro Abs 03/23/2016 3.8  1.4 - 6.5 K/uL Final  . Lymphocytes Relative 03/23/2016 17  % Final  . Lymphs Abs 03/23/2016 1.0  1.0 - 3.6 K/uL Final  . Monocytes Relative 03/23/2016 11  % Final  . Monocytes Absolute 03/23/2016 0.6  0.2 - 0.9 K/uL Final  . Eosinophils Relative 03/23/2016 3  % Final  . Eosinophils Absolute 03/23/2016 0.2  0 - 0.7 K/uL Final  . Basophils Relative 03/23/2016 1  % Final  . Basophils Absolute 03/23/2016 0.1  0 - 0.1 K/uL Final  . Sodium  03/23/2016 133* 135 - 145 mmol/L Final  . Potassium 03/23/2016 3.7  3.5 - 5.1 mmol/L Final  . Chloride 03/23/2016 103  101 - 111 mmol/L Final  . CO2 03/23/2016 24  22 - 32 mmol/L Final  . Glucose, Bld 03/23/2016 93  65 - 99 mg/dL Final  . BUN 03/23/2016 14  6 - 20 mg/dL Final  . Creatinine, Ser 03/23/2016 1.02* 0.44 - 1.00 mg/dL Final  . Calcium 03/23/2016 7.2* 8.9 - 10.3 mg/dL Final  . Total Protein 03/23/2016 6.3* 6.5 - 8.1 g/dL Final  . Albumin 03/23/2016 3.1* 3.5 - 5.0 g/dL Final  . AST 03/23/2016 18  15 - 41 U/L Final  . ALT 03/23/2016 11* 14 - 54 U/L Final  . Alkaline Phosphatase 03/23/2016 59  38 - 126 U/L Final  . Total Bilirubin 03/23/2016 0.4  0.3 - 1.2 mg/dL Final  . GFR calc non Af Amer 03/23/2016 49* >60 mL/min Final  . GFR calc Af Amer 03/23/2016 57* >60 mL/min Final   Comment: (NOTE) The eGFR has been calculated using the CKD EPI equation. This calculation has not been validated in all clinical situations. eGFR's persistently <60 mL/min signify possible Chronic Kidney Disease.   . Anion gap 03/23/2016 6  5 - 15 Final    STUDIES: No results found.  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  PLAN:    1. 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 Patient is status post right mastectomy on March 06, 2014.  Patient also received Taxol and Herceptin between August 2015 and to January 2016 when Taxol was discontinued secondary to side effects. Herceptin was also subsequently discontinued in March 2016. Patient's most recent mammogram on February 19, 2016 was reported as BI-RADS 1, repeat in one year. She does not require aromatase inhibitor given the ER/PR status of her tumor.  Return to clinic in 6 months with repeat laboratory work and further evaluation. 2. Joint pain: Chronic and  unchanged.  Continue treatment per primary care. 3. Anemia: Mild, monitor. Repeat CBC and next clinic visit.  Patient expressed understanding and was in agreement with this plan. She also understands that She can call clinic at any time with any questions, concerns, or complaints.    Breast cancer, right, invasive ductal   Staging form: Breast, AJCC 7th Edition     Clinical: Stage IA (T1c, N0, M0) - Unsigned   Lloyd Huger, MD   03/23/2016 12:44 PM

## 2016-03-23 ENCOUNTER — Inpatient Hospital Stay: Payer: Medicare Other

## 2016-03-23 ENCOUNTER — Inpatient Hospital Stay: Payer: Medicare Other | Attending: Oncology | Admitting: Oncology

## 2016-03-23 VITALS — BP 190/81 | HR 82 | Temp 98.1°F | Resp 18 | Wt 208.3 lb

## 2016-03-23 DIAGNOSIS — Z9221 Personal history of antineoplastic chemotherapy: Secondary | ICD-10-CM

## 2016-03-23 DIAGNOSIS — N189 Chronic kidney disease, unspecified: Secondary | ICD-10-CM | POA: Insufficient documentation

## 2016-03-23 DIAGNOSIS — Z171 Estrogen receptor negative status [ER-]: Secondary | ICD-10-CM | POA: Diagnosis not present

## 2016-03-23 DIAGNOSIS — Z79899 Other long term (current) drug therapy: Secondary | ICD-10-CM | POA: Insufficient documentation

## 2016-03-23 DIAGNOSIS — Z853 Personal history of malignant neoplasm of breast: Secondary | ICD-10-CM | POA: Diagnosis not present

## 2016-03-23 DIAGNOSIS — M25561 Pain in right knee: Secondary | ICD-10-CM | POA: Diagnosis not present

## 2016-03-23 DIAGNOSIS — Z9011 Acquired absence of right breast and nipple: Secondary | ICD-10-CM | POA: Insufficient documentation

## 2016-03-23 DIAGNOSIS — M549 Dorsalgia, unspecified: Secondary | ICD-10-CM

## 2016-03-23 DIAGNOSIS — I129 Hypertensive chronic kidney disease with stage 1 through stage 4 chronic kidney disease, or unspecified chronic kidney disease: Secondary | ICD-10-CM | POA: Diagnosis not present

## 2016-03-23 DIAGNOSIS — M25562 Pain in left knee: Secondary | ICD-10-CM | POA: Diagnosis not present

## 2016-03-23 DIAGNOSIS — M1990 Unspecified osteoarthritis, unspecified site: Secondary | ICD-10-CM

## 2016-03-23 DIAGNOSIS — C50911 Malignant neoplasm of unspecified site of right female breast: Secondary | ICD-10-CM

## 2016-03-23 DIAGNOSIS — G8929 Other chronic pain: Secondary | ICD-10-CM | POA: Insufficient documentation

## 2016-03-23 DIAGNOSIS — D509 Iron deficiency anemia, unspecified: Secondary | ICD-10-CM | POA: Insufficient documentation

## 2016-03-23 DIAGNOSIS — Z803 Family history of malignant neoplasm of breast: Secondary | ICD-10-CM | POA: Diagnosis not present

## 2016-03-23 DIAGNOSIS — Z800 Family history of malignant neoplasm of digestive organs: Secondary | ICD-10-CM | POA: Insufficient documentation

## 2016-03-23 DIAGNOSIS — C50511 Malignant neoplasm of lower-outer quadrant of right female breast: Secondary | ICD-10-CM

## 2016-03-23 DIAGNOSIS — E1122 Type 2 diabetes mellitus with diabetic chronic kidney disease: Secondary | ICD-10-CM

## 2016-03-23 DIAGNOSIS — Z95828 Presence of other vascular implants and grafts: Secondary | ICD-10-CM

## 2016-03-23 DIAGNOSIS — Z8051 Family history of malignant neoplasm of kidney: Secondary | ICD-10-CM | POA: Insufficient documentation

## 2016-03-23 DIAGNOSIS — D631 Anemia in chronic kidney disease: Secondary | ICD-10-CM

## 2016-03-23 DIAGNOSIS — Z8673 Personal history of transient ischemic attack (TIA), and cerebral infarction without residual deficits: Secondary | ICD-10-CM | POA: Insufficient documentation

## 2016-03-23 LAB — CBC WITH DIFFERENTIAL/PLATELET
BASOS PCT: 1 %
Basophils Absolute: 0.1 10*3/uL (ref 0–0.1)
Eosinophils Absolute: 0.2 10*3/uL (ref 0–0.7)
Eosinophils Relative: 3 %
HEMATOCRIT: 31.2 % — AB (ref 35.0–47.0)
Hemoglobin: 10.6 g/dL — ABNORMAL LOW (ref 12.0–16.0)
Lymphocytes Relative: 17 %
Lymphs Abs: 1 10*3/uL (ref 1.0–3.6)
MCH: 29.3 pg (ref 26.0–34.0)
MCHC: 34 g/dL (ref 32.0–36.0)
MCV: 86.2 fL (ref 80.0–100.0)
MONO ABS: 0.6 10*3/uL (ref 0.2–0.9)
MONOS PCT: 11 %
NEUTROS ABS: 3.8 10*3/uL (ref 1.4–6.5)
Neutrophils Relative %: 68 %
Platelets: 250 10*3/uL (ref 150–440)
RBC: 3.62 MIL/uL — ABNORMAL LOW (ref 3.80–5.20)
RDW: 15 % — AB (ref 11.5–14.5)
WBC: 5.5 10*3/uL (ref 3.6–11.0)

## 2016-03-23 LAB — COMPREHENSIVE METABOLIC PANEL
ALBUMIN: 3.1 g/dL — AB (ref 3.5–5.0)
ALT: 11 U/L — ABNORMAL LOW (ref 14–54)
ANION GAP: 6 (ref 5–15)
AST: 18 U/L (ref 15–41)
Alkaline Phosphatase: 59 U/L (ref 38–126)
BILIRUBIN TOTAL: 0.4 mg/dL (ref 0.3–1.2)
BUN: 14 mg/dL (ref 6–20)
CALCIUM: 7.2 mg/dL — AB (ref 8.9–10.3)
CO2: 24 mmol/L (ref 22–32)
CREATININE: 1.02 mg/dL — AB (ref 0.44–1.00)
Chloride: 103 mmol/L (ref 101–111)
GFR calc Af Amer: 57 mL/min — ABNORMAL LOW (ref >60)
GFR calc non Af Amer: 49 mL/min — ABNORMAL LOW (ref >60)
GLUCOSE: 93 mg/dL (ref 65–99)
Potassium: 3.7 mmol/L (ref 3.5–5.1)
Sodium: 133 mmol/L — ABNORMAL LOW (ref 135–145)
TOTAL PROTEIN: 6.3 g/dL — AB (ref 6.5–8.1)

## 2016-03-23 MED ORDER — SODIUM CHLORIDE 0.9% FLUSH
10.0000 mL | INTRAVENOUS | Status: DC | PRN
  Administered 2016-03-23: 10 mL via INTRAVENOUS
  Filled 2016-03-23: qty 10

## 2016-03-23 MED ORDER — HEPARIN SOD (PORK) LOCK FLUSH 100 UNIT/ML IV SOLN
500.0000 [IU] | Freq: Once | INTRAVENOUS | Status: AC
  Administered 2016-03-23: 500 [IU] via INTRAVENOUS

## 2016-03-23 NOTE — Progress Notes (Signed)
States has constant pain in both knees that is managed by PCP. No other complaints.

## 2016-04-08 ENCOUNTER — Encounter: Payer: Self-pay | Admitting: Emergency Medicine

## 2016-04-08 DIAGNOSIS — I509 Heart failure, unspecified: Secondary | ICD-10-CM | POA: Diagnosis present

## 2016-04-08 DIAGNOSIS — Z79899 Other long term (current) drug therapy: Secondary | ICD-10-CM

## 2016-04-08 DIAGNOSIS — A084 Viral intestinal infection, unspecified: Principal | ICD-10-CM | POA: Diagnosis present

## 2016-04-08 DIAGNOSIS — Z8673 Personal history of transient ischemic attack (TIA), and cerebral infarction without residual deficits: Secondary | ICD-10-CM

## 2016-04-08 DIAGNOSIS — E871 Hypo-osmolality and hyponatremia: Secondary | ICD-10-CM | POA: Diagnosis present

## 2016-04-08 DIAGNOSIS — Z86718 Personal history of other venous thrombosis and embolism: Secondary | ICD-10-CM

## 2016-04-08 DIAGNOSIS — Z7902 Long term (current) use of antithrombotics/antiplatelets: Secondary | ICD-10-CM

## 2016-04-08 DIAGNOSIS — K429 Umbilical hernia without obstruction or gangrene: Secondary | ICD-10-CM | POA: Diagnosis present

## 2016-04-08 DIAGNOSIS — Z853 Personal history of malignant neoplasm of breast: Secondary | ICD-10-CM

## 2016-04-08 DIAGNOSIS — I110 Hypertensive heart disease with heart failure: Secondary | ICD-10-CM | POA: Diagnosis present

## 2016-04-08 DIAGNOSIS — R1110 Vomiting, unspecified: Secondary | ICD-10-CM | POA: Diagnosis not present

## 2016-04-08 DIAGNOSIS — K5790 Diverticulosis of intestine, part unspecified, without perforation or abscess without bleeding: Secondary | ICD-10-CM | POA: Diagnosis present

## 2016-04-08 DIAGNOSIS — Z803 Family history of malignant neoplasm of breast: Secondary | ICD-10-CM

## 2016-04-08 DIAGNOSIS — E119 Type 2 diabetes mellitus without complications: Secondary | ICD-10-CM | POA: Diagnosis present

## 2016-04-08 DIAGNOSIS — K8020 Calculus of gallbladder without cholecystitis without obstruction: Secondary | ICD-10-CM | POA: Diagnosis present

## 2016-04-08 LAB — URINALYSIS COMPLETE WITH MICROSCOPIC (ARMC ONLY)
BACTERIA UA: NONE SEEN
BILIRUBIN URINE: NEGATIVE
GLUCOSE, UA: NEGATIVE mg/dL
HGB URINE DIPSTICK: NEGATIVE
KETONES UR: NEGATIVE mg/dL
Leukocytes, UA: NEGATIVE
NITRITE: NEGATIVE
Protein, ur: 100 mg/dL — AB
Specific Gravity, Urine: 1.01 (ref 1.005–1.030)
pH: 7 (ref 5.0–8.0)

## 2016-04-08 LAB — CBC
HCT: 30.4 % — ABNORMAL LOW (ref 35.0–47.0)
HEMOGLOBIN: 10.6 g/dL — AB (ref 12.0–16.0)
MCH: 29.8 pg (ref 26.0–34.0)
MCHC: 34.8 g/dL (ref 32.0–36.0)
MCV: 85.6 fL (ref 80.0–100.0)
Platelets: 233 10*3/uL (ref 150–440)
RBC: 3.55 MIL/uL — ABNORMAL LOW (ref 3.80–5.20)
RDW: 15.3 % — ABNORMAL HIGH (ref 11.5–14.5)
WBC: 6.2 10*3/uL (ref 3.6–11.0)

## 2016-04-08 LAB — COMPREHENSIVE METABOLIC PANEL
ALBUMIN: 3.6 g/dL (ref 3.5–5.0)
ALT: 11 U/L — ABNORMAL LOW (ref 14–54)
ANION GAP: 7 (ref 5–15)
AST: 22 U/L (ref 15–41)
Alkaline Phosphatase: 63 U/L (ref 38–126)
BUN: 11 mg/dL (ref 6–20)
CALCIUM: 8.7 mg/dL — AB (ref 8.9–10.3)
CHLORIDE: 99 mmol/L — AB (ref 101–111)
CO2: 27 mmol/L (ref 22–32)
Creatinine, Ser: 1.31 mg/dL — ABNORMAL HIGH (ref 0.44–1.00)
GFR calc non Af Amer: 36 mL/min — ABNORMAL LOW (ref >60)
GFR, EST AFRICAN AMERICAN: 42 mL/min — AB (ref >60)
Glucose, Bld: 143 mg/dL — ABNORMAL HIGH (ref 65–99)
POTASSIUM: 4.3 mmol/L (ref 3.5–5.1)
SODIUM: 133 mmol/L — AB (ref 135–145)
Total Bilirubin: 0.7 mg/dL (ref 0.3–1.2)
Total Protein: 7.1 g/dL (ref 6.5–8.1)

## 2016-04-08 LAB — LIPASE, BLOOD: LIPASE: 19 U/L (ref 11–51)

## 2016-04-08 LAB — TROPONIN I: Troponin I: <0.03 ng/mL (ref <0.03)

## 2016-04-08 NOTE — ED Triage Notes (Signed)
Pt presents to ED with c/o nausea and diarrhea since Monday, pt was seen by PCP Tuesday and was given "a shot for nausea" and prescription Zofran. Last dose was 17:30-18:00 tonight. Pt states med has not relieved nauseous feeling. Pt reports c/o upper abdominal pain. Pt denies blood stools. Pt alert and oriented x 4.

## 2016-04-09 ENCOUNTER — Emergency Department: Payer: Medicare Other

## 2016-04-09 ENCOUNTER — Inpatient Hospital Stay
Admission: EM | Admit: 2016-04-09 | Discharge: 2016-04-09 | DRG: 392 | Disposition: A | Discharge status: Home / Self Care | Payer: Medicare Other | Attending: Internal Medicine | Admitting: Internal Medicine

## 2016-04-09 DIAGNOSIS — Z79899 Other long term (current) drug therapy: Secondary | ICD-10-CM | POA: Diagnosis not present

## 2016-04-09 DIAGNOSIS — I110 Hypertensive heart disease with heart failure: Secondary | ICD-10-CM | POA: Diagnosis present

## 2016-04-09 DIAGNOSIS — R1114 Bilious vomiting: Secondary | ICD-10-CM | POA: Diagnosis not present

## 2016-04-09 DIAGNOSIS — R1110 Vomiting, unspecified: Secondary | ICD-10-CM

## 2016-04-09 DIAGNOSIS — K5790 Diverticulosis of intestine, part unspecified, without perforation or abscess without bleeding: Secondary | ICD-10-CM | POA: Diagnosis present

## 2016-04-09 DIAGNOSIS — I509 Heart failure, unspecified: Secondary | ICD-10-CM | POA: Diagnosis present

## 2016-04-09 DIAGNOSIS — E871 Hypo-osmolality and hyponatremia: Secondary | ICD-10-CM | POA: Diagnosis present

## 2016-04-09 DIAGNOSIS — Z86718 Personal history of other venous thrombosis and embolism: Secondary | ICD-10-CM | POA: Diagnosis not present

## 2016-04-09 DIAGNOSIS — Z8673 Personal history of transient ischemic attack (TIA), and cerebral infarction without residual deficits: Secondary | ICD-10-CM | POA: Diagnosis not present

## 2016-04-09 DIAGNOSIS — E119 Type 2 diabetes mellitus without complications: Secondary | ICD-10-CM | POA: Diagnosis present

## 2016-04-09 DIAGNOSIS — K8020 Calculus of gallbladder without cholecystitis without obstruction: Secondary | ICD-10-CM

## 2016-04-09 DIAGNOSIS — R1031 Right lower quadrant pain: Secondary | ICD-10-CM | POA: Diagnosis not present

## 2016-04-09 DIAGNOSIS — A084 Viral intestinal infection, unspecified: Secondary | ICD-10-CM | POA: Diagnosis present

## 2016-04-09 DIAGNOSIS — Z803 Family history of malignant neoplasm of breast: Secondary | ICD-10-CM | POA: Diagnosis not present

## 2016-04-09 DIAGNOSIS — R109 Unspecified abdominal pain: Secondary | ICD-10-CM | POA: Diagnosis present

## 2016-04-09 DIAGNOSIS — Z7902 Long term (current) use of antithrombotics/antiplatelets: Secondary | ICD-10-CM | POA: Diagnosis not present

## 2016-04-09 DIAGNOSIS — K429 Umbilical hernia without obstruction or gangrene: Secondary | ICD-10-CM | POA: Diagnosis present

## 2016-04-09 DIAGNOSIS — Z853 Personal history of malignant neoplasm of breast: Secondary | ICD-10-CM | POA: Diagnosis not present

## 2016-04-09 HISTORY — DX: Disorder of kidney and ureter, unspecified: N28.9

## 2016-04-09 HISTORY — DX: Acute embolism and thrombosis of unspecified deep veins of unspecified lower extremity: I82.409

## 2016-04-09 HISTORY — DX: Systemic involvement of connective tissue, unspecified: M35.9

## 2016-04-09 LAB — BASIC METABOLIC PANEL
ANION GAP: 6 (ref 5–15)
BUN: 9 mg/dL (ref 6–20)
CALCIUM: 8.6 mg/dL — AB (ref 8.9–10.3)
CHLORIDE: 99 mmol/L — AB (ref 101–111)
CO2: 26 mmol/L (ref 22–32)
Creatinine, Ser: 1.24 mg/dL — ABNORMAL HIGH (ref 0.44–1.00)
GFR calc non Af Amer: 39 mL/min — ABNORMAL LOW (ref >60)
GFR, EST AFRICAN AMERICAN: 45 mL/min — AB (ref >60)
Glucose, Bld: 131 mg/dL — ABNORMAL HIGH (ref 65–99)
POTASSIUM: 4.1 mmol/L (ref 3.5–5.1)
Sodium: 131 mmol/L — ABNORMAL LOW (ref 135–145)

## 2016-04-09 LAB — CBC
HEMATOCRIT: 30.1 % — AB (ref 35.0–47.0)
HEMOGLOBIN: 10.3 g/dL — AB (ref 12.0–16.0)
MCH: 29.5 pg (ref 26.0–34.0)
MCHC: 34.2 g/dL (ref 32.0–36.0)
MCV: 86.4 fL (ref 80.0–100.0)
Platelets: 219 10*3/uL (ref 150–440)
RBC: 3.49 MIL/uL — AB (ref 3.80–5.20)
RDW: 15.1 % — ABNORMAL HIGH (ref 11.5–14.5)
WBC: 6.7 10*3/uL (ref 3.6–11.0)

## 2016-04-09 MED ORDER — TRAMADOL HCL 50 MG PO TABS: 50.0000 mg | ORAL_TABLET | Freq: Two times a day (BID) | ORAL | 0 refills | Status: DC | PRN

## 2016-04-09 MED ORDER — HYDRALAZINE HCL 25 MG PO TABS
25.0000 mg | ORAL_TABLET | Freq: Three times a day (TID) | ORAL | Status: DC
  Administered 2016-04-09: 09:00:00 25 mg via ORAL
  Filled 2016-04-09: qty 1

## 2016-04-09 MED ORDER — MORPHINE SULFATE (PF) 2 MG/ML IV SOLN
2.0000 mg | Freq: Once | INTRAVENOUS | Status: AC
  Administered 2016-04-09: 2 mg via INTRAVENOUS

## 2016-04-09 MED ORDER — MORPHINE SULFATE (PF) 2 MG/ML IV SOLN
INTRAVENOUS | Status: AC
  Administered 2016-04-09: 2 mg via INTRAVENOUS
  Filled 2016-04-09: qty 1

## 2016-04-09 MED ORDER — ONDANSETRON HCL 4 MG/2ML IJ SOLN
4.0000 mg | Freq: Once | INTRAMUSCULAR | Status: AC
  Administered 2016-04-09: 4 mg via INTRAVENOUS

## 2016-04-09 MED ORDER — ACETAMINOPHEN 650 MG RE SUPP: 650.0000 mg | Freq: Four times a day (QID) | RECTAL | Status: DC | PRN

## 2016-04-09 MED ORDER — METOPROLOL SUCCINATE ER 50 MG PO TB24
100.0000 mg | ORAL_TABLET | Freq: Every day | ORAL | Status: DC
  Administered 2016-04-09: 09:00:00 100 mg via ORAL
  Filled 2016-04-09: qty 2

## 2016-04-09 MED ORDER — MORPHINE SULFATE (PF) 4 MG/ML IV SOLN: 4.0000 mg | Freq: Once | INTRAVENOUS | Status: DC

## 2016-04-09 MED ORDER — SODIUM CHLORIDE 0.9 % IV SOLN
INTRAVENOUS | Status: DC
  Administered 2016-04-09: 06:00:00 via INTRAVENOUS

## 2016-04-09 MED ORDER — MELATONIN 5 MG PO TABS
5.0000 mg | ORAL_TABLET | Freq: Every day | ORAL | Status: DC
  Filled 2016-04-09: qty 1

## 2016-04-09 MED ORDER — PRAVASTATIN SODIUM 40 MG PO TABS: 80.0000 mg | ORAL_TABLET | Freq: Every evening | ORAL | Status: DC

## 2016-04-09 MED ORDER — MORPHINE SULFATE (PF) 2 MG/ML IV SOLN
2.0000 mg | INTRAVENOUS | Status: DC | PRN
Start: 2016-04-09 — End: 2016-04-09

## 2016-04-09 MED ORDER — ONDANSETRON HCL 4 MG PO TABS: 4.0000 mg | ORAL_TABLET | Freq: Four times a day (QID) | ORAL | Status: DC | PRN

## 2016-04-09 MED ORDER — GABAPENTIN 300 MG PO CAPS
600.0000 mg | ORAL_CAPSULE | Freq: Three times a day (TID) | ORAL | Status: DC
  Administered 2016-04-09: 09:00:00 600 mg via ORAL
  Filled 2016-04-09: qty 2

## 2016-04-09 MED ORDER — ONDANSETRON HCL 4 MG/2ML IJ SOLN: 4.0000 mg | Freq: Four times a day (QID) | INTRAMUSCULAR | Status: DC | PRN

## 2016-04-09 MED ORDER — IOPAMIDOL (ISOVUE-300) INJECTION 61%
75.0000 mL | Freq: Once | INTRAVENOUS | Status: AC | PRN
  Administered 2016-04-09: 75 mL via INTRAVENOUS

## 2016-04-09 MED ORDER — ONDANSETRON HCL 4 MG/2ML IJ SOLN
4.0000 mg | Freq: Once | INTRAMUSCULAR | Status: DC
  Filled 2016-04-09: qty 2

## 2016-04-09 MED ORDER — MAGNESIUM OXIDE 400 (241.3 MG) MG PO TABS
400.0000 mg | ORAL_TABLET | Freq: Two times a day (BID) | ORAL | Status: DC
  Administered 2016-04-09: 09:00:00 400 mg via ORAL
  Filled 2016-04-09: qty 1

## 2016-04-09 MED ORDER — HYDROCODONE-ACETAMINOPHEN 5-325 MG PO TABS
1.0000 | ORAL_TABLET | ORAL | Status: DC | PRN
  Administered 2016-04-09: 2 via ORAL
  Filled 2016-04-09: qty 2

## 2016-04-09 MED ORDER — IOPAMIDOL (ISOVUE-300) INJECTION 61%
30.0000 mL | Freq: Once | INTRAVENOUS | Status: AC | PRN
  Administered 2016-04-09: 30 mL via ORAL

## 2016-04-09 MED ORDER — PANTOPRAZOLE SODIUM 40 MG PO TBEC
40.0000 mg | DELAYED_RELEASE_TABLET | Freq: Every day | ORAL | Status: DC
  Administered 2016-04-09: 09:00:00 40 mg via ORAL
  Filled 2016-04-09: qty 1

## 2016-04-09 MED ORDER — ACETAMINOPHEN 325 MG PO TABS: 650.0000 mg | ORAL_TABLET | Freq: Four times a day (QID) | ORAL | Status: DC | PRN

## 2016-04-09 MED ORDER — PANTOPRAZOLE SODIUM 40 MG PO TBEC: 40.0000 mg | DELAYED_RELEASE_TABLET | Freq: Two times a day (BID) | ORAL | 0 refills | Status: DC

## 2016-04-09 MED ORDER — MORPHINE SULFATE (PF) 2 MG/ML IV SOLN: 2.0000 mg | Freq: Once | INTRAVENOUS | Status: DC

## 2016-04-09 MED ORDER — MORPHINE SULFATE (PF) 2 MG/ML IV SOLN: 2.0000 mg | INTRAVENOUS | Status: DC | PRN

## 2016-04-09 MED ORDER — MORPHINE SULFATE (PF) 4 MG/ML IV SOLN
4.0000 mg | Freq: Once | INTRAVENOUS | Status: AC
Start: 2016-04-09 — End: 2016-04-09
  Administered 2016-04-09: 4 mg via INTRAVENOUS
  Filled 2016-04-09: qty 1

## 2016-04-09 MED ORDER — OMEGA-3-ACID ETHYL ESTERS 1 G PO CAPS
1.0000 | ORAL_CAPSULE | Freq: Two times a day (BID) | ORAL | Status: DC
  Administered 2016-04-09: 09:00:00 1 g via ORAL
  Filled 2016-04-09: qty 1

## 2016-04-09 NOTE — Care Management (Signed)
Admitted to [facility] with the diagnosis of abdominal pain. Lives with granddaughter, Catha Brow [phone]). Takes care of all basic and instrumental activities of daily living herself, drives. Last seen Dr. Amado Nash [facility] Nurse Practioner on Monday. Uses no aids for ambulation. No home health. No skilled facility. No home oxygen. No falls. Decreased appetite.  Granddaughter will transport. [name] RN MSN CCM Care Management [phone]

## 2016-04-09 NOTE — H&P (Signed)
Kristen Barron at [place] NAME: Kristen Barron    MR#:  [id number]  DATE OF BIRTH:  1929-03-03  DATE OF ADMISSION:  04/09/2016  PRIMARY CARE PHYSICIAN: Baltazar Apo, MD   REQUESTING/REFERRING PHYSICIAN:   CHIEF COMPLAINT:   Chief Complaint  Patient presents with  . Nausea  . Diarrhea    HISTORY OF PRESENT ILLNESS: [name]  is a 82 y.o. female with a known history of Arthritis, right breast cancer, mastectomy, congestive heart failure, diabetes mellitus, DVT, hypertension, CVA presented to the emergency room with abdominal pain and nausea and vomiting. Abdominal pain is present on and off for the last 1 week . Abdominal pain is located in the right upper quadrant and extends up to the left upper quadrant. No sharp in nature 4 out of 10 on a scale of 1-10. Has nausea and vomiting. No history of any blood in the vomitus. No complaints of any rectal bleeding. Patient was evaluated in the emergency room with abdominal ultrasound which showed gallstones but no evidence of cholecystitis. Her liver function tests were normal in the workup. No fever or chills. Surgical consultation was done by ER attending and was seen by the patient and recommended pain management and laparoscopic cholecystectomy deferred for now as patient is on Plavix. No complaints of chest pain and any shortness of breath. Hospitalist service was consulted for further care of the patient.  PAST MEDICAL HISTORY:   Past Medical History:  Diagnosis Date  . Arthritis   . Breast cancer ([place]) 03/06/2014   right breast with mastectomy and chemo  . Cancer [facility])    Reports lumpectomy for a cyst  . CHF (congestive heart failure) ([place])   . Collagen vascular disease ([place])   . Diabetes ([place])   . DVT (deep venous thrombosis) ([place])   . Hemorrhoids   . Hypertension   . Renal disorder   . Renal insufficiency   . Stroke [facility])    August 2014, but has had strokes prior as well    PAST  SURGICAL HISTORY: Past Surgical History:  Procedure Laterality Date  . ABDOMINAL HYSTERECTOMY     Patient not clear as to why  . BACK SURGERY    . BREAST BIOPSY Right February 15 2014   invasive mammary carcinoma/ER/PR negative, HER 2 positive  . BREAST BIOPSY Right 1999   negative biopsy  . BREAST SURGERY Right 03/06/14   mastectomy  . HAND SURGERY Right    Carpel tunnel release in the 1970s  . LUMBAR LAMINECTOMY/DECOMPRESSION MICRODISCECTOMY Bilateral 07/24/2013   Procedure: LUMBAR LAMINECTOMY/DECOMPRESSION MICRODISCECTOMY LUMBAR THREE-FOUR;  Surgeon: Charlie Pitter, MD;  Location: [facility];  Service: Neurosurgery;  Laterality: Bilateral;  . MASTECTOMY Right 03/06/2014   right br ca    SOCIAL HISTORY:  Social History  Substance Use Topics  . Smoking status: Never Smoker  . Smokeless tobacco: Never Used  . Alcohol use No    FAMILY HISTORY:  Family History  Problem Relation Age of Onset  . Cancer Sister 5    breast  . Breast cancer Sister 64  . Cancer Brother     kidney  . Cancer Maternal Aunt     breast  . Breast cancer Maternal Aunt 70  . Cancer Other     maternal niece with breast cancer  . Breast cancer Other   . Cancer Sister     pancreatic    DRUG ALLERGIES: No Known Allergies  REVIEW OF  SYSTEMS:   CONSTITUTIONAL: No fever, fatigue or weakness.  EYES: No blurred or double vision.  EARS, NOSE, AND THROAT: No tinnitus or ear pain.  RESPIRATORY: No cough, shortness of breath, wheezing or hemoptysis.  CARDIOVASCULAR: No chest pain, orthopnea, edema.  GASTROINTESTINAL: Has nausea, vomiting,  abdominal pain.  No diarrhoea. GENITOURINARY: No dysuria, hematuria.  ENDOCRINE: No polyuria, nocturia,  HEMATOLOGY: No anemia, easy bruising or bleeding SKIN: No rash or lesion. MUSCULOSKELETAL: No joint pain or arthritis.   NEUROLOGIC: No tingling, numbness, weakness.  PSYCHIATRY: No anxiety or depression.   MEDICATIONS AT HOME:  Prior to Admission medications    Medication Sig Start Date End Date Taking? Authorizing Provider  acetaminophen (TYLENOL) 500 MG tablet Take 500 mg by mouth every 6 (six) hours as needed.    Historical Provider, MD  clopidogrel (PLAVIX) 75 MG tablet Take 75 mg by mouth daily with breakfast.    Historical Provider, MD  gabapentin (NEURONTIN) 300 MG capsule Take 600 mg by mouth 3 (three) times daily.  12/04/13   Historical Provider, MD  Glucosamine-Chondroit-Vit C-Mn (GLUCOSAMINE 1500 COMPLEX PO) Take by mouth 3 (three) times daily.    Historical Provider, MD  hydrALAZINE (APRESOLINE) 25 MG tablet Take 25 mg by mouth 3 (three) times daily.    Historical Provider, MD  hydrALAZINE (APRESOLINE) 50 MG tablet  10/31/15   Historical Provider, MD  HYDROcodone-acetaminophen (NORCO/VICODIN) 5-325 MG per tablet Take 1 tablet by mouth every 6 (six) hours as needed.  02/16/14   Historical Provider, MD  magnesium oxide (MAG-OX) 400 MG tablet Take 400 mg by mouth 2 (two) times daily.    Historical Provider, MD  Melatonin 5 MG CAPS Take 5 mg by mouth at bedtime.    Historical Provider, MD  metoprolol succinate (TOPROL-XL) 100 MG 24 hr tablet Take 100 mg by mouth daily. Take with or immediately following a meal.    Historical Provider, MD  Omega 3 1200 MG CAPS Take by mouth 2 (two) times daily.    Historical Provider, MD  pantoprazole (PROTONIX) 40 MG tablet Take 1 tablet (40 mg total) by mouth daily. 07/26/13   Pollie Friar, MD  pravastatin (PRAVACHOL) 80 MG tablet Take 80 mg by mouth every evening.     Historical Provider, MD  torsemide (DEMADEX) 20 MG tablet Take 20 mg by mouth 2 (two) times daily.    Historical Provider, MD      PHYSICAL EXAMINATION:   VITAL SIGNS: Blood pressure (!) 163/87, pulse 85, temperature 97.9 F (36.6 C), temperature source Oral, resp. rate 16, height 5\' 3"  (1.6 m), weight 89.8 kg (198 lb), SpO2 96 %.  GENERAL:  82 y.o.-year-old patient lying in the bed with no acute distress.  EYES: Pupils equal, round, reactive  to light and accommodation. No scleral icterus. Extraocular muscles intact.  HEENT: Head atraumatic, normocephalic. Oropharynx dryand nasopharynx clear.  NECK:  Supple, no jugular venous distention. No thyroid enlargement, no tenderness.  LUNGS: Normal breath sounds bilaterally, no wheezing, rales,rhonchi or crepitation. No use of accessory muscles of respiration.  CARDIOVASCULAR: S1, S2 normal. No murmurs, rubs, or gallops.  ABDOMEN: Soft, mild tenderness in right upper quadrant, nondistended. Bowel sounds present. No organomegaly or mass.  EXTREMITIES: No pedal edema, cyanosis, or clubbing.  NEUROLOGIC: Cranial nerves II through XII are intact. Muscle strength 5/5 in all extremities. Sensation intact. Gait not checked.  PSYCHIATRIC: The patient is alert and oriented x 3.  SKIN: No obvious rash, lesion, or ulcer.   LABORATORY PANEL:  CBC  Recent Labs Lab 04/08/16 2117  WBC 6.2  HGB 10.6*  HCT 30.4*  PLT 233  MCV 85.6  MCH 29.8  MCHC 34.8  RDW 15.3*   ------------------------------------------------------------------------------------------------------------------  Chemistries   Recent Labs Lab 04/08/16 2117  NA 133*  K 4.3  CL 99*  CO2 27  GLUCOSE 143*  BUN 11  CREATININE 1.31*  CALCIUM 8.7*  AST 22  ALT 11*  ALKPHOS 63  BILITOT 0.7   ------------------------------------------------------------------------------------------------------------------ estimated creatinine clearance is 34 mL/min (by C-G formula based on SCr of 1.31 mg/dL). ------------------------------------------------------------------------------------------------------------------ No results for input(s): TSH, T4TOTAL, T3FREE, THYROIDAB in the last 72 hours.  Invalid input(s): FREET3   Coagulation profile No results for input(s): INR, PROTIME in the last 168 hours. ------------------------------------------------------------------------------------------------------------------- No results  for input(s): DDIMER in the last 72 hours. -------------------------------------------------------------------------------------------------------------------  Cardiac Enzymes  Recent Labs Lab 04/08/16 2117  TROPONINI <0.03   ------------------------------------------------------------------------------------------------------------------ Invalid input(s): POCBNP  ---------------------------------------------------------------------------------------------------------------  Urinalysis    Component Value Date/Time   COLORURINE YELLOW (A) 04/08/2016 2118   APPEARANCEUR CLEAR (A) 04/08/2016 2118   APPEARANCEUR Clear 11/28/2014 1616   LABSPEC 1.010 04/08/2016 2118   LABSPEC 1.008 11/28/2014 1616   PHURINE 7.0 04/08/2016 2118   GLUCOSEU NEGATIVE 04/08/2016 2118   GLUCOSEU Negative 11/28/2014 1616   HGBUR NEGATIVE 04/08/2016 2118   BILIRUBINUR NEGATIVE 04/08/2016 2118   BILIRUBINUR Negative 11/28/2014 1616   KETONESUR NEGATIVE 04/08/2016 2118   PROTEINUR 100 (A) 04/08/2016 2118   UROBILINOGEN 0.2 09/11/2013 1037   NITRITE NEGATIVE 04/08/2016 2118   LEUKOCYTESUR NEGATIVE 04/08/2016 2118   LEUKOCYTESUR Trace 11/28/2014 1616     RADIOLOGY: Ct Abdomen Pelvis W Contrast  Result Date: 04/09/2016 CLINICAL DATA:  Abdominal pain. Cholelithiasis. Left lower quadrant pain. Vomiting and nausea. Diarrhea. EXAM: CT ABDOMEN AND PELVIS WITH CONTRAST TECHNIQUE: Multidetector CT imaging of the abdomen and pelvis was performed using the standard protocol following bolus administration of intravenous contrast. CONTRAST:  73mL ISOVUE-300 IOPAMIDOL (ISOVUE-300) INJECTION 61% COMPARISON:  Abdominal ultrasound 04/09/2016, CT abdomen pelvis 11/20/2013 FINDINGS: Lower chest: No pulmonary nodules. No visible pleural or pericardial effusion. Coronary artery atherosclerotic calcification. Hepatobiliary: Normal hepatic size and contours without focal liver lesion. No perihepatic ascites. No intra- or  extrahepatic biliary dilatation. There is at least 1 calcified stone within the gallbladder, which measures 5 mm. No evidence of gallbladder inflammation. Pancreas: There is advanced fatty replacement of the pancreas. No peripancreatic fluid collection. Spleen: Numerous calcified splenic granulomata. Adrenals/Urinary Tract: Normal adrenal glands. Bilateral moderate renal atrophy. Multiple exophytic hypodense lesions of the kidneys, which are too small to characterize accurately but are statistically most likely to be renal cysts. No hydronephrosis or other evidence obstructive uropathy. Stomach/Bowel: There is rectosigmoid diverticulosis without evidence of acute inflammation. No intra-abdominal fluid collection. No dilated loops of bowel to suggest obstruction. No hiatal hernia. Vascular/Lymphatic: There is atherosclerotic calcification of the abdominal aorta without aneurysm. No abdominal or pelvic adenopathy. Reproductive: Status post hysterectomy. Normal right ovary. Left ovary not visualized. No free fluid in the pelvis. Musculoskeletal: Multilevel thoracolumbar facet arthrosis and osteophytosis. No lytic or blastic osseous lesions. Disc spacer material at L4-L5. Grade 1 retrolisthesis at L3-L4. Other: No contributory non-categorized findings. IMPRESSION: 1. Cholelithiasis without evidence of acute cholecystitis. 2. Rectosigmoid diverticulosis without evidence of acute diverticulitis. 3. Aortic and coronary artery atherosclerosis. Electronically Signed   By: Ulyses Jarred M.D.   On: 04/09/2016 04:11   US Abdomen Limited Ruq  Result Date: 04/09/2016 CLINICAL DATA:  Right upper quadrant pain  for 2 days.  Vomiting. EXAM: US ABDOMEN LIMITED - RIGHT UPPER QUADRANT COMPARISON:  CT abdomen and pelvis 11/20/2013 FINDINGS: Gallbladder: A single mobile stone is demonstrated in the gallbladder measuring 1.2 cm diameter. This stone was present on previous CT scan. Murphy's sign is positive. No gallbladder wall  thickening or edema. Common bile duct: Diameter: 5 mm, normal Liver: No focal lesion identified. Within normal limits in parenchymal echogenicity. IMPRESSION: Cholelithiasis with positive Murphy's sign. Findings are nonspecific but are consistent with acute cholecystitis in the appropriate clinical setting. No bile duct dilatation. Electronically Signed   By: Lucienne Capers M.D.   On: 04/09/2016 02:00    EKG: Orders placed or performed during the hospital encounter of 04/09/16  . EKG 12-Lead  . EKG 12-Lead  . ED EKG  . ED EKG    IMPRESSION AND PLAN: 82 year old elderly Caucasian female patient with history of congestive heart failure, arthritis, diabetes mellitus, hypertension, CVA presented to the emergency room for abdominal pain. Admitting diagnosis 1. Acute abdominal pain 2. Cholelithiasis 3. Nausea and vomiting 4. Mild hyponatremia 5. Diverticulosis Treatment plan Admit patient to medical floor IV fluid hydration Pain management Hold Plavix Appreciate surgical consultation Antiemetic medication Follow-up sodium level Sequential compression devices to lower extremities for DVT prophylaxis Supportive care.  All the records are reviewed and case discussed with ED provider. Management plans discussed with the patient, family and they are in agreement.  CODE STATUS:FULL Code Status History    Date Active Date Inactive Code Status Order ID Comments User Context   07/24/2013  2:19 PM 07/26/2013  8:38 PM Full Code [id number]  Charlie Pitter, MD Inpatient   07/20/2013  1:36 PM 07/24/2013  2:19 PM Full Code [id number]  Othella Boyer, MD Inpatient       TOTAL TIME TAKING CARE OF THIS PATIENT: 50 minutes.    Saundra Shelling M.D on 04/09/2016 at 4:25 AM  Between 7am to 6pm - Pager - [phone]  After 6pm go to [email or web address] - password [email or web address] [facility]  Office  [phone]  CC: Primary care physician; Baltazar Apo, MD

## 2016-04-09 NOTE — ED Provider Notes (Signed)
[facility] Emergency Department Provider Note  ____________________________________________   First MD Initiated Contact with Patient 04/09/16 0040     (approximate)  I have reviewed the triage vital signs and the nursing notes.   HISTORY  Chief Complaint Nausea and Diarrhea    HPI Kristen Barron is a 82 y.o. female presents with upper abdominal pain is currently 10 out of 10 associated with nausea and diarrhea 3 days. Patient states that she was seen by her primary care provider on Tuesday and given a "shot for nausea however symptoms have persisted and worsened since then. Patient denies any fever afebrile on presentation temperature 97.9   Past Medical History:  Diagnosis Date  . Arthritis   . Breast cancer ([place]) 03/06/2014   right breast with mastectomy and chemo  . Cancer [facility])    Reports lumpectomy for a cyst  . CHF (congestive heart failure) ([place])   . Collagen vascular disease ([place])   . Diabetes ([place])   . DVT (deep venous thrombosis) ([place])   . Hemorrhoids   . Hypertension   . Renal disorder   . Renal insufficiency   . Stroke [facility])    August 2014, but has had strokes prior as well    Patient Active Problem List   Diagnosis Date Noted  . Right lower quadrant pain   . Vomiting   . Gallstone   . Osteoarthritis of knee (Bilateral) (L>R) 12/26/2015  . Chronic pain 12/11/2015  . Long term current use of opiate analgesic 12/11/2015  . Encounter for therapeutic drug level monitoring 12/11/2015  . Chronic knee pain (Location of Primary Source of Pain) (Bilateral) (L>R) 12/11/2015  . Long term prescription opiate use 12/11/2015  . Opiate use (20 MME/Day) 12/11/2015  . Chronic hand pain (Location of Secondary source of pain) (Bilateral) (L>R) 12/11/2015  . Failed back surgical syndrome x 2 (Surgeon: Dr. Deri Fuelling) 12/11/2015  . Lumbar spondylosis 12/11/2015  . Chronic low back pain (Location of Tertiary source of pain) (Bilateral) (L>R)  12/11/2015  . Lumbar facet syndrome 12/11/2015  . Chronic lower extremity pain (Bilateral) (L>R) 12/11/2015  . Chronic neck pain (Left) 12/11/2015  . Cervical spondylosis 12/11/2015  . Chronic shoulder pain (Right) 12/11/2015  . Insulin dependent diabetes mellitus ([place]) 12/11/2015  . Abnormal MRI, lumbar spine (07/19/2013) 12/11/2015  . Lumbar postlaminectomy syndrome (L4-5) 12/11/2015  . Fusion of lumbar spine (L4-5 Ray cages) 12/11/2015  . Lumbar Levoscoliosis with apex at L4 12/11/2015  . Grade 1 Retrolisthesis of L2 over L3 and L3 over L4 12/11/2015  . Lumbar facet arthropathy 12/11/2015  . Lumbar foraminal stenosis (Bilateral L2-3, L3-4 & Left L5-S1) 12/11/2015  . Chronic anticoagulation (Plavix) 12/11/2015  . Breast cancer of lower-outer quadrant of right female breast ([place]) 02/28/2014  . Lumbar stenosis with neurogenic claudication 07/24/2013  . Lumbar central spinal stenosis ( Severe at L3-4) (Mild R>L L2-3 & L5-S1) 07/20/2013  . Hyponatremia 07/20/2013  . Hypertension 07/20/2013  . Normocytic anemia 07/20/2013  . CKD (chronic kidney disease) stage 3, GFR 30-59 ml/min 07/20/2013  .  Lumbar DDD (degenerative disc disease) 07/20/2013    Past Surgical History:  Procedure Laterality Date  . ABDOMINAL HYSTERECTOMY     Patient not clear as to why  . BACK SURGERY    . BREAST BIOPSY Right February 15 2014   invasive mammary carcinoma/ER/PR negative, HER 2 positive  . BREAST BIOPSY Right 1999   negative biopsy  . BREAST SURGERY Right 03/06/14   mastectomy  .  HAND SURGERY Right    Carpel tunnel release in the 1970s  . LUMBAR LAMINECTOMY/DECOMPRESSION MICRODISCECTOMY Bilateral 07/24/2013   Procedure: LUMBAR LAMINECTOMY/DECOMPRESSION MICRODISCECTOMY LUMBAR THREE-FOUR;  Surgeon: Charlie Pitter, MD;  Location: [place] [facility];  Service: Neurosurgery;  Laterality: Bilateral;  . MASTECTOMY Right 03/06/2014   right br ca    Prior to Admission medications   Medication Sig Start Date End Date  Taking? Authorizing Provider  acetaminophen (TYLENOL) 500 MG tablet Take 500 mg by mouth every 6 (six) hours as needed.    Historical Provider, MD  clopidogrel (PLAVIX) 75 MG tablet Take 75 mg by mouth daily with breakfast.    Historical Provider, MD  gabapentin (NEURONTIN) 300 MG capsule Take 600 mg by mouth 3 (three) times daily.  12/04/13   Historical Provider, MD  Glucosamine-Chondroit-Vit C-Mn (GLUCOSAMINE 1500 COMPLEX PO) Take by mouth 3 (three) times daily.    Historical Provider, MD  hydrALAZINE (APRESOLINE) 25 MG tablet Take 25 mg by mouth 3 (three) times daily.    Historical Provider, MD  hydrALAZINE (APRESOLINE) 50 MG tablet  10/31/15   Historical Provider, MD  HYDROcodone-acetaminophen (NORCO/VICODIN) 5-325 MG per tablet Take 1 tablet by mouth every 6 (six) hours as needed.  02/16/14   Historical Provider, MD  magnesium oxide (MAG-OX) 400 MG tablet Take 400 mg by mouth 2 (two) times daily.    Historical Provider, MD  Melatonin 5 MG CAPS Take 5 mg by mouth at bedtime.    Historical Provider, MD  metoprolol succinate (TOPROL-XL) 100 MG 24 hr tablet Take 100 mg by mouth daily. Take with or immediately following a meal.    Historical Provider, MD  Omega 3 1200 MG CAPS Take by mouth 2 (two) times daily.    Historical Provider, MD  pantoprazole (PROTONIX) 40 MG tablet Take 1 tablet (40 mg total) by mouth daily. 07/26/13   Pollie Friar, MD  pravastatin (PRAVACHOL) 80 MG tablet Take 80 mg by mouth every evening.     Historical Provider, MD  torsemide (DEMADEX) 20 MG tablet Take 20 mg by mouth 2 (two) times daily.    Historical Provider, MD    Allergies No known drug allergies  Family History  Problem Relation Age of Onset  . Cancer Sister 6    breast  . Breast cancer Sister 78  . Cancer Brother     kidney  . Cancer Maternal Aunt     breast  . Breast cancer Maternal Aunt 70  . Cancer Other     maternal niece with breast cancer  . Breast cancer Other   . Cancer Sister      pancreatic    Social History Social History  Substance Use Topics  . Smoking status: Never Smoker  . Smokeless tobacco: Never Used  . Alcohol use No    Review of Systems Constitutional: No fever/chills Eyes: No visual changes. ENT: No sore throat. Cardiovascular: Denies chest pain. Respiratory: Denies shortness of breath. Gastrointestinal: Positive for abdominal pain and vomiting and diarrhea  Genitourinary: Negative for dysuria. Musculoskeletal: Negative for back pain. Skin: Negative for rash. Neurological: Negative for headaches, focal weakness or numbness.  10-point ROS otherwise negative.  ____________________________________________   PHYSICAL EXAM:  VITAL SIGNS: ED Triage Vitals  Enc Vitals Group     BP 04/08/16 2113 (!) 183/84     Pulse Rate 04/08/16 2113 77     Resp 04/08/16 2113 18     Temp 04/08/16 2113 97.9 F (36.6 C)  Temp Source 04/08/16 2113 Oral     SpO2 04/08/16 2113 96 %     Weight 04/08/16 2113 198 lb (89.8 kg)     Height 04/08/16 2113 5\' 3"  (1.6 m)     Head Circumference --      Peak Flow --      Pain Score 04/08/16 2114 6     Pain Loc --      Pain Edu? --      Excl. in [place]? --     Constitutional: Alert and oriented. Apparent discomfort Eyes: Conjunctivae are normal. PERRL. EOMI. Head: Atraumatic. Mouth/Throat: Mucous membranes are moist.  Oropharynx non-erythematous. Neck: No stridor.  No meningeal signs.   Cardiovascular: Normal rate, regular rhythm. Good peripheral circulation. Grossly normal heart sounds. Respiratory: Normal respiratory effort.  No retractions. Lungs CTAB. Gastrointestinal: Right upper quadrant/left lower quadrant tenderness to palpation No distention.  Musculoskeletal: No lower extremity tenderness nor edema. No gross deformities of extremities. Neurologic:  Normal speech and language. No gross focal neurologic deficits are appreciated.  Skin:  Skin is warm, dry and intact. No rash noted. Psychiatric: Mood and  affect are normal. Speech and behavior are normal.  ____________________________________________   LABS (all labs ordered are listed, but only abnormal results are displayed)  Labs Reviewed  COMPREHENSIVE METABOLIC PANEL - Abnormal; Notable for the following:       Result Value   Sodium 133 (*)    Chloride 99 (*)    Glucose, Bld 143 (*)    Creatinine, Ser 1.31 (*)    Calcium 8.7 (*)    ALT 11 (*)    GFR calc non Af Amer 36 (*)    GFR calc Af Amer 42 (*)    All other components within normal limits  CBC - Abnormal; Notable for the following:    RBC 3.55 (*)    Hemoglobin 10.6 (*)    HCT 30.4 (*)    RDW 15.3 (*)    All other components within normal limits  URINALYSIS COMPLETEWITH MICROSCOPIC ([facility] ONLY) - Abnormal; Notable for the following:    Color, Urine YELLOW (*)    APPearance CLEAR (*)    Protein, ur 100 (*)    Squamous Epithelial / LPF 0-5 (*)    All other components within normal limits  LIPASE, BLOOD  TROPONIN I   ____________________________________________  EKG  ED ECG REPORT I, [place] N BROWN, the attending physician, personally viewed and interpreted this ECG.   Date: 04/09/2016  EKG Time: 9:16 PM  Rate: 76  Rhythm: Normal sinus rhythm  Axis: Normal  Intervals: Normal  ST&T Change: None  ____________________________________________  RADIOLOGY I, [place] N BROWN, personally viewed and evaluated these images (plain radiographs) as part of my medical decision making, as well as reviewing the written report by the radiologist.  US Abdomen Limited Ruq  Result Date: 04/09/2016 CLINICAL DATA:  Right upper quadrant pain for 2 days.  Vomiting. EXAM: US ABDOMEN LIMITED - RIGHT UPPER QUADRANT COMPARISON:  CT abdomen and pelvis 11/20/2013 FINDINGS: Gallbladder: A single mobile stone is demonstrated in the gallbladder measuring 1.2 cm diameter. This stone was present on previous CT scan. Murphy's sign is positive. No gallbladder wall thickening or edema.  Common bile duct: Diameter: 5 mm, normal Liver: No focal lesion identified. Within normal limits in parenchymal echogenicity. IMPRESSION: Cholelithiasis with positive Murphy's sign. Findings are nonspecific but are consistent with acute cholecystitis in the appropriate clinical setting. No bile duct dilatation. Electronically Signed   By:  Lucienne Capers M.D.   On: 04/09/2016 02:00    Procedures   _   INITIAL IMPRESSION / ASSESSMENT AND PLAN / ED COURSE  Pertinent labs & imaging results that were available during my care of the patient were reviewed by me and considered in my medical decision making (see chart for details).  Patient given multiple doses of IV morphine with continued discomfort although her pain is improved to discuss still currently 7 out of 10. Patient discussed with Dr. Burt Knack general surgeon on call who will consult on the patient however recommends hospitalist admission. As such patient discussed with Dr. Estanislado Pandy hospitals on call who will admit the patient.   Clinical Course    ____________________________________________  FINAL CLINICAL IMPRESSION(S) / ED DIAGNOSES  Final diagnoses:  Vomiting  Right lower quadrant pain  Calculus of gallbladder without cholecystitis without obstruction     MEDICATIONS GIVEN DURING THIS VISIT:  Medications  morphine 2 MG/ML injection 2 mg (2 mg Intravenous Given 04/09/16 0108)  ondansetron (ZOFRAN) injection 4 mg (4 mg Intravenous Given 04/09/16 0107)  iopamidol (ISOVUE-300) 61 % injection 30 mL (30 mLs Oral Contrast Given 04/09/16 0244)  morphine 4 MG/ML injection 4 mg (4 mg Intravenous Given 04/09/16 0256)  iopamidol (ISOVUE-300) 61 % injection 75 mL (75 mLs Intravenous Contrast Given 04/09/16 0348)     NEW OUTPATIENT MEDICATIONS STARTED DURING THIS VISIT:  New Prescriptions   No medications on file      Note:  This document was prepared using Dragon voice recognition software and may include unintentional  dictation errors.    Gregor Hams, MD 04/09/16 [phone]

## 2016-04-09 NOTE — Care Management Important Message (Signed)
Important Message  Patient Details  Name: Kristen Barron MRN: [id number] Date of Birth: [birth year]   Medicare Important Message Given:  Yes    [name], RN 04/09/2016, 8:20 AM

## 2016-04-09 NOTE — Discharge Instructions (Signed)
°  DIET:  Diabetic diet  DISCHARGE CONDITION:  Stable  ACTIVITY:  Activity as tolerated  OXYGEN:  Home Oxygen: No.   Oxygen Delivery: room air  DISCHARGE LOCATION:  home   If you experience worsening of your admission symptoms, develop shortness of breath, life threatening emergency, suicidal or homicidal thoughts you must seek medical attention immediately by calling 911 or calling your MD immediately  if symptoms less severe.  You Must read complete instructions/literature along with all the possible adverse reactions/side effects for all the Medicines you take and that have been prescribed to you. Take any new Medicines after you have completely understood and accpet all the possible adverse reactions/side effects.   Please note  You were cared for by a hospitalist during your hospital stay. If you have any questions about your discharge medications or the care you received while you were in the hospital after you are discharged, you can call the unit and asked to speak with the hospitalist on call if the hospitalist that took care of you is not available. Once you are discharged, your primary care physician will handle any further medical issues. Please note that NO REFILLS for any discharge medications will be authorized once you are discharged, as it is imperative that you return to your primary care physician (or establish a relationship with a primary care physician if you do not have one) for your aftercare needs so that they can reassess your need for medications and monitor your lab values.   Take baby aspirin everyday. Do not use higher dose aspirin for pain.

## 2016-04-09 NOTE — Progress Notes (Signed)
Discharged to home. MD follow up appt made for patient. Prescriptions given to patient. Discharge instructions given to patient. Vitals stable, IV discontinued and removed.  Pt left unit with husband and volunteer via wheelchair.

## 2016-04-09 NOTE — Consult Note (Signed)
Surgical Consultation  04/09/2016  Kristen Barron is an 82 y.o. female.   CC: Left lower quadrant abdominal pain  HPI: This a patient with 2 days and possibly longer of abdominal pain. I was asked see the patient for gallbladder disease as she has had an ultrasound that shows stones. However when I met the patient in the emergency room she was laying on her right side and I asked where her pain was she used her left hand 0.2 her left lower quadrant and denied right upper quadrant pain. She states that she is vomited and has some nausea and has been having diarrhea. She is vague about how long this been going on but it may have been going on longer than 2 or 3 days. She has had this pain before and again denies right upper quadrant pain  Past Medical History:  Diagnosis Date  . Arthritis   . Breast cancer ([employer]) 03/06/2014   right breast with mastectomy and chemo  . Cancer [facility])    Reports lumpectomy for a cyst  . CHF (congestive heart failure) ([place])   . Diabetes ([place])   . DVT (deep venous thrombosis) ([place])   . Hemorrhoids   . Hypertension   . Renal disorder   . Stroke [facility])    August 2014, but has had strokes prior as well    Past Surgical History:  Procedure Laterality Date  . ABDOMINAL HYSTERECTOMY     Patient not clear as to why  . BACK SURGERY    . BREAST BIOPSY Right February 15 2014   invasive mammary carcinoma/ER/PR negative, HER 2 positive  . BREAST BIOPSY Right 1999   negative biopsy  . BREAST SURGERY Right 03/06/14   mastectomy  . HAND SURGERY Right    Carpel tunnel release in the 1970s  . LUMBAR LAMINECTOMY/DECOMPRESSION MICRODISCECTOMY Bilateral 07/24/2013   Procedure: LUMBAR LAMINECTOMY/DECOMPRESSION MICRODISCECTOMY LUMBAR THREE-FOUR;  Surgeon: Charlie Pitter, MD;  Location: [place] [facility];  Service: Neurosurgery;  Laterality: Bilateral;  . MASTECTOMY Right 03/06/2014   right br ca    Family History  Problem Relation Age of Onset  . Cancer Sister 19    breast  . Breast  cancer Sister 85  . Cancer Brother     kidney  . Cancer Maternal Aunt     breast  . Breast cancer Maternal Aunt 70  . Cancer Other     maternal niece with breast cancer  . Breast cancer Other   . Cancer Sister     pancreatic    Social History:  reports that she has never smoked. She has never used smokeless tobacco. She reports that she does not drink alcohol or use drugs.  Allergies: No Known Allergies  Medications reviewed.   Review of Systems:   Review of Systems  Constitutional: Negative for chills and fever.  HENT: Negative.   Eyes: Negative.   Respiratory: Negative.   Cardiovascular: Negative.   Gastrointestinal: Positive for abdominal pain, diarrhea, nausea and vomiting. Negative for blood in stool, constipation, heartburn and melena.  Genitourinary: Negative.   Musculoskeletal: Negative.   Skin: Negative.   Neurological: Negative.   Endo/Heme/Allergies: Negative.   Psychiatric/Behavioral: Negative.      Physical Exam:  BP (!) 188/80 (BP Location: Left Arm)   Pulse 82   Temp 97.9 F (36.6 C) (Oral)   Resp 16   Ht 5' 3"  (1.6 m)   Wt 198 lb (89.8 kg)   SpO2 98%   BMI 35.07 kg/m  Physical Exam  Constitutional: She is oriented to person, place, and time. No distress.  Obese female patient no acute distress Prefers to lie on her right side  HENT:  Head: Normocephalic and atraumatic.  Eyes: Right eye exhibits no discharge. Left eye exhibits no discharge. No scleral icterus.  Neck: Normal range of motion.  Cardiovascular: Normal rate, regular rhythm and normal heart sounds.   Pulmonary/Chest: Effort normal and breath sounds normal. No respiratory distress. She has no wheezes. She has no rales.  Abdominal: Soft. She exhibits no distension. There is tenderness. There is no rebound and no guarding.  Diffuse abdominal tenderness maximal in left lower quadrant no right upper quadrant tenderness negative Murphy sign no guarding no rebound no percussion  tenderness  Large nontender umbilical hernia which is completely reducible with omentum in place but reducible with no overlying skin changes.  Musculoskeletal: Normal range of motion. She exhibits edema. She exhibits no tenderness or deformity.  Lymphadenopathy:    She has no cervical adenopathy.  Neurological: She is alert and oriented to person, place, and time.  Skin: Skin is warm and dry. No rash noted. She is not diaphoretic. No erythema.  Psychiatric: Mood and affect normal.  Vitals reviewed.     Results for orders placed or performed during the hospital encounter of 04/09/16 (from the past 48 hour(s))  Lipase, blood     Status: None   Collection Time: 04/08/16  9:17 PM  Result Value Ref Range   Lipase 19 11 - 51 U/L  Comprehensive metabolic panel     Status: Abnormal   Collection Time: 04/08/16  9:17 PM  Result Value Ref Range   Sodium 133 (L) 135 - 145 mmol/L   Potassium 4.3 3.5 - 5.1 mmol/L   Chloride 99 (L) 101 - 111 mmol/L   CO2 27 22 - 32 mmol/L   Glucose, Bld 143 (H) 65 - 99 mg/dL   BUN 11 6 - 20 mg/dL   Creatinine, Ser 1.31 (H) 0.44 - 1.00 mg/dL   Calcium 8.7 (L) 8.9 - 10.3 mg/dL   Total Protein 7.1 6.5 - 8.1 g/dL   Albumin 3.6 3.5 - 5.0 g/dL   AST 22 15 - 41 U/L   ALT 11 (L) 14 - 54 U/L   Alkaline Phosphatase 63 38 - 126 U/L   Total Bilirubin 0.7 0.3 - 1.2 mg/dL   GFR calc non Af Amer 36 (L) >60 mL/min   GFR calc Af Amer 42 (L) >60 mL/min    Comment: (NOTE) The eGFR has been calculated using the CKD EPI equation. This calculation has not been validated in all clinical situations. eGFR's persistently <60 mL/min signify possible Chronic Kidney Disease.    Anion gap 7 5 - 15  CBC     Status: Abnormal   Collection Time: 04/08/16  9:17 PM  Result Value Ref Range   WBC 6.2 3.6 - 11.0 K/uL   RBC 3.55 (L) 3.80 - 5.20 MIL/uL   Hemoglobin 10.6 (L) 12.0 - 16.0 g/dL   HCT 30.4 (L) 35.0 - 47.0 %   MCV 85.6 80.0 - 100.0 fL   MCH 29.8 26.0 - 34.0 pg   MCHC 34.8  32.0 - 36.0 g/dL   RDW 15.3 (H) 11.5 - 14.5 %   Platelets 233 150 - 440 K/uL  Troponin I     Status: None   Collection Time: 04/08/16  9:17 PM  Result Value Ref Range   Troponin I <0.03 <0.03 ng/mL  Urinalysis  complete, with microscopic     Status: Abnormal   Collection Time: 04/08/16  9:18 PM  Result Value Ref Range   Color, Urine YELLOW (A) YELLOW   APPearance CLEAR (A) CLEAR   Glucose, UA NEGATIVE NEGATIVE mg/dL   Bilirubin Urine NEGATIVE NEGATIVE   Ketones, ur NEGATIVE NEGATIVE mg/dL   Specific Gravity, Urine 1.010 1.005 - 1.030   Hgb urine dipstick NEGATIVE NEGATIVE   pH 7.0 5.0 - 8.0   Protein, ur 100 (A) NEGATIVE mg/dL   Nitrite NEGATIVE NEGATIVE   Leukocytes, UA NEGATIVE NEGATIVE   RBC / HPF 0-5 0 - 5 RBC/hpf   WBC, UA 0-5 0 - 5 WBC/hpf   Bacteria, UA NONE SEEN NONE SEEN   Squamous Epithelial / LPF 0-5 (A) NONE SEEN   US Abdomen Limited Ruq  Result Date: 04/09/2016 CLINICAL DATA:  Right upper quadrant pain for 2 days.  Vomiting. EXAM: US ABDOMEN LIMITED - RIGHT UPPER QUADRANT COMPARISON:  CT abdomen and pelvis 11/20/2013 FINDINGS: Gallbladder: A single mobile stone is demonstrated in the gallbladder measuring 1.2 cm diameter. This stone was present on previous CT scan. Murphy's sign is positive. No gallbladder wall thickening or edema. Common bile duct: Diameter: 5 mm, normal Liver: No focal lesion identified. Within normal limits in parenchymal echogenicity. IMPRESSION: Cholelithiasis with positive Murphy's sign. Findings are nonspecific but are consistent with acute cholecystitis in the appropriate clinical setting. No bile duct dilatation. Electronically Signed   By: Lucienne Capers M.D.   On: 04/09/2016 02:00    Assessment/Plan:  I was asked see the patient with left lower quadrant pain for gallbladder disease. She has known gallstones but her ultrasound shows no gallbladder wall thickening her white blood cell count is normal she has no right upper quadrant pain or  tenderness and a negative Murphy sign in spite of the ultrasound showing a positive sonographic Murphy sign. By my exam she is not tender in the right upper quadrant. The etiology of her abdominal pain is unclear and I agree with Dr. Owens Shark the CT scan is in order. With her being on Plavix she would not be a candidate for immediate laparoscopic cholecystectomy and any rate and her large umbilical hernia would need to be repaired as well. With that in mind we will consult if she is admitted by medicine and follow her in the hospital.  Florene Glen, MD, FACS

## 2016-04-10 NOTE — Discharge Summary (Signed)
[facility] [place] NAME: Kristen Barron    MR#:  [id number]  DATE OF BIRTH:  1929/04/02  DATE OF ADMISSION:  04/09/2016 ADMITTING PHYSICIAN: Saundra Shelling, MD  DATE OF DISCHARGE: 04/09/2016  2:30 PM  PRIMARY CARE PHYSICIAN: Baltazar Apo, MD   ADMISSION DIAGNOSIS:  Vomiting [R11.10] Right lower quadrant pain [R10.31] Calculus of gallbladder without cholecystitis without obstruction [K80.20]  DISCHARGE DIAGNOSIS:  Principal Problem:   Abdominal pain   SECONDARY DIAGNOSIS:   Past Medical History:  Diagnosis Date  . Arthritis   . Breast cancer ([place]) 03/06/2014   right breast with mastectomy and chemo  . Cancer [facility])    Reports lumpectomy for a cyst  . CHF (congestive heart failure) ([place])   . Collagen vascular disease ([place])   . Diabetes ([place])   . DVT (deep venous thrombosis) ([place])   . Hemorrhoids   . Hypertension   . Renal disorder   . Renal insufficiency   . Stroke [facility])    August 2014, but has had strokes prior as well     ADMITTING HISTORY  HISTORY OF PRESENT ILLNESS: Kristen Barron  is a 82 y.o. female with a known history of Arthritis, right breast cancer, mastectomy, congestive heart failure, diabetes mellitus, DVT, hypertension, CVA presented to the emergency room with abdominal pain and nausea and vomiting. Abdominal pain is present on and off for the last 1 week . Abdominal pain is located in the right upper quadrant and extends up to the left upper quadrant. No sharp in nature 4 out of 10 on a scale of 1-10. Has nausea and vomiting. No history of any blood in the vomitus. No complaints of any rectal bleeding. Patient was evaluated in the emergency room with abdominal ultrasound which showed gallstones but no evidence of cholecystitis. Her liver function tests were normal in the workup. No fever or chills. Surgical consultation was done by ER attending and was seen by the patient and recommended pain management and  laparoscopic cholecystectomy deferred for now as patient is on Plavix. No complaints of chest pain and any shortness of breath. Hospitalist service was consulted for further care of the patient.  HOSPITAL COURSE:   * Viral gastroenteritis Symptoms improved by day of discharge. She has very minimal nausea but no vomiting. Abdominal pain has resolved. No diarrhea. CT scan of the abdomen showed gallstones but no cholecystitis. Seen by surgery and no surgical procedures advised. Patient is being discharged home in a stable condition to follow-up with her primary care physician. She has tolerated a regular lunch prior to discharge.  Other medications remain the same.  CONSULTS OBTAINED:  Treatment Team:  Florene Glen, MD  DRUG ALLERGIES:  No Known Allergies  DISCHARGE MEDICATIONS:   Discharge Medication List as of 04/09/2016  2:09 PM    START taking these medications   Details  traMADol (ULTRAM) 50 MG tablet Take 1 tablet (50 mg total) by mouth every 12 (twelve) hours as needed for severe pain., Starting Thu 04/09/2016, Print      CONTINUE these medications which have CHANGED   Details  pantoprazole (PROTONIX) 40 MG tablet Take 1 tablet (40 mg total) by mouth 2 (two) times daily before a meal., Starting Thu 04/09/2016, Print      CONTINUE these medications which have NOT CHANGED   Details  acetaminophen (TYLENOL) 500 MG tablet Take 500 mg by mouth every 6 (six) hours as needed., Historical Med    clopidogrel (  PLAVIX) 75 MG tablet Take 75 mg by mouth daily with breakfast., Historical Med    gabapentin (NEURONTIN) 300 MG capsule Take 600 mg by mouth 3 (three) times daily. , Starting Mon 12/04/2013, Historical Med    Glucosamine-Chondroit-Vit C-Mn (GLUCOSAMINE 1500 COMPLEX PO) Take by mouth 3 (three) times daily., Historical Med    HYDROcodone-acetaminophen (NORCO/VICODIN) 5-325 MG per tablet Take 1 tablet by mouth every 6 (six) hours as needed. , Starting Fri 02/16/2014, Historical  Med    insulin aspart (NOVOLOG) 100 UNIT/ML injection Inject 8 Units into the skin 3 (three) times daily before meals., Historical Med    insulin detemir (LEVEMIR) 100 UNIT/ML injection Inject 18 Units into the skin at bedtime., Historical Med    magnesium oxide (MAG-OX) 400 MG tablet Take 400 mg by mouth 2 (two) times daily., Historical Med    Melatonin 5 MG TABS Take 5 mg by mouth at bedtime., Historical Med    metoprolol succinate (TOPROL-XL) 100 MG 24 hr tablet Take 100 mg by mouth daily. Take with or immediately following a meal., Historical Med    Omega 3 1200 MG CAPS Take by mouth 2 (two) times daily., Historical Med    ondansetron (ZOFRAN) 4 MG tablet Take 4 mg by mouth every 8 (eight) hours as needed for nausea or vomiting., Historical Med    pravastatin (PRAVACHOL) 80 MG tablet Take 80 mg by mouth every evening. , Historical Med    torsemide (DEMADEX) 20 MG tablet Take 20 mg by mouth 2 (two) times daily., Historical Med    hydrALAZINE (APRESOLINE) 25 MG tablet Take 25 mg by mouth 3 (three) times daily., Historical Med        Today   VITAL SIGNS:  Blood pressure (!) 150/42, pulse 78, temperature 98.2 F (36.8 C), temperature source Oral, resp. rate 18, height 5\' 3"  (1.6 m), weight 89.8 kg (198 lb), SpO2 95 %.  I/O:  No intake or output data in the 24 hours ending 04/10/16 1329  PHYSICAL EXAMINATION:  Physical Exam  GENERAL:  82 y.o.-year-old patient lying in the bed with no acute distress.  LUNGS: Normal breath sounds bilaterally, no wheezing, rales,rhonchi or crepitation. No use of accessory muscles of respiration.  CARDIOVASCULAR: S1, S2 normal. No murmurs, rubs, or gallops.  ABDOMEN: Soft, non-tender, non-distended. Bowel sounds present. No organomegaly or mass.  NEUROLOGIC: Moves all 4 extremities. PSYCHIATRIC: The patient is alert and oriented x 3.  SKIN: No obvious rash, lesion, or ulcer.   DATA REVIEW:   CBC  Recent Labs Lab 04/09/16 0549  WBC 6.7   HGB 10.3*  HCT 30.1*  PLT 219    Chemistries   Recent Labs Lab 04/08/16 2117 04/09/16 0549  NA 133* 131*  K 4.3 4.1  CL 99* 99*  CO2 27 26  GLUCOSE 143* 131*  BUN 11 9  CREATININE 1.31* 1.24*  CALCIUM 8.7* 8.6*  AST 22  --   ALT 11*  --   ALKPHOS 63  --   BILITOT 0.7  --     Cardiac Enzymes  Recent Labs Lab 04/08/16 2117  TROPONINI <0.03    Microbiology Results  Results for orders placed or performed in visit on 12/13/14  Urine culture     Status: None   Collection Time: 12/13/14 11:26 AM  Result Value Ref Range Status   Specimen Description URINE, CLEAN CATCH  Final   Special Requests NONE  Final   Report Status 12/22/2014 FINAL  Final    RADIOLOGY:  Ct Abdomen Pelvis W Contrast  Result Date: 04/09/2016 CLINICAL DATA:  Abdominal pain. Cholelithiasis. Left lower quadrant pain. Vomiting and nausea. Diarrhea. EXAM: CT ABDOMEN AND PELVIS WITH CONTRAST TECHNIQUE: Multidetector CT imaging of the abdomen and pelvis was performed using the standard protocol following bolus administration of intravenous contrast. CONTRAST:  58mL ISOVUE-300 IOPAMIDOL (ISOVUE-300) INJECTION 61% COMPARISON:  Abdominal ultrasound 04/09/2016, CT abdomen pelvis 11/20/2013 FINDINGS: Lower chest: No pulmonary nodules. No visible pleural or pericardial effusion. Coronary artery atherosclerotic calcification. Hepatobiliary: Normal hepatic size and contours without focal liver lesion. No perihepatic ascites. No intra- or extrahepatic biliary dilatation. There is at least 1 calcified stone within the gallbladder, which measures 5 mm. No evidence of gallbladder inflammation. Pancreas: There is advanced fatty replacement of the pancreas. No peripancreatic fluid collection. Spleen: Numerous calcified splenic granulomata. Adrenals/Urinary Tract: Normal adrenal glands. Bilateral moderate renal atrophy. Multiple exophytic hypodense lesions of the kidneys, which are too small to characterize accurately but are  statistically most likely to be renal cysts. No hydronephrosis or other evidence obstructive uropathy. Stomach/Bowel: There is rectosigmoid diverticulosis without evidence of acute inflammation. No intra-abdominal fluid collection. No dilated loops of bowel to suggest obstruction. No hiatal hernia. Vascular/Lymphatic: There is atherosclerotic calcification of the abdominal aorta without aneurysm. No abdominal or pelvic adenopathy. Reproductive: Status post hysterectomy. Normal right ovary. Left ovary not visualized. No free fluid in the pelvis. Musculoskeletal: Multilevel thoracolumbar facet arthrosis and osteophytosis. No lytic or blastic osseous lesions. Disc spacer material at L4-L5. Grade 1 retrolisthesis at L3-L4. Other: No contributory non-categorized findings. IMPRESSION: 1. Cholelithiasis without evidence of acute cholecystitis. 2. Rectosigmoid diverticulosis without evidence of acute diverticulitis. 3. Aortic and coronary artery atherosclerosis. Electronically Signed   By: Ulyses Jarred M.D.   On: 04/09/2016 04:11   US Abdomen Limited Ruq  Result Date: 04/09/2016 CLINICAL DATA:  Right upper quadrant pain for 2 days.  Vomiting. EXAM: US ABDOMEN LIMITED - RIGHT UPPER QUADRANT COMPARISON:  CT abdomen and pelvis 11/20/2013 FINDINGS: Gallbladder: A single mobile stone is demonstrated in the gallbladder measuring 1.2 cm diameter. This stone was present on previous CT scan. Murphy's sign is positive. No gallbladder wall thickening or edema. Common bile duct: Diameter: 5 mm, normal Liver: No focal lesion identified. Within normal limits in parenchymal echogenicity. IMPRESSION: Cholelithiasis with positive Murphy's sign. Findings are nonspecific but are consistent with acute cholecystitis in the appropriate clinical setting. No bile duct dilatation. Electronically Signed   By: Lucienne Capers M.D.   On: 04/09/2016 02:00    Follow up with PCP in 1 week.  Management plans discussed with the patient, family  and they are in agreement.  CODE STATUS:  Code Status History    Date Active Date Inactive Code Status Order ID Comments User Context   04/09/2016  5:44 AM 04/09/2016  5:32 PM Full Code [id number]  Saundra Shelling, MD Inpatient   07/24/2013  2:19 PM 07/26/2013  8:38 PM Full Code [id number]  Charlie Pitter, MD Inpatient   07/20/2013  1:36 PM 07/24/2013  2:19 PM Full Code [id number]  Othella Boyer, MD Inpatient      TOTAL TIME TAKING CARE OF THIS PATIENT ON DAY OF DISCHARGE: more than 30 minutes.  Hillary Bow R M.D on 04/10/2016 at 1:29 PM  Between 7am to 6pm - Pager - [phone]  After 6pm go to [email or web address] - password [email or web address] [facility]  Office  [phone]  CC: Primary care physician; Baltazar Apo, MD  Note: This dictation was prepared  with Dragon dictation along with smaller phrase technology. Any transcriptional errors that result from this process are unintentional.

## 2016-04-12 ENCOUNTER — Emergency Department: Payer: Medicare Other

## 2016-04-12 ENCOUNTER — Inpatient Hospital Stay
Admission: EM | Admit: 2016-04-12 | Discharge: 2016-04-16 | DRG: 392 | Disposition: A | Discharge status: Home / Self Care | Payer: Medicare Other | Attending: Internal Medicine | Admitting: Internal Medicine

## 2016-04-12 ENCOUNTER — Encounter: Payer: Self-pay | Admitting: Emergency Medicine

## 2016-04-12 DIAGNOSIS — K589 Irritable bowel syndrome without diarrhea: Secondary | ICD-10-CM | POA: Diagnosis not present

## 2016-04-12 DIAGNOSIS — I129 Hypertensive chronic kidney disease with stage 1 through stage 4 chronic kidney disease, or unspecified chronic kidney disease: Secondary | ICD-10-CM | POA: Diagnosis present

## 2016-04-12 DIAGNOSIS — Z79899 Other long term (current) drug therapy: Secondary | ICD-10-CM

## 2016-04-12 DIAGNOSIS — Z8673 Personal history of transient ischemic attack (TIA), and cerebral infarction without residual deficits: Secondary | ICD-10-CM

## 2016-04-12 DIAGNOSIS — E785 Hyperlipidemia, unspecified: Secondary | ICD-10-CM | POA: Diagnosis present

## 2016-04-12 DIAGNOSIS — G2581 Restless legs syndrome: Secondary | ICD-10-CM | POA: Diagnosis present

## 2016-04-12 DIAGNOSIS — R1032 Left lower quadrant pain: Secondary | ICD-10-CM

## 2016-04-12 DIAGNOSIS — D649 Anemia, unspecified: Secondary | ICD-10-CM | POA: Diagnosis present

## 2016-04-12 DIAGNOSIS — N179 Acute kidney failure, unspecified: Secondary | ICD-10-CM | POA: Diagnosis present

## 2016-04-12 DIAGNOSIS — R110 Nausea: Secondary | ICD-10-CM | POA: Diagnosis not present

## 2016-04-12 DIAGNOSIS — R109 Unspecified abdominal pain: Secondary | ICD-10-CM

## 2016-04-12 DIAGNOSIS — N189 Chronic kidney disease, unspecified: Secondary | ICD-10-CM | POA: Diagnosis present

## 2016-04-12 DIAGNOSIS — K5790 Diverticulosis of intestine, part unspecified, without perforation or abscess without bleeding: Secondary | ICD-10-CM | POA: Diagnosis present

## 2016-04-12 DIAGNOSIS — Z803 Family history of malignant neoplasm of breast: Secondary | ICD-10-CM

## 2016-04-12 DIAGNOSIS — Z86718 Personal history of other venous thrombosis and embolism: Secondary | ICD-10-CM

## 2016-04-12 DIAGNOSIS — Z853 Personal history of malignant neoplasm of breast: Secondary | ICD-10-CM

## 2016-04-12 DIAGNOSIS — Z7902 Long term (current) use of antithrombotics/antiplatelets: Secondary | ICD-10-CM

## 2016-04-12 DIAGNOSIS — Z794 Long term (current) use of insulin: Secondary | ICD-10-CM

## 2016-04-12 DIAGNOSIS — K449 Diaphragmatic hernia without obstruction or gangrene: Secondary | ICD-10-CM | POA: Diagnosis present

## 2016-04-12 DIAGNOSIS — K640 First degree hemorrhoids: Secondary | ICD-10-CM | POA: Diagnosis present

## 2016-04-12 DIAGNOSIS — E1122 Type 2 diabetes mellitus with diabetic chronic kidney disease: Secondary | ICD-10-CM | POA: Diagnosis present

## 2016-04-12 DIAGNOSIS — K2970 Gastritis, unspecified, without bleeding: Secondary | ICD-10-CM | POA: Diagnosis present

## 2016-04-12 DIAGNOSIS — K8020 Calculus of gallbladder without cholecystitis without obstruction: Secondary | ICD-10-CM

## 2016-04-12 LAB — CBC
HCT: 32.7 % — ABNORMAL LOW (ref 35.0–47.0)
HEMOGLOBIN: 11.4 g/dL — AB (ref 12.0–16.0)
MCH: 30.3 pg (ref 26.0–34.0)
MCHC: 34.9 g/dL (ref 32.0–36.0)
MCV: 86.9 fL (ref 80.0–100.0)
PLATELETS: 243 10*3/uL (ref 150–440)
RBC: 3.77 MIL/uL — AB (ref 3.80–5.20)
RDW: 15.2 % — ABNORMAL HIGH (ref 11.5–14.5)
WBC: 6.4 10*3/uL (ref 3.6–11.0)

## 2016-04-12 LAB — COMPREHENSIVE METABOLIC PANEL
ALT: 13 U/L — AB (ref 14–54)
ANION GAP: 8 (ref 5–15)
AST: 21 U/L (ref 15–41)
Albumin: 3.8 g/dL (ref 3.5–5.0)
Alkaline Phosphatase: 61 U/L (ref 38–126)
BUN: 16 mg/dL (ref 6–20)
CALCIUM: 8.9 mg/dL (ref 8.9–10.3)
CHLORIDE: 102 mmol/L (ref 101–111)
CO2: 26 mmol/L (ref 22–32)
CREATININE: 1.54 mg/dL — AB (ref 0.44–1.00)
GFR, EST AFRICAN AMERICAN: 35 mL/min — AB (ref >60)
GFR, EST NON AFRICAN AMERICAN: 30 mL/min — AB (ref >60)
Glucose, Bld: 119 mg/dL — ABNORMAL HIGH (ref 65–99)
Potassium: 4.8 mmol/L (ref 3.5–5.1)
Sodium: 136 mmol/L (ref 135–145)
Total Bilirubin: 0.4 mg/dL (ref 0.3–1.2)
Total Protein: 7.6 g/dL (ref 6.5–8.1)

## 2016-04-12 LAB — LIPASE, BLOOD: LIPASE: 14 U/L (ref 11–51)

## 2016-04-12 LAB — URINALYSIS COMPLETE WITH MICROSCOPIC (ARMC ONLY)
Bacteria, UA: NONE SEEN
Bilirubin Urine: NEGATIVE
Glucose, UA: NEGATIVE mg/dL
Hgb urine dipstick: NEGATIVE
Nitrite: NEGATIVE
PH: 6 (ref 5.0–8.0)
PROTEIN: 100 mg/dL — AB
SPECIFIC GRAVITY, URINE: 1.014 (ref 1.005–1.030)

## 2016-04-12 LAB — TROPONIN I: Troponin I: <0.03 ng/mL (ref <0.03)

## 2016-04-12 MED ORDER — ONDANSETRON HCL 4 MG/2ML IJ SOLN
4.0000 mg | Freq: Once | INTRAMUSCULAR | Status: AC
  Administered 2016-04-12: 4 mg via INTRAVENOUS
  Filled 2016-04-12: qty 2

## 2016-04-12 MED ORDER — SODIUM CHLORIDE 0.9 % IV BOLUS (SEPSIS)
500.0000 mL | Freq: Once | INTRAVENOUS | Status: AC
  Administered 2016-04-12: 500 mL via INTRAVENOUS

## 2016-04-12 MED ORDER — TRAMADOL HCL 50 MG PO TABS
50.0000 mg | ORAL_TABLET | ORAL | Status: AC
  Administered 2016-04-12: 50 mg via ORAL
  Filled 2016-04-12: qty 1

## 2016-04-12 MED ORDER — GABAPENTIN 600 MG PO TABS
600.0000 mg | ORAL_TABLET | Freq: Once | ORAL | Status: AC
  Administered 2016-04-12: 600 mg via ORAL
  Filled 2016-04-12: qty 1

## 2016-04-12 NOTE — ED Notes (Signed)
Admitting MD at bedside.

## 2016-04-12 NOTE — ED Triage Notes (Signed)
Pt presents to ED c/o gallbladder pain. Pt was seen Thursday for gallstones and was given prescription and told to come back if symptoms did not resolve. Pt still with nausea and diarrhea since Thursday.

## 2016-04-12 NOTE — Consult Note (Signed)
Surgical Consultation  04/12/2016  Kristen Barron is an 82 y.o. female.   CC epigastric pain  HPI: This patient who I saw on August 31 only a few days ago where I was asked see the patient for gallstones identified on CT scan. When I met the patient in the emergency room her chief complaint was of left lower quadrant pain.  Today I was asked see the patient again for gallstones seen on ultrasound without signs of inflammatory process. When asked for the patient's pain and she points to the epigastrium. She's had some nausea and vomited earlier this morning and her daughter states that she has no energy is not eating much and cannot get around the house anymore.  She denies fevers or chills she's had nausea but only one emesis this morning she does not associated with fatty foods. She points the epigastrium only but states that it's hurting all over her abdomen and she has had diarrhea for 7-10 days without melena or hematochezia.  The patient was admitted to the hospital after my visit with her on the 31st but it does not appear that any additional testing or consultation with GI etc. were identified. Patient's daughter is quite frustrated with care and lack of workup. This was discussed with Dr. Jacqualine Code in the ED.  Past Medical History:  Diagnosis Date  . Arthritis   . Breast cancer ([place]) 03/06/2014   right breast with mastectomy and chemo  . Cancer [facility])    Reports lumpectomy for a cyst  . CHF (congestive heart failure) ([place])   . Collagen vascular disease ([place])   . Diabetes ([place])   . DVT (deep venous thrombosis) ([place])   . Hemorrhoids   . Hypertension   . Renal disorder   . Renal insufficiency   . Stroke [facility])    August 2014, but has had strokes prior as well    Past Surgical History:  Procedure Laterality Date  . ABDOMINAL HYSTERECTOMY     Patient not clear as to why  . BACK SURGERY    . BREAST BIOPSY Right February 15 2014   invasive mammary carcinoma/ER/PR negative, HER 2 positive   . BREAST BIOPSY Right 1999   negative biopsy  . BREAST SURGERY Right 03/06/14   mastectomy  . HAND SURGERY Right    Carpel tunnel release in the 1970s  . LUMBAR LAMINECTOMY/DECOMPRESSION MICRODISCECTOMY Bilateral 07/24/2013   Procedure: LUMBAR LAMINECTOMY/DECOMPRESSION MICRODISCECTOMY LUMBAR THREE-FOUR;  Surgeon: Charlie Pitter, MD;  Location: [place] [facility];  Service: Neurosurgery;  Laterality: Bilateral;  . MASTECTOMY Right 03/06/2014   right br ca    Family History  Problem Relation Age of Onset  . Cancer Sister 23    breast  . Breast cancer Sister 33  . Cancer Brother     kidney  . Cancer Maternal Aunt     breast  . Breast cancer Maternal Aunt 70  . Cancer Other     maternal niece with breast cancer  . Breast cancer Other   . Cancer Sister     pancreatic    Social History:  reports that she has never smoked. She has never used smokeless tobacco. She reports that she does not drink alcohol or use drugs.  Allergies: No Known Allergies  Medications reviewed.   Review of Systems:   Review of Systems  Constitutional: Negative for chills and fever.  HENT: Negative.   Eyes: Negative.   Respiratory: Negative.   Cardiovascular: Negative.   Gastrointestinal: Positive for  abdominal pain, diarrhea, nausea and vomiting. Negative for blood in stool, constipation, heartburn and melena.  Genitourinary: Negative.   Musculoskeletal: Negative.   Skin: Negative.   Neurological: Negative.   Endo/Heme/Allergies: Negative.   Psychiatric/Behavioral: Negative.      Physical Exam:  BP (!) 175/59 (BP Location: Right Arm)   Pulse 80   Temp 98.1 F (36.7 C) (Oral)   Resp 18   Ht 5' 2"  (1.575 m)   Wt 198 lb (89.8 kg)   SpO2 97%   BMI 36.21 kg/m   Physical Exam  Constitutional: She is oriented to person, place, and time and well-developed, well-nourished, and in no distress. No distress.  Obese  HENT:  Head: Normocephalic and atraumatic.  Eyes: Right eye exhibits no  discharge. Left eye exhibits no discharge. No scleral icterus.  Neck: Normal range of motion.  Cardiovascular: Normal rate and regular rhythm.   Pulmonary/Chest: Effort normal. No respiratory distress.  Abdominal: Soft. She exhibits no distension. There is tenderness. There is no rebound and no guarding.  Diffuse abdominal tenderness maximal in the left lower quadrant minimal in the right upper quadrant negative Murphy sign of umbilical hernia which is soft and reducible with no overlying skin changes or tenderness  Note that this exam is unchanged from the exam of 831 where left lower quadrant tenderness was the prominent feature on my exam and this is essentially unchanged today as well  Musculoskeletal: Normal range of motion. She exhibits edema.  Lymphadenopathy:    She has no cervical adenopathy.  Neurological: She is alert and oriented to person, place, and time.  Skin: Skin is warm and dry. No rash noted. She is not diaphoretic. No erythema.  Vitals reviewed.     Results for orders placed or performed during the hospital encounter of 04/12/16 (from the past 48 hour(s))  Lipase, blood     Status: None   Collection Time: 04/12/16  3:53 PM  Result Value Ref Range   Lipase 14 11 - 51 U/L  Comprehensive metabolic panel     Status: Abnormal   Collection Time: 04/12/16  3:53 PM  Result Value Ref Range   Sodium 136 135 - 145 mmol/L   Potassium 4.8 3.5 - 5.1 mmol/L   Chloride 102 101 - 111 mmol/L   CO2 26 22 - 32 mmol/L   Glucose, Bld 119 (H) 65 - 99 mg/dL   BUN 16 6 - 20 mg/dL   Creatinine, Ser 1.54 (H) 0.44 - 1.00 mg/dL   Calcium 8.9 8.9 - 10.3 mg/dL   Total Protein 7.6 6.5 - 8.1 g/dL   Albumin 3.8 3.5 - 5.0 g/dL   AST 21 15 - 41 U/L   ALT 13 (L) 14 - 54 U/L   Alkaline Phosphatase 61 38 - 126 U/L   Total Bilirubin 0.4 0.3 - 1.2 mg/dL   GFR calc non Af Amer 30 (L) >60 mL/min   GFR calc Af Amer 35 (L) >60 mL/min    Comment: (NOTE) The eGFR has been calculated using the CKD  EPI equation. This calculation has not been validated in all clinical situations. eGFR's persistently <60 mL/min signify possible Chronic Kidney Disease.    Anion gap 8 5 - 15  CBC     Status: Abnormal   Collection Time: 04/12/16  3:53 PM  Result Value Ref Range   WBC 6.4 3.6 - 11.0 K/uL   RBC 3.77 (L) 3.80 - 5.20 MIL/uL   Hemoglobin 11.4 (L) 12.0 - 16.0 g/dL  HCT 32.7 (L) 35.0 - 47.0 %   MCV 86.9 80.0 - 100.0 fL   MCH 30.3 26.0 - 34.0 pg   MCHC 34.9 32.0 - 36.0 g/dL   RDW 15.2 (H) 11.5 - 14.5 %   Platelets 243 150 - 440 K/uL  Troponin I     Status: None   Collection Time: 04/12/16  3:53 PM  Result Value Ref Range   Troponin I <0.03 <0.03 ng/mL   US Abdomen Limited Ruq  Result Date: 04/12/2016 CLINICAL DATA:  82 year old female with abdominal pain. EXAM: US ABDOMEN LIMITED - RIGHT UPPER QUADRANT COMPARISON:  CT dated 04/09/2016 FINDINGS: Gallbladder: There multiple small stones within the gallbladder. The gallbladder is mildly distended. There is no gallbladder wall thickening or pericholecystic fluid. Positive sonographic Murphy's sign. Common bile duct: Diameter: 3 mm Liver: No focal lesion identified. Within normal limits in parenchymal echogenicity. IMPRESSION: Cholelithiasis without definite sonographic evidence of acute cholecystitis. A hepatobiliary scintigraphy may provide better evaluation of the gallbladder if an acute cholecystitis is clinically suspected. Electronically Signed   By: Anner Crete M.D.   On: 04/12/2016 20:29    Assessment/Plan:  This a patient with gallstones. On 2 separate occasions in the last several days I've been asked see the patient for gallstones but on questioning the patient's subjective nature her pain is not necessarily in the right upper quadrant. Furthermore on exam her pain on 2 separate exams of been consistently in the left lower quadrant as the area of maximal tenderness both times with negative Murphy signs.  Also of note the patient  has had multiple white blood cell counts which have remained normal with normal liver function tests as well and she has had 2 separate studies in the last few days, an ultrasound and a CT scan neither of which identified any sign of inflammatory process around the gallbladder.  With the patient's findings of diarrhea and lack of findings in the right upper quadrant other than gallstones without inflammation on studies it is my belief that while this could be gallbladder disease it is not a prominent issue in this patient's current life problems. As mentioned previously and 2 physicians at her previous admission I feel that this is more likely to be something other than gallbladder disease but that she requires additional workup either in the form of a HIDA scan and/or GI consultation for gastritis versus did the diarrheal problem that she is experiencing as well.  One other thing is that she is on Plavix and would not be a surgical candidate in the immediate future that this can be altered should the gallbladder be more prominent in her symptomatology. This was discussed again with the emergency room physician and with the patient's family (no family was present when I met her on the 31st but are present today).  Florene Glen, MD, FACS

## 2016-04-12 NOTE — Care Management Obs Status (Signed)
[facility] NOTIFICATION   Patient Details  Name: Kristen Barron MRN: [id number] Date of Birth: [birth year]   Medicare Observation Status Notification Given:  Yes    CrutchfieldAntony Haste, RN 04/12/2016, 11:03 PM

## 2016-04-12 NOTE — ED Notes (Signed)
Pt c/o of rest lest legs states she normally takes Neurontin TID. Jacqualine Code, MD informed.

## 2016-04-12 NOTE — ED Provider Notes (Signed)
[facility] Emergency Department Provider Note  ____________________________________________   First MD Initiated Contact with Patient 04/12/16 1739     (approximate)  I have reviewed the triage vital signs and the nursing notes.   HISTORY  Chief Complaint Abdominal Pain (pt was last seen here for gallstones thursday)    HPI KATERIN REPINSKI is a 82 y.o. female here for evaluation of upper abdominal pain. Reports that since leaving the hospital, she did having discomfort in the upper abdomen which she describes as mild and constant. Not associated with any diarrhea or constipation. She reports it is similar to the pain that led to her being hospitalized, but less severe.  No fevers or chills. No chest pain or trouble breathing.   Past Medical History:  Diagnosis Date  . Arthritis   . Breast cancer ([place]) 03/06/2014   right breast with mastectomy and chemo  . Cancer [facility])    Reports lumpectomy for a cyst  . CHF (congestive heart failure) ([place])   . Collagen vascular disease ([place])   . Diabetes ([place])   . DVT (deep venous thrombosis) ([place])   . Hemorrhoids   . Hypertension   . Renal disorder   . Renal insufficiency   . Stroke [facility])    August 2014, but has had strokes prior as well    Patient Active Problem List   Diagnosis Date Noted  . Abdominal pain 04/09/2016  . Right lower quadrant pain   . Vomiting   . Gallstone   . Osteoarthritis of knee (Bilateral) (L>R) 12/26/2015  . Chronic pain 12/11/2015  . Long term current use of opiate analgesic 12/11/2015  . Encounter for therapeutic drug level monitoring 12/11/2015  . Chronic knee pain (Location of Primary Source of Pain) (Bilateral) (L>R) 12/11/2015  . Long term prescription opiate use 12/11/2015  . Opiate use (20 MME/Day) 12/11/2015  . Chronic hand pain (Location of Secondary source of pain) (Bilateral) (L>R) 12/11/2015  . Failed back surgical syndrome x 2 (Surgeon: Dr. Deri Fuelling) 12/11/2015    . Lumbar spondylosis 12/11/2015  . Chronic low back pain (Location of Tertiary source of pain) (Bilateral) (L>R) 12/11/2015  . Lumbar facet syndrome 12/11/2015  . Chronic lower extremity pain (Bilateral) (L>R) 12/11/2015  . Chronic neck pain (Left) 12/11/2015  . Cervical spondylosis 12/11/2015  . Chronic shoulder pain (Right) 12/11/2015  . Insulin dependent diabetes mellitus ([place]) 12/11/2015  . Abnormal MRI, lumbar spine (07/19/2013) 12/11/2015  . Lumbar postlaminectomy syndrome (L4-5) 12/11/2015  . Fusion of lumbar spine (L4-5 Ray cages) 12/11/2015  . Lumbar Levoscoliosis with apex at L4 12/11/2015  . Grade 1 Retrolisthesis of L2 over L3 and L3 over L4 12/11/2015  . Lumbar facet arthropathy 12/11/2015  . Lumbar foraminal stenosis (Bilateral L2-3, L3-4 & Left L5-S1) 12/11/2015  . Chronic anticoagulation (Plavix) 12/11/2015  . Breast cancer of lower-outer quadrant of right female breast ([place]) 02/28/2014  . Lumbar stenosis with neurogenic claudication 07/24/2013  . Lumbar central spinal stenosis ( Severe at L3-4) (Mild R>L L2-3 & L5-S1) 07/20/2013  . Hyponatremia 07/20/2013  . Hypertension 07/20/2013  . Normocytic anemia 07/20/2013  . CKD (chronic kidney disease) stage 3, GFR 30-59 ml/min 07/20/2013  .  Lumbar DDD (degenerative disc disease) 07/20/2013    Past Surgical History:  Procedure Laterality Date  . ABDOMINAL HYSTERECTOMY     Patient not clear as to why  . BACK SURGERY    . BREAST BIOPSY Right February 15 2014   invasive mammary carcinoma/ER/PR negative,  HER 2 positive  . BREAST BIOPSY Right 1999   negative biopsy  . BREAST SURGERY Right 03/06/14   mastectomy  . HAND SURGERY Right    Carpel tunnel release in the 1970s  . LUMBAR LAMINECTOMY/DECOMPRESSION MICRODISCECTOMY Bilateral 07/24/2013   Procedure: LUMBAR LAMINECTOMY/DECOMPRESSION MICRODISCECTOMY LUMBAR THREE-FOUR;  Surgeon: Charlie Pitter, MD;  Location: [place] [facility];  Service: Neurosurgery;  Laterality: Bilateral;  .  MASTECTOMY Right 03/06/2014   right br ca    Prior to Admission medications   Medication Sig Start Date End Date Taking? Authorizing Provider  acetaminophen (TYLENOL) 500 MG tablet Take 500 mg by mouth every 6 (six) hours as needed.   Yes Historical Provider, MD  clopidogrel (PLAVIX) 75 MG tablet Take 75 mg by mouth daily with breakfast.   Yes Historical Provider, MD  gabapentin (NEURONTIN) 300 MG capsule Take 600 mg by mouth 3 (three) times daily.  12/04/13  Yes Historical Provider, MD  Glucosamine-Chondroit-Vit C-Mn (GLUCOSAMINE 1500 COMPLEX PO) Take by mouth 3 (three) times daily.   Yes Historical Provider, MD  hydrALAZINE (APRESOLINE) 25 MG tablet Take 25 mg by mouth 3 (three) times daily.   Yes Historical Provider, MD  HYDROcodone-acetaminophen (NORCO/VICODIN) 5-325 MG per tablet Take 1 tablet by mouth every 6 (six) hours as needed.  02/16/14  Yes Historical Provider, MD  insulin aspart (NOVOLOG) 100 UNIT/ML injection Inject 8 Units into the skin 3 (three) times daily before meals.   Yes Historical Provider, MD  insulin detemir (LEVEMIR) 100 UNIT/ML injection Inject 18 Units into the skin at bedtime.   Yes Historical Provider, MD  magnesium oxide (MAG-OX) 400 MG tablet Take 400 mg by mouth 2 (two) times daily.   Yes Historical Provider, MD  Melatonin 5 MG TABS Take 5 mg by mouth at bedtime.   Yes Historical Provider, MD  metoprolol succinate (TOPROL-XL) 100 MG 24 hr tablet Take 100 mg by mouth daily. Take with or immediately following a meal.   Yes Historical Provider, MD  Omega 3 1200 MG CAPS Take by mouth 2 (two) times daily.   Yes Historical Provider, MD  ondansetron (ZOFRAN) 4 MG tablet Take 4 mg by mouth every 8 (eight) hours as needed for nausea or vomiting.   Yes Historical Provider, MD  pantoprazole (PROTONIX) 40 MG tablet Take 1 tablet (40 mg total) by mouth 2 (two) times daily before a meal. 04/09/16  Yes Srikar Sudini, MD  pravastatin (PRAVACHOL) 80 MG tablet Take 80 mg by mouth every  evening.    Yes Historical Provider, MD  torsemide (DEMADEX) 20 MG tablet Take 20 mg by mouth 2 (two) times daily.   Yes Historical Provider, MD  traMADol (ULTRAM) 50 MG tablet Take 1 tablet (50 mg total) by mouth every 12 (twelve) hours as needed for severe pain. 04/09/16  Yes Srikar Sudini, MD    Allergies Review of patient's allergies indicates no known allergies.  Family History  Problem Relation Age of Onset  . Cancer Sister 44    breast  . Breast cancer Sister 32  . Cancer Brother     kidney  . Cancer Maternal Aunt     breast  . Breast cancer Maternal Aunt 70  . Cancer Other     maternal niece with breast cancer  . Breast cancer Other   . Cancer Sister     pancreatic    Social History Social History  Substance Use Topics  . Smoking status: Never Smoker  . Smokeless tobacco: Never Used  .  Alcohol use No    Review of Systems Constitutional: No fever/chills Eyes: No visual changes. ENT: No sore throat. Cardiovascular: Denies chest pain. Respiratory: Denies shortness of breath. Gastrointestinal: No vomiting.  No diarrhea.  No constipation. Genitourinary: Negative for dysuria. Musculoskeletal: Negative for back pain. Skin: Negative for rash. Neurological: Negative for headaches, focal weakness or numbness.  10-point ROS otherwise negative.  ____________________________________________   PHYSICAL EXAM:  VITAL SIGNS: ED Triage Vitals  Enc Vitals Group     BP 04/12/16 1544 (!) 177/55     Pulse Rate 04/12/16 1544 76     Resp 04/12/16 1544 18     Temp 04/12/16 1544 98.1 F (36.7 C)     Temp Source 04/12/16 1544 Oral     SpO2 04/12/16 1544 96 %     Weight 04/12/16 1545 198 lb (89.8 kg)     Height 04/12/16 1545 5\' 2"  (1.575 m)     Head Circumference --      Peak Flow --      Pain Score 04/12/16 1545 0     Pain Loc --      Pain Edu? --      Excl. in [place]? --     Constitutional: Alert and oriented. Well appearing and in no acute distress. Eyes:  Conjunctivae are normal. PERRL. EOMI. Head: Atraumatic. Nose: No congestion/rhinnorhea. Mouth/Throat: Mucous membranes are moist.  Oropharynx non-erythematous. Neck: No stridor.   Cardiovascular: Normal rate, regular rhythm. Grossly normal heart sounds.  Good peripheral circulation. Respiratory: Normal respiratory effort.  No retractions. Lungs CTAB. Gastrointestinal: Soft and nontender except for mild to moderate discomfort in the epigastrium. No distention. No abdominal bruits. No CVA tenderness. Some tenderness to [facility], but seems slightly more tender across the epigastrium. No rebound guarding or evidence of peritonitis. Musculoskeletal: No lower extremity tenderness nor edema.  No joint effusions. Neurologic:  Normal speech and language. No gross focal neurologic deficits are appreciated.  Skin:  Skin is warm, dry and intact. No rash noted. Psychiatric: Mood and affect are normal. Speech and behavior are normal.  ____________________________________________   LABS (all labs ordered are listed, but only abnormal results are displayed)  Labs Reviewed  COMPREHENSIVE METABOLIC PANEL - Abnormal; Notable for the following:       Result Value   Glucose, Bld 119 (*)    Creatinine, Ser 1.54 (*)    ALT 13 (*)    GFR calc non Af Amer 30 (*)    GFR calc Af Amer 35 (*)    All other components within normal limits  CBC - Abnormal; Notable for the following:    RBC 3.77 (*)    Hemoglobin 11.4 (*)    HCT 32.7 (*)    RDW 15.2 (*)    All other components within normal limits  URINALYSIS COMPLETEWITH MICROSCOPIC ([facility] ONLY) - Abnormal; Notable for the following:    Color, Urine YELLOW (*)    APPearance CLEAR (*)    Ketones, ur TRACE (*)    Protein, ur 100 (*)    Leukocytes, UA TRACE (*)    Squamous Epithelial / LPF 0-5 (*)    All other components within normal limits  LIPASE, BLOOD  TROPONIN I   ____________________________________________  EKG  Reviewed and are me at 1835 Normal  sinus rhythm, minimal baseline artifact, QRS 100, QTc 470, PR 120, reviewed and interpreted as normal sinus rhythm, occasional PAC  Lead 1, lead aVL now converted from previous.... Question lead reversal. We'll have nurse repeat  -----------------------------------------  7:06 PM on 04/12/2016 -----------------------------------------  Nurse repeat EKG and found that to cables were reversed. Second EKG reviewed and interpreted by me at 1850 ED ECG REPORT I, QUALE, MARK, the attending physician, personally viewed and interpreted this ECG.  Date: 04/12/2016 EKG Time: 1850 Rate: 75 Rhythm: normal sinus rhythm QRS Axis: normal Intervals: normal ST/T Wave abnormalities: normal Conduction Disturbances: none Narrative Interpretation: unremarkable  ____________________________________________  RADIOLOGY  US Abdomen Limited Ruq  Result Date: 04/12/2016 CLINICAL DATA:  82 year old female with abdominal pain. EXAM: US ABDOMEN LIMITED - RIGHT UPPER QUADRANT COMPARISON:  CT dated 04/09/2016 FINDINGS: Gallbladder: There multiple small stones within the gallbladder. The gallbladder is mildly distended. There is no gallbladder wall thickening or pericholecystic fluid. Positive sonographic Murphy's sign. Common bile duct: Diameter: 3 mm Liver: No focal lesion identified. Within normal limits in parenchymal echogenicity. IMPRESSION: Cholelithiasis without definite sonographic evidence of acute cholecystitis. A hepatobiliary scintigraphy may provide better evaluation of the gallbladder if an acute cholecystitis is clinically suspected. Electronically Signed   By: Anner Crete M.D.   On: 04/12/2016 20:29    ____________________________________________   PROCEDURES  Procedure(s) performed: None  Procedures  Critical Care performed: No  ____________________________________________   INITIAL IMPRESSION / ASSESSMENT AND PLAN / ED COURSE  Pertinent labs & imaging results that were available  during my care of the patient were reviewed by me and considered in my medical decision making (see chart for details).  Patient presents for evaluation of recurrent upper abdominal pain. Recently admitted and thought to have gastroenteritis. She had a surgical consult for questionable cholecystitis both felt not to have it. CT was reassuring, but based on her previous ultrasound and the location of epigastric pain which is recurred and steadily been present for 24 hours L repeat right upper quadrant ultrasound, and based on that may or may not end up needing a repeat surgical consult.  I discussed with the patient repeat CT imaging to further evaluate for other causes such as "bowel obstruction" appendicitis", or other infection or issues such as a perforation. Based on her recent CT she and family and I after shared medical decision making are not planning on repeating CT, but we will repeat right upper quadrant ultrasound.   Overall nontoxic, well-appearing, no evidence of acute surgical abdomen clinical examination.  Clinical Course   Appreciate surgical consult recommendations. Discussed with the patient and her family, due to mild ongoing nausea, poor appetite and apparent worsening or GFR we will bring her in for observation with a plan to obtain GI consultation and a HIDA scan  ____________________________________________   FINAL CLINICAL IMPRESSION(S) / ED DIAGNOSES  Final diagnoses:  Abdominal pain  Acute on chronic renal insufficiency    NEW MEDICATIONS STARTED DURING THIS VISIT:  New Prescriptions   No medications on file     Note:  This document was prepared using Dragon voice recognition software and may include unintentional dictation errors.     Delman Kitten, MD 04/12/16 [phone]

## 2016-04-13 DIAGNOSIS — Z853 Personal history of malignant neoplasm of breast: Secondary | ICD-10-CM | POA: Diagnosis not present

## 2016-04-13 DIAGNOSIS — N179 Acute kidney failure, unspecified: Secondary | ICD-10-CM | POA: Diagnosis present

## 2016-04-13 DIAGNOSIS — K8020 Calculus of gallbladder without cholecystitis without obstruction: Secondary | ICD-10-CM | POA: Diagnosis present

## 2016-04-13 DIAGNOSIS — E1122 Type 2 diabetes mellitus with diabetic chronic kidney disease: Secondary | ICD-10-CM | POA: Diagnosis present

## 2016-04-13 DIAGNOSIS — K2970 Gastritis, unspecified, without bleeding: Secondary | ICD-10-CM | POA: Diagnosis present

## 2016-04-13 DIAGNOSIS — Z86718 Personal history of other venous thrombosis and embolism: Secondary | ICD-10-CM | POA: Diagnosis not present

## 2016-04-13 DIAGNOSIS — K449 Diaphragmatic hernia without obstruction or gangrene: Secondary | ICD-10-CM | POA: Diagnosis present

## 2016-04-13 DIAGNOSIS — K5790 Diverticulosis of intestine, part unspecified, without perforation or abscess without bleeding: Secondary | ICD-10-CM | POA: Diagnosis present

## 2016-04-13 DIAGNOSIS — Z7902 Long term (current) use of antithrombotics/antiplatelets: Secondary | ICD-10-CM | POA: Diagnosis not present

## 2016-04-13 DIAGNOSIS — Z8673 Personal history of transient ischemic attack (TIA), and cerebral infarction without residual deficits: Secondary | ICD-10-CM | POA: Diagnosis not present

## 2016-04-13 DIAGNOSIS — K589 Irritable bowel syndrome without diarrhea: Secondary | ICD-10-CM | POA: Diagnosis present

## 2016-04-13 DIAGNOSIS — G2581 Restless legs syndrome: Secondary | ICD-10-CM | POA: Diagnosis present

## 2016-04-13 DIAGNOSIS — N189 Chronic kidney disease, unspecified: Secondary | ICD-10-CM | POA: Diagnosis present

## 2016-04-13 DIAGNOSIS — Z794 Long term (current) use of insulin: Secondary | ICD-10-CM | POA: Diagnosis not present

## 2016-04-13 DIAGNOSIS — Z803 Family history of malignant neoplasm of breast: Secondary | ICD-10-CM | POA: Diagnosis not present

## 2016-04-13 DIAGNOSIS — K640 First degree hemorrhoids: Secondary | ICD-10-CM | POA: Diagnosis present

## 2016-04-13 DIAGNOSIS — R110 Nausea: Secondary | ICD-10-CM | POA: Diagnosis present

## 2016-04-13 DIAGNOSIS — E785 Hyperlipidemia, unspecified: Secondary | ICD-10-CM | POA: Diagnosis present

## 2016-04-13 DIAGNOSIS — I129 Hypertensive chronic kidney disease with stage 1 through stage 4 chronic kidney disease, or unspecified chronic kidney disease: Secondary | ICD-10-CM | POA: Diagnosis present

## 2016-04-13 DIAGNOSIS — Z79899 Other long term (current) drug therapy: Secondary | ICD-10-CM | POA: Diagnosis not present

## 2016-04-13 DIAGNOSIS — D649 Anemia, unspecified: Secondary | ICD-10-CM | POA: Diagnosis present

## 2016-04-13 LAB — COMPREHENSIVE METABOLIC PANEL
ALT: 11 U/L — ABNORMAL LOW (ref 14–54)
ANION GAP: 7 (ref 5–15)
AST: 18 U/L (ref 15–41)
Albumin: 3.1 g/dL — ABNORMAL LOW (ref 3.5–5.0)
Alkaline Phosphatase: 51 U/L (ref 38–126)
BILIRUBIN TOTAL: 0.7 mg/dL (ref 0.3–1.2)
BUN: 13 mg/dL (ref 6–20)
CO2: 25 mmol/L (ref 22–32)
Calcium: 8.2 mg/dL — ABNORMAL LOW (ref 8.9–10.3)
Chloride: 106 mmol/L (ref 101–111)
Creatinine, Ser: 1.26 mg/dL — ABNORMAL HIGH (ref 0.44–1.00)
GFR, EST AFRICAN AMERICAN: 44 mL/min — AB (ref >60)
GFR, EST NON AFRICAN AMERICAN: 38 mL/min — AB (ref >60)
Glucose, Bld: 101 mg/dL — ABNORMAL HIGH (ref 65–99)
POTASSIUM: 4 mmol/L (ref 3.5–5.1)
Sodium: 138 mmol/L (ref 135–145)
TOTAL PROTEIN: 6.2 g/dL — AB (ref 6.5–8.1)

## 2016-04-13 LAB — CBC
HEMATOCRIT: 28.9 % — AB (ref 35.0–47.0)
Hemoglobin: 9.8 g/dL — ABNORMAL LOW (ref 12.0–16.0)
MCH: 29.2 pg (ref 26.0–34.0)
MCHC: 33.9 g/dL (ref 32.0–36.0)
MCV: 86.3 fL (ref 80.0–100.0)
PLATELETS: 213 10*3/uL (ref 150–440)
RBC: 3.35 MIL/uL — ABNORMAL LOW (ref 3.80–5.20)
RDW: 15.6 % — AB (ref 11.5–14.5)
WBC: 5.8 10*3/uL (ref 3.6–11.0)

## 2016-04-13 LAB — MAGNESIUM: Magnesium: 2.4 mg/dL (ref 1.7–2.4)

## 2016-04-13 LAB — GLUCOSE, CAPILLARY
GLUCOSE-CAPILLARY: 104 mg/dL — AB (ref 65–99)
GLUCOSE-CAPILLARY: 119 mg/dL — AB (ref 65–99)

## 2016-04-13 LAB — PHOSPHORUS: Phosphorus: 4 mg/dL (ref 2.5–4.6)

## 2016-04-13 MED ORDER — TRAMADOL HCL 50 MG PO TABS: 50.0000 mg | ORAL_TABLET | Freq: Two times a day (BID) | ORAL | Status: DC | PRN

## 2016-04-13 MED ORDER — ACETAMINOPHEN 325 MG PO TABS
650.0000 mg | ORAL_TABLET | Freq: Four times a day (QID) | ORAL | Status: DC | PRN
  Administered 2016-04-14 – 2016-04-15 (×3): 650 mg via ORAL
  Filled 2016-04-13 (×3): qty 2

## 2016-04-13 MED ORDER — INSULIN DETEMIR 100 UNIT/ML ~~LOC~~ SOLN
18.0000 [IU] | Freq: Every day | SUBCUTANEOUS | Status: DC
  Administered 2016-04-13 – 2016-04-15 (×4): 18 [IU] via SUBCUTANEOUS
  Filled 2016-04-13 (×5): qty 0.18

## 2016-04-13 MED ORDER — INSULIN ASPART 100 UNIT/ML ~~LOC~~ SOLN
0.0000 [IU] | SUBCUTANEOUS | Status: DC
  Administered 2016-04-13 – 2016-04-14 (×4): 3 [IU] via SUBCUTANEOUS
  Administered 2016-04-14: 5 [IU] via SUBCUTANEOUS
  Administered 2016-04-15: 3 [IU] via SUBCUTANEOUS
  Administered 2016-04-15: 5 [IU] via SUBCUTANEOUS
  Filled 2016-04-13 (×5): qty 3
  Filled 2016-04-13 (×2): qty 5

## 2016-04-13 MED ORDER — METOCLOPRAMIDE HCL 5 MG/ML IJ SOLN
5.0000 mg | Freq: Three times a day (TID) | INTRAMUSCULAR | Status: DC
  Administered 2016-04-13 – 2016-04-15 (×8): 5 mg via INTRAVENOUS
  Filled 2016-04-13 (×9): qty 2

## 2016-04-13 MED ORDER — ONDANSETRON HCL 4 MG/2ML IJ SOLN: 4.0000 mg | Freq: Four times a day (QID) | INTRAMUSCULAR | Status: DC | PRN

## 2016-04-13 MED ORDER — PRAVASTATIN SODIUM 40 MG PO TABS
80.0000 mg | ORAL_TABLET | Freq: Every evening | ORAL | Status: DC
  Administered 2016-04-13 – 2016-04-15 (×3): 80 mg via ORAL
  Filled 2016-04-13 (×3): qty 2

## 2016-04-13 MED ORDER — SODIUM CHLORIDE 0.9 % IV SOLN
INTRAVENOUS | Status: DC
  Administered 2016-04-13 – 2016-04-15 (×3): via INTRAVENOUS

## 2016-04-13 MED ORDER — TORSEMIDE 20 MG PO TABS
20.0000 mg | ORAL_TABLET | Freq: Two times a day (BID) | ORAL | Status: DC
  Administered 2016-04-13 – 2016-04-16 (×7): 20 mg via ORAL
  Filled 2016-04-13 (×7): qty 1

## 2016-04-13 MED ORDER — PANTOPRAZOLE SODIUM 40 MG PO TBEC
40.0000 mg | DELAYED_RELEASE_TABLET | Freq: Two times a day (BID) | ORAL | Status: DC
  Administered 2016-04-13 – 2016-04-16 (×7): 40 mg via ORAL
  Filled 2016-04-13 (×7): qty 1

## 2016-04-13 MED ORDER — HYDROCODONE-ACETAMINOPHEN 5-325 MG PO TABS
1.0000 | ORAL_TABLET | Freq: Four times a day (QID) | ORAL | Status: DC | PRN
  Administered 2016-04-13: 1 via ORAL
  Filled 2016-04-13: qty 1

## 2016-04-13 MED ORDER — MORPHINE SULFATE (PF) 2 MG/ML IV SOLN: 0.5000 mg | INTRAVENOUS | Status: DC | PRN

## 2016-04-13 MED ORDER — GABAPENTIN 300 MG PO CAPS
600.0000 mg | ORAL_CAPSULE | Freq: Three times a day (TID) | ORAL | Status: DC
  Administered 2016-04-13 – 2016-04-16 (×10): 600 mg via ORAL
  Filled 2016-04-13 (×11): qty 2

## 2016-04-13 MED ORDER — ENOXAPARIN SODIUM 40 MG/0.4ML ~~LOC~~ SOLN
30.0000 mg | Freq: Every day | SUBCUTANEOUS | Status: DC
  Administered 2016-04-13: 30 mg via SUBCUTANEOUS
  Filled 2016-04-13: qty 0.4

## 2016-04-13 MED ORDER — MAGNESIUM OXIDE 400 (241.3 MG) MG PO TABS
400.0000 mg | ORAL_TABLET | Freq: Two times a day (BID) | ORAL | Status: DC
  Administered 2016-04-13 – 2016-04-16 (×7): 400 mg via ORAL
  Filled 2016-04-13 (×7): qty 1

## 2016-04-13 MED ORDER — CLOPIDOGREL BISULFATE 75 MG PO TABS
75.0000 mg | ORAL_TABLET | Freq: Every day | ORAL | Status: DC
  Administered 2016-04-13 – 2016-04-14 (×2): 75 mg via ORAL
  Filled 2016-04-13 (×2): qty 1

## 2016-04-13 MED ORDER — ONDANSETRON HCL 4 MG PO TABS: 4.0000 mg | ORAL_TABLET | Freq: Four times a day (QID) | ORAL | Status: DC | PRN

## 2016-04-13 MED ORDER — HYDRALAZINE HCL 25 MG PO TABS
25.0000 mg | ORAL_TABLET | Freq: Three times a day (TID) | ORAL | Status: DC
  Administered 2016-04-13 – 2016-04-15 (×9): 25 mg via ORAL
  Filled 2016-04-13 (×11): qty 1

## 2016-04-13 MED ORDER — METOPROLOL SUCCINATE ER 100 MG PO TB24
100.0000 mg | ORAL_TABLET | Freq: Every day | ORAL | Status: DC
  Administered 2016-04-13 – 2016-04-16 (×4): 100 mg via ORAL
  Filled 2016-04-13 (×4): qty 1

## 2016-04-13 MED ORDER — ACETAMINOPHEN 650 MG RE SUPP: 650.0000 mg | Freq: Four times a day (QID) | RECTAL | Status: DC | PRN

## 2016-04-13 NOTE — Progress Notes (Signed)
[facility] PROGRESS NOTE  Kristen Barron [id number] DOB: 03-Jan-1929 DOA: 04/12/2016 PCP: Baltazar Apo, MD  HPI/Subjective: Patient had abdominal pain yesterday. This morning did not have much abdominal pain. Her major complaint wear her knee pain in her legs.  Objective: Vitals:   04/13/16 0123 04/13/16 0541  BP: (!) 175/71 (!) 168/61  Pulse: 84 84  Resp: (!) 22 20  Temp: 98.4 F (36.9 C) 98.1 F (36.7 C)    Filed Weights   04/12/16 1545 04/13/16 0123  Weight: 89.8 kg (198 lb) 92 kg (202 lb 14.4 oz)    ROS: Review of Systems  Constitutional: Negative for chills and fever.  Eyes: Negative for blurred vision.  Respiratory: Negative for cough and shortness of breath.   Cardiovascular: Negative for chest pain.  Gastrointestinal: Positive for abdominal pain. Negative for constipation, diarrhea, nausea and vomiting.  Genitourinary: Negative for dysuria.  Musculoskeletal: Positive for joint pain.  Neurological: Negative for dizziness and headaches.   Exam: Physical Exam  Constitutional: She is oriented to person, place, and time.  HENT:  Nose: No mucosal edema.  Mouth/Throat: No oropharyngeal exudate or posterior oropharyngeal edema.  Eyes: Conjunctivae, EOM and lids are normal. Pupils are equal, round, and reactive to light.  Neck: No JVD present. Carotid bruit is not present. No edema present. No thyroid mass and no thyromegaly present.  Cardiovascular: S1 normal and S2 normal.  Exam reveals no gallop.   No murmur heard. Pulses:      Dorsalis pedis pulses are 2+ on the right side, and 2+ on the left side.  Respiratory: No respiratory distress. She has no wheezes. She has no rhonchi. She has no rales.  GI: Soft. Bowel sounds are normal. There is tenderness in the epigastric area.  Musculoskeletal:       Right ankle: She exhibits swelling.       Left ankle: She exhibits swelling.  Lymphadenopathy:    She has no cervical adenopathy.  Neurological: She is alert and  oriented to person, place, and time. No cranial nerve deficit.  Skin: Skin is warm. No rash noted. Nails show no clubbing.  Psychiatric: She has a normal mood and affect.      Data Reviewed: Basic Metabolic Panel:  Recent Labs Lab 04/08/16 2117 04/09/16 0549 04/12/16 1553 04/13/16 0600  NA 133* 131* 136 138  K 4.3 4.1 4.8 4.0  CL 99* 99* 102 106  CO2 27 26 26 25   GLUCOSE 143* 131* 119* 101*  BUN 11 9 16 13   CREATININE 1.31* 1.24* 1.54* 1.26*  CALCIUM 8.7* 8.6* 8.9 8.2*  MG  --   --  2.4  --   PHOS  --   --  4.0  --    Liver Function Tests:  Recent Labs Lab 04/08/16 2117 04/12/16 1553 04/13/16 0600  AST 22 21 18   ALT 11* 13* 11*  ALKPHOS 63 61 51  BILITOT 0.7 0.4 0.7  PROT 7.1 7.6 6.2*  ALBUMIN 3.6 3.8 3.1*    Recent Labs Lab 04/08/16 2117 04/12/16 1553  LIPASE 19 14   CBC:  Recent Labs Lab 04/08/16 2117 04/09/16 0549 04/12/16 1553 04/13/16 0600  WBC 6.2 6.7 6.4 5.8  HGB 10.6* 10.3* 11.4* 9.8*  HCT 30.4* 30.1* 32.7* 28.9*  MCV 85.6 86.4 86.9 86.3  PLT 233 219 243 213   Cardiac Enzymes:  Recent Labs Lab 04/08/16 2117 04/12/16 1553  TROPONINI <0.03 <0.03    CBG:  Recent Labs Lab 04/13/16 0126 04/13/16 0302  GLUCAP 104* 119*      Studies: US Abdomen Limited Ruq  Result Date: 04/12/2016 CLINICAL DATA:  82 year old female with abdominal pain. EXAM: US ABDOMEN LIMITED - RIGHT UPPER QUADRANT COMPARISON:  CT dated 04/09/2016 FINDINGS: Gallbladder: There multiple small stones within the gallbladder. The gallbladder is mildly distended. There is no gallbladder wall thickening or pericholecystic fluid. Positive sonographic Murphy's sign. Common bile duct: Diameter: 3 mm Liver: No focal lesion identified. Within normal limits in parenchymal echogenicity. IMPRESSION: Cholelithiasis without definite sonographic evidence of acute cholecystitis. A hepatobiliary scintigraphy may provide better evaluation of the gallbladder if an acute cholecystitis is  clinically suspected. Electronically Signed   By: Anner Crete M.D.   On: 04/12/2016 20:29    Scheduled Meds: . clopidogrel  75 mg Oral Q breakfast  . enoxaparin (LOVENOX) injection  30 mg Subcutaneous QHS  . gabapentin  600 mg Oral TID  . hydrALAZINE  25 mg Oral TID  . insulin aspart  0-15 Units Subcutaneous Q4H  . insulin detemir  18 Units Subcutaneous QHS  . magnesium oxide  400 mg Oral BID  . metoprolol succinate  100 mg Oral Daily  . pantoprazole  40 mg Oral BID AC  . pravastatin  80 mg Oral QPM  . torsemide  20 mg Oral BID   Continuous Infusions: . sodium chloride 30 mL/hr at 04/13/16 0837    Assessment/Plan:  1. Abdominal pain with nausea vomiting. Recent CT scan negative. Ultrasound of the abdomen showed gallstones. HIDA scan ordered but since the patient has not had a meal in the last 24 hours a HIDA scan will have to be done tomorrow. Consider endoscopy if HIDA scan negative. I will give a trial of Reglan to see if that helps. Just in case this is diabetic gastroparesis. 2. Acute kidney injury on chronic kidney disease. Continue gentle IV fluid hydration decreased the rate. 3. Type 2 diabetes mellitus on Levemir insulin and sliding scale 4. Essential hypertension on hydralazine 5. Hyperlipidemia unspecified on pravastatin 6. History of stroke on Plavix 7. History of heart failure no signs currently.  Code Status:     Code Status Orders        Start     Ordered   04/13/16 0113  Full code  Continuous     04/13/16 0112    Code Status History    Date Active Date Inactive Code Status Order ID Comments User Context   04/09/2016  5:44 AM 04/09/2016  5:32 PM Full Code [id number]  Saundra Shelling, MD Inpatient   07/24/2013  2:19 PM 07/26/2013  8:38 PM Full Code [id number]  Charlie Pitter, MD Inpatient   07/20/2013  1:36 PM 07/24/2013  2:19 PM Full Code [id number]  Othella Boyer, MD Inpatient     Disposition Plan: To be determined based on clinical  course  Consultants:  Surgery  Gastroenterology  Time spent: 35 minutes  [place], [place]

## 2016-04-13 NOTE — H&P (Signed)
[place] [facility] Admission History and Physical Harvie Bridge, D.O.  ---------------------------------------------------------------------------------------------------------------------   PATIENT NAME: Kristen Barron MR#: [id number] DATE OF BIRTH: June 25, 1929 DATE OF ADMISSION: 04/12/2016 PRIMARY CARE PHYSICIAN: Baltazar Apo, MD  REQUESTING/REFERRING PHYSICIAN: ED Dr. Jacqualine Code  CHIEF COMPLAINT: Chief Complaint  Patient presents with  . Abdominal Pain    pt was last seen here for gallstones thursday    HISTORY OF PRESENT ILLNESS: Kristen Barron is a 82 y.o. female with a known history of DM, CHF, HTN, CKD, CVA was in a usual state of health until her hospitalization 04/09/16 when she was admitted for LLQ abdominal pain with gallstones.  She was Diagnosed with gastroenteritis, discharged home and since leaving the hospital she has had continued abdominal pain, decreased appetite and nausea.  Her daughter states that she has had diarrhea for the last 4 days which is loose, not foul-smelling. She also had some chills but no fever.  Her only other complaint is that she has discomfort in her legs from chronic neuropathy. She does take gabapentin 3 times a day but feels that it is not working as well as it did when she first started it.  She was evaluated by general surgery in the emergency department who recommended admission for IV fluids, HIDA scan and GI consultation.   Otherwise there has been no change in status. Patient has been taking medication as prescribed and there has been no recent change in medication or diet.  There has been no recent illness, travel or sick contacts.    Patient denies fevers, weakness, dizziness, chest pain, shortness of breath,  dysuria/frequency, changes in mental status.    PAST MEDICAL HISTORY: Past Medical History:  Diagnosis Date  . Arthritis   . Breast cancer ([place]) 03/06/2014   right breast with mastectomy and chemo  . Cancer [facility])     Reports lumpectomy for a cyst  . CHF (congestive heart failure) ([place])   . Collagen vascular disease ([place])   . Diabetes ([place])   . DVT (deep venous thrombosis) ([place])   . Hemorrhoids   . Hypertension   . Renal disorder   . Renal insufficiency   . Stroke [facility])    August 2014, but has had strokes prior as well      PAST SURGICAL HISTORY: Past Surgical History:  Procedure Laterality Date  . ABDOMINAL HYSTERECTOMY     Patient not clear as to why  . BACK SURGERY    . BREAST BIOPSY Right February 15 2014   invasive mammary carcinoma/ER/PR negative, HER 2 positive  . BREAST BIOPSY Right 1999   negative biopsy  . BREAST SURGERY Right 03/06/14   mastectomy  . HAND SURGERY Right    Carpel tunnel release in the 1970s  . LUMBAR LAMINECTOMY/DECOMPRESSION MICRODISCECTOMY Bilateral 07/24/2013   Procedure: LUMBAR LAMINECTOMY/DECOMPRESSION MICRODISCECTOMY LUMBAR THREE-FOUR;  Surgeon: Charlie Pitter, MD;  Location: [place] [facility];  Service: Neurosurgery;  Laterality: Bilateral;  . MASTECTOMY Right 03/06/2014   right br ca      SOCIAL HISTORY: Social History  Substance Use Topics  . Smoking status: Never Smoker  . Smokeless tobacco: Never Used  . Alcohol use No      FAMILY HISTORY: Family History  Problem Relation Age of Onset  . Cancer Sister 18    breast  . Breast cancer Sister 38  . Cancer Brother     kidney  . Cancer Maternal Aunt     breast  . Breast cancer Maternal  Aunt 70  . Cancer Other     maternal niece with breast cancer  . Breast cancer Other   . Cancer Sister     pancreatic     MEDICATIONS AT HOME: Prior to Admission medications   Medication Sig Start Date End Date Taking? Authorizing Provider  acetaminophen (TYLENOL) 500 MG tablet Take 500 mg by mouth every 6 (six) hours as needed.   Yes Historical Provider, MD  clopidogrel (PLAVIX) 75 MG tablet Take 75 mg by mouth daily with breakfast.   Yes Historical Provider, MD  gabapentin (NEURONTIN) 300 MG capsule Take 600  mg by mouth 3 (three) times daily.  12/04/13  Yes Historical Provider, MD  Glucosamine-Chondroit-Vit C-Mn (GLUCOSAMINE 1500 COMPLEX PO) Take by mouth 3 (three) times daily.   Yes Historical Provider, MD  hydrALAZINE (APRESOLINE) 25 MG tablet Take 25 mg by mouth 3 (three) times daily.   Yes Historical Provider, MD  HYDROcodone-acetaminophen (NORCO/VICODIN) 5-325 MG per tablet Take 1 tablet by mouth every 6 (six) hours as needed.  02/16/14  Yes Historical Provider, MD  insulin aspart (NOVOLOG) 100 UNIT/ML injection Inject 8 Units into the skin 3 (three) times daily before meals.   Yes Historical Provider, MD  insulin detemir (LEVEMIR) 100 UNIT/ML injection Inject 18 Units into the skin at bedtime.   Yes Historical Provider, MD  magnesium oxide (MAG-OX) 400 MG tablet Take 400 mg by mouth 2 (two) times daily.   Yes Historical Provider, MD  Melatonin 5 MG TABS Take 5 mg by mouth at bedtime.   Yes Historical Provider, MD  metoprolol succinate (TOPROL-XL) 100 MG 24 hr tablet Take 100 mg by mouth daily. Take with or immediately following a meal.   Yes Historical Provider, MD  Omega 3 1200 MG CAPS Take by mouth 2 (two) times daily.   Yes Historical Provider, MD  ondansetron (ZOFRAN) 4 MG tablet Take 4 mg by mouth every 8 (eight) hours as needed for nausea or vomiting.   Yes Historical Provider, MD  pantoprazole (PROTONIX) 40 MG tablet Take 1 tablet (40 mg total) by mouth 2 (two) times daily before a meal. 04/09/16  Yes Srikar Sudini, MD  pravastatin (PRAVACHOL) 80 MG tablet Take 80 mg by mouth every evening.    Yes Historical Provider, MD  torsemide (DEMADEX) 20 MG tablet Take 20 mg by mouth 2 (two) times daily.   Yes Historical Provider, MD  traMADol (ULTRAM) 50 MG tablet Take 1 tablet (50 mg total) by mouth every 12 (twelve) hours as needed for severe pain. 04/09/16  Yes Srikar Sudini, MD      DRUG ALLERGIES: No Known Allergies   REVIEW OF SYSTEMS: CONSTITUTIONAL: No fever/chills, fatigue, weakness,  weight gain/loss, headache EYES: No blurry or double vision. ENT: No tinnitus, postnasal drip, redness or soreness of the oropharynx. RESPIRATORY: No cough, wheeze, hemoptysis, dyspnea. CARDIOVASCULAR: No chest pain, orthopnea, palpitations, syncope. GASTROINTESTINAL: No nausea, vomiting, constipation, diarrhea, abdominal pain, hematemesis, melena or hematochezia. GENITOURINARY: No dysuria or hematuria. ENDOCRINE: No polyuria or nocturia. No heat or cold intolerance. HEMATOLOGY: No anemia, bruising, bleeding. INTEGUMENTARY: No rashes, ulcers, lesions. MUSCULOSKELETAL: No arthritis, swelling, gout. NEUROLOGIC: No numbness, tingling, weakness or ataxia. No seizure-type activity. PSYCHIATRIC: No anxiety, depression, insomnia.  PHYSICAL EXAMINATION: VITAL SIGNS: Blood pressure (!) 163/61, pulse 76, temperature 98.1 F (36.7 C), temperature source Oral, resp. rate 19, height 5\' 2"  (1.575 m), weight 89.8 kg (198 lb), SpO2 95 %.  GENERAL: 82 y.o.-year-old white female patient, pale well-developed, well-nourished lying in the  bed in no acute distress.  Pleasant and cooperative.   HEENT: Head atraumatic, normocephalic. Pupils equal, round, reactive to light and accommodation. No scleral icterus. Extraocular muscles intact. Nares are patent. Oropharynx is clear. Mucus membranes moist. NECK: Supple, full range of motion. No JVD, no bruit heard. No thyroid enlargement, no tenderness, no cervical lymphadenopathy. CHEST: Normal breath sounds bilaterally. No wheezing, rales, rhonchi or crackles. No use of accessory muscles of respiration.  No reproducible chest wall tenderness.  CARDIOVASCULAR: S1, S2 normal. No murmurs, rubs, or gallops. Cap refill <2 seconds. ABDOMEN: Soft, nontender, nondistended. No rebound, guarding, rigidity. Normoactive bowel sounds present in all four quadrants. No organomegaly or mass. EXTREMITIES: Full range of motion. No pedal edema, cyanosis, or clubbing. NEUROLOGIC: Cranial  nerves II through XII are grossly intact with no focal sensorimotor deficit. Muscle strength 5/5 in all extremities. Sensation intact. Gait not checked. PSYCHIATRIC: The patient is alert and oriented x 3. Normal affect, mood, thought content. SKIN: Warm, dry, and intact without obvious rash, lesion, or ulcer.  LABORATORY PANEL:  CBC  Recent Labs Lab 04/12/16 1553  WBC 6.4  HGB 11.4*  HCT 32.7*  PLT 243   ----------------------------------------------------------------------------------------------------------------- Chemistries  Recent Labs Lab 04/12/16 1553  NA 136  K 4.8  CL 102  CO2 26  GLUCOSE 119*  BUN 16  CREATININE 1.54*  CALCIUM 8.9  AST 21  ALT 13*  ALKPHOS 61  BILITOT 0.4   ------------------------------------------------------------------------------------------------------------------ Cardiac Enzymes  Recent Labs Lab 04/12/16 1553  TROPONINI <0.03   ------------------------------------------------------------------------------------------------------------------  RADIOLOGY: US Abdomen Limited Ruq  Result Date: 04/12/2016 CLINICAL DATA:  82 year old female with abdominal pain. EXAM: US ABDOMEN LIMITED - RIGHT UPPER QUADRANT COMPARISON:  CT dated 04/09/2016 FINDINGS: Gallbladder: There multiple small stones within the gallbladder. The gallbladder is mildly distended. There is no gallbladder wall thickening or pericholecystic fluid. Positive sonographic Murphy's sign. Common bile duct: Diameter: 3 mm Liver: No focal lesion identified. Within normal limits in parenchymal echogenicity. IMPRESSION: Cholelithiasis without definite sonographic evidence of acute cholecystitis. A hepatobiliary scintigraphy may provide better evaluation of the gallbladder if an acute cholecystitis is clinically suspected. Electronically Signed   By: Anner Crete M.D.   On: 04/12/2016 20:29    EKG: Normal sinus rhythm at 75 bpm with normal axis and nonspecific ST and T wave  changes.  IMPRESSION AND PLAN:  This is a 82 y.o. female with a history of  DM, CHF, HTN, CKD, CVA  now being admitted with: 1. Left lower quadrant  abdominal pain with ultrasound evidence of gallstones but no acute cholecystitis. Recent CT showed evidence of diverticulosis without diverticulitis. Patient was recently hospitalized for the same thing, diagnosis of gastroenteritis and discharged home but she continues to have epigastric pain and left lower quadrant pain associated with decreased appetite. Therefore we will admit her for IV fluid hydration. We appreciate the recommendations of the general surgeon and we'll pursue a GI consult as well as a HIDA scan to rule out biliary colic as the source of her pain however given the location and nature of her pain being in the left lower quadrant I have a low-dose index of suspicion for gallbladder disease. I will order antiemetics and pain control. She will be nothing by mouth for the HIDA scan. 2. AK I on CK D-IV fluid hydration and recheck BMP in the a.m. 3. Diabetes-continue bedtime insulin and cover with regular insulin sliding scale every 4 hours 4. Hypertension, CHF, CVA-continue home medications   Diet/Nutrition: Nothing by  mouth Fluids: IV normal saline DVT Px: Lovenox, SCDs and early ambulation Code Status: Full  All the records are reviewed and case discussed with ED provider. Management plans discussed with the patient and family who express understanding and agree with plan of care.   TOTAL TIME TAKING CARE OF THIS PATIENT: 60 minutes.   Anwyn Kriegel D.O. on 04/13/2016 at 12:24 AM Between 7am to 6pm - Pager - [phone] After 6pm go to [email or web address] - [employer] [facility] [place] [facility] Office [phone] CC: Primary care physician; Baltazar Apo, MD     Note: This dictation was prepared with Dragon dictation along with smaller phrase technology. Any transcriptional errors that result from this  process are unintentional.

## 2016-04-13 NOTE — Progress Notes (Signed)
Nuclear medicine called and the patient needs to eat a meal within 24 hours of doing test. I will order diet. Npo after midnight for hida test tomorrow.  Dr Leslye Peer

## 2016-04-13 NOTE — Progress Notes (Signed)
Spoke with nuclear tech to do hida scan today. Get rid of pain medications until scan done.

## 2016-04-13 NOTE — Care Management Important Message (Signed)
Important Message  Patient Details  Name: Kristen Barron MRN: [id number] Date of Birth: 1929-04-04   Medicare Important Message Given:  Yes    Wealthy Danielski A, RN 04/13/2016, 7:51 AM

## 2016-04-13 NOTE — Progress Notes (Signed)
CC: Vague abdominal pain Subjective: 82 year old female admitted to the medicine service for continued workup of vague abdominal pain. Patient reports her pain is actually improved today but has not gone away completely. Primary complaints are of a vague abdominal pain as well as multiple joint pains and restless legs.  Objective: Vital signs in last 24 hours: Temp:  [98.1 F (36.7 C)-98.9 F (37.2 C)] 98.9 F (37.2 C) (09/04 1327) Pulse Rate:  [67-84] 67 (09/04 1327) Resp:  [18-22] 18 (09/04 1327) BP: (157-177)/(47-71) 162/47 (09/04 1327) SpO2:  [92 %-97 %] 94 % (09/04 1327) Weight:  [89.8 kg (198 lb)-92 kg (202 lb 14.4 oz)] 92 kg (202 lb 14.4 oz) (09/04 0123) Last BM Date: 04/12/16  Intake/Output from previous day: 09/03 0701 - 09/04 0700 In: 647 [I.V.:647] Out: -  Intake/Output this shift: Total I/O In: 234 [I.V.:234] Out: 1500 [Urine:1500]  Physical exam:  Gen.: No acute distress Chest: Clear to auscultation Heart: Regular rhythm Abdomen: Soft, mildly tender in the midepigastric, nondistended. No evidence of peritonitis, rebound, guarding.  Lab Results: CBC   Recent Labs  04/12/16 1553 04/13/16 0600  WBC 6.4 5.8  HGB 11.4* 9.8*  HCT 32.7* 28.9*  PLT 243 213   BMET  Recent Labs  04/12/16 1553 04/13/16 0600  NA 136 138  K 4.8 4.0  CL 102 106  CO2 26 25  GLUCOSE 119* 101*  BUN 16 13  CREATININE 1.54* 1.26*  CALCIUM 8.9 8.2*   PT/INR No results for input(s): LABPROT, INR in the last 72 hours. ABG No results for input(s): PHART, HCO3 in the last 72 hours.  Invalid input(s): PCO2, PO2  Studies/Results: US Abdomen Limited Ruq  Result Date: 04/12/2016 CLINICAL DATA:  82 year old female with abdominal pain. EXAM: US ABDOMEN LIMITED - RIGHT UPPER QUADRANT COMPARISON:  CT dated 04/09/2016 FINDINGS: Gallbladder: There multiple small stones within the gallbladder. The gallbladder is mildly distended. There is no gallbladder wall thickening or  pericholecystic fluid. Positive sonographic Murphy's sign. Common bile duct: Diameter: 3 mm Liver: No focal lesion identified. Within normal limits in parenchymal echogenicity. IMPRESSION: Cholelithiasis without definite sonographic evidence of acute cholecystitis. A hepatobiliary scintigraphy may provide better evaluation of the gallbladder if an acute cholecystitis is clinically suspected. Electronically Signed   By: Anner Crete M.D.   On: 04/12/2016 20:29    Anti-infectives: Anti-infectives    None      Assessment/Plan:  82 year old female undergoing workup for vague abdominal pains. No obvious evidence of cholecystitis at this time. HIDA is pending but will likely be performed tomorrow. Agree with GI recommendations for other workups. Surgery will continue to follow.  Oliviya Gilkison T. Adonis Huguenin, MD, FACS  04/13/2016

## 2016-04-13 NOTE — Consult Note (Signed)
GI Inpatient Consult Note  Reason for Consult:abdominal pain, some LLQ, some RUQ, some mid upper abd.   Attending Requesting Consult:Dr. Leslye Peer  History of Present Illness: Kristen Barron is a 82 y.o. female With a hx of abd pain for 2-3 months, located in mid abd, RUQ and LLQ.  These are separate pains.  No vomiting, has 3-4 mushy stools a day, no bleeding no nocturnal bowel movements, her last stool was yesterday and was mushy.  The discomfort in the LLQ has essentially gone at this time, she points to the mid upper abdomen and right epigastric area as still cause of discomfort.  US showed gall stone in GB no thickening of the wall, no surrounding effusion.  Past Medical History:  Past Medical History:  Diagnosis Date  . Arthritis   . Breast cancer ([place]) 03/06/2014   right breast with mastectomy and chemo  . Cancer [facility])    Reports lumpectomy for a cyst  . CHF (congestive heart failure) ([place])   . Collagen vascular disease ([place])   . Diabetes ([place])   . DVT (deep venous thrombosis) ([place])   . Hemorrhoids   . Hypertension   . Renal disorder   . Renal insufficiency   . Stroke [facility])    August 2014, but has had strokes prior as well    Problem List: Patient Active Problem List   Diagnosis Date Noted  . Abdominal pain 04/09/2016  . Right lower quadrant pain   . Vomiting   . Gallstone   . Osteoarthritis of knee (Bilateral) (L>R) 12/26/2015  . Chronic pain 12/11/2015  . Long term current use of opiate analgesic 12/11/2015  . Encounter for therapeutic drug level monitoring 12/11/2015  . Chronic knee pain (Location of Primary Source of Pain) (Bilateral) (L>R) 12/11/2015  . Long term prescription opiate use 12/11/2015  . Opiate use (20 MME/Day) 12/11/2015  . Chronic hand pain (Location of Secondary source of pain) (Bilateral) (L>R) 12/11/2015  . Failed back surgical syndrome x 2 (Surgeon: Dr. Deri Fuelling) 12/11/2015  . Lumbar spondylosis 12/11/2015  . Chronic low back pain (Location  of Tertiary source of pain) (Bilateral) (L>R) 12/11/2015  . Lumbar facet syndrome 12/11/2015  . Chronic lower extremity pain (Bilateral) (L>R) 12/11/2015  . Chronic neck pain (Left) 12/11/2015  . Cervical spondylosis 12/11/2015  . Chronic shoulder pain (Right) 12/11/2015  . Insulin dependent diabetes mellitus ([place]) 12/11/2015  . Abnormal MRI, lumbar spine (07/19/2013) 12/11/2015  . Lumbar postlaminectomy syndrome (L4-5) 12/11/2015  . Fusion of lumbar spine (L4-5 Ray cages) 12/11/2015  . Lumbar Levoscoliosis with apex at L4 12/11/2015  . Grade 1 Retrolisthesis of L2 over L3 and L3 over L4 12/11/2015  . Lumbar facet arthropathy 12/11/2015  . Lumbar foraminal stenosis (Bilateral L2-3, L3-4 & Left L5-S1) 12/11/2015  . Chronic anticoagulation (Plavix) 12/11/2015  . Breast cancer of lower-outer quadrant of right female breast ([place]) 02/28/2014  . Lumbar stenosis with neurogenic claudication 07/24/2013  . Lumbar central spinal stenosis ( Severe at L3-4) (Mild R>L L2-3 & L5-S1) 07/20/2013  . Hyponatremia 07/20/2013  . Hypertension 07/20/2013  . Normocytic anemia 07/20/2013  . CKD (chronic kidney disease) stage 3, GFR 30-59 ml/min 07/20/2013  .  Lumbar DDD (degenerative disc disease) 07/20/2013    Past Surgical History: Past Surgical History:  Procedure Laterality Date  . ABDOMINAL HYSTERECTOMY     Patient not clear as to why  . BACK SURGERY    . BREAST BIOPSY Right February 15 2014   invasive mammary carcinoma/ER/PR  negative, HER 2 positive  . BREAST BIOPSY Right 1999   negative biopsy  . BREAST SURGERY Right 03/06/14   mastectomy  . HAND SURGERY Right    Carpel tunnel release in the 1970s  . LUMBAR LAMINECTOMY/DECOMPRESSION MICRODISCECTOMY Bilateral 07/24/2013   Procedure: LUMBAR LAMINECTOMY/DECOMPRESSION MICRODISCECTOMY LUMBAR THREE-FOUR;  Surgeon: Charlie Pitter, MD;  Location: [place] [facility];  Service: Neurosurgery;  Laterality: Bilateral;  . MASTECTOMY Right 03/06/2014   right br ca     Allergies: No Known Allergies  Home Medications: Facility-Administered Medications Prior to Admission  Medication Dose Route Frequency Provider Last Rate Last Dose  . Sodium Hyaluronate SOSY 2 mL  2 mL Intra-articular Once Milinda Pointer, MD       Prescriptions Prior to Admission  Medication Sig Dispense Refill Last Dose  . acetaminophen (TYLENOL) 500 MG tablet Take 500 mg by mouth every 6 (six) hours as needed.   04/12/2016 at Unknown time  . clopidogrel (PLAVIX) 75 MG tablet Take 75 mg by mouth daily with breakfast.   04/12/2016 at Unknown time  . gabapentin (NEURONTIN) 300 MG capsule Take 600 mg by mouth 3 (three) times daily.    04/12/2016 at Unknown time  . Glucosamine-Chondroit-Vit C-Mn (GLUCOSAMINE 1500 COMPLEX PO) Take by mouth 3 (three) times daily.   04/12/2016 at Unknown time  . hydrALAZINE (APRESOLINE) 25 MG tablet Take 25 mg by mouth 3 (three) times daily.   04/12/2016 at Unknown time  . HYDROcodone-acetaminophen (NORCO/VICODIN) 5-325 MG per tablet Take 1 tablet by mouth every 6 (six) hours as needed.    04/12/2016 at Unknown time  . insulin aspart (NOVOLOG) 100 UNIT/ML injection Inject 8 Units into the skin 3 (three) times daily before meals.   04/12/2016 at Unknown time  . insulin detemir (LEVEMIR) 100 UNIT/ML injection Inject 18 Units into the skin at bedtime.   04/11/2016 at Unknown time  . magnesium oxide (MAG-OX) 400 MG tablet Take 400 mg by mouth 2 (two) times daily.   04/12/2016 at Unknown time  . Melatonin 5 MG TABS Take 5 mg by mouth at bedtime.   04/11/2016 at Unknown time  . metoprolol succinate (TOPROL-XL) 100 MG 24 hr tablet Take 100 mg by mouth daily. Take with or immediately following a meal.   04/12/2016 at Unknown time  . Omega 3 1200 MG CAPS Take by mouth 2 (two) times daily.   04/12/2016 at Unknown time  . ondansetron (ZOFRAN) 4 MG tablet Take 4 mg by mouth every 8 (eight) hours as needed for nausea or vomiting.   04/12/2016 at Unknown time  . pantoprazole (PROTONIX) 40 MG tablet  Take 1 tablet (40 mg total) by mouth 2 (two) times daily before a meal. 60 tablet 0 04/12/2016 at Unknown time  . pravastatin (PRAVACHOL) 80 MG tablet Take 80 mg by mouth every evening.    04/11/2016 at Unknown time  . torsemide (DEMADEX) 20 MG tablet Take 20 mg by mouth 2 (two) times daily.   04/12/2016 at Unknown time  . traMADol (ULTRAM) 50 MG tablet Take 1 tablet (50 mg total) by mouth every 12 (twelve) hours as needed for severe pain. 15 tablet 0 04/12/2016 at Unknown time   Home medication reconciliation was completed with the patient.   Scheduled Inpatient Medications:   . clopidogrel  75 mg Oral Q breakfast  . enoxaparin (LOVENOX) injection  30 mg Subcutaneous QHS  . gabapentin  600 mg Oral TID  . hydrALAZINE  25 mg Oral TID  . insulin aspart  0-15 Units Subcutaneous Q4H  . insulin detemir  18 Units Subcutaneous QHS  . magnesium oxide  400 mg Oral BID  . metoprolol succinate  100 mg Oral Daily  . pantoprazole  40 mg Oral BID AC  . pravastatin  80 mg Oral QPM  . torsemide  20 mg Oral BID    Continuous Inpatient Infusions:   . sodium chloride 30 mL/hr at 04/13/16 0837    PRN Inpatient Medications:  acetaminophen **OR** acetaminophen, ondansetron **OR** ondansetron (ZOFRAN) IV  Family History: family history includes Breast cancer in her other; Breast cancer (age of onset: 33) in her sister; Breast cancer (age of onset: 75) in her maternal aunt; Cancer in her brother, maternal aunt, other, and sister; Cancer (age of onset: 60) in her sister.  The patient's family history is negative for inflammatory bowel disorders, GI malignancy, or solid organ transplantation.  Social History:   reports that she has never smoked. She has never used smokeless tobacco. She reports that she does not drink alcohol or use drugs. The patient denies ETOH, tobacco, or drug use.   Review of Systems: Constitutional: Weight is stable.  Eyes: No changes in vision. ENT: No oral lesions, sore throat.  GI:  see HPI.  Heme/Lymph: No easy bruising.  CV: No chest pain.  GU: No hematuria.  Integumentary: No rashes.  Neuro: No headaches.  Psych: No depression/anxiety.  Endocrine: No heat/cold intolerance.  Allergic/Immunologic: No urticaria.  Resp: No cough, SOB.  Musculoskeletal: No joint swelling.    Physical Examination: BP (!) 168/61 (BP Location: Right Arm)   Pulse 84   Temp 98.1 F (36.7 C) (Oral)   Resp 20   Ht 5\' 2"  (1.575 m)   Wt 92 kg (202 lb 14.4 oz)   SpO2 93%   BMI 37.11 kg/m  Gen: NAD, alert and oriented x 4 HEENT: PEERLA, EOMI, Neck: supple, no JVD or thyromegaly Chest: CTA bilaterally, no wheezes, crackles, or other adventitious sounds CV: RRR, no m/g/c/r Abd: soft, some minimal tenderness in epigastric area, RUQ, and suprapubic and slight in LLQ. Ext: no edema, well perfused with 2+ pulses, Skin: no rash or lesions noted Lymph: no LAD  Data: Lab Results  Component Value Date   WBC 5.8 04/13/2016   HGB 9.8 (L) 04/13/2016   HCT 28.9 (L) 04/13/2016   MCV 86.3 04/13/2016   PLT 213 04/13/2016    Recent Labs Lab 04/09/16 0549 04/12/16 1553 04/13/16 0600  HGB 10.3* 11.4* 9.8*   Lab Results  Component Value Date   NA 138 04/13/2016   K 4.0 04/13/2016   CL 106 04/13/2016   CO2 25 04/13/2016   BUN 13 04/13/2016   CREATININE 1.26 (H) 04/13/2016   Lab Results  Component Value Date   ALT 11 (L) 04/13/2016   AST 18 04/13/2016   ALKPHOS 51 04/13/2016   BILITOT 0.7 04/13/2016   No results for input(s): APTT, INR, PTT in the last 168 hours. Assessment/Plan: Ms. Massoth is a 82 y.o. female with abdominal pain for 2-3 months with minimal findings on PE, gall stone on CT,  Minimal findings on PE of abdomen. This is not a surgical abdomen at this time.  She may have gastroparesis given her diabetes.  She may have IBS given change in stools 2-3 months ago  And absence of findings on CT and physical exam.  She does have anemia of uncertain origin and would work  this up also.  Recommendations:Agree with HIDA scan, consider EGD, colonoscopy,  trial of Reglan.    Thank you for the consult. Please call with questions or concerns.  Gaylyn Cheers, MD

## 2016-04-13 NOTE — Care Management Note (Addendum)
Case Management Note  Patient Details  Name: DARREL HOLTMEYER MRN: [id number] Date of Birth: [birth year]  Subjective/Objective:    82yo Mrs Kyauna Rearick was admitted with epigastric pain. Dx with gallstones and diverticulitis. Resides at home with her daughter and x-husband. PCP=Dr Denton Lank, [facility]. Pharmacy=[facility] on [address]. Uses a RW at home. Denies any other DME equipment. No home oxygen, no home health services. Attempted to discuss choice of home health providers but Mrs Borsa refused stating " I don't want any home health. My granddaughter works at the [employer] and she looks after me." Complaining about chronic leg pain, restless legs. Both legs twitching throughout this assessment. Case management will follow for discharge planning. Anticipate discharge home with home health.                 Action/Plan:   Expected Discharge Date:                  Expected Discharge Plan:     In-House Referral:     Discharge planning Services     Post Acute Care Choice:    Choice offered to:     DME Arranged:    DME Agency:     HH Arranged:    HH Agency:     Status of Service:     If discussed at [employer] of Stay Meetings, dates discussed:    Additional Comments:  Mylz Yuan A, RN 04/13/2016, 3:19 PM

## 2016-04-14 ENCOUNTER — Inpatient Hospital Stay: Payer: Medicare Other

## 2016-04-14 LAB — GLUCOSE, CAPILLARY
GLUCOSE-CAPILLARY: 190 mg/dL — AB (ref 65–99)
Glucose-Capillary: 83 mg/dL (ref 65–99)

## 2016-04-14 MED ORDER — TECHNETIUM TC 99M MEBROFENIN IV KIT
5.0000 | PACK | Freq: Once | INTRAVENOUS | Status: AC | PRN
  Administered 2016-04-14: 5.288 via INTRAVENOUS

## 2016-04-14 MED ORDER — PEG 3350-KCL-NA BICARB-NACL 420 G PO SOLR
2000.0000 mL | Freq: Once | ORAL | Status: AC
  Administered 2016-04-15: 2000 mL via ORAL
  Filled 2016-04-14: qty 4000

## 2016-04-14 MED ORDER — DEXTROSE 50 % IV SOLN
INTRAVENOUS | Status: AC
  Administered 2016-04-14: 25 mL
  Filled 2016-04-14: qty 50

## 2016-04-14 MED ORDER — SODIUM CHLORIDE 0.9 % IV SOLN: INTRAVENOUS | Status: DC

## 2016-04-14 MED ORDER — PEG 3350-KCL-NA BICARB-NACL 420 G PO SOLR
2000.0000 mL | Freq: Once | ORAL | Status: AC
  Administered 2016-04-14: 2000 mL via ORAL
  Filled 2016-04-14: qty 4000

## 2016-04-14 MED ORDER — OXYCODONE HCL 5 MG PO TABS
5.0000 mg | ORAL_TABLET | Freq: Four times a day (QID) | ORAL | Status: DC | PRN
  Administered 2016-04-16: 5 mg via ORAL
  Filled 2016-04-14: qty 1

## 2016-04-14 MED ORDER — ENOXAPARIN SODIUM 40 MG/0.4ML ~~LOC~~ SOLN: 40.0000 mg | Freq: Every day | SUBCUTANEOUS | Status: DC

## 2016-04-14 MED ORDER — POLYETHYLENE GLYCOL 3350 17 G PO PACK
17.0000 g | PACK | Freq: Every day | ORAL | Status: DC
  Administered 2016-04-14: 17 g via ORAL
  Filled 2016-04-14 (×2): qty 1

## 2016-04-14 NOTE — Progress Notes (Signed)
Anticoagulation monitoring(Lovenox):  82yo  female ordered Lovenox 30 mg Q24h  Filed Weights   04/12/16 1545 04/13/16 0123  Weight: 198 lb (89.8 kg) 202 lb 14.4 oz (92 kg)   BMI 37   Lab Results  Component Value Date   CREATININE 1.26 (H) 04/13/2016   CREATININE 1.54 (H) 04/12/2016   CREATININE 1.24 (H) 04/09/2016   Estimated Creatinine Clearance: 35.1 mL/min (by C-G formula based on SCr of 1.26 mg/dL). Hemoglobin & Hematocrit     Component Value Date/Time   HGB 9.8 (L) 04/13/2016 0600   HGB 9.7 (L) 11/28/2014 1244   HCT 28.9 (L) 04/13/2016 0600   HCT 29.9 (L) 11/28/2014 1244    Pt renal function has improved  Per Protocol for Patient with estCrcl > 30 ml/min and BMI < 40, will transition to Lovenox 40 mg Q24h  Alaynah Schutter D Rafferty Postlewait, Pharm.D Clinical Pharmacist

## 2016-04-14 NOTE — Progress Notes (Signed)
Patient ID: Kristen Barron, female   DOB: 08-29-1928, 82 y.o.   MRN: [id number]  [facility] PROGRESS NOTE  Kristen Barron [id number] DOB: September 07, 1928 DOA: 04/12/2016 PCP: Baltazar Apo, MD  HPI/Subjective: Patient feels okay. Stated she ate okay last night. HIDA scan today negative. Still having pain in the abdomen going on for months. In speaking with the husband she does get diarrhea when she has this pain.  Objective: Vitals:   04/14/16 1044 04/14/16 1323  BP: (!) 156/42 (!) 146/68  Pulse: 82 73  Resp:  20  Temp: 98.1 F (36.7 C) 97.9 F (36.6 C)    Filed Weights   04/12/16 1545 04/13/16 0123  Weight: 89.8 kg (198 lb) 92 kg (202 lb 14.4 oz)    ROS: Review of Systems  Constitutional: Negative for chills and fever.  Eyes: Negative for blurred vision.  Respiratory: Negative for cough and shortness of breath.   Cardiovascular: Negative for chest pain.  Gastrointestinal: Positive for abdominal pain. Negative for constipation, diarrhea, nausea and vomiting.  Genitourinary: Negative for dysuria.  Musculoskeletal: Negative for joint pain.  Neurological: Negative for dizziness and headaches.   Exam: Physical Exam  Constitutional: She is oriented to person, place, and time.  HENT:  Nose: No mucosal edema.  Mouth/Throat: No oropharyngeal exudate or posterior oropharyngeal edema.  Eyes: Conjunctivae, EOM and lids are normal. Pupils are equal, round, and reactive to light.  Neck: No JVD present. Carotid bruit is not present. No edema present. No thyroid mass and no thyromegaly present.  Cardiovascular: S1 normal and S2 normal.  Exam reveals no gallop.   No murmur heard. Pulses:      Dorsalis pedis pulses are 2+ on the right side, and 2+ on the left side.  Respiratory: No respiratory distress. She has no wheezes. She has no rhonchi. She has no rales.  GI: Soft. Bowel sounds are normal. There is generalized tenderness.  Musculoskeletal:       Right ankle: She exhibits  swelling.       Left ankle: She exhibits swelling.  Lymphadenopathy:    She has no cervical adenopathy.  Neurological: She is alert and oriented to person, place, and time. No cranial nerve deficit.  Skin: Skin is warm. No rash noted. Nails show no clubbing.  Psychiatric: She has a normal mood and affect.      Data Reviewed: Basic Metabolic Panel:  Recent Labs Lab 04/08/16 2117 04/09/16 0549 04/12/16 1553 04/13/16 0600  NA 133* 131* 136 138  K 4.3 4.1 4.8 4.0  CL 99* 99* 102 106  CO2 27 26 26 25   GLUCOSE 143* 131* 119* 101*  BUN 11 9 16 13   CREATININE 1.31* 1.24* 1.54* 1.26*  CALCIUM 8.7* 8.6* 8.9 8.2*  MG  --   --  2.4  --   PHOS  --   --  4.0  --    Liver Function Tests:  Recent Labs Lab 04/08/16 2117 04/12/16 1553 04/13/16 0600  AST 22 21 18   ALT 11* 13* 11*  ALKPHOS 63 61 51  BILITOT 0.7 0.4 0.7  PROT 7.1 7.6 6.2*  ALBUMIN 3.6 3.8 3.1*    Recent Labs Lab 04/08/16 2117 04/12/16 1553  LIPASE 19 14   CBC:  Recent Labs Lab 04/08/16 2117 04/09/16 0549 04/12/16 1553 04/13/16 0600  WBC 6.2 6.7 6.4 5.8  HGB 10.6* 10.3* 11.4* 9.8*  HCT 30.4* 30.1* 32.7* 28.9*  MCV 85.6 86.4 86.9 86.3  PLT 233 219 243 213  Cardiac Enzymes:  Recent Labs Lab 04/08/16 2117 04/12/16 1553  TROPONINI <0.03 <0.03    CBG:  Recent Labs Lab 04/13/16 0126 04/13/16 0302  GLUCAP 104* 119*     Studies: Nm Hepato W/eject Fract  Result Date: 04/14/2016 CLINICAL DATA:  Pain. EXAM: NUCLEAR MEDICINE HEPATOBILIARY IMAGING WITH GALLBLADDER EF TECHNIQUE: Sequential images of the abdomen were obtained out to 60 minutes following intravenous administration of radiopharmaceutical. After oral ingestion of Ensure, gallbladder ejection fraction was determined. At 60 min, normal ejection fraction is greater than 33%. RADIOPHARMACEUTICALS:  5.3 MCi Tc-57m  Choletec IV COMPARISON:  Ultrasound 04/12/2016. FINDINGS: Prompt uptake and biliary excretion of activity by the liver is seen.  Gallbladder activity is visualized, consistent with patency of cystic duct. Biliary activity passes into small bowel, consistent with patent common bile duct. Calculated gallbladder ejection fraction is 58%. (Normal gallbladder ejection fraction with Ensure is greater than 33%.) IMPRESSION: Normal exam. Electronically Signed   By: Marcello Moores  Register   On: 04/14/2016 10:05   US Abdomen Limited Ruq  Result Date: 04/12/2016 CLINICAL DATA:  82 year old female with abdominal pain. EXAM: US ABDOMEN LIMITED - RIGHT UPPER QUADRANT COMPARISON:  CT dated 04/09/2016 FINDINGS: Gallbladder: There multiple small stones within the gallbladder. The gallbladder is mildly distended. There is no gallbladder wall thickening or pericholecystic fluid. Positive sonographic Murphy's sign. Common bile duct: Diameter: 3 mm Liver: No focal lesion identified. Within normal limits in parenchymal echogenicity. IMPRESSION: Cholelithiasis without definite sonographic evidence of acute cholecystitis. A hepatobiliary scintigraphy may provide better evaluation of the gallbladder if an acute cholecystitis is clinically suspected. Electronically Signed   By: Anner Crete M.D.   On: 04/12/2016 20:29    Scheduled Meds: . clopidogrel  75 mg Oral Q breakfast  . enoxaparin (LOVENOX) injection  40 mg Subcutaneous QHS  . gabapentin  600 mg Oral TID  . hydrALAZINE  25 mg Oral TID  . insulin aspart  0-15 Units Subcutaneous Q4H  . insulin detemir  18 Units Subcutaneous QHS  . magnesium oxide  400 mg Oral BID  . metoCLOPramide (REGLAN) injection  5 mg Intravenous TID AC & HS  . metoprolol succinate  100 mg Oral Daily  . pantoprazole  40 mg Oral BID AC  . pravastatin  80 mg Oral QPM  . torsemide  20 mg Oral BID   Continuous Infusions: . sodium chloride 30 mL/hr at 04/14/16 0031    Assessment/Plan:  1. Abdominal pain with nausea vomiting. Recent CT scan of the abdomen and pelvis negative. Ultrasound of the abdomen showed gallstones. HIDA  scan negative. Spoke with Dr. Vira Agar gastroenterology and he will do an endoscopy tomorrow. I will give a trial of Reglan to see if that helps just in case this is diabetic gastroparesis. Husband also gave history of that she has diarrhea when she has this pain. Could be an IBS versus constipation because she has not had any bowel movements here. Start MiraLAX. 2. Acute kidney injury on chronic kidney disease continue gentle IV fluid hydration 3. Type 2 diabetes mellitus. On Levemir and insulin sliding scale 4. Essential hypertension on hydralazine 5. Hyperlipidemia unspecified on pravastatin 6. History of stroke on Plavix 7. No signs of heart failure currently  Code Status:     Code Status Orders        Start     Ordered   04/13/16 0113  Full code  Continuous     04/13/16 0112    Code Status History    Date Active  Date Inactive Code Status Order ID Comments User Context   04/09/2016  5:44 AM 04/09/2016  5:32 PM Full Code [id number]  Saundra Shelling, MD Inpatient   07/24/2013  2:19 PM 07/26/2013  8:38 PM Full Code [id number]  Charlie Pitter, MD Inpatient   07/20/2013  1:36 PM 07/24/2013  2:19 PM Full Code [id number]  Othella Boyer, MD Inpatient     Family Communication: Spoke with husband on the phone  Disposition Plan: Potentially home tomorrow  Consultants:  Surgery  Gastroenterology  Time spent: 25 minutes  [place], [place]

## 2016-04-14 NOTE — Progress Notes (Signed)
Pt FSBS was 62.Pt was NPO;  Given 1/2 amp of D50 per hypoglycemia protocol. Pt FSBS rechecked 15 minutes later with a reading of 125. Primary nurse to continue to monitor.

## 2016-04-14 NOTE — Progress Notes (Signed)
CC: Atypical abdominal pain Subjective: Patient currently without complaints. She has just completed her HIDA scan.  Objective: Vital signs in last 24 hours: Temp:  [98.1 F (36.7 C)-98.9 F (37.2 C)] 98.1 F (36.7 C) (09/05 1044) Pulse Rate:  [64-82] 82 (09/05 1044) Resp:  [18-20] 20 (09/05 0434) BP: (142-162)/(42-60) 156/42 (09/05 1044) SpO2:  [92 %-95 %] 95 % (09/05 1044) Last BM Date: 04/12/16  Intake/Output from previous day: 09/04 0701 - 09/05 0700 In: 809 [P.O.:240; I.V.:569] Out: 3900 [Urine:3900] Intake/Output this shift: Total I/O In: 195.6 [I.V.:195.6] Out: 200 [Urine:200]  Physical exam:  Gen.: No acute distress Chest: Clear to auscultation Heart: Regular rhythm Abdomen: Soft, nondistended, no focal abdominal pain.  Lab Results: CBC   Recent Labs  04/12/16 1553 04/13/16 0600  WBC 6.4 5.8  HGB 11.4* 9.8*  HCT 32.7* 28.9*  PLT 243 213   BMET  Recent Labs  04/12/16 1553 04/13/16 0600  NA 136 138  K 4.8 4.0  CL 102 106  CO2 26 25  GLUCOSE 119* 101*  BUN 16 13  CREATININE 1.54* 1.26*  CALCIUM 8.9 8.2*   PT/INR No results for input(s): LABPROT, INR in the last 72 hours. ABG No results for input(s): PHART, HCO3 in the last 72 hours.  Invalid input(s): PCO2, PO2  Studies/Results: Nm Hepato W/eject Fract  Result Date: 04/14/2016 CLINICAL DATA:  Pain. EXAM: NUCLEAR MEDICINE HEPATOBILIARY IMAGING WITH GALLBLADDER EF TECHNIQUE: Sequential images of the abdomen were obtained out to 60 minutes following intravenous administration of radiopharmaceutical. After oral ingestion of Ensure, gallbladder ejection fraction was determined. At 60 min, normal ejection fraction is greater than 33%. RADIOPHARMACEUTICALS:  5.3 MCi Tc-40m  Choletec IV COMPARISON:  Ultrasound 04/12/2016. FINDINGS: Prompt uptake and biliary excretion of activity by the liver is seen. Gallbladder activity is visualized, consistent with patency of cystic duct. Biliary activity passes  into small bowel, consistent with patent common bile duct. Calculated gallbladder ejection fraction is 58%. (Normal gallbladder ejection fraction with Ensure is greater than 33%.) IMPRESSION: Normal exam. Electronically Signed   By: Marcello Moores  Register   On: 04/14/2016 10:05   US Abdomen Limited Ruq  Result Date: 04/12/2016 CLINICAL DATA:  82 year old female with abdominal pain. EXAM: US ABDOMEN LIMITED - RIGHT UPPER QUADRANT COMPARISON:  CT dated 04/09/2016 FINDINGS: Gallbladder: There multiple small stones within the gallbladder. The gallbladder is mildly distended. There is no gallbladder wall thickening or pericholecystic fluid. Positive sonographic Murphy's sign. Common bile duct: Diameter: 3 mm Liver: No focal lesion identified. Within normal limits in parenchymal echogenicity. IMPRESSION: Cholelithiasis without definite sonographic evidence of acute cholecystitis. A hepatobiliary scintigraphy may provide better evaluation of the gallbladder if an acute cholecystitis is clinically suspected. Electronically Signed   By: Anner Crete M.D.   On: 04/12/2016 20:29    Anti-infectives: Anti-infectives    None      Assessment/Plan:  81 year old female with atypical abdominal pain. Normal HIDA scan today. No evidence of cholecystitis. No indications for surgical intervention currently. Recommend and agree with continued GI workup. Please call again if surgery can be of any further assistance.  Daralyn Bert T. Adonis Huguenin, MD, FACS  04/14/2016

## 2016-04-14 NOTE — Consult Note (Signed)
GI Inpatient Follow-up Note  Patient Identification: Kristen Barron is a 82 y.o. female  Subjective:  Scheduled Inpatient Medications:  . clopidogrel  75 mg Oral Q breakfast  . enoxaparin (LOVENOX) injection  40 mg Subcutaneous QHS  . gabapentin  600 mg Oral TID  . hydrALAZINE  25 mg Oral TID  . insulin aspart  0-15 Units Subcutaneous Q4H  . insulin detemir  18 Units Subcutaneous QHS  . magnesium oxide  400 mg Oral BID  . metoCLOPramide (REGLAN) injection  5 mg Intravenous TID AC & HS  . metoprolol succinate  100 mg Oral Daily  . pantoprazole  40 mg Oral BID AC  . polyethylene glycol  17 g Oral Daily  . pravastatin  80 mg Oral QPM  . torsemide  20 mg Oral BID    Continuous Inpatient Infusions:   . sodium chloride 30 mL/hr at 04/14/16 0031    PRN Inpatient Medications:  acetaminophen **OR** acetaminophen, ondansetron **OR** ondansetron (ZOFRAN) IV, oxyCODONE  Review of Systems:  Constitutional: Weight is stable.  Eyes: No changes in vision. ENT: No oral lesions, sore throat.  GI: see HPI.  Heme/Lymph: No easy bruising.  CV: No chest pain.  GU: No hematuria.  Integumentary: No rashes.  Neuro: No headaches.  Psych: No depression/anxiety.  Endocrine: No heat/cold intolerance.  Allergic/Immunologic: No urticaria.  Resp: No cough, SOB.  Musculoskeletal: No joint swelling.    Physical Examination: BP (!) 146/68 (BP Location: Left Arm)   Pulse 73   Temp 97.9 F (36.6 C) (Oral)   Resp 20   Ht 5\' 2"  (1.575 m)   Wt 92 kg (202 lb 14.4 oz)   SpO2 98%   BMI 37.11 kg/m  Gen: NAD, alert and oriented x 4 HEENT: PEERLA, EOMI, Neck: supple, no JVD or thyromegaly Chest: CTA bilaterally, no wheezes, crackles, or other adventitious sounds CV: RRR, no m/g/c/r Abd: soft, NT, ND, +BS in all four quadrants; no HSM, guarding, ridigity, or rebound tenderness Ext: no edema Skin: no rash or lesions noted   Data: Lab Results  Component Value Date   WBC 5.8 04/13/2016   HGB  9.8 (L) 04/13/2016   HCT 28.9 (L) 04/13/2016   MCV 86.3 04/13/2016   PLT 213 04/13/2016    Recent Labs Lab 04/09/16 0549 04/12/16 1553 04/13/16 0600  HGB 10.3* 11.4* 9.8*   Lab Results  Component Value Date   NA 138 04/13/2016   K 4.0 04/13/2016   CL 106 04/13/2016   CO2 25 04/13/2016   BUN 13 04/13/2016   CREATININE 1.26 (H) 04/13/2016   Lab Results  Component Value Date   ALT 11 (L) 04/13/2016   AST 18 04/13/2016   ALKPHOS 51 04/13/2016   BILITOT 0.7 04/13/2016   No results for input(s): APTT, INR, PTT in the last 168 hours.  Assessment/Plan: Ms. Afifi is a 82 y.o. female Abd minimal distended, not especially tender today.  Recommendations:Due to abd pain without a cause we can find on imaging studies I will do an EGD and colonoscopy tomorrow.  Patient in agreement.  Please call with questions or concerns.  Gaylyn Cheers, MD

## 2016-04-15 ENCOUNTER — Encounter
Admission: EM | Disposition: A | Discharge status: Home / Self Care | Payer: Self-pay | Source: Home / Self Care | Attending: Internal Medicine

## 2016-04-15 ENCOUNTER — Encounter: Payer: Self-pay | Admitting: Anesthesiology

## 2016-04-15 ENCOUNTER — Telehealth: Payer: Self-pay

## 2016-04-15 ENCOUNTER — Inpatient Hospital Stay: Payer: Medicare Other | Admitting: Anesthesiology

## 2016-04-15 HISTORY — PX: ESOPHAGOGASTRODUODENOSCOPY (EGD) WITH PROPOFOL: SHX5813

## 2016-04-15 HISTORY — PX: COLONOSCOPY WITH PROPOFOL: SHX5780

## 2016-04-15 LAB — GLUCOSE, CAPILLARY
GLUCOSE-CAPILLARY: 103 mg/dL — AB (ref 65–99)
GLUCOSE-CAPILLARY: 209 mg/dL — AB (ref 65–99)
GLUCOSE-CAPILLARY: 90 mg/dL (ref 65–99)
Glucose-Capillary: 182 mg/dL — ABNORMAL HIGH (ref 65–99)

## 2016-04-15 SURGERY — COLONOSCOPY WITH PROPOFOL
Anesthesia: General

## 2016-04-15 SURGERY — EGD (ESOPHAGOGASTRODUODENOSCOPY)
Anesthesia: General

## 2016-04-15 MED ORDER — SODIUM CHLORIDE 0.9 % IV SOLN
INTRAVENOUS | Status: DC
  Administered 2016-04-15: 1000 mL via INTRAVENOUS

## 2016-04-15 MED ORDER — LISINOPRIL 20 MG PO TABS: 20.0000 mg | ORAL_TABLET | Freq: Every day | ORAL | 0 refills | Status: DC

## 2016-04-15 MED ORDER — HYOSCYAMINE SULFATE 0.125 MG SL SUBL: 0.1250 mg | SUBLINGUAL_TABLET | Freq: Four times a day (QID) | SUBLINGUAL | 0 refills | Status: DC | PRN

## 2016-04-15 MED ORDER — POLYETHYLENE GLYCOL 3350 17 G PO PACK: 17.0000 g | PACK | Freq: Every day | ORAL | 0 refills | Status: DC

## 2016-04-15 MED ORDER — PROPOFOL 500 MG/50ML IV EMUL
INTRAVENOUS | Status: DC | PRN
  Administered 2016-04-15: 100 ug/kg/min via INTRAVENOUS

## 2016-04-15 MED ORDER — PROPOFOL 10 MG/ML IV BOLUS
INTRAVENOUS | Status: DC | PRN
  Administered 2016-04-15 (×3): 25 mg via INTRAVENOUS

## 2016-04-15 MED ORDER — LISINOPRIL 20 MG PO TABS
20.0000 mg | ORAL_TABLET | Freq: Every day | ORAL | Status: DC
  Administered 2016-04-15 – 2016-04-16 (×2): 20 mg via ORAL
  Filled 2016-04-15 (×3): qty 1

## 2016-04-15 MED ORDER — DICYCLOMINE HCL 20 MG PO TABS
20.0000 mg | ORAL_TABLET | Freq: Three times a day (TID) | ORAL | Status: DC
  Administered 2016-04-15 – 2016-04-16 (×3): 20 mg via ORAL
  Filled 2016-04-15 (×3): qty 1

## 2016-04-15 MED ORDER — SODIUM CHLORIDE 0.9 % IV SOLN: INTRAVENOUS | Status: DC

## 2016-04-15 MED ORDER — HYOSCYAMINE SULFATE 0.125 MG SL SUBL
0.1250 mg | SUBLINGUAL_TABLET | Freq: Four times a day (QID) | SUBLINGUAL | Status: DC | PRN
  Filled 2016-04-15: qty 1

## 2016-04-15 MED ORDER — DICYCLOMINE HCL 20 MG PO TABS: 20.0000 mg | ORAL_TABLET | Freq: Three times a day (TID) | ORAL | 0 refills | Status: DC

## 2016-04-15 NOTE — Consult Note (Signed)
EGD showed minimal inflammation, biopsies done.  Colonoscopy showed spasm and internal hemorrhoids.  From my stand point she can go home today or tomorrrow on PPI for stomach and Levsin for colon problems.

## 2016-04-15 NOTE — Op Note (Signed)
[facility] Gastroenterology Patient Name: Kristen Barron Procedure Date: 04/15/2016 1:09 PM MRN: [id number] Account #: [id number] Date of Birth: [birth year] Admit Type: Inpatient Age: 82 Room: [facility] ENDO ROOM 4 Gender: Female Note Status: Finalized Procedure:            Colonoscopy Indications:          Generalized abdominal pain, Abdominal pain in the left                        lower quadrant, Abdominal pain in the right upper                        quadrant Providers:            Manya Silvas, MD Referring MD:         Denton Lank MD, MD (Referring MD) Medicines:            Propofol per Anesthesia Complications:        No immediate complications. Procedure:            Pre-Anesthesia Assessment:                       - After reviewing the risks and benefits, the patient                        was deemed in satisfactory condition to undergo the                        procedure.                       After obtaining informed consent, the colonoscope was                        passed under direct vision. Throughout the procedure,                        the patient's blood pressure, pulse, and oxygen                        saturations were monitored continuously. The                        Colonoscope was introduced through the anus and                        advanced to the the cecum, identified by appendiceal                        orifice and ileocecal valve. The colonoscopy was                        somewhat difficult due to a tortuous colon. Successful                        completion of the procedure was aided by applying                        abdominal pressure. The patient tolerated the procedure  well. The quality of the bowel preparation was good. Findings:      Internal hemorrhoids were found during endoscopy. The hemorrhoids were       small and Grade I (internal hemorrhoids that do not prolapse).      There was moderate spasm  in the sigmoid colon.      The exam was otherwise without abnormality. Impression:           - Internal hemorrhoids.                       - Moderate colonic spasm consistent with irritable                        bowel syndrome.                       - The examination was otherwise normal.                       - No specimens collected. Recommendation:       - The findings and recommendations were discussed with                        the patient. Manya Silvas, MD 04/15/2016 1:41:48 PM This report has been signed electronically. Number of Addenda: 0 Note Initiated On: 04/15/2016 1:09 PM Scope Withdrawal Time: 0 hours 5 minutes 20 seconds  Total Procedure Duration: 0 hours 15 minutes 26 seconds       [facility]

## 2016-04-15 NOTE — OR Nursing (Signed)
700 cc's iv fluid [n/s] drinking po fluids tolerating well.

## 2016-04-15 NOTE — Plan of Care (Signed)
Problem: Safety: Goal: Ability to remain free from injury will improve Outcome: Progressing Pt has remained free from falls and other during my care. Pt calls appropriately to get OOB to [facility].   Problem: Pain Managment: Goal: General experience of comfort will improve Outcome: Progressing Pt has complained of leg pain 4/10 during my shift and received PRN Tylenol. Pt did complain of abdominal pain (aching) to MD during rounds.

## 2016-04-15 NOTE — Op Note (Signed)
[facility] Gastroenterology Patient Name: Kristen Barron Procedure Date: 04/15/2016 1:09 PM MRN: [id number] Account #: [id number] Date of Birth: 03/25/1929 Admit Type: Inpatient Age: 82 Room: [facility] ENDO ROOM 4 Gender: Female Note Status: Finalized Procedure:            Upper GI endoscopy Indications:          Epigastric abdominal pain Providers:            Manya Silvas, MD Referring MD:         Denton Lank MD, MD (Referring MD) Medicines:            Propofol per Anesthesia Complications:        No immediate complications. Procedure:            Pre-Anesthesia Assessment:                       - After reviewing the risks and benefits, the patient                        was deemed in satisfactory condition to undergo the                        procedure.                       After obtaining informed consent, the endoscope was                        passed under direct vision. Throughout the procedure,                        the patient's blood pressure, pulse, and oxygen                        saturations were monitored continuously. The Endoscope                        was introduced through the mouth, and advanced to the                        second part of duodenum. The upper GI endoscopy was                        accomplished without difficulty. The patient tolerated                        the procedure well. Findings:      The examined esophagus was normal.      A small hiatal hernia was present.      Diffuse and patchy mild inflammation characterized by erythema and       granularity was found in the gastric body and in the gastric antrum.       Biopsies were taken with a cold forceps for histology. Biopsies were       taken with a cold forceps for Helicobacter pylori testing.      The examined duodenum was normal. Impression:           - Normal esophagus.                       - Small hiatal hernia.                       -  Gastritis. Biopsied.                 - Normal examined duodenum. Recommendation:       - Await pathology results.                       - Perform a colonoscopy today. Manya Silvas, MD 04/15/2016 1:20:34 PM This report has been signed electronically. Number of Addenda: 0 Note Initiated On: 04/15/2016 1:09 PM      [facility]

## 2016-04-15 NOTE — Anesthesia Preprocedure Evaluation (Signed)
Anesthesia Evaluation  Patient identified by MRN, date of birth, ID band Patient awake    Reviewed: Allergy & Precautions, H&P , NPO status , Patient's Chart, lab work & pertinent test results, reviewed documented beta blocker date and time   History of Anesthesia Complications Negative for: history of anesthetic complications  Airway Mallampati: III  TM Distance: >3 FB Neck ROM: full    Dental no notable dental hx. (+) Edentulous Upper, Upper Dentures, Partial Lower, Poor Dentition   Pulmonary neg pulmonary ROS,    Pulmonary exam normal breath sounds clear to auscultation       Cardiovascular Exercise Tolerance: Good hypertension, (-) angina+CHF  (-) CAD, (-) Past MI, (-) Cardiac Stents and (-) CABG Normal cardiovascular exam(-) dysrhythmias (-) Valvular Problems/Murmurs Rhythm:regular Rate:Normal     Neuro/Psych neg Seizures CVA negative psych ROS   GI/Hepatic negative GI ROS, Neg liver ROS,   Endo/Other  diabetes, Insulin Dependent  Renal/GU CRFRenal disease  negative genitourinary   Musculoskeletal   Abdominal   Peds  Hematology  (+) Blood dyscrasia, anemia ,   Anesthesia Other Findings Past Medical History: No date: Arthritis 03/06/2014: Breast cancer ([place])     Comment: right breast with mastectomy and chemo No date: Cancer ([place])     Comment: Reports lumpectomy for a cyst No date: CHF (congestive heart failure) ([facility]) No date: Collagen vascular disease ([place]) No date: Diabetes ([place]) No date: DVT (deep venous thrombosis) ([facility]) No date: Hemorrhoids No date: Hypertension No date: Renal disorder No date: Renal insufficiency No date: Stroke [facility])     Comment: August 2014, but has had strokes prior as well   Reproductive/Obstetrics negative OB ROS                             Anesthesia Physical Anesthesia Plan  ASA: III  Anesthesia Plan: General   Post-op Pain Management:     Induction:   Airway Management Planned:   Additional Equipment:   Intra-op Plan:   Post-operative Plan:   Informed Consent: I have reviewed the patients History and Physical, chart, labs and discussed the procedure including the risks, benefits and alternatives for the proposed anesthesia with the patient or authorized representative who has indicated his/her understanding and acceptance.   Dental Advisory Given  Plan Discussed with: Anesthesiologist, CRNA and Surgeon  Anesthesia Plan Comments:         Anesthesia Quick Evaluation

## 2016-04-15 NOTE — Transfer of Care (Signed)
Immediate Anesthesia Transfer of Care Note  Patient: Kristen Barron  Procedure(s) Performed: Procedure(s): COLONOSCOPY WITH PROPOFOL (N/A) ESOPHAGOGASTRODUODENOSCOPY (EGD) WITH PROPOFOL (N/A)  Patient Location: PACU  Anesthesia Type:General  Level of Consciousness: awake, alert  and oriented  Airway & Oxygen Therapy: Patient Spontanous Breathing and Patient connected to nasal cannula oxygen  Post-op Assessment: Report given to RN and Post -op Vital signs reviewed and stable  Post vital signs: Reviewed and stable  Last Vitals:  Vitals:   04/15/16 1226 04/15/16 1253  BP: (!) 185/59 (!) 189/61  Pulse: 70 74  Resp: (!) 24 18  Temp: 36.6 C 36.7 C    Last Pain:  Vitals:   04/15/16 1226  TempSrc: Oral  PainSc:          Complications: No apparent anesthesia complications

## 2016-04-15 NOTE — Telephone Encounter (Signed)
Received a referral for Kristen Barron but she is already a patient of Dr.Naveira. After investigating I saw that he has a prn order for lesi. I called the patient but her husband told me that she is in the hospital. I asked him to let her know that she can call whenever she is ready to schedule the procedure.

## 2016-04-15 NOTE — Progress Notes (Signed)
Patient ID: Kristen Barron, female   DOB: [birth year], 82 y.o.   MRN: [id number]  [employer] PROGRESS NOTE  Kristen Barron [id number] DOB: 09-12-1928 DOA: 04/12/2016 PCP: Baltazar Apo, MD  HPI/Subjective: Patient still having abdominal pain. Abdominal pain feels like an ache in the abdomen. Has the pain for a long time now.  Objective: Vitals:   04/15/16 1008 04/15/16 1226  BP: (!) 157/55 (!) 185/59  Pulse: 76 70  Resp:  (!) 24  Temp:  97.9 F (36.6 C)    Filed Weights   04/12/16 1545 04/13/16 0123  Weight: 89.8 kg (198 lb) 92 kg (202 lb 14.4 oz)    ROS: Review of Systems  Constitutional: Negative for chills and fever.  Eyes: Negative for blurred vision.  Respiratory: Negative for cough and shortness of breath.   Cardiovascular: Negative for chest pain.  Gastrointestinal: Positive for abdominal pain. Negative for constipation, diarrhea, nausea and vomiting.  Genitourinary: Negative for dysuria.  Musculoskeletal: Negative for joint pain.  Neurological: Negative for dizziness and headaches.   Exam: Physical Exam  Constitutional: She is oriented to person, place, and time.  HENT:  Nose: No mucosal edema.  Mouth/Throat: No oropharyngeal exudate or posterior oropharyngeal edema.  Eyes: Conjunctivae, EOM and lids are normal. Pupils are equal, round, and reactive to light.  Neck: No JVD present. Carotid bruit is not present. No edema present. No thyroid mass and no thyromegaly present.  Cardiovascular: S1 normal and S2 normal.  Exam reveals no gallop.   No murmur heard. Pulses:      Dorsalis pedis pulses are 2+ on the right side, and 2+ on the left side.  Respiratory: No respiratory distress. She has no wheezes. She has no rhonchi. She has no rales.  GI: Soft. Bowel sounds are normal. There is generalized tenderness.  Musculoskeletal:       Right ankle: She exhibits swelling.       Left ankle: She exhibits swelling.  Lymphadenopathy:    She has no cervical  adenopathy.  Neurological: She is alert and oriented to person, place, and time. No cranial nerve deficit.  Skin: Skin is warm. No rash noted. Nails show no clubbing.  Psychiatric: She has a normal mood and affect.      Data Reviewed: Basic Metabolic Panel:  Recent Labs Lab 04/08/16 2117 04/09/16 0549 04/12/16 1553 04/13/16 0600  NA 133* 131* 136 138  K 4.3 4.1 4.8 4.0  CL 99* 99* 102 106  CO2 27 26 26 25   GLUCOSE 143* 131* 119* 101*  BUN 11 9 16 13   CREATININE 1.31* 1.24* 1.54* 1.26*  CALCIUM 8.7* 8.6* 8.9 8.2*  MG  --   --  2.4  --   PHOS  --   --  4.0  --    Liver Function Tests:  Recent Labs Lab 04/08/16 2117 04/12/16 1553 04/13/16 0600  AST 22 21 18   ALT 11* 13* 11*  ALKPHOS 63 61 51  BILITOT 0.7 0.4 0.7  PROT 7.1 7.6 6.2*  ALBUMIN 3.6 3.8 3.1*    Recent Labs Lab 04/08/16 2117 04/12/16 1553  LIPASE 19 14   CBC:  Recent Labs Lab 04/08/16 2117 04/09/16 0549 04/12/16 1553 04/13/16 0600  WBC 6.2 6.7 6.4 5.8  HGB 10.6* 10.3* 11.4* 9.8*  HCT 30.4* 30.1* 32.7* 28.9*  MCV 85.6 86.4 86.9 86.3  PLT 233 219 243 213   Cardiac Enzymes:  Recent Labs Lab 04/08/16 2117 04/12/16 1553  TROPONINI <0.03 <0.03  CBG:  Recent Labs Lab 04/13/16 0126 04/13/16 0302 04/13/16 2002 04/13/16 2358  GLUCAP 104* 119* 190* 83     Studies: Nm Hepato W/eject Fract  Result Date: 04/14/2016 CLINICAL DATA:  Pain. EXAM: NUCLEAR MEDICINE HEPATOBILIARY IMAGING WITH GALLBLADDER EF TECHNIQUE: Sequential images of the abdomen were obtained out to 60 minutes following intravenous administration of radiopharmaceutical. After oral ingestion of Ensure, gallbladder ejection fraction was determined. At 60 min, normal ejection fraction is greater than 33%. RADIOPHARMACEUTICALS:  5.3 MCi Tc-67m  Choletec IV COMPARISON:  Ultrasound 04/12/2016. FINDINGS: Prompt uptake and biliary excretion of activity by the liver is seen. Gallbladder activity is visualized, consistent with  patency of cystic duct. Biliary activity passes into small bowel, consistent with patent common bile duct. Calculated gallbladder ejection fraction is 58%. (Normal gallbladder ejection fraction with Ensure is greater than 33%.) IMPRESSION: Normal exam. Electronically Signed   By: Marcello Moores  Register   On: 04/14/2016 10:05    Scheduled Meds: . gabapentin  600 mg Oral TID  . hydrALAZINE  25 mg Oral TID  . insulin aspart  0-15 Units Subcutaneous Q4H  . insulin detemir  18 Units Subcutaneous QHS  . magnesium oxide  400 mg Oral BID  . metoCLOPramide (REGLAN) injection  5 mg Intravenous TID AC & HS  . metoprolol succinate  100 mg Oral Daily  . pantoprazole  40 mg Oral BID AC  . polyethylene glycol  17 g Oral Daily  . pravastatin  80 mg Oral QPM  . torsemide  20 mg Oral BID   Continuous Infusions: . sodium chloride 30 mL/hr at 04/15/16 0630  . sodium chloride    . sodium chloride      Assessment/Plan:  1. Abdominal pain with nausea vomiting. Recent CT scan of the abdomen and pelvis negative. Ultrasound of the abdomen showed gallstones. HIDA scan negative. EGD and colonoscopy likely late this afternoon. Empiric Reglan just in case diabetic gastroparesis has not made a difference in her pain. We'll see if the colonoscopy prep relieves her pain or not. Depending on how she feels after the procedure when the procedures done can consider disposition later today or tomorrow 2. Acute kidney injury on chronic kidney disease continue gentle IV fluid hydration 3. Type 2 diabetes mellitus. On Levemir and insulin sliding scale 4. Essential hypertension on hydralazine 5. Hyperlipidemia unspecified on pravastatin 6. History of stroke on Plavix 7. No signs of heart failure currently  Code Status:     Code Status Orders        Start     Ordered   04/13/16 0113  Full code  Continuous     04/13/16 0112    Code Status History    Date Active Date Inactive Code Status Order ID Comments User Context    04/09/2016  5:44 AM 04/09/2016  5:32 PM Full Code [id number]  Saundra Shelling, MD Inpatient   07/24/2013  2:19 PM 07/26/2013  8:38 PM Full Code [id number]  Charlie Pitter, MD Inpatient   07/20/2013  1:36 PM 07/24/2013  2:19 PM Full Code [id number]  Othella Boyer, MD Inpatient     Family Communication: Spoke with husband on the phone yesterday Disposition Plan: Home soon  Consultants:  Surgery  Gastroenterology  Time spent: 24 minutes  Loletha Grayer  [employer]

## 2016-04-16 LAB — GLUCOSE, CAPILLARY
GLUCOSE-CAPILLARY: 195 mg/dL — AB (ref 65–99)
GLUCOSE-CAPILLARY: 93 mg/dL (ref 65–99)
Glucose-Capillary: 87 mg/dL (ref 65–99)
Glucose-Capillary: 97 mg/dL (ref 65–99)
Glucose-Capillary: 159 mg/dL — ABNORMAL HIGH (ref 65–99)

## 2016-04-16 LAB — BASIC METABOLIC PANEL
ANION GAP: 6 (ref 5–15)
BUN: 13 mg/dL (ref 6–20)
CALCIUM: 8.2 mg/dL — AB (ref 8.9–10.3)
CHLORIDE: 101 mmol/L (ref 101–111)
CO2: 32 mmol/L (ref 22–32)
Creatinine, Ser: 1.25 mg/dL — ABNORMAL HIGH (ref 0.44–1.00)
GFR calc non Af Amer: 38 mL/min — ABNORMAL LOW (ref >60)
GFR, EST AFRICAN AMERICAN: 44 mL/min — AB (ref >60)
Glucose, Bld: 99 mg/dL (ref 65–99)
Potassium: 3.2 mmol/L — ABNORMAL LOW (ref 3.5–5.1)
Sodium: 139 mmol/L (ref 135–145)

## 2016-04-16 LAB — SURGICAL PATHOLOGY

## 2016-04-16 MED ORDER — POTASSIUM CHLORIDE CRYS ER 20 MEQ PO TBCR
40.0000 meq | EXTENDED_RELEASE_TABLET | Freq: Once | ORAL | Status: AC
  Administered 2016-04-16: 40 meq via ORAL
  Filled 2016-04-16: qty 2

## 2016-04-16 MED ORDER — POTASSIUM CHLORIDE CRYS ER 20 MEQ PO TBCR: 40.0000 meq | EXTENDED_RELEASE_TABLET | Freq: Every day | ORAL | 1 refills | Status: DC

## 2016-04-16 MED ORDER — POTASSIUM CHLORIDE CRYS ER 20 MEQ PO TBCR: 20.0000 meq | EXTENDED_RELEASE_TABLET | Freq: Every day | ORAL | 1 refills | Status: DC

## 2016-04-16 NOTE — Progress Notes (Signed)
Patient discharged to home. Concerns addressed. Ride present in room and IV site removed.

## 2016-04-16 NOTE — Discharge Summary (Signed)
[facility] [place] NAME: Kristen Barron    MR#:  [id number]  DATE OF BIRTH:  01-Jan-1929  DATE OF ADMISSION:  04/12/2016   ADMITTING PHYSICIAN: Harvie Bridge, DO  DATE OF DISCHARGE: 04/16/2016  PRIMARY CARE PHYSICIAN: Baltazar Apo, MD   ADMISSION DIAGNOSIS:   Nausea [R11.0] Abdominal pain [R10.9]  DISCHARGE DIAGNOSIS:   Active Problems:   Abdominal pain   SECONDARY DIAGNOSIS:   Past Medical History:  Diagnosis Date  . Arthritis   . Breast cancer ([place]) 03/06/2014   right breast with mastectomy and chemo  . Cancer [facility])    Reports lumpectomy for a cyst  . CHF (congestive heart failure) ([place])   . Collagen vascular disease ([place])   . Diabetes ([place])   . DVT (deep venous thrombosis) ([place])   . Hemorrhoids   . Hypertension   . Renal disorder   . Renal insufficiency   . Stroke [facility])    August 2014, but has had strokes prior as well    HOSPITAL COURSE:   82 year old female with past medical history significant for diabetes, hypertension, CK D, CVA, CHF presents to the hospital secondary to worsening lower abdominal pain.  #1 abdominal pain with nausea vomiting-likely colon spasms and also gastritis. -Appreciate GI consult. CT of the abdomen showing no evidence of acute cholecystitis but she has gallstones. HIDA scan is negative . She has diverticulosis without diverticulitis. -EGD showing mild gastritis. -Colonoscopy showing colonic spasms consistent with irritable bowel syndrome. -Started on Bentyl and also hyoscyamine. Symptoms are much improved today. Patient is able to tolerate diet without any discomfort.  #2 diabetes mellitus-continue Levemir and also aspart insulin.  #3 hypertension-patient is on Toprol, hydralazine, lisinopril and torsemide.  #4 history of CVA-on Plavix and statin. Stable at this time.  #5 acute kidney injury on top of CK D-received IV fluids. Much improved.  Patient is being discharged  today   DISCHARGE CONDITIONS:   Stable CONSULTS OBTAINED:    1. GI consultation by Dr. Gaylyn Cheers  DRUG ALLERGIES:   No Known Allergies DISCHARGE MEDICATIONS:     Medication List    TAKE these medications   acetaminophen 500 MG tablet Commonly known as:  TYLENOL Take 500 mg by mouth every 6 (six) hours as needed.   clopidogrel 75 MG tablet Commonly known as:  PLAVIX Take 75 mg by mouth daily with breakfast.   dicyclomine 20 MG tablet Commonly known as:  BENTYL Take 1 tablet (20 mg total) by mouth 4 (four) times daily -  before meals and at bedtime.   gabapentin 300 MG capsule Commonly known as:  NEURONTIN Take 600 mg by mouth 3 (three) times daily.   GLUCOSAMINE 1500 COMPLEX PO Take by mouth 3 (three) times daily.   hydrALAZINE 25 MG tablet Commonly known as:  APRESOLINE Take 25 mg by mouth 3 (three) times daily.   HYDROcodone-acetaminophen 5-325 MG tablet Commonly known as:  NORCO/VICODIN Take 1 tablet by mouth every 6 (six) hours as needed.   hyoscyamine 0.125 MG SL tablet Commonly known as:  LEVSIN SL Place 1 tablet (0.125 mg total) under the tongue every 6 (six) hours as needed for cramping.   insulin aspart 100 UNIT/ML injection Commonly known as:  novoLOG Inject 8 Units into the skin 3 (three) times daily before meals.   insulin detemir 100 UNIT/ML injection Commonly known as:  LEVEMIR Inject 18 Units into the skin at bedtime.   lisinopril 20 MG tablet  Commonly known as:  PRINIVIL,ZESTRIL Take 1 tablet (20 mg total) by mouth daily.   magnesium oxide 400 MG tablet Commonly known as:  MAG-OX Take 400 mg by mouth 2 (two) times daily.   Melatonin 5 MG Tabs Take 5 mg by mouth at bedtime.   metoprolol succinate 100 MG 24 hr tablet Commonly known as:  TOPROL-XL Take 100 mg by mouth daily. Take with or immediately following a meal.   Omega 3 1200 MG Caps Take by mouth 2 (two) times daily.   ondansetron 4 MG tablet Commonly known as:   ZOFRAN Take 4 mg by mouth every 8 (eight) hours as needed for nausea or vomiting.   pantoprazole 40 MG tablet Commonly known as:  PROTONIX Take 1 tablet (40 mg total) by mouth 2 (two) times daily before a meal.   polyethylene glycol packet Commonly known as:  MIRALAX / GLYCOLAX Take 17 g by mouth daily.   potassium chloride SA 20 MEQ tablet Commonly known as:  K-DUR,KLOR-CON Take 1 tablet (20 mEq total) by mouth daily.   pravastatin 80 MG tablet Commonly known as:  PRAVACHOL Take 80 mg by mouth every evening.   torsemide 20 MG tablet Commonly known as:  DEMADEX Take 20 mg by mouth 2 (two) times daily.   traMADol 50 MG tablet Commonly known as:  ULTRAM Take 1 tablet (50 mg total) by mouth every 12 (twelve) hours as needed for severe pain.        DISCHARGE INSTRUCTIONS:   1. PCP follow-up in 2 weeks 2. GI follow up in 2-3 weeks  DIET:   Cardiac diet  ACTIVITY:   Activity as tolerated  OXYGEN:   Home Oxygen: No.  Oxygen Delivery: room air  DISCHARGE LOCATION:   home   If you experience worsening of your admission symptoms, develop shortness of breath, life threatening emergency, suicidal or homicidal thoughts you must seek medical attention immediately by calling 911 or calling your MD immediately  if symptoms less severe.  You Must read complete instructions/literature along with all the possible adverse reactions/side effects for all the Medicines you take and that have been prescribed to you. Take any new Medicines after you have completely understood and accpet all the possible adverse reactions/side effects.   Please note  You were cared for by a hospitalist during your hospital stay. If you have any questions about your discharge medications or the care you received while you were in the hospital after you are discharged, you can call the unit and asked to speak with the hospitalist on call if the hospitalist that took care of you is not available. Once  you are discharged, your primary care physician will handle any further medical issues. Please note that NO REFILLS for any discharge medications will be authorized once you are discharged, as it is imperative that you return to your primary care physician (or establish a relationship with a primary care physician if you do not have one) for your aftercare needs so that they can reassess your need for medications and monitor your lab values.    On the day of Discharge:  VITAL SIGNS:   Blood pressure (!) 142/54, pulse 73, temperature 98.4 F (36.9 C), temperature source Oral, resp. rate 20, height 5\' 2"  (1.575 m), weight 92 kg (202 lb 14.4 oz), SpO2 91 %.  PHYSICAL EXAMINATION:    GENERAL:  82 y.o.-year-old Elderly patient lying in the bed with no acute distress.  EYES: Pupils equal, round, reactive to  light and accommodation. No scleral icterus. Extraocular muscles intact.  HEENT: Head atraumatic, normocephalic. Oropharynx and nasopharynx clear.  NECK:  Supple, no jugular venous distention. No thyroid enlargement, no tenderness.  LUNGS: Normal breath sounds bilaterally, no wheezing, rales,rhonchi or crepitation. No use of accessory muscles of respiration.  CARDIOVASCULAR: S1, S2 normal. No  rubs, or gallops. 3/6 systolic murmur is present. ABDOMEN: Soft, non-tender, non-distended. Bowel sounds present. No organomegaly or mass.  EXTREMITIES: No pedal edema, cyanosis, or clubbing.  NEUROLOGIC: Cranial nerves II through XII are intact. Muscle strength 5/5 in all extremities. Sensation intact. Gait not checked.  PSYCHIATRIC: The patient is alert and oriented x 3.  SKIN: No obvious rash, lesion, or ulcer.   DATA REVIEW:   CBC  Recent Labs Lab 04/13/16 0600  WBC 5.8  HGB 9.8*  HCT 28.9*  PLT 213    Chemistries   Recent Labs Lab 04/12/16 1553 04/13/16 0600 04/16/16 0358  NA 136 138 139  K 4.8 4.0 3.2*  CL 102 106 101  CO2 26 25 32  GLUCOSE 119* 101* 99  BUN 16 13 13    CREATININE 1.54* 1.26* 1.25*  CALCIUM 8.9 8.2* 8.2*  MG 2.4  --   --   AST 21 18  --   ALT 13* 11*  --   ALKPHOS 61 51  --   BILITOT 0.4 0.7  --      Microbiology Results  Results for orders placed or performed in visit on 12/13/14  Urine culture     Status: None   Collection Time: 12/13/14 11:26 AM  Result Value Ref Range Status   Specimen Description URINE, CLEAN CATCH  Final   Special Requests NONE  Final   Report Status 12/22/2014 FINAL  Final    RADIOLOGY:  No results found.   Management plans discussed with the patient, family and they are in agreement.  CODE STATUS:     Code Status Orders        Start     Ordered   04/13/16 0113  Full code  Continuous     04/13/16 0112    Code Status History    Date Active Date Inactive Code Status Order ID Comments User Context   04/09/2016  5:44 AM 04/09/2016  5:32 PM Full Code [id number]  Saundra Shelling, MD Inpatient   07/24/2013  2:19 PM 07/26/2013  8:38 PM Full Code [id number]  Charlie Pitter, MD Inpatient   07/20/2013  1:36 PM 07/24/2013  2:19 PM Full Code [id number]  Othella Boyer, MD Inpatient      TOTAL TIME TAKING CARE OF THIS PATIENT: 38 minutes.    Gladstone Lighter M.D on 04/16/2016 at 12:04 PM  Between 7am to 6pm - Pager - [phone]  After 6pm go to [email or web address] - Proofreader  [facility]  Office  [phone]  CC: Primary care physician; Baltazar Apo, MD   Note: This dictation was prepared with Dragon dictation along with smaller phrase technology. Any transcriptional errors that result from this process are unintentional.

## 2016-04-16 NOTE — Plan of Care (Signed)
Problem: Pain Managment: Goal: General experience of comfort will improve Outcome: Adequate for Discharge No PRN pain meds last 17 hours. Tolerating heart healthy/Carb. Mod. Diet and tolerating po fluids.

## 2016-04-16 NOTE — Care Management Important Message (Signed)
Important Message  Patient Details  Name: Kristen Barron MRN: [id number] Date of Birth: 07/20/1929   Medicare Important Message Given:  Yes    Beverly Sessions, RN 04/16/2016, 10:31 AM

## 2016-04-16 NOTE — Care Management (Signed)
Patient to discharge home today.  Patient has declined any type of home health services.  Nursing ambulated patient today, and states that patient did well ambulating with a walker.  Patient states that she has a RW in the home from a previous admission.  No RNCM needs identified.

## 2016-04-16 NOTE — Anesthesia Postprocedure Evaluation (Signed)
Anesthesia Post Note  Patient: Kristen Barron  Procedure(s) Performed: Procedure(s) (LRB): COLONOSCOPY WITH PROPOFOL (N/A) ESOPHAGOGASTRODUODENOSCOPY (EGD) WITH PROPOFOL (N/A)  Patient location during evaluation: Endoscopy Anesthesia Type: General Level of consciousness: awake and alert Pain management: pain level controlled Vital Signs Assessment: post-procedure vital signs reviewed and stable Respiratory status: spontaneous breathing, nonlabored ventilation, respiratory function stable and patient connected to nasal cannula oxygen Cardiovascular status: blood pressure returned to baseline and stable Postop Assessment: no signs of nausea or vomiting Anesthetic complications: no    Last Vitals:  Vitals:   04/16/16 0549 04/16/16 1058  BP: (!) 175/60 (!) 142/54  Pulse: 73   Resp: 20   Temp: 36.9 C     Last Pain:  Vitals:   04/16/16 0751  TempSrc:   PainSc: 4                  Martha Clan

## 2016-04-17 ENCOUNTER — Encounter: Payer: Self-pay | Admitting: Unknown Physician Specialty

## 2016-05-04 ENCOUNTER — Ambulatory Visit: Payer: Medicare Other | Admitting: Pain Medicine

## 2016-09-22 NOTE — Progress Notes (Signed)
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  INTERVAL HISTORY:   Patient returns to clinic today for routine 6 month evaluation. She currently feels well and is asymptomatic. She has chronic knee pain that is unchanged. She has no neurologic complaints. She denies any other pain. She has a good appetite and denies weight loss. She has no chest pain or shortness of breath. She denies any nausea, vomiting, constipation, or diarrhea. She has no urinary complaints. Patient offers no further specific complaints today.   REVIEW OF SYSTEMS:   Review of Systems  Constitutional: Negative.  Negative for fever, malaise/fatigue and weight loss.  Respiratory: Negative.  Negative for cough and shortness of breath.   Cardiovascular: Negative.  Negative for chest pain.  Gastrointestinal: Negative.  Negative for abdominal pain.  Genitourinary: Negative.   Musculoskeletal: Positive for back pain and joint pain.  Neurological: Negative.  Negative for weakness.  Psychiatric/Behavioral: Negative.     As per HPI. Otherwise, a complete review of systems is negative.  ONCOLOGY HISTORY: Oncology History   1. Carcinoma of the right breast T1c N0 M0 tumor based on ultrasound and the breast mammogram. ER negative, PR negative, HER-2/neu positive (diagnosis in July of 2015). 2. Iron deficiency anemia with chronic renal disease. 3. Status post mastectomy (right) in August of 2015. Patient has stage IC, ER negative, PR negative, HER-2 receptor positive.  4. Patient started on chemotherapy with Taxol and Herceptin on April 09, 2014. 5. Because of progressive side effect Taxol was discontinued (August 28, 2014) Herceptin would be continued to July of 2016 6.  Because of swelling of lower extremity and shortness of breath Herceptin was discontinued in March of 2016.     Breast cancer of lower-outer quadrant of right female breast ([place])   02/28/2014 Initial Diagnosis    Breast cancer, right, invasive ductal       PAST MEDICAL HISTORY: Past Medical History:  Diagnosis Date  . Arthritis   . Breast cancer ([place]) 03/06/2014   right breast with mastectomy and chemo  . Cancer [facility])    Reports lumpectomy for a cyst  . CHF (congestive heart failure) ([place])   . Collagen vascular disease ([place])   . Diabetes ([place])   . DVT (deep venous thrombosis) ([place])   . Hemorrhoids   . Hypertension   . Renal disorder   . Renal insufficiency   . Stroke [facility])    August 2014, but has had strokes prior as well    PAST SURGICAL HISTORY: Past Surgical History:  Procedure Laterality Date  . ABDOMINAL HYSTERECTOMY     Patient not clear as to why  . BACK SURGERY    . BREAST BIOPSY Right February 15 2014   invasive mammary carcinoma/ER/PR negative, HER 2 positive  . BREAST BIOPSY Right 1999   negative biopsy  . BREAST SURGERY Right 03/06/14   mastectomy  . COLONOSCOPY WITH PROPOFOL N/A 04/15/2016   Procedure: COLONOSCOPY WITH PROPOFOL;  Surgeon: Manya Silvas, MD;  Location: [facility] ENDOSCOPY;  Service: Endoscopy;  Laterality: N/A;  . ESOPHAGOGASTRODUODENOSCOPY (EGD) WITH PROPOFOL N/A 04/15/2016   Procedure: ESOPHAGOGASTRODUODENOSCOPY (EGD) WITH PROPOFOL;  Surgeon: Manya Silvas, MD;  Location: [facility] ENDOSCOPY;  Service: Endoscopy;  Laterality: N/A;  . HAND SURGERY Right    Carpel tunnel release in the 1970s  . LUMBAR LAMINECTOMY/DECOMPRESSION MICRODISCECTOMY Bilateral 07/24/2013   Procedure: LUMBAR LAMINECTOMY/DECOMPRESSION MICRODISCECTOMY LUMBAR THREE-FOUR;  Surgeon: Charlie Pitter, MD;  Location: [facility] NEURO ORS;  Service: Neurosurgery;   Laterality: Bilateral;  . MASTECTOMY Right 03/06/2014   right br ca    FAMILY HISTORY Family History  Problem Relation Age of Onset  . Cancer Sister 58    breast  . Breast cancer Sister 41  . Cancer Brother     kidney  . Cancer Maternal Aunt     breast  . Breast cancer Maternal Aunt 70  . Cancer Other     maternal niece with breast cancer  . Breast cancer Other   . Cancer Sister     pancreatic    GYNECOLOGIC HISTORY:  No LMP recorded. Patient has had a hysterectomy.     ADVANCED DIRECTIVES:    HEALTH MAINTENANCE: Social History  Substance Use Topics  . Smoking status: Never Smoker  . Smokeless tobacco: Never Used  . Alcohol use No    No Known Allergies  Current Outpatient Prescriptions  Medication Sig Dispense Refill  . acetaminophen (TYLENOL) 500 MG tablet Take 500 mg by mouth every 6 (six) hours as needed.    . clopidogrel (PLAVIX) 75 MG tablet Take 75 mg by mouth daily with breakfast.    . dicyclomine (BENTYL) 20 MG tablet Take 1 tablet (20 mg total) by mouth 4 (four) times daily -  before meals and at bedtime. 120 tablet 0  . gabapentin (NEURONTIN) 300 MG capsule Take 600 mg by mouth 3 (three) times daily.     . Glucosamine-Chondroit-Vit C-Mn (GLUCOSAMINE 1500 COMPLEX PO) Take by mouth 3 (three) times daily.    . hydrALAZINE (APRESOLINE) 25 MG tablet Take 25 mg by mouth 3 (three) times daily.    Marland Kitchen HYDROcodone-acetaminophen (NORCO/VICODIN) 5-325 MG per tablet Take 1 tablet by mouth every 6 (six) hours as needed.     . hyoscyamine (LEVSIN SL) 0.125 MG SL tablet Place 1 tablet (0.125 mg total) under the tongue every 6 (six) hours as needed for cramping. 30 tablet 0  . insulin aspart (NOVOLOG) 100 UNIT/ML injection Inject 8 Units into the skin 3 (three) times daily before meals.    . insulin detemir (LEVEMIR) 100 UNIT/ML injection Inject 18 Units into the skin at bedtime.    Marland Kitchen lisinopril (PRINIVIL,ZESTRIL) 20 MG tablet Take 1 tablet (20 mg total) by mouth daily. 30  tablet 0  . magnesium oxide (MAG-OX) 400 MG tablet Take 400 mg by mouth 2 (two) times daily.    . Melatonin 5 MG TABS Take 5 mg by mouth at bedtime.    . metoprolol succinate (TOPROL-XL) 100 MG 24 hr tablet Take 100 mg by mouth daily. Take with or immediately following a meal.    . Omega 3 1200 MG CAPS Take by mouth 2 (two) times daily.    . ondansetron (ZOFRAN) 4 MG tablet Take 4 mg by mouth every 8 (eight) hours as needed for nausea or vomiting.    . pantoprazole (PROTONIX) 40 MG tablet Take 1 tablet (40 mg total) by mouth 2 (two) times daily before a meal. 60 tablet 0  . polyethylene glycol (MIRALAX / GLYCOLAX) packet Take 17 g by mouth daily. 30 each 0  . potassium chloride SA (K-DUR,KLOR-CON) 20 MEQ tablet Take 1  tablet (20 mEq total) by mouth daily. 30 tablet 1  . pravastatin (PRAVACHOL) 80 MG tablet Take 80 mg by mouth every evening.     . torsemide (DEMADEX) 20 MG tablet Take 20 mg by mouth 2 (two) times daily.    . traMADol (ULTRAM) 50 MG tablet Take 1 tablet (50 mg total) by mouth every 12 (twelve) hours as needed for severe pain. 15 tablet 0   No current facility-administered medications for this visit.    Facility-Administered Medications Ordered in Other Visits  Medication Dose Route Frequency Provider Last Rate Last Dose  . sodium chloride flush (NS) 0.9 % injection 10 mL  10 mL Intravenous PRN Lloyd Huger, MD   10 mL at 09/23/16 1103    OBJECTIVE: BP 136/67 (BP Location: Left Arm, Patient Position: Sitting)   Pulse 92   Temp 98.7 F (37.1 C) (Tympanic)   Wt 195 lb 8.8 oz (88.7 kg)   BMI 35.77 kg/m    Body mass index is 35.77 kg/m.    ECOG FS:2 - Symptomatic, <50% confined to bed  General: Well-developed, well-nourished, no acute distress. Using walker for ambulation aid. Eyes: Pink conjunctiva, anicteric sclera. Lungs: Clear to auscultation bilaterally. Heart: Regular rate and rhythm. 3/6 systolic murmur present. Abdomen: Soft, nontender, nondistended. No  organomegaly noted, normoactive bowel sounds. Breast: Right chest wall and axilla without lumps or masses.  Musculoskeletal: 2+ lower extremity bilateral edema, no cyanosis or clubbing. Neuro: Alert, answering all questions appropriately. Cranial nerves grossly intact. Skin: No rashes or petechiae noted. Psych: Normal affect.   LAB RESULTS:  Appointment on 09/23/2016  Component Date Value Ref Range Status  . WBC 09/23/2016 6.5  3.6 - 11.0 K/uL Final  . RBC 09/23/2016 3.64* 3.80 - 5.20 MIL/uL Final  . Hemoglobin 09/23/2016 11.0* 12.0 - 16.0 g/dL Final  . HCT 09/23/2016 32.7* 35.0 - 47.0 % Final  . MCV 09/23/2016 89.9  80.0 - 100.0 fL Final  . MCH 09/23/2016 30.3  26.0 - 34.0 pg Final  . MCHC 09/23/2016 33.7  32.0 - 36.0 g/dL Final  . RDW 09/23/2016 13.8  11.5 - 14.5 % Final  . Platelets 09/23/2016 265  150 - 440 K/uL Final  . Neutrophils Relative % 09/23/2016 70  % Final  . Neutro Abs 09/23/2016 4.6  1.4 - 6.5 K/uL Final  . Lymphocytes Relative 09/23/2016 18  % Final  . Lymphs Abs 09/23/2016 1.1  1.0 - 3.6 K/uL Final  . Monocytes Relative 09/23/2016 8  % Final  . Monocytes Absolute 09/23/2016 0.5  0.2 - 0.9 K/uL Final  . Eosinophils Relative 09/23/2016 3  % Final  . Eosinophils Absolute 09/23/2016 0.2  0 - 0.7 K/uL Final  . Basophils Relative 09/23/2016 1  % Final  . Basophils Absolute 09/23/2016 0.1  0 - 0.1 K/uL Final    STUDIES: No results found.  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  PLAN:    1. Stage Ia ER/PR negative, HER-2 positive adenoarcinoma of the lower outer quadrant of the right breast:  No evidence of recurrent disease. Patient is status post right mastectomy on March 06, 2014.  Patient also received Taxol and Herceptin between August 2015 and to January 2016. Taxol was discontinued secondary to side effects. Herceptin was also subsequently discontinued in March 2016. Patient's  most recent mammogram on February 19, 2016 was reported as BI-RADS 1, repeat in July 2018. She does not  require aromatase inhibitor given the ER/PR status of her tumor.  Return to clinic in 6 months with repeat laboratory work and further evaluation. 2. Joint pain: Chronic and unchanged. Continue treatment per primary care. 3. Anemia: Mild, monitor.  Patient expressed understanding and was in agreement with this plan. She also understands that She can call clinic at any time with any questions, concerns, or complaints.    Breast cancer, right, invasive ductal   Staging form: Breast, AJCC 7th Edition     Clinical: Stage IA (T1c, N0, M0) - Unsigned   Lloyd Huger, MD   09/25/2016 2:05 PM

## 2016-09-23 ENCOUNTER — Inpatient Hospital Stay: Payer: Medicare HMO

## 2016-09-23 ENCOUNTER — Inpatient Hospital Stay: Payer: Medicare HMO | Attending: Oncology | Admitting: Oncology

## 2016-09-23 VITALS — BP 136/67 | HR 92 | Temp 98.7°F | Wt 195.5 lb

## 2016-09-23 DIAGNOSIS — I130 Hypertensive heart and chronic kidney disease with heart failure and stage 1 through stage 4 chronic kidney disease, or unspecified chronic kidney disease: Secondary | ICD-10-CM | POA: Insufficient documentation

## 2016-09-23 DIAGNOSIS — Z794 Long term (current) use of insulin: Secondary | ICD-10-CM | POA: Insufficient documentation

## 2016-09-23 DIAGNOSIS — Z95828 Presence of other vascular implants and grafts: Secondary | ICD-10-CM

## 2016-09-23 DIAGNOSIS — Z800 Family history of malignant neoplasm of digestive organs: Secondary | ICD-10-CM | POA: Insufficient documentation

## 2016-09-23 DIAGNOSIS — Z8051 Family history of malignant neoplasm of kidney: Secondary | ICD-10-CM | POA: Insufficient documentation

## 2016-09-23 DIAGNOSIS — Z171 Estrogen receptor negative status [ER-]: Secondary | ICD-10-CM | POA: Diagnosis not present

## 2016-09-23 DIAGNOSIS — C50511 Malignant neoplasm of lower-outer quadrant of right female breast: Secondary | ICD-10-CM

## 2016-09-23 DIAGNOSIS — D509 Iron deficiency anemia, unspecified: Secondary | ICD-10-CM | POA: Insufficient documentation

## 2016-09-23 DIAGNOSIS — Z9221 Personal history of antineoplastic chemotherapy: Secondary | ICD-10-CM | POA: Diagnosis not present

## 2016-09-23 DIAGNOSIS — M549 Dorsalgia, unspecified: Secondary | ICD-10-CM | POA: Insufficient documentation

## 2016-09-23 DIAGNOSIS — Z7902 Long term (current) use of antithrombotics/antiplatelets: Secondary | ICD-10-CM | POA: Insufficient documentation

## 2016-09-23 DIAGNOSIS — Z86718 Personal history of other venous thrombosis and embolism: Secondary | ICD-10-CM | POA: Diagnosis not present

## 2016-09-23 DIAGNOSIS — Z853 Personal history of malignant neoplasm of breast: Secondary | ICD-10-CM | POA: Diagnosis not present

## 2016-09-23 DIAGNOSIS — N189 Chronic kidney disease, unspecified: Secondary | ICD-10-CM | POA: Insufficient documentation

## 2016-09-23 DIAGNOSIS — Z9011 Acquired absence of right breast and nipple: Secondary | ICD-10-CM | POA: Insufficient documentation

## 2016-09-23 DIAGNOSIS — Z79899 Other long term (current) drug therapy: Secondary | ICD-10-CM | POA: Diagnosis not present

## 2016-09-23 DIAGNOSIS — Z8673 Personal history of transient ischemic attack (TIA), and cerebral infarction without residual deficits: Secondary | ICD-10-CM | POA: Insufficient documentation

## 2016-09-23 DIAGNOSIS — M25569 Pain in unspecified knee: Secondary | ICD-10-CM | POA: Insufficient documentation

## 2016-09-23 DIAGNOSIS — M1990 Unspecified osteoarthritis, unspecified site: Secondary | ICD-10-CM | POA: Insufficient documentation

## 2016-09-23 DIAGNOSIS — Z803 Family history of malignant neoplasm of breast: Secondary | ICD-10-CM | POA: Insufficient documentation

## 2016-09-23 DIAGNOSIS — G8929 Other chronic pain: Secondary | ICD-10-CM | POA: Diagnosis not present

## 2016-09-23 DIAGNOSIS — E1122 Type 2 diabetes mellitus with diabetic chronic kidney disease: Secondary | ICD-10-CM | POA: Insufficient documentation

## 2016-09-23 DIAGNOSIS — D631 Anemia in chronic kidney disease: Secondary | ICD-10-CM | POA: Insufficient documentation

## 2016-09-23 LAB — CBC WITH DIFFERENTIAL/PLATELET
BASOS PCT: 1 %
Basophils Absolute: 0.1 10*3/uL (ref 0–0.1)
EOS PCT: 3 %
Eosinophils Absolute: 0.2 10*3/uL (ref 0–0.7)
HCT: 32.7 % — ABNORMAL LOW (ref 35.0–47.0)
Hemoglobin: 11 g/dL — ABNORMAL LOW (ref 12.0–16.0)
Lymphocytes Relative: 18 %
Lymphs Abs: 1.1 10*3/uL (ref 1.0–3.6)
MCH: 30.3 pg (ref 26.0–34.0)
MCHC: 33.7 g/dL (ref 32.0–36.0)
MCV: 89.9 fL (ref 80.0–100.0)
MONO ABS: 0.5 10*3/uL (ref 0.2–0.9)
MONOS PCT: 8 %
Neutro Abs: 4.6 10*3/uL (ref 1.4–6.5)
Neutrophils Relative %: 70 %
Platelets: 265 10*3/uL (ref 150–440)
RBC: 3.64 MIL/uL — ABNORMAL LOW (ref 3.80–5.20)
RDW: 13.8 % (ref 11.5–14.5)
WBC: 6.5 10*3/uL (ref 3.6–11.0)

## 2016-09-23 MED ORDER — SODIUM CHLORIDE 0.9% FLUSH
10.0000 mL | INTRAVENOUS | Status: AC | PRN
  Administered 2016-09-23: 10 mL via INTRAVENOUS
  Filled 2016-09-23: qty 10

## 2016-09-23 MED ORDER — HEPARIN SOD (PORK) LOCK FLUSH 100 UNIT/ML IV SOLN
500.0000 [IU] | Freq: Once | INTRAVENOUS | Status: AC
  Administered 2016-09-23: 500 [IU] via INTRAVENOUS

## 2016-09-23 NOTE — Progress Notes (Signed)
Patient denies pain or discomfort at this time.  Vitals stable and documented  

## 2016-11-23 ENCOUNTER — Emergency Department: Payer: MEDICARE

## 2016-11-23 ENCOUNTER — Encounter: Payer: Self-pay | Admitting: *Deleted

## 2016-11-23 ENCOUNTER — Inpatient Hospital Stay: Payer: MEDICARE

## 2016-11-23 ENCOUNTER — Inpatient Hospital Stay
Admission: EM | Admit: 2016-11-23 | Discharge: 2016-11-25 | DRG: 641 | Disposition: A | Discharge status: Home / Self Care | Payer: MEDICARE | Attending: Internal Medicine | Admitting: Internal Medicine

## 2016-11-23 DIAGNOSIS — E119 Type 2 diabetes mellitus without complications: Secondary | ICD-10-CM | POA: Diagnosis not present

## 2016-11-23 DIAGNOSIS — I110 Hypertensive heart disease with heart failure: Secondary | ICD-10-CM | POA: Diagnosis present

## 2016-11-23 DIAGNOSIS — R109 Unspecified abdominal pain: Secondary | ICD-10-CM

## 2016-11-23 DIAGNOSIS — K582 Mixed irritable bowel syndrome: Secondary | ICD-10-CM | POA: Diagnosis not present

## 2016-11-23 DIAGNOSIS — M1990 Unspecified osteoarthritis, unspecified site: Secondary | ICD-10-CM | POA: Diagnosis present

## 2016-11-23 DIAGNOSIS — N179 Acute kidney failure, unspecified: Secondary | ICD-10-CM | POA: Diagnosis present

## 2016-11-23 DIAGNOSIS — Z794 Long term (current) use of insulin: Secondary | ICD-10-CM | POA: Diagnosis not present

## 2016-11-23 DIAGNOSIS — H532 Diplopia: Secondary | ICD-10-CM | POA: Diagnosis not present

## 2016-11-23 DIAGNOSIS — I639 Cerebral infarction, unspecified: Secondary | ICD-10-CM

## 2016-11-23 DIAGNOSIS — R1011 Right upper quadrant pain: Secondary | ICD-10-CM | POA: Diagnosis not present

## 2016-11-23 DIAGNOSIS — R197 Diarrhea, unspecified: Secondary | ICD-10-CM

## 2016-11-23 DIAGNOSIS — I5032 Chronic diastolic (congestive) heart failure: Secondary | ICD-10-CM | POA: Diagnosis not present

## 2016-11-23 DIAGNOSIS — R112 Nausea with vomiting, unspecified: Secondary | ICD-10-CM

## 2016-11-23 DIAGNOSIS — E785 Hyperlipidemia, unspecified: Secondary | ICD-10-CM | POA: Diagnosis not present

## 2016-11-23 DIAGNOSIS — Z8673 Personal history of transient ischemic attack (TIA), and cerebral infarction without residual deficits: Secondary | ICD-10-CM | POA: Diagnosis not present

## 2016-11-23 DIAGNOSIS — Z7902 Long term (current) use of antithrombotics/antiplatelets: Secondary | ICD-10-CM

## 2016-11-23 DIAGNOSIS — Z79899 Other long term (current) drug therapy: Secondary | ICD-10-CM | POA: Diagnosis not present

## 2016-11-23 DIAGNOSIS — E871 Hypo-osmolality and hyponatremia: Secondary | ICD-10-CM | POA: Diagnosis not present

## 2016-11-23 DIAGNOSIS — Z853 Personal history of malignant neoplasm of breast: Secondary | ICD-10-CM | POA: Diagnosis not present

## 2016-11-23 DIAGNOSIS — K8020 Calculus of gallbladder without cholecystitis without obstruction: Secondary | ICD-10-CM | POA: Diagnosis not present

## 2016-11-23 DIAGNOSIS — K529 Noninfective gastroenteritis and colitis, unspecified: Secondary | ICD-10-CM | POA: Diagnosis not present

## 2016-11-23 DIAGNOSIS — Z86718 Personal history of other venous thrombosis and embolism: Secondary | ICD-10-CM

## 2016-11-23 DIAGNOSIS — E860 Dehydration: Principal | ICD-10-CM

## 2016-11-23 LAB — CBC
HEMATOCRIT: 33.6 % — AB (ref 35.0–47.0)
Hemoglobin: 11.3 g/dL — ABNORMAL LOW (ref 12.0–16.0)
MCH: 29.1 pg (ref 26.0–34.0)
MCHC: 33.5 g/dL (ref 32.0–36.0)
MCV: 86.7 fL (ref 80.0–100.0)
PLATELETS: 318 10*3/uL (ref 150–440)
RBC: 3.87 MIL/uL (ref 3.80–5.20)
RDW: 14.7 % — AB (ref 11.5–14.5)
WBC: 5.5 10*3/uL (ref 3.6–11.0)

## 2016-11-23 LAB — COMPREHENSIVE METABOLIC PANEL
ALT: 11 U/L — ABNORMAL LOW (ref 14–54)
ANION GAP: 11 (ref 5–15)
AST: 25 U/L (ref 15–41)
Albumin: 3.8 g/dL (ref 3.5–5.0)
Alkaline Phosphatase: 66 U/L (ref 38–126)
BILIRUBIN TOTAL: 0.8 mg/dL (ref 0.3–1.2)
BUN: 13 mg/dL (ref 6–20)
CHLORIDE: 96 mmol/L — AB (ref 101–111)
CO2: 23 mmol/L (ref 22–32)
Calcium: 9.2 mg/dL (ref 8.9–10.3)
Creatinine, Ser: 1.27 mg/dL — ABNORMAL HIGH (ref 0.44–1.00)
GFR calc Af Amer: 43 mL/min — ABNORMAL LOW (ref >60)
GFR, EST NON AFRICAN AMERICAN: 37 mL/min — AB (ref >60)
Glucose, Bld: 199 mg/dL — ABNORMAL HIGH (ref 65–99)
POTASSIUM: 4 mmol/L (ref 3.5–5.1)
Sodium: 130 mmol/L — ABNORMAL LOW (ref 135–145)
Total Protein: 7.5 g/dL (ref 6.5–8.1)

## 2016-11-23 LAB — GLUCOSE, CAPILLARY
GLUCOSE-CAPILLARY: 182 mg/dL — AB (ref 65–99)
Glucose-Capillary: 134 mg/dL — ABNORMAL HIGH (ref 65–99)

## 2016-11-23 LAB — TROPONIN I

## 2016-11-23 LAB — LIPASE, BLOOD: LIPASE: 15 U/L (ref 11–51)

## 2016-11-23 MED ORDER — ENOXAPARIN SODIUM 40 MG/0.4ML ~~LOC~~ SOLN
40.0000 mg | SUBCUTANEOUS | Status: DC
  Administered 2016-11-23 – 2016-11-24 (×3): 40 mg via SUBCUTANEOUS
  Filled 2016-11-23 (×3): qty 0.4

## 2016-11-23 MED ORDER — ACETAMINOPHEN 650 MG RE SUPP: 650.0000 mg | Freq: Four times a day (QID) | RECTAL | Status: DC | PRN

## 2016-11-23 MED ORDER — MORPHINE SULFATE (PF) 2 MG/ML IV SOLN
2.0000 mg | Freq: Once | INTRAVENOUS | Status: AC
  Administered 2016-11-23: 2 mg via INTRAVENOUS

## 2016-11-23 MED ORDER — SODIUM CHLORIDE 0.9 % IV SOLN
1000.0000 mL | Freq: Once | INTRAVENOUS | Status: AC
  Administered 2016-11-23: 1000 mL via INTRAVENOUS

## 2016-11-23 MED ORDER — INSULIN ASPART 100 UNIT/ML ~~LOC~~ SOLN
0.0000 [IU] | Freq: Every day | SUBCUTANEOUS | Status: DC
  Administered 2016-11-24: 2 [IU] via SUBCUTANEOUS
  Filled 2016-11-23: qty 2

## 2016-11-23 MED ORDER — ONDANSETRON HCL 4 MG PO TABS
4.0000 mg | ORAL_TABLET | Freq: Three times a day (TID) | ORAL | Status: DC | PRN
Start: 2016-11-23 — End: 2016-11-23

## 2016-11-23 MED ORDER — HYDRALAZINE HCL 20 MG/ML IJ SOLN
10.0000 mg | Freq: Four times a day (QID) | INTRAMUSCULAR | Status: DC | PRN
  Filled 2016-11-23: qty 1

## 2016-11-23 MED ORDER — SENNOSIDES-DOCUSATE SODIUM 8.6-50 MG PO TABS: 1.0000 | ORAL_TABLET | Freq: Every evening | ORAL | Status: DC | PRN

## 2016-11-23 MED ORDER — IOPAMIDOL (ISOVUE-300) INJECTION 61%
30.0000 mL | Freq: Once | INTRAVENOUS | Status: AC | PRN
  Administered 2016-11-23: 30 mL via ORAL

## 2016-11-23 MED ORDER — MORPHINE SULFATE (PF) 2 MG/ML IV SOLN
INTRAVENOUS | Status: AC
  Administered 2016-11-23: 2 mg via INTRAVENOUS
  Filled 2016-11-23: qty 1

## 2016-11-23 MED ORDER — METOPROLOL SUCCINATE ER 50 MG PO TB24
100.0000 mg | ORAL_TABLET | Freq: Every day | ORAL | Status: DC
  Administered 2016-11-23 – 2016-11-25 (×3): 100 mg via ORAL
  Filled 2016-11-23 (×3): qty 2

## 2016-11-23 MED ORDER — IOPAMIDOL (ISOVUE-300) INJECTION 61%
100.0000 mL | Freq: Once | INTRAVENOUS | Status: AC | PRN
  Administered 2016-11-23: 75 mL via INTRAVENOUS

## 2016-11-23 MED ORDER — ONDANSETRON HCL 4 MG PO TABS: 4.0000 mg | ORAL_TABLET | Freq: Four times a day (QID) | ORAL | Status: DC | PRN

## 2016-11-23 MED ORDER — DICYCLOMINE HCL 20 MG PO TABS
20.0000 mg | ORAL_TABLET | Freq: Three times a day (TID) | ORAL | Status: DC
  Administered 2016-11-23 – 2016-11-25 (×6): 20 mg via ORAL
  Filled 2016-11-23 (×8): qty 1

## 2016-11-23 MED ORDER — ACETAMINOPHEN 325 MG PO TABS: 650.0000 mg | ORAL_TABLET | Freq: Four times a day (QID) | ORAL | Status: DC | PRN

## 2016-11-23 MED ORDER — INSULIN DETEMIR 100 UNIT/ML ~~LOC~~ SOLN
9.0000 [IU] | Freq: Every day | SUBCUTANEOUS | Status: DC
  Administered 2016-11-23 – 2016-11-24 (×2): 9 [IU] via SUBCUTANEOUS
  Filled 2016-11-23 (×3): qty 0.09

## 2016-11-23 MED ORDER — HYOSCYAMINE SULFATE 0.125 MG SL SUBL
0.1250 mg | SUBLINGUAL_TABLET | Freq: Four times a day (QID) | SUBLINGUAL | Status: DC | PRN
Start: 2016-11-23 — End: 2016-11-25

## 2016-11-23 MED ORDER — PRAVASTATIN SODIUM 40 MG PO TABS
80.0000 mg | ORAL_TABLET | Freq: Every evening | ORAL | Status: DC
  Administered 2016-11-23 – 2016-11-24 (×2): 80 mg via ORAL
  Filled 2016-11-23 (×2): qty 2

## 2016-11-23 MED ORDER — GABAPENTIN 300 MG PO CAPS
600.0000 mg | ORAL_CAPSULE | Freq: Three times a day (TID) | ORAL | Status: DC
  Administered 2016-11-23 – 2016-11-25 (×5): 600 mg via ORAL
  Filled 2016-11-23 (×5): qty 2

## 2016-11-23 MED ORDER — ONDANSETRON HCL 4 MG/2ML IJ SOLN: 4.0000 mg | Freq: Four times a day (QID) | INTRAMUSCULAR | Status: DC | PRN

## 2016-11-23 MED ORDER — CLOPIDOGREL BISULFATE 75 MG PO TABS
75.0000 mg | ORAL_TABLET | Freq: Every day | ORAL | Status: DC
  Administered 2016-11-24 – 2016-11-25 (×2): 75 mg via ORAL
  Filled 2016-11-23 (×2): qty 1

## 2016-11-23 MED ORDER — PANTOPRAZOLE SODIUM 40 MG PO TBEC
40.0000 mg | DELAYED_RELEASE_TABLET | Freq: Two times a day (BID) | ORAL | Status: DC
  Administered 2016-11-24 – 2016-11-25 (×3): 40 mg via ORAL
  Filled 2016-11-23 (×4): qty 1

## 2016-11-23 MED ORDER — POLYETHYLENE GLYCOL 3350 17 G PO PACK: 17.0000 g | PACK | Freq: Every day | ORAL | Status: DC

## 2016-11-23 MED ORDER — INSULIN ASPART 100 UNIT/ML ~~LOC~~ SOLN
0.0000 [IU] | Freq: Three times a day (TID) | SUBCUTANEOUS | Status: DC
  Administered 2016-11-24: 2 [IU] via SUBCUTANEOUS
  Administered 2016-11-24: 1 [IU] via SUBCUTANEOUS
  Administered 2016-11-25: 2 [IU] via SUBCUTANEOUS
  Administered 2016-11-25: 1 [IU] via SUBCUTANEOUS
  Filled 2016-11-23: qty 2
  Filled 2016-11-23: qty 1
  Filled 2016-11-23: qty 2
  Filled 2016-11-23: qty 1

## 2016-11-23 MED ORDER — HYDRALAZINE HCL 50 MG PO TABS
50.0000 mg | ORAL_TABLET | Freq: Three times a day (TID) | ORAL | Status: DC
  Administered 2016-11-23 – 2016-11-25 (×6): 50 mg via ORAL
  Filled 2016-11-23 (×6): qty 1

## 2016-11-23 MED ORDER — MORPHINE SULFATE (PF) 2 MG/ML IV SOLN
2.0000 mg | Freq: Once | INTRAVENOUS | Status: AC
Start: 2016-11-23 — End: 2016-11-23
  Administered 2016-11-23: 2 mg via INTRAVENOUS

## 2016-11-23 MED ORDER — ONDANSETRON HCL 4 MG/2ML IJ SOLN
4.0000 mg | Freq: Once | INTRAMUSCULAR | Status: AC
  Administered 2016-11-23: 4 mg via INTRAVENOUS
  Filled 2016-11-23: qty 2

## 2016-11-23 MED ORDER — HYDROCODONE-ACETAMINOPHEN 5-325 MG PO TABS: 1.0000 | ORAL_TABLET | ORAL | Status: DC | PRN

## 2016-11-23 MED ORDER — FLEET ENEMA 7-19 GM/118ML RE ENEM: 1.0000 | ENEMA | Freq: Once | RECTAL | Status: DC | PRN

## 2016-11-23 MED ORDER — SODIUM CHLORIDE 0.9 % IV SOLN
INTRAVENOUS | Status: DC
  Administered 2016-11-23 – 2016-11-25 (×3): via INTRAVENOUS

## 2016-11-23 MED ORDER — MORPHINE SULFATE (PF) 2 MG/ML IV SOLN: 2.0000 mg | INTRAVENOUS | Status: DC | PRN

## 2016-11-23 NOTE — ED Notes (Signed)
Pt informed of need for stool sample. Sts unable to give one at this time

## 2016-11-23 NOTE — ED Notes (Signed)
ED Provider at bedside. 

## 2016-11-23 NOTE — Progress Notes (Signed)
[place] received request for HCPOA; Materials dropped off but patient seemed not to be aware of request. Gwynn Burly

## 2016-11-23 NOTE — ED Notes (Signed)
Attempted to call report

## 2016-11-23 NOTE — ED Triage Notes (Signed)
States diarrhea and dry heaves with nausea since Saturday, states right sided abd pain that goes to her back, awake and alert

## 2016-11-23 NOTE — ED Provider Notes (Signed)
[facility] Emergency Department Provider Note   ____________________________________________    I have reviewed the triage vital signs and the nursing notes.   HISTORY  Chief Complaint Emesis and Diarrhea     HPI Kristen Barron is a 83 y.o. female who presents with complaints of nausea, abdominal cramping and diarrhea. Patient reports the symptoms are approximately 2-3 days ago. She did have a brief period where she felt better but then it started again. She denies sick contacts. No recent travel. No fevers reported. She describes primarily upper abdominal cramping but also diffusely at times.   Past Medical History:  Diagnosis Date  . Arthritis   . Breast cancer ([place]) 03/06/2014   right breast with mastectomy and chemo  . Cancer [facility])    Reports lumpectomy for a cyst  . CHF (congestive heart failure) ([place])   . Collagen vascular disease ([place])   . Diabetes ([place])   . DVT (deep venous thrombosis) ([place])   . Hemorrhoids   . Hypertension   . Renal disorder   . Renal insufficiency   . Stroke [facility])    August 2014, but has had strokes prior as well    Patient Active Problem List   Diagnosis Date Noted  . Abdominal pain 04/09/2016  . Right lower quadrant pain   . Vomiting   . Gallstone   . Osteoarthritis of knee (Bilateral) (L>R) 12/26/2015  . Chronic pain 12/11/2015  . Long term current use of opiate analgesic 12/11/2015  . Encounter for therapeutic drug level monitoring 12/11/2015  . Chronic knee pain (Location of Primary Source of Pain) (Bilateral) (L>R) 12/11/2015  . Long term prescription opiate use 12/11/2015  . Opiate use (20 MME/Day) 12/11/2015  . Chronic hand pain (Location of Secondary source of pain) (Bilateral) (L>R) 12/11/2015  . Failed back surgical syndrome x 2 (Surgeon: Dr. Deri Fuelling) 12/11/2015  . Lumbar spondylosis 12/11/2015  . Chronic low back pain (Location of Tertiary source of pain) (Bilateral) (L>R) 12/11/2015  .  Lumbar facet syndrome 12/11/2015  . Chronic lower extremity pain (Bilateral) (L>R) 12/11/2015  . Chronic neck pain (Left) 12/11/2015  . Cervical spondylosis 12/11/2015  . Chronic shoulder pain (Right) 12/11/2015  . Insulin dependent diabetes mellitus ([place]) 12/11/2015  . Abnormal MRI, lumbar spine (07/19/2013) 12/11/2015  . Lumbar postlaminectomy syndrome (L4-5) 12/11/2015  . Fusion of lumbar spine (L4-5 Ray cages) 12/11/2015  . Lumbar Levoscoliosis with apex at L4 12/11/2015  . Grade 1 Retrolisthesis of L2 over L3 and L3 over L4 12/11/2015  . Lumbar facet arthropathy ([place]) 12/11/2015  . Lumbar foraminal stenosis (Bilateral L2-3, L3-4 & Left L5-S1) 12/11/2015  . Chronic anticoagulation (Plavix) 12/11/2015  . Breast cancer of lower-outer quadrant of right female breast ([place]) 02/28/2014  . Lumbar stenosis with neurogenic claudication 07/24/2013  . Lumbar central spinal stenosis ( Severe at L3-4) (Mild R>L L2-3 & L5-S1) 07/20/2013  . Hyponatremia 07/20/2013  . Hypertension 07/20/2013  . Normocytic anemia 07/20/2013  . CKD (chronic kidney disease) stage 3, GFR 30-59 ml/min 07/20/2013  .  Lumbar DDD (degenerative disc disease) 07/20/2013    Past Surgical History:  Procedure Laterality Date  . ABDOMINAL HYSTERECTOMY     Patient not clear as to why  . BACK SURGERY    . BREAST BIOPSY Right February 15 2014   invasive mammary carcinoma/ER/PR negative, HER 2 positive  . BREAST BIOPSY Right 1999   negative biopsy  . BREAST SURGERY Right 03/06/14   mastectomy  . COLONOSCOPY  WITH PROPOFOL N/A 04/15/2016   Procedure: COLONOSCOPY WITH PROPOFOL;  Surgeon: Manya Silvas, MD;  Location: [facility] ENDOSCOPY;  Service: Endoscopy;  Laterality: N/A;  . ESOPHAGOGASTRODUODENOSCOPY (EGD) WITH PROPOFOL N/A 04/15/2016   Procedure: ESOPHAGOGASTRODUODENOSCOPY (EGD) WITH PROPOFOL;  Surgeon: Manya Silvas, MD;  Location: [facility] ENDOSCOPY;  Service: Endoscopy;  Laterality: N/A;  . HAND SURGERY Right    Carpel tunnel  release in the 1970s  . LUMBAR LAMINECTOMY/DECOMPRESSION MICRODISCECTOMY Bilateral 07/24/2013   Procedure: LUMBAR LAMINECTOMY/DECOMPRESSION MICRODISCECTOMY LUMBAR THREE-FOUR;  Surgeon: Charlie Pitter, MD;  Location: [facility] NEURO ORS;  Service: Neurosurgery;  Laterality: Bilateral;  . MASTECTOMY Right 03/06/2014   right br ca    Prior to Admission medications   Medication Sig Start Date End Date Taking? Authorizing Provider  acetaminophen (TYLENOL) 500 MG tablet Take 500 mg by mouth every 6 (six) hours as needed.   Yes Historical Provider, MD  benazepril (LOTENSIN) 20 MG tablet Take 20 mg by mouth daily. 10/15/16  Yes Historical Provider, MD  clopidogrel (PLAVIX) 75 MG tablet Take 75 mg by mouth daily with breakfast.   Yes Historical Provider, MD  dicyclomine (BENTYL) 20 MG tablet Take 1 tablet (20 mg total) by mouth 4 (four) times daily -  before meals and at bedtime. 04/15/16  Yes Loletha Grayer, MD  gabapentin (NEURONTIN) 300 MG capsule Take 600 mg by mouth 3 (three) times daily.  12/04/13  Yes Historical Provider, MD  Glucosamine-Chondroit-Vit C-Mn (GLUCOSAMINE 1500 COMPLEX PO) Take by mouth 3 (three) times daily.   Yes Historical Provider, MD  hydrALAZINE (APRESOLINE) 50 MG tablet Take 50 mg by mouth 3 (three) times daily.    Yes Historical Provider, MD  HYDROcodone-acetaminophen (NORCO/VICODIN) 5-325 MG per tablet Take 1 tablet by mouth every 6 (six) hours as needed.  02/16/14  Yes Historical Provider, MD  hyoscyamine (LEVSIN SL) 0.125 MG SL tablet Place 1 tablet (0.125 mg total) under the tongue every 6 (six) hours as needed for cramping. 04/15/16  Yes Richard Leslye Peer, MD  insulin aspart (NOVOLOG) 100 UNIT/ML injection Inject 8 Units into the skin 3 (three) times daily before meals.   Yes Historical Provider, MD  insulin detemir (LEVEMIR) 100 UNIT/ML injection Inject 18 Units into the skin at bedtime.   Yes Historical Provider, MD  lisinopril (PRINIVIL,ZESTRIL) 20 MG tablet Take 1 tablet (20 mg total) by  mouth daily. 04/15/16  Yes Loletha Grayer, MD  magnesium oxide (MAG-OX) 400 MG tablet Take 400 mg by mouth 2 (two) times daily.   Yes Historical Provider, MD  Melatonin 5 MG TABS Take 5 mg by mouth at bedtime.   Yes Historical Provider, MD  metoprolol succinate (TOPROL-XL) 100 MG 24 hr tablet Take 100 mg by mouth daily. Take with or immediately following a meal.   Yes Historical Provider, MD  Omega 3 1200 MG CAPS Take by mouth 2 (two) times daily.   Yes Historical Provider, MD  ondansetron (ZOFRAN) 4 MG tablet Take 4 mg by mouth every 8 (eight) hours as needed for nausea or vomiting.   Yes Historical Provider, MD  pantoprazole (PROTONIX) 40 MG tablet Take 1 tablet (40 mg total) by mouth 2 (two) times daily before a meal. 04/09/16  Yes Srikar Sudini, MD  polyethylene glycol (MIRALAX / GLYCOLAX) packet Take 17 g by mouth daily. 04/15/16  Yes Richard Leslye Peer, MD  potassium chloride SA (K-DUR,KLOR-CON) 20 MEQ tablet Take 1 tablet (20 mEq total) by mouth daily. 04/16/16  Yes Gladstone Lighter, MD  pravastatin (PRAVACHOL) 80 MG  tablet Take 80 mg by mouth every evening.    Yes Historical Provider, MD  torsemide (DEMADEX) 20 MG tablet Take 20 mg by mouth daily.    Yes Historical Provider, MD  traMADol (ULTRAM) 50 MG tablet Take 1 tablet (50 mg total) by mouth every 12 (twelve) hours as needed for severe pain. 04/09/16  Yes Hillary Bow, MD     Allergies Patient has no known allergies.  Family History  Problem Relation Age of Onset  . Cancer Sister 61    breast  . Breast cancer Sister 33  . Cancer Brother     kidney  . Cancer Maternal Aunt     breast  . Breast cancer Maternal Aunt 70  . Cancer Other     maternal niece with breast cancer  . Breast cancer Other   . Cancer Sister     pancreatic    Social History Social History  Substance Use Topics  . Smoking status: Never Smoker  . Smokeless tobacco: Never Used  . Alcohol use No    Review of Systems  Constitutional: No fever/chills Eyes:  No visual changes.  ENT: No sore throat. Cardiovascular: Denies chest pain. Respiratory: Denies shortness of breath. Gastrointestinal: As above Genitourinary: Negative for dysuria. Musculoskeletal: Negative for myalgias Skin: Negative for rash. Neurological: Negative for headaches   10-point ROS otherwise negative.  ____________________________________________   PHYSICAL EXAM:  VITAL SIGNS: ED Triage Vitals  Enc Vitals Group     BP 11/23/16 1010 (!) 164/51     Pulse Rate 11/23/16 1010 73     Resp 11/23/16 1010 18     Temp 11/23/16 1010 98.8 F (37.1 C)     Temp Source 11/23/16 1010 Oral     SpO2 11/23/16 1010 100 %     Weight 11/23/16 1009 191 lb (86.6 kg)     Height 11/23/16 1009 5\' 1"  (1.549 m)     Head Circumference --      Peak Flow --      Pain Score 11/23/16 1008 10     Pain Loc --      Pain Edu? --      Excl. in [place]? --     Constitutional: Alert and oriented. No acute distress. Pleasant and interactive Eyes: Conjunctivae are normal.   Nose: No congestion/rhinnorhea. Mouth/Throat: Mucous membranes are moist.    Cardiovascular: Normal rate, regular rhythm. Grossly normal heart sounds.  Good peripheral circulation. Respiratory: Normal respiratory effort.  No retractions. Lungs CTAB. Gastrointestinal: Mild tenderness in the upper abdomen bilaterally. No distention.  No CVA tenderness. Genitourinary: deferred Musculoskeletal: No lower extremity tenderness nor edema.  Warm and well perfused Neurologic:  Normal speech and language. No gross focal neurologic deficits are appreciated.  Skin:  Skin is warm, dry and intact. No rash noted. Psychiatric: Mood and affect are normal. Speech and behavior are normal.  ____________________________________________   LABS (all labs ordered are listed, but only abnormal results are displayed)  Labs Reviewed  CBC - Abnormal; Notable for the following:       Result Value   Hemoglobin 11.3 (*)    HCT 33.6 (*)    RDW 14.7  (*)    All other components within normal limits  COMPREHENSIVE METABOLIC PANEL - Abnormal; Notable for the following:    Sodium 130 (*)    Chloride 96 (*)    Glucose, Bld 199 (*)    Creatinine, Ser 1.27 (*)    ALT 11 (*)    GFR  calc non Af Amer 37 (*)    GFR calc Af Amer 43 (*)    All other components within normal limits  LIPASE, BLOOD  TROPONIN I   ____________________________________________  EKG  None ____________________________________________  RADIOLOGY  CT scan shows no acute abnormality ____________________________________________   PROCEDURES  Procedure(s) performed: No    Critical Care performed: No ____________________________________________   INITIAL IMPRESSION / ASSESSMENT AND PLAN / ED COURSE  Pertinent labs & imaging results that were available during my care of the patient were reviewed by me and considered in my medical decision making (see chart for details).  Patient presents with nausea, diarrhea and abdominal cramping. Suspect viral gastroenteritis which is, at this time. We will obtain labs, give IV fluids, IV Zofran obtain CT abdomen pelvis as needed and reevaluate.  Patient has received 2 doses of IV morphine but continues to have abdominal pain. CT abdomen pelvis is reassuring, distended gallbladder although no pain over her gallbladder on exam. Feel the patient will require admission given dehydration unable to tolerate by mouth's, pain, will discuss with hospitalist.       ____________________________________________   FINAL CLINICAL IMPRESSION(S) / ED DIAGNOSES  Final diagnoses:  Dehydration  Gastroenteritis      NEW MEDICATIONS STARTED DURING THIS VISIT:  New Prescriptions   No medications on file     Note:  This document was prepared using Dragon voice recognition software and may include unintentional dictation errors.    Lavonia Drafts, MD 11/23/16 1438

## 2016-11-23 NOTE — Consult Note (Signed)
Date of Consultation:  11/23/2016  Requesting Physician:  Bettey Costa, MD  Reason for Consultation:  Abdominal pain  History of Present Illness: Kristen Barron is a 83 y.o. female who presents with 3 day history of abdominal pain.  Patient reports that last week the patient had some nausea and diarrhea that went away on its own, but then on 4/14 she woke up with right sided abdominal pain.  She has had intermittent abdominal pain since then, though her pain has not eased up since yesterday as much.  She did have nausea and emesis, though it was clear contents only.  She has also had some diarrhea, with the last episode being yesterday.  She reports that the pain is on her right side, and radiates to her back.  Currently her pain is better after pain medication.  Denies any fevers, chills, chest pain, shortness of breath.  Denies any dysuria, hematuria, or blood in the stool.    She was in the hospital on 04/2016 with abdominal pain, nausea, and diarrhea, and workup included CT scan, U/S, and HIDA scan which were negative for gallbladder etiology.  However, she reports that since then, she has had intermittent, alternating constipation and diarrhea.  She had an appointment with gastroenterology but did not show up. She did have a UTI about a month ago.  Past Medical History: Past Medical History:  Diagnosis Date  . Arthritis   . Breast cancer ([place]) 03/06/2014   right breast with mastectomy and chemo  . Cancer [facility])    Reports lumpectomy for a cyst  . CHF (congestive heart failure) ([place])   . Collagen vascular disease ([place])   . Diabetes ([place])   . DVT (deep venous thrombosis) ([place])   . Hemorrhoids   . Hypertension   . Renal disorder   . Renal insufficiency   . Stroke [facility])    August 2014, but has had strokes prior as well     Past Surgical History: Past Surgical History:  Procedure Laterality Date  . ABDOMINAL HYSTERECTOMY     Patient not clear as to why  . BACK SURGERY    . BREAST  BIOPSY Right February 15 2014   invasive mammary carcinoma/ER/PR negative, HER 2 positive  . BREAST BIOPSY Right 1999   negative biopsy  . BREAST SURGERY Right 03/06/14   mastectomy  . COLONOSCOPY WITH PROPOFOL N/A 04/15/2016   Procedure: COLONOSCOPY WITH PROPOFOL;  Surgeon: Manya Silvas, MD;  Location: [facility] ENDOSCOPY;  Service: Endoscopy;  Laterality: N/A;  . ESOPHAGOGASTRODUODENOSCOPY (EGD) WITH PROPOFOL N/A 04/15/2016   Procedure: ESOPHAGOGASTRODUODENOSCOPY (EGD) WITH PROPOFOL;  Surgeon: Manya Silvas, MD;  Location: [facility] ENDOSCOPY;  Service: Endoscopy;  Laterality: N/A;  . HAND SURGERY Right    Carpel tunnel release in the 1970s  . LUMBAR LAMINECTOMY/DECOMPRESSION MICRODISCECTOMY Bilateral 07/24/2013   Procedure: LUMBAR LAMINECTOMY/DECOMPRESSION MICRODISCECTOMY LUMBAR THREE-FOUR;  Surgeon: Charlie Pitter, MD;  Location: [place] NEURO ORS;  Service: Neurosurgery;  Laterality: Bilateral;  . MASTECTOMY Right 03/06/2014   right br ca    Home Medications: Prior to Admission medications   Medication Sig Start Date End Date Taking? Authorizing Provider  acetaminophen (TYLENOL) 500 MG tablet Take 500 mg by mouth every 6 (six) hours as needed.   Yes Historical Provider, MD  benazepril (LOTENSIN) 20 MG tablet Take 20 mg by mouth daily. 10/15/16  Yes Historical Provider, MD  clopidogrel (PLAVIX) 75 MG tablet Take 75 mg by mouth daily with breakfast.   Yes Historical Provider, MD  dicyclomine (BENTYL) 20 MG tablet Take 1 tablet (20 mg total) by mouth 4 (four) times daily -  before meals and at bedtime. 04/15/16  Yes Loletha Grayer, MD  gabapentin (NEURONTIN) 300 MG capsule Take 600 mg by mouth 3 (three) times daily.  12/04/13  Yes Historical Provider, MD  Glucosamine-Chondroit-Vit C-Mn (GLUCOSAMINE 1500 COMPLEX PO) Take by mouth 3 (three) times daily.   Yes Historical Provider, MD  hydrALAZINE (APRESOLINE) 50 MG tablet Take 50 mg by mouth 3 (three) times daily.    Yes Historical Provider, MD   HYDROcodone-acetaminophen (NORCO/VICODIN) 5-325 MG per tablet Take 1 tablet by mouth every 6 (six) hours as needed.  02/16/14  Yes Historical Provider, MD  hyoscyamine (LEVSIN SL) 0.125 MG SL tablet Place 1 tablet (0.125 mg total) under the tongue every 6 (six) hours as needed for cramping. 04/15/16  Yes Richard Leslye Peer, MD  insulin aspart (NOVOLOG) 100 UNIT/ML injection Inject 8 Units into the skin 3 (three) times daily before meals.   Yes Historical Provider, MD  insulin detemir (LEVEMIR) 100 UNIT/ML injection Inject 18 Units into the skin at bedtime.   Yes Historical Provider, MD  lisinopril (PRINIVIL,ZESTRIL) 20 MG tablet Take 1 tablet (20 mg total) by mouth daily. 04/15/16  Yes Loletha Grayer, MD  magnesium oxide (MAG-OX) 400 MG tablet Take 400 mg by mouth 2 (two) times daily.   Yes Historical Provider, MD  Melatonin 5 MG TABS Take 5 mg by mouth at bedtime.   Yes Historical Provider, MD  metoprolol succinate (TOPROL-XL) 100 MG 24 hr tablet Take 100 mg by mouth daily. Take with or immediately following a meal.   Yes Historical Provider, MD  Omega 3 1200 MG CAPS Take by mouth 2 (two) times daily.   Yes Historical Provider, MD  ondansetron (ZOFRAN) 4 MG tablet Take 4 mg by mouth every 8 (eight) hours as needed for nausea or vomiting.   Yes Historical Provider, MD  pantoprazole (PROTONIX) 40 MG tablet Take 1 tablet (40 mg total) by mouth 2 (two) times daily before a meal. 04/09/16  Yes Srikar Sudini, MD  polyethylene glycol (MIRALAX / GLYCOLAX) packet Take 17 g by mouth daily. 04/15/16  Yes Richard Leslye Peer, MD  potassium chloride SA (K-DUR,KLOR-CON) 20 MEQ tablet Take 1 tablet (20 mEq total) by mouth daily. 04/16/16  Yes Gladstone Lighter, MD  pravastatin (PRAVACHOL) 80 MG tablet Take 80 mg by mouth every evening.    Yes Historical Provider, MD  torsemide (DEMADEX) 20 MG tablet Take 20 mg by mouth daily.    Yes Historical Provider, MD  traMADol (ULTRAM) 50 MG tablet Take 1 tablet (50 mg total) by mouth  every 12 (twelve) hours as needed for severe pain. 04/09/16  Yes Srikar Sudini, MD    Allergies: No Known Allergies  Social History:  reports that she has never smoked. She has never used smokeless tobacco. She reports that she does not drink alcohol or use drugs.   Family History: Family History  Problem Relation Age of Onset  . Cancer Sister 69    breast  . Breast cancer Sister 34  . Cancer Brother     kidney  . Cancer Maternal Aunt     breast  . Breast cancer Maternal Aunt 70  . Cancer Other     maternal niece with breast cancer  . Breast cancer Other   . Cancer Sister     pancreatic    Review of Systems: Review of Systems  Constitutional: Negative for chills and  fever.  HENT: Negative for hearing loss.   Eyes: Negative for blurred vision.  Respiratory: Negative for cough and shortness of breath.   Cardiovascular: Negative for chest pain and leg swelling.  Gastrointestinal: Positive for abdominal pain, diarrhea, nausea and vomiting. Negative for heartburn.  Genitourinary: Negative for dysuria and hematuria.  Musculoskeletal: Negative for myalgias.  Skin: Negative for rash.  Neurological: Negative for dizziness.  Psychiatric/Behavioral: Negative for depression.    Physical Exam BP (!) 156/68   Pulse 84   Temp 98.8 F (37.1 C) (Oral)   Resp 19   Ht 5\' 1"  (1.549 m)   Wt 86.6 kg (191 lb)   SpO2 98%   BMI 36.09 kg/m  CONSTITUTIONAL: No acute distress HEENT:  Normocephalic, atraumatic, extraocular motion intact. NECK: Trachea is midline, and there is no jugular venous distension.  RESPIRATORY:  Lungs are clear, and breath sounds are equal bilaterally. Normal respiratory effort without pathologic use of accessory muscles. CARDIOVASCULAR: Heart is regular without murmurs, gallops, or rubs. GI: The abdomen is soft, non-distended, with mild tenderness to palpation over the right mid abdomen and right flank, as well as mild tenderness over the mid abdomen towards  epigastric region.  The patient has a reducible umbilical hernia which is not tender. GU:  Positive right sided CVA tenderness. MUSCULOSKELETAL:  Normal muscle strength and tone in all four extremities.  No peripheral edema or cyanosis. SKIN: Skin turgor is normal. There are no pathologic skin lesions.  NEUROLOGIC:  Motor and sensation is grossly normal.  Cranial nerves are grossly intact. PSYCH:  Alert and oriented to person, place and time. Affect is normal.  Laboratory Analysis: Results for orders placed or performed during the hospital encounter of 11/23/16 (from the past 24 hour(s))  CBC     Status: Abnormal   Collection Time: 11/23/16 10:24 AM  Result Value Ref Range   WBC 5.5 3.6 - 11.0 K/uL   RBC 3.87 3.80 - 5.20 MIL/uL   Hemoglobin 11.3 (L) 12.0 - 16.0 g/dL   HCT 33.6 (L) 35.0 - 47.0 %   MCV 86.7 80.0 - 100.0 fL   MCH 29.1 26.0 - 34.0 pg   MCHC 33.5 32.0 - 36.0 g/dL   RDW 14.7 (H) 11.5 - 14.5 %   Platelets 318 150 - 440 K/uL  Comprehensive metabolic panel     Status: Abnormal   Collection Time: 11/23/16 10:24 AM  Result Value Ref Range   Sodium 130 (L) 135 - 145 mmol/L   Potassium 4.0 3.5 - 5.1 mmol/L   Chloride 96 (L) 101 - 111 mmol/L   CO2 23 22 - 32 mmol/L   Glucose, Bld 199 (H) 65 - 99 mg/dL   BUN 13 6 - 20 mg/dL   Creatinine, Ser 1.27 (H) 0.44 - 1.00 mg/dL   Calcium 9.2 8.9 - 10.3 mg/dL   Total Protein 7.5 6.5 - 8.1 g/dL   Albumin 3.8 3.5 - 5.0 g/dL   AST 25 15 - 41 U/L   ALT 11 (L) 14 - 54 U/L   Alkaline Phosphatase 66 38 - 126 U/L   Total Bilirubin 0.8 0.3 - 1.2 mg/dL   GFR calc non Af Amer 37 (L) >60 mL/min   GFR calc Af Amer 43 (L) >60 mL/min   Anion gap 11 5 - 15  Lipase, blood     Status: None   Collection Time: 11/23/16 10:24 AM  Result Value Ref Range   Lipase 15 11 - 51 U/L  Troponin I  Status: None   Collection Time: 11/23/16 10:24 AM  Result Value Ref Range   Troponin I <0.03 <0.03 ng/mL    Imaging: Ct Abdomen Pelvis W Contrast  Result  Date: 11/23/2016 CLINICAL DATA:  )Diarrhea and dry heaves with nausea since Saturday. Right-sided abdominal pain that radiates to her back. EXAM: CT ABDOMEN AND PELVIS WITH CONTRAST TECHNIQUE: Multidetector CT imaging of the abdomen and pelvis was performed using the standard protocol following bolus administration of intravenous contrast. CONTRAST:  30mL ISOVUE-300 IOPAMIDOL (ISOVUE-300) INJECTION 61% COMPARISON:  04/09/2016 FINDINGS: Lower chest: 18 mm nodular area of potential chronic atelectasis or scarring noted right costophrenic sulcus , stable since prior study and present on exam from 11/20/2013, suggesting benign process. Hepatobiliary: No focal abnormality within the liver parenchyma. Gallbladder is distended with 9 mm stone identified in the lumen. Other scattered smaller stones are evident. No intrahepatic or extrahepatic biliary dilation. Pancreas: Pancreas is diffusely atrophic. Spleen: Calcified granulomata. Adrenals/Urinary Tract: No adrenal nodule or mass. 10 mm hypoattenuating lesion lower pole right kidney is stable, likely a tiny cyst. Similar 11 mm lesion upper pole right kidney also likely a cyst. Left kidney unremarkable. No evidence for hydroureter. The urinary bladder appears normal for the degree of distention. Stomach/Bowel: Stomach is nondistended. No gastric wall thickening. No evidence of outlet obstruction. Duodenum is normally positioned as is the ligament of Treitz. No small bowel wall thickening. No small bowel dilatation. The terminal ileum is normal. The appendix is not visualized, but there is no edema or inflammation in the region of the cecum. No gross colonic mass. No colonic wall thickening. No substantial diverticular change. Vascular/Lymphatic: There is abdominal aortic atherosclerosis without aneurysm. There is no gastrohepatic or hepatoduodenal ligament lymphadenopathy. No intraperitoneal or retroperitoneal lymphadenopathy. No pelvic sidewall lymphadenopathy.  Reproductive: Uterus surgically absent. There is no adnexal mass. Small cystic focus right ovary stable since 11/20/2013. Other: No intraperitoneal free fluid. Musculoskeletal: Bone windows reveal no worrisome lytic or sclerotic osseous lesions. Stable appearance small midline ventral hernia containing only fat. IMPRESSION: 1. No acute findings in the abdomen or pelvis. Specifically, no findings to explain the patient's history of abdominal pain. 2. Cholelithiasis with distended gallbladder, but no evidence for gallbladder wall thickening or pericholecystic fluid. No associated biliary dilatation. 3.  Abdominal Aortic Atherosclerois (ICD10-170.0) 4. Small midline ventral hernia contains only fat. Electronically Signed   By: Misty Stanley M.D.   On: 11/23/2016 13:31   US Abdomen Limited Ruq  Result Date: 11/23/2016 CLINICAL DATA:  Gallstones, right upper quadrant pain for 3 days. EXAM: US ABDOMEN LIMITED - RIGHT UPPER QUADRANT COMPARISON:  CT abdomen pelvis 11/23/2016. Abdominal ultrasound 04/12/2016. FINDINGS: Gallbladder: Appears distended with shadowing echogenic stones measuring up to 1.4 cm. Gallbladder wall is within normal limits, 3 mm. Negative sonographic Murphy sign. Common bile duct: Diameter: 3 mm, within normal limits. Liver: Echogenicity is diffusely increased. IMPRESSION: 1. Cholelithiasis without evidence of acute cholecystitis. 2. Liver appears fatty. Electronically Signed   By: Lorin Picket M.D.   On: 11/23/2016 16:06    Assessment and Plan: This is a 82 y.o. female who presents with 2-3 day history of abdominal pain, nausea, vomiting, and diarrhea.  I have independently viewed the patient's imaging studies and laboratory studies and discussed them with the patient.  She has a normal WBC and LFTs.  Her CT scan shows a somewhat distended gallbladder but no evidence of wall thickening or surrounding fluid.  Her Ultrasound shows a distended gallbladder as well with stones but  no gallbladder  wall thickening and no pericholecystic fluid.  The patient does have right sided abdominal pain as well as right sided CVA tenderness.  Currently unclear if her pain etiology is from her gallbladder, though her labs and imaging studies do not support that.  Gallbladder pathology should not be causing intermittent constipation and diarrhea either.  Would recommend obtaining urinalysis to check for UTI and also a HIDA scan to evaluate for biliary pathology.  May have clear liquids today, make NPO after midnight for HIDA scan.  Patient understands this plan and all of her questions have been answered.   Melvyn Neth, [place]

## 2016-11-23 NOTE — H&P (Addendum)
[facility] [place] NAME: Kristen Barron    MR#:  [id number]  DATE OF BIRTH:  10/06/1928  DATE OF ADMISSION:  11/23/2016  PRIMARY CARE PHYSICIAN: Baltazar Apo, MD   REQUESTING/REFERRING PHYSICIAN: Dr. Corky Downs  CHIEF COMPLAINT:   Nausea vomiting and diarrhea HISTORY OF PRESENT ILLNESS:  Kristen Barron  is a 83 y.o. female with a known history of  Diastolic heart failure, diabetes and hypertension who presents with above complaint. Patient reports over the past week she has had nausea, vomiting and diarrhea. She reports that her diarrhea and nausea have worsened. She reports that she is having right upper quadrant abdominal pain which is currently a 5 out of 10. Morphine did subside the pain. She was hospitalized in September for abdominal pain with nausea and vomiting. At that time she had colonoscopy which showed colonic spasms consistent with irritable bowel syndrome and she was started on that until and high close to mean. She also had a HIDA scan due to gallstones which was negative.  CT of the abdomen today shows gallstones but no acute pathology.  PAST MEDICAL HISTORY:   Past Medical History:  Diagnosis Date  . Arthritis   . Breast cancer ([place]) 03/06/2014   right breast with mastectomy and chemo  . Cancer [facility])    Reports lumpectomy for a cyst  . CHF (congestive heart failure) ([place])   . Collagen vascular disease ([place])   . Diabetes ([facility] [place])   . DVT (deep venous thrombosis) ([place])   . Hemorrhoids   . Hypertension   . Renal disorder   . Renal insufficiency   . Stroke [facility])    August 2014, but has had strokes prior as well    PAST SURGICAL HISTORY:   Past Surgical History:  Procedure Laterality Date  . ABDOMINAL HYSTERECTOMY     Patient not clear as to why  . BACK SURGERY    . BREAST BIOPSY Right February 15 2014   invasive mammary carcinoma/ER/PR negative, HER 2 positive  . BREAST BIOPSY Right 1999   negative biopsy  . BREAST SURGERY  Right 03/06/14   mastectomy  . COLONOSCOPY WITH PROPOFOL N/A 04/15/2016   Procedure: COLONOSCOPY WITH PROPOFOL;  Surgeon: Manya Silvas, MD;  Location: [facility] ENDOSCOPY;  Service: Endoscopy;  Laterality: N/A;  . ESOPHAGOGASTRODUODENOSCOPY (EGD) WITH PROPOFOL N/A 04/15/2016   Procedure: ESOPHAGOGASTRODUODENOSCOPY (EGD) WITH PROPOFOL;  Surgeon: Manya Silvas, MD;  Location: [facility] ENDOSCOPY;  Service: Endoscopy;  Laterality: N/A;  . HAND SURGERY Right    Carpel tunnel release in the 1970s  . LUMBAR LAMINECTOMY/DECOMPRESSION MICRODISCECTOMY Bilateral 07/24/2013   Procedure: LUMBAR LAMINECTOMY/DECOMPRESSION MICRODISCECTOMY LUMBAR THREE-FOUR;  Surgeon: Charlie Pitter, MD;  Location: [facility] NEURO ORS;  Service: Neurosurgery;  Laterality: Bilateral;  . MASTECTOMY Right 03/06/2014   right br ca    SOCIAL HISTORY:   Social History  Substance Use Topics  . Smoking status: Never Smoker  . Smokeless tobacco: Never Used  . Alcohol use No    FAMILY HISTORY:   Family History  Problem Relation Age of Onset  . Cancer Sister 62    breast  . Breast cancer Sister 56  . Cancer Brother     kidney  . Cancer Maternal Aunt     breast  . Breast cancer Maternal Aunt 70  . Cancer Other     maternal niece with breast cancer  . Breast cancer Other   . Cancer Sister     pancreatic  DRUG ALLERGIES:  No Known Allergies  REVIEW OF SYSTEMS:   Review of Systems  Constitutional: Negative.  Negative for chills, fever and malaise/fatigue.  HENT: Negative.  Negative for ear discharge, ear pain, hearing loss, nosebleeds and sore throat.   Eyes: Negative.  Negative for blurred vision and pain.  Respiratory: Negative.  Negative for cough, hemoptysis, shortness of breath and wheezing.   Cardiovascular: Positive for leg swelling. Negative for chest pain and palpitations.  Gastrointestinal: Positive for abdominal pain, diarrhea, nausea and vomiting. Negative for blood in stool.  Genitourinary: Negative.  Negative  for dysuria.  Musculoskeletal: Negative.  Negative for back pain.  Skin: Negative.   Neurological: Negative for dizziness, tremors, speech change, focal weakness, seizures and headaches.  Endo/Heme/Allergies: Negative.  Does not bruise/bleed easily.  Psychiatric/Behavioral: Negative.  Negative for depression, hallucinations and suicidal ideas.    MEDICATIONS AT HOME:   Prior to Admission medications   Medication Sig Start Date End Date Taking? Authorizing Provider  acetaminophen (TYLENOL) 500 MG tablet Take 500 mg by mouth every 6 (six) hours as needed.   Yes Historical Provider, MD  benazepril (LOTENSIN) 20 MG tablet Take 20 mg by mouth daily. 10/15/16  Yes Historical Provider, MD  clopidogrel (PLAVIX) 75 MG tablet Take 75 mg by mouth daily with breakfast.   Yes Historical Provider, MD  dicyclomine (BENTYL) 20 MG tablet Take 1 tablet (20 mg total) by mouth 4 (four) times daily -  before meals and at bedtime. 04/15/16  Yes Loletha Grayer, MD  gabapentin (NEURONTIN) 300 MG capsule Take 600 mg by mouth 3 (three) times daily.  12/04/13  Yes Historical Provider, MD  Glucosamine-Chondroit-Vit C-Mn (GLUCOSAMINE 1500 COMPLEX PO) Take by mouth 3 (three) times daily.   Yes Historical Provider, MD  hydrALAZINE (APRESOLINE) 50 MG tablet Take 50 mg by mouth 3 (three) times daily.    Yes Historical Provider, MD  HYDROcodone-acetaminophen (NORCO/VICODIN) 5-325 MG per tablet Take 1 tablet by mouth every 6 (six) hours as needed.  02/16/14  Yes Historical Provider, MD  hyoscyamine (LEVSIN SL) 0.125 MG SL tablet Place 1 tablet (0.125 mg total) under the tongue every 6 (six) hours as needed for cramping. 04/15/16  Yes Richard Leslye Peer, MD  insulin aspart (NOVOLOG) 100 UNIT/ML injection Inject 8 Units into the skin 3 (three) times daily before meals.   Yes Historical Provider, MD  insulin detemir (LEVEMIR) 100 UNIT/ML injection Inject 18 Units into the skin at bedtime.   Yes Historical Provider, MD  lisinopril  (PRINIVIL,ZESTRIL) 20 MG tablet Take 1 tablet (20 mg total) by mouth daily. 04/15/16  Yes Loletha Grayer, MD  magnesium oxide (MAG-OX) 400 MG tablet Take 400 mg by mouth 2 (two) times daily.   Yes Historical Provider, MD  Melatonin 5 MG TABS Take 5 mg by mouth at bedtime.   Yes Historical Provider, MD  metoprolol succinate (TOPROL-XL) 100 MG 24 hr tablet Take 100 mg by mouth daily. Take with or immediately following a meal.   Yes Historical Provider, MD  Omega 3 1200 MG CAPS Take by mouth 2 (two) times daily.   Yes Historical Provider, MD  ondansetron (ZOFRAN) 4 MG tablet Take 4 mg by mouth every 8 (eight) hours as needed for nausea or vomiting.   Yes Historical Provider, MD  pantoprazole (PROTONIX) 40 MG tablet Take 1 tablet (40 mg total) by mouth 2 (two) times daily before a meal. 04/09/16  Yes Srikar Sudini, MD  polyethylene glycol (MIRALAX / GLYCOLAX) packet Take 17 g by  mouth daily. 04/15/16  Yes Richard Leslye Peer, MD  potassium chloride SA (K-DUR,KLOR-CON) 20 MEQ tablet Take 1 tablet (20 mEq total) by mouth daily. 04/16/16  Yes Gladstone Lighter, MD  pravastatin (PRAVACHOL) 80 MG tablet Take 80 mg by mouth every evening.    Yes Historical Provider, MD  torsemide (DEMADEX) 20 MG tablet Take 20 mg by mouth daily.    Yes Historical Provider, MD  traMADol (ULTRAM) 50 MG tablet Take 1 tablet (50 mg total) by mouth every 12 (twelve) hours as needed for severe pain. 04/09/16  Yes Srikar Sudini, MD      VITAL SIGNS:  Blood pressure (!) 183/74, pulse 84, temperature 98.8 F (37.1 C), temperature source Oral, resp. rate 17, height 5\' 1"  (1.549 m), weight 86.6 kg (191 lb), SpO2 93 %.  PHYSICAL EXAMINATION:   Physical Exam  Constitutional: She is oriented to person, place, and time and well-developed, well-nourished, and in no distress. No distress.  HENT:  Head: Normocephalic.  Eyes: No scleral icterus.  Neck: Normal range of motion. Neck supple. No JVD present. No tracheal deviation present.   Cardiovascular: Normal rate, regular rhythm and normal heart sounds.  Exam reveals no gallop and no friction rub.   No murmur heard. Pulmonary/Chest: Effort normal and breath sounds normal. No respiratory distress. She has no wheezes. She has no rales. She exhibits no tenderness.  Abdominal: Soft. Bowel sounds are normal. She exhibits no distension and no mass. There is tenderness. There is no rebound and no guarding.  Musculoskeletal: Normal range of motion. She exhibits edema.  Neurological: She is alert and oriented to person, place, and time.  Skin: Skin is warm. No rash noted. No erythema.  Psychiatric: Affect and judgment normal.      LABORATORY PANEL:   CBC  Recent Labs Lab 11/23/16 1024  WBC 5.5  HGB 11.3*  HCT 33.6*  PLT 318   ------------------------------------------------------------------------------------------------------------------  Chemistries   Recent Labs Lab 11/23/16 1024  NA 130*  K 4.0  CL 96*  CO2 23  GLUCOSE 199*  BUN 13  CREATININE 1.27*  CALCIUM 9.2  AST 25  ALT 11*  ALKPHOS 66  BILITOT 0.8   ------------------------------------------------------------------------------------------------------------------  Cardiac Enzymes  Recent Labs Lab 11/23/16 1024  TROPONINI <0.03   ------------------------------------------------------------------------------------------------------------------  RADIOLOGY:  Ct Abdomen Pelvis W Contrast  Result Date: 11/23/2016 CLINICAL DATA:  )Diarrhea and dry heaves with nausea since Saturday. Right-sided abdominal pain that radiates to her back. EXAM: CT ABDOMEN AND PELVIS WITH CONTRAST TECHNIQUE: Multidetector CT imaging of the abdomen and pelvis was performed using the standard protocol following bolus administration of intravenous contrast. CONTRAST:  55mL ISOVUE-300 IOPAMIDOL (ISOVUE-300) INJECTION 61% COMPARISON:  04/09/2016 FINDINGS: Lower chest: 18 mm nodular area of potential chronic atelectasis or  scarring noted right costophrenic sulcus , stable since prior study and present on exam from 11/20/2013, suggesting benign process. Hepatobiliary: No focal abnormality within the liver parenchyma. Gallbladder is distended with 9 mm stone identified in the lumen. Other scattered smaller stones are evident. No intrahepatic or extrahepatic biliary dilation. Pancreas: Pancreas is diffusely atrophic. Spleen: Calcified granulomata. Adrenals/Urinary Tract: No adrenal nodule or mass. 10 mm hypoattenuating lesion lower pole right kidney is stable, likely a tiny cyst. Similar 11 mm lesion upper pole right kidney also likely a cyst. Left kidney unremarkable. No evidence for hydroureter. The urinary bladder appears normal for the degree of distention. Stomach/Bowel: Stomach is nondistended. No gastric wall thickening. No evidence of outlet obstruction. Duodenum is normally positioned as is  the ligament of Treitz. No small bowel wall thickening. No small bowel dilatation. The terminal ileum is normal. The appendix is not visualized, but there is no edema or inflammation in the region of the cecum. No gross colonic mass. No colonic wall thickening. No substantial diverticular change. Vascular/Lymphatic: There is abdominal aortic atherosclerosis without aneurysm. There is no gastrohepatic or hepatoduodenal ligament lymphadenopathy. No intraperitoneal or retroperitoneal lymphadenopathy. No pelvic sidewall lymphadenopathy. Reproductive: Uterus surgically absent. There is no adnexal mass. Small cystic focus right ovary stable since 11/20/2013. Other: No intraperitoneal free fluid. Musculoskeletal: Bone windows reveal no worrisome lytic or sclerotic osseous lesions. Stable appearance small midline ventral hernia containing only fat. IMPRESSION: 1. No acute findings in the abdomen or pelvis. Specifically, no findings to explain the patient's history of abdominal pain. 2. Cholelithiasis with distended gallbladder, but no evidence for  gallbladder wall thickening or pericholecystic fluid. No associated biliary dilatation. 3.  Abdominal Aortic Atherosclerois (ICD10-170.0) 4. Small midline ventral hernia contains only fat. Electronically Signed   By: Misty Stanley M.D.   On: 11/23/2016 13:31    EKG:   none  IMPRESSION AND PLAN:   83 year old female who presents with right upper quadrant abdominal pain, nausea vomiting and diarrhea.   1. Right upper quadrant abdominal pain with nausea and vomiting and gallstones seen on CT of the abdomen. Patient had negative HIDA scan in September. Discussed case with surgery. We'll order right upper quadrant abdominal ultrasound. Pain medications and antiemetics  2. Diarrhea: Check C. difficile and GI panel    3. Hyponatremia from dehydration from nausea, vomiting and diarrhea Start IV fluids and repeat BMP in a.m.  4. Chronic diastolic heart failure with chronic lower extremity edema none exacerbation Due to acute kidney injury will hold diuretics. Monitor intake and output and daily weight  5. Acute kidney injury in the setting of nausea, vomiting and diarrhea Hold ACE inhibitor and diuretic BMP for a.m.  6. Accelerated essential hypertension: Continue hydralazine, metoprolol  7. Diabetes: Continue Levemir at a lower dose due to nausea and vomiting Start sliding scale with ADA diet Diabetes coronary consultation  8. Hyperlipidemia: Continue statin  9. IBS: Continue Bentyl and hycosamine All the records are reviewed and case discussed with ED provider. Management plans discussed with the patient and she is in agreement  CODE STATUS: full  TOTAL TIME TAKING CARE OF THIS PATIENT: 50 minutes.    Alejandrina Raimer M.D on 11/23/2016 at 2:55 PM  Between 7am to 6pm - Pager - [phone]  After 6pm go to [email or web address] - password [email or web address] [place]  Office  [phone]  CC: Primary care physician; Baltazar Apo, MD

## 2016-11-23 NOTE — ED Notes (Signed)
Pt returned from [place]. Assisted to [facility]

## 2016-11-23 NOTE — ED Notes (Signed)
Patient transported to Ultrasound 

## 2016-11-23 NOTE — ED Notes (Signed)
Patient transported to CT 

## 2016-11-24 ENCOUNTER — Inpatient Hospital Stay: Payer: MEDICARE

## 2016-11-24 ENCOUNTER — Other Ambulatory Visit: Payer: Self-pay | Admitting: Radiology

## 2016-11-24 DIAGNOSIS — R109 Unspecified abdominal pain: Secondary | ICD-10-CM | POA: Diagnosis not present

## 2016-11-24 DIAGNOSIS — R1011 Right upper quadrant pain: Secondary | ICD-10-CM | POA: Diagnosis not present

## 2016-11-24 DIAGNOSIS — E860 Dehydration: Secondary | ICD-10-CM | POA: Diagnosis not present

## 2016-11-24 LAB — CBC
HCT: 28.9 % — ABNORMAL LOW (ref 35.0–47.0)
HEMOGLOBIN: 9.7 g/dL — AB (ref 12.0–16.0)
MCH: 29.5 pg (ref 26.0–34.0)
MCHC: 33.7 g/dL (ref 32.0–36.0)
MCV: 87.7 fL (ref 80.0–100.0)
Platelets: 268 10*3/uL (ref 150–440)
RBC: 3.29 MIL/uL — AB (ref 3.80–5.20)
RDW: 14.7 % — ABNORMAL HIGH (ref 11.5–14.5)
WBC: 6.3 10*3/uL (ref 3.6–11.0)

## 2016-11-24 LAB — BASIC METABOLIC PANEL
ANION GAP: 6 (ref 5–15)
BUN: 10 mg/dL (ref 6–20)
CALCIUM: 8.5 mg/dL — AB (ref 8.9–10.3)
CO2: 26 mmol/L (ref 22–32)
Chloride: 102 mmol/L (ref 101–111)
Creatinine, Ser: 1.06 mg/dL — ABNORMAL HIGH (ref 0.44–1.00)
GFR calc Af Amer: 54 mL/min — ABNORMAL LOW (ref >60)
GFR, EST NON AFRICAN AMERICAN: 47 mL/min — AB (ref >60)
Glucose, Bld: 129 mg/dL — ABNORMAL HIGH (ref 65–99)
Potassium: 3.7 mmol/L (ref 3.5–5.1)
SODIUM: 134 mmol/L — AB (ref 135–145)

## 2016-11-24 LAB — GLUCOSE, CAPILLARY
GLUCOSE-CAPILLARY: 112 mg/dL — AB (ref 65–99)
GLUCOSE-CAPILLARY: 141 mg/dL — AB (ref 65–99)
GLUCOSE-CAPILLARY: 182 mg/dL — AB (ref 65–99)
Glucose-Capillary: 214 mg/dL — ABNORMAL HIGH (ref 65–99)

## 2016-11-24 MED ORDER — TECHNETIUM TC 99M MEBROFENIN IV KIT
5.2800 | PACK | Freq: Once | INTRAVENOUS | Status: AC | PRN
  Administered 2016-11-24: 5.28 via INTRAVENOUS

## 2016-11-24 NOTE — Plan of Care (Signed)
Problem: Skin Integrity: Goal: Risk for impaired skin integrity will decrease Outcome: Progressing Patient ambulates to BR

## 2016-11-24 NOTE — Progress Notes (Addendum)
Inpatient Diabetes Program Recommendations  AACE/ADA: New Consensus Statement on Inpatient Glycemic Control (2015)  Target Ranges:  Prepandial:   less than 140 mg/dL      Peak postprandial:   less than 180 mg/dL (1-2 hours)      Critically ill patients:  140 - 180 mg/dL   Lab Results  Component Value Date   GLUCAP 112 (H) 11/24/2016   HGBA1C 7.7 (H) 07/16/2014    Review of Glycemic Control  Results for HAFSA, LOHN (MRN [id number]) as of 11/24/2016 09:16  Ref. Range 11/23/2016 17:39 11/23/2016 21:10 11/24/2016 07:43  Glucose-Capillary Latest Ref Range: 65 - 99 mg/dL 134 (H) 182 (H) 112 (H)    Diabetes history: Type 2 Outpatient Diabetes medications: Novolog 8 units tid, Levemir 18 units qhs Current orders for Inpatient glycemic control: Levemir 9 units qhs, Novolog 0-9 units tid, Novolog 0-5 units qhs  Inpatient Diabetes Program Recommendations:  Consult from Dr. Benjie Karvonen noted.  Since patient is NPO, please consider d/c Novolog 0-5 units qhs and change Novolog 0-9 units tid to q4h.   Gentry Fitz, RN, BA, MHA, CDE Diabetes Coordinator Inpatient Diabetes Program  [phone] (Team Pager) [phone] ([place]) 11/24/2016 9:18 AM

## 2016-11-24 NOTE — Plan of Care (Signed)
Problem: Physical Regulation: Goal: Will remain free from infection Outcome: Progressing Education of handwashing importance to reduce infection

## 2016-11-24 NOTE — Progress Notes (Signed)
Called Diabetes coordinator left vvm. Awaiting response.

## 2016-11-24 NOTE — Progress Notes (Addendum)
11/24/2016  Subjective: No acute events overnight. Patient reports no nausea. Reports improved pain as well. No bowel movements or diarrhea overnight.  Vital signs: Temp:  [98 F (36.7 C)-98.8 F (37.1 C)] 98.8 F (37.1 C) (04/17 0738) Pulse Rate:  [65-88] 65 (04/17 0738) Resp:  [13-22] 17 (04/17 0738) BP: (156-183)/(50-74) 161/56 (04/17 0738) SpO2:  [93 %-100 %] 95 % (04/17 0738) Weight:  [86.6 kg (191 lb)-91 kg (200 lb 11.2 oz)] 91 kg (200 lb 11.2 oz) (04/17 0450)   Intake/Output: 04/16 0701 - 04/17 0700 In: 971.7 [I.V.:971.7] Out: -  Last BM Date: 11/21/16 (per report)  Physical Exam: Constitutional: No acute distress Abdomen:  Soft, nondistended, nontender to palpation  Labs:   Recent Labs  11/23/16 1024 11/24/16 0413  WBC 5.5 6.3  HGB 11.3* 9.7*  HCT 33.6* 28.9*  PLT 318 268    Recent Labs  11/23/16 1024 11/24/16 0413  NA 130* 134*  K 4.0 3.7  CL 96* 102  CO2 23 26  GLUCOSE 199* 129*  BUN 13 10  CREATININE 1.27* 1.06*  CALCIUM 9.2 8.5*   No results for input(s): LABPROT, INR in the last 72 hours.  Imaging: Ct Abdomen Pelvis W Contrast  Result Date: 11/23/2016 CLINICAL DATA:  )Diarrhea and dry heaves with nausea since Saturday. Right-sided abdominal pain that radiates to her back. EXAM: CT ABDOMEN AND PELVIS WITH CONTRAST TECHNIQUE: Multidetector CT imaging of the abdomen and pelvis was performed using the standard protocol following bolus administration of intravenous contrast. CONTRAST:  6mL ISOVUE-300 IOPAMIDOL (ISOVUE-300) INJECTION 61% COMPARISON:  04/09/2016 FINDINGS: Lower chest: 18 mm nodular area of potential chronic atelectasis or scarring noted right costophrenic sulcus , stable since prior study and present on exam from 11/20/2013, suggesting benign process. Hepatobiliary: No focal abnormality within the liver parenchyma. Gallbladder is distended with 9 mm stone identified in the lumen. Other scattered smaller stones are evident. No intrahepatic  or extrahepatic biliary dilation. Pancreas: Pancreas is diffusely atrophic. Spleen: Calcified granulomata. Adrenals/Urinary Tract: No adrenal nodule or mass. 10 mm hypoattenuating lesion lower pole right kidney is stable, likely a tiny cyst. Similar 11 mm lesion upper pole right kidney also likely a cyst. Left kidney unremarkable. No evidence for hydroureter. The urinary bladder appears normal for the degree of distention. Stomach/Bowel: Stomach is nondistended. No gastric wall thickening. No evidence of outlet obstruction. Duodenum is normally positioned as is the ligament of Treitz. No small bowel wall thickening. No small bowel dilatation. The terminal ileum is normal. The appendix is not visualized, but there is no edema or inflammation in the region of the cecum. No gross colonic mass. No colonic wall thickening. No substantial diverticular change. Vascular/Lymphatic: There is abdominal aortic atherosclerosis without aneurysm. There is no gastrohepatic or hepatoduodenal ligament lymphadenopathy. No intraperitoneal or retroperitoneal lymphadenopathy. No pelvic sidewall lymphadenopathy. Reproductive: Uterus surgically absent. There is no adnexal mass. Small cystic focus right ovary stable since 11/20/2013. Other: No intraperitoneal free fluid. Musculoskeletal: Bone windows reveal no worrisome lytic or sclerotic osseous lesions. Stable appearance small midline ventral hernia containing only fat. IMPRESSION: 1. No acute findings in the abdomen or pelvis. Specifically, no findings to explain the patient's history of abdominal pain. 2. Cholelithiasis with distended gallbladder, but no evidence for gallbladder wall thickening or pericholecystic fluid. No associated biliary dilatation. 3.  Abdominal Aortic Atherosclerois (ICD10-170.0) 4. Small midline ventral hernia contains only fat. Electronically Signed   By: Misty Stanley M.D.   On: 11/23/2016 13:31   US Abdomen Limited Ruq  Result Date: 11/23/2016 CLINICAL  DATA:  Gallstones, right upper quadrant pain for 3 days. EXAM: US ABDOMEN LIMITED - RIGHT UPPER QUADRANT COMPARISON:  CT abdomen pelvis 11/23/2016. Abdominal ultrasound 04/12/2016. FINDINGS: Gallbladder: Appears distended with shadowing echogenic stones measuring up to 1.4 cm. Gallbladder wall is within normal limits, 3 mm. Negative sonographic Murphy sign. Common bile duct: Diameter: 3 mm, within normal limits. Liver: Echogenicity is diffusely increased. IMPRESSION: 1. Cholelithiasis without evidence of acute cholecystitis. 2. Liver appears fatty. Electronically Signed   By: Lorin Picket M.D.   On: 11/23/2016 16:06    Assessment/Plan: 83 year old female presenting with abdominal pain.  -Currently patient has no abdominal pain and has no further nausea. She has not had any diarrhea so still has not been sent for any studies yet. Low suspicion for cholecystitis.  WBC has also remained normal despite of no antibiotics. -Patient will have a HIDA scan today to look for any potential biliary etiology to her pain.   Melvyn Neth, [place]

## 2016-11-24 NOTE — Progress Notes (Signed)
Enteric precautions maintained, no stool this shift.

## 2016-11-24 NOTE — Progress Notes (Signed)
Patient away for HIDA scan, spoke to nursing , no diarrhea or abdominal pain today. Discussed with Dr Anselm Jungling, if consult required will be happy to see her tomorrow morning .   Dr Jonathon Bellows  Gastroenterology/Hepatology Pager: [phone]

## 2016-11-25 ENCOUNTER — Inpatient Hospital Stay: Payer: MEDICARE

## 2016-11-25 DIAGNOSIS — E860 Dehydration: Secondary | ICD-10-CM | POA: Diagnosis not present

## 2016-11-25 DIAGNOSIS — R109 Unspecified abdominal pain: Secondary | ICD-10-CM | POA: Diagnosis not present

## 2016-11-25 DIAGNOSIS — R1011 Right upper quadrant pain: Secondary | ICD-10-CM | POA: Diagnosis not present

## 2016-11-25 LAB — GLUCOSE, CAPILLARY
GLUCOSE-CAPILLARY: 122 mg/dL — AB (ref 65–99)
Glucose-Capillary: 177 mg/dL — ABNORMAL HIGH (ref 65–99)

## 2016-11-25 NOTE — Progress Notes (Signed)
Patient was discharged home with daughter. IV removed with cath intact. Reviewed meds and last dose taken. Allowed time for questions. Follow-up appointments reviewed.

## 2016-11-25 NOTE — Progress Notes (Signed)
11/25/2016  Subjective: No acute events.  HIDA scan done yesterday showing patent cystic duct and CBD.  Had lower EF, but otherwise no other issues.  Did not have any pain during the scan.  Advanced to full liquids which she tolerated.  Denies any pain today.  Vital signs: Temp:  [99.2 F (37.3 C)-99.6 F (37.6 C)] 99.6 F (37.6 C) (04/17 2326) Pulse Rate:  [63-73] 63 (04/17 2326) Resp:  [16-18] 18 (04/17 2326) BP: (134-139)/(42-47) 139/42 (04/17 2326) SpO2:  [94 %-95 %] 95 % (04/17 2326)   Intake/Output: 04/17 0701 - 04/18 0700 In: 633.3 [I.V.:633.3] Out: -  Last BM Date: 11/21/16  Physical Exam: Constitutional: No acute distress Abdomen:  Soft, nondistended, nontender to palpation.  Labs:   Recent Labs  11/23/16 1024 11/24/16 0413  WBC 5.5 6.3  HGB 11.3* 9.7*  HCT 33.6* 28.9*  PLT 318 268    Recent Labs  11/23/16 1024 11/24/16 0413  NA 130* 134*  K 4.0 3.7  CL 96* 102  CO2 23 26  GLUCOSE 199* 129*  BUN 13 10  CREATININE 1.27* 1.06*  CALCIUM 9.2 8.5*   No results for input(s): LABPROT, INR in the last 72 hours.  Imaging: Nm Hepato W/eject Fract  Result Date: 11/24/2016 CLINICAL DATA:  Abdominal pain, right upper quadrant EXAM: NUCLEAR MEDICINE HEPATOBILIARY IMAGING WITH GALLBLADDER EF TECHNIQUE: Sequential images of the abdomen were obtained out to 60 minutes following intravenous administration of radiopharmaceutical. After oral ingestion of Ensure, gallbladder ejection fraction was determined. At 60 min, normal ejection fraction is greater than 33%. RADIOPHARMACEUTICALS:  5.28 mCi Tc-33m  Choletec IV COMPARISON:  Sonography from yesterday FINDINGS: Prompt uptake and biliary excretion of activity by the liver is seen. Gallbladder activity is visualized, consistent with patency of cystic duct. Biliary activity passes into small bowel, consistent with patent common bile duct. Calculated gallbladder ejection fraction is 24%. (Normal gallbladder ejection fraction  with Ensure is greater than 33%.) IMPRESSION: 1. Patent cystic and common bile ducts. 2. Low ejection fraction of 24%. Electronically Signed   By: Monte Fantasia M.D.   On: 11/24/2016 16:28    Assessment/Plan: 83 yo female with abdominal pain.  --Have advanced diet to Diabetic / Heart healthy diet.   --No acute surgical needs at this time.  No need for surgical procedures given HIDA scan findings.  Uncertain that the patient's pain is due to low gallbladder EF.  Patient may follow up in clinic with [place] on an as needed basis.  Given her age and comorbidities, she's not a great surgical candidate unless truly necessary. --Please feel free to call or consult with any questions or concerns.   Melvyn Neth, [place] 7a-7p: [place] 7p-7a: [phone]

## 2016-11-25 NOTE — Progress Notes (Signed)
[facility] NAME: Kristen Barron    MR#:  [id number]  DATE OF BIRTH:  24-Apr-1929  SUBJECTIVE:  CHIEF COMPLAINT:   Chief Complaint  Patient presents with  . Emesis  . Diarrhea    Came with c/o nausea and abdominal pain, she was checked by colonoscopy in sept- with probable diagnosis of IBS.   On CT abd cholylithiasis with no distension of bladder. Waiting for HIDA scan.    REVIEW OF SYSTEMS:  CONSTITUTIONAL: No fever, fatigue or weakness.  EYES: No blurred or double vision.  EARS, NOSE, AND THROAT: No tinnitus or ear pain.  RESPIRATORY: No cough, shortness of breath, wheezing or hemoptysis.  CARDIOVASCULAR: No chest pain, orthopnea, edema.  GASTROINTESTINAL: positive for nausea, vomiting, diarrhea or abdominal pain.  GENITOURINARY: No dysuria, hematuria.  ENDOCRINE: No polyuria, nocturia,  HEMATOLOGY: No anemia, easy bruising or bleeding SKIN: No rash or lesion. MUSCULOSKELETAL: No joint pain or arthritis.   NEUROLOGIC: No tingling, numbness, weakness.  PSYCHIATRY: No anxiety or depression.   ROS  DRUG ALLERGIES:  No Known Allergies  VITALS:  Blood pressure (!) 139/42, pulse 63, temperature 99.6 F (37.6 C), temperature source Oral, resp. rate 18, height 5\' 1"  (1.549 m), weight 91 kg (200 lb 11.2 oz), SpO2 95 %.  PHYSICAL EXAMINATION:  GENERAL:  83 y.o.-year-old patient lying in the bed with no acute distress.  EYES: Pupils equal, round, reactive to light and accommodation. No scleral icterus. Extraocular muscles intact.  HEENT: Head atraumatic, normocephalic. Oropharynx and nasopharynx clear.  NECK:  Supple, no jugular venous distention. No thyroid enlargement, no tenderness.  LUNGS: Normal breath sounds bilaterally, no wheezing, rales,rhonchi or crepitation. No use of accessory muscles of respiration.  CARDIOVASCULAR: S1, S2 normal. No murmurs, rubs, or gallops.  ABDOMEN: Soft, mild tender, nondistended. Bowel sounds  present. No organomegaly or mass.  EXTREMITIES: No pedal edema, cyanosis, or clubbing.  NEUROLOGIC: Cranial nerves II through XII are intact. Muscle strength 4/5 in all extremities. Sensation intact. Gait not checked.  PSYCHIATRIC: The patient is alert and oriented x 3.  SKIN: No obvious rash, lesion, or ulcer.   Physical Exam LABORATORY PANEL:   CBC  Recent Labs Lab 11/24/16 0413  WBC 6.3  HGB 9.7*  HCT 28.9*  PLT 268   ------------------------------------------------------------------------------------------------------------------  Chemistries   Recent Labs Lab 11/23/16 1024 11/24/16 0413  NA 130* 134*  K 4.0 3.7  CL 96* 102  CO2 23 26  GLUCOSE 199* 129*  BUN 13 10  CREATININE 1.27* 1.06*  CALCIUM 9.2 8.5*  AST 25  --   ALT 11*  --   ALKPHOS 66  --   BILITOT 0.8  --    ------------------------------------------------------------------------------------------------------------------  Cardiac Enzymes  Recent Labs Lab 11/23/16 1024  TROPONINI <0.03   ------------------------------------------------------------------------------------------------------------------  RADIOLOGY:  Ct Abdomen Pelvis W Contrast  Result Date: 11/23/2016 CLINICAL DATA:  )Diarrhea and dry heaves with nausea since Saturday. Right-sided abdominal pain that radiates to her back. EXAM: CT ABDOMEN AND PELVIS WITH CONTRAST TECHNIQUE: Multidetector CT imaging of the abdomen and pelvis was performed using the standard protocol following bolus administration of intravenous contrast. CONTRAST:  22mL ISOVUE-300 IOPAMIDOL (ISOVUE-300) INJECTION 61% COMPARISON:  04/09/2016 FINDINGS: Lower chest: 18 mm nodular area of potential chronic atelectasis or scarring noted right costophrenic sulcus , stable since prior study and present on exam from 11/20/2013, suggesting benign process. Hepatobiliary: No focal abnormality within the liver parenchyma. Gallbladder is distended with 9 mm stone  identified in the  lumen. Other scattered smaller stones are evident. No intrahepatic or extrahepatic biliary dilation. Pancreas: Pancreas is diffusely atrophic. Spleen: Calcified granulomata. Adrenals/Urinary Tract: No adrenal nodule or mass. 10 mm hypoattenuating lesion lower pole right kidney is stable, likely a tiny cyst. Similar 11 mm lesion upper pole right kidney also likely a cyst. Left kidney unremarkable. No evidence for hydroureter. The urinary bladder appears normal for the degree of distention. Stomach/Bowel: Stomach is nondistended. No gastric wall thickening. No evidence of outlet obstruction. Duodenum is normally positioned as is the ligament of Treitz. No small bowel wall thickening. No small bowel dilatation. The terminal ileum is normal. The appendix is not visualized, but there is no edema or inflammation in the region of the cecum. No gross colonic mass. No colonic wall thickening. No substantial diverticular change. Vascular/Lymphatic: There is abdominal aortic atherosclerosis without aneurysm. There is no gastrohepatic or hepatoduodenal ligament lymphadenopathy. No intraperitoneal or retroperitoneal lymphadenopathy. No pelvic sidewall lymphadenopathy. Reproductive: Uterus surgically absent. There is no adnexal mass. Small cystic focus right ovary stable since 11/20/2013. Other: No intraperitoneal free fluid. Musculoskeletal: Bone windows reveal no worrisome lytic or sclerotic osseous lesions. Stable appearance small midline ventral hernia containing only fat. IMPRESSION: 1. No acute findings in the abdomen or pelvis. Specifically, no findings to explain the patient's history of abdominal pain. 2. Cholelithiasis with distended gallbladder, but no evidence for gallbladder wall thickening or pericholecystic fluid. No associated biliary dilatation. 3.  Abdominal Aortic Atherosclerois (ICD10-170.0) 4. Small midline ventral hernia contains only fat. Electronically Signed   By: Misty Stanley M.D.   On: 11/23/2016 13:31    Nm Hepato W/eject Fract  Result Date: 11/24/2016 CLINICAL DATA:  Abdominal pain, right upper quadrant EXAM: NUCLEAR MEDICINE HEPATOBILIARY IMAGING WITH GALLBLADDER EF TECHNIQUE: Sequential images of the abdomen were obtained out to 60 minutes following intravenous administration of radiopharmaceutical. After oral ingestion of Ensure, gallbladder ejection fraction was determined. At 60 min, normal ejection fraction is greater than 33%. RADIOPHARMACEUTICALS:  5.28 mCi Tc-19m  Choletec IV COMPARISON:  Sonography from yesterday FINDINGS: Prompt uptake and biliary excretion of activity by the liver is seen. Gallbladder activity is visualized, consistent with patency of cystic duct. Biliary activity passes into small bowel, consistent with patent common bile duct. Calculated gallbladder ejection fraction is 24%. (Normal gallbladder ejection fraction with Ensure is greater than 33%.) IMPRESSION: 1. Patent cystic and common bile ducts. 2. Low ejection fraction of 24%. Electronically Signed   By: Monte Fantasia M.D.   On: 11/24/2016 16:28   US Abdomen Limited Ruq  Result Date: 11/23/2016 CLINICAL DATA:  Gallstones, right upper quadrant pain for 3 days. EXAM: US ABDOMEN LIMITED - RIGHT UPPER QUADRANT COMPARISON:  CT abdomen pelvis 11/23/2016. Abdominal ultrasound 04/12/2016. FINDINGS: Gallbladder: Appears distended with shadowing echogenic stones measuring up to 1.4 cm. Gallbladder wall is within normal limits, 3 mm. Negative sonographic Murphy sign. Common bile duct: Diameter: 3 mm, within normal limits. Liver: Echogenicity is diffusely increased. IMPRESSION: 1. Cholelithiasis without evidence of acute cholecystitis. 2. Liver appears fatty. Electronically Signed   By: Lorin Picket M.D.   On: 11/23/2016 16:06    ASSESSMENT AND PLAN:   Active Problems:   Abdominal pain  83 year old female who presents with right upper quadrant abdominal pain, nausea vomiting and diarrhea.   1. Right upper quadrant  abdominal pain with nausea and vomiting and gallstones seen on CT of the abdomen. Patient had negative HIDA scan in September. Discussed case with surgery. Same findings on  right upper quadrant abdominal ultrasound. Pain medications and antiemetics  HIDA scan to decide further plan, also GI consult is called in.  2. Diarrhea: Check C. difficile and GI panel    She did not have BM in last 2-3 days.    Likely due to IBS- have alternating diarrhea with constipation.   3. Hyponatremia from dehydration from nausea, vomiting and diarrhea  IV fluids and repeat BMP in a.m.  4. Chronic diastolic heart failure with chronic lower extremity edema none exacerbation Due to acute kidney injury will hold diuretics. Monitor intake and output and daily weight  5. Acute kidney injury in the setting of nausea, vomiting and diarrhea Hold ACE inhibitor and diuretic BMP for a.m., improved.  6. Accelerated essential hypertension: Continue hydralazine, metoprolol- improved control.  7. Diabetes: Continue Levemir at a lower dose due to nausea and vomiting Start sliding scale with ADA diet Diabetes coronary consultation  8. Hyperlipidemia: Continue statin  9. IBS: Continue Bentyl and hycosamine   GI to see the pt.   All the records are reviewed and case discussed with Care Management/Social Workerr. Management plans discussed with the patient, family and they are in agreement.  CODE STATUS: Full.  TOTAL TIME TAKING CARE OF THIS PATIENT: 35 minutes.   Daughter present in room.  POSSIBLE D/C IN 1-2 DAYS, DEPENDING ON CLINICAL CONDITION.   Vaughan Basta M.D on 11/25/2016   Between 7am to 6pm - Pager - [phone]  After 6pm go to [email or web address] - password [email or web address] [facility]  Office  [phone]  CC: Primary care physician; Baltazar Apo, MD  Note: This dictation was prepared with Dragon dictation along with smaller phrase technology. Any  transcriptional errors that result from this process are unintentional.

## 2016-11-25 NOTE — Discharge Summary (Signed)
[facility] NAME: Kristen Barron    MR#:  [id number]  DATE OF BIRTH:  10-Oct-1928  DATE OF ADMISSION:  11/23/2016 ADMITTING PHYSICIAN: Bettey Costa, MD  DATE OF DISCHARGE: 11/25/2016  PRIMARY CARE PHYSICIAN: Baltazar Apo, MD    ADMISSION DIAGNOSIS:  Dehydration [E86.0] Gastroenteritis [K52.9] Gallstones [K80.20] Abdominal pain [R10.9]  DISCHARGE DIAGNOSIS:  Active Problems:   Abdominal pain   Gastroenteritis.  SECONDARY DIAGNOSIS:   Past Medical History:  Diagnosis Date  . Arthritis   . Breast cancer ([place]) 03/06/2014   right breast with mastectomy and chemo  . Cancer [facility])    Reports lumpectomy for a cyst  . CHF (congestive heart failure) ([place])   . Collagen vascular disease ([place])   . Diabetes ([place])   . DVT (deep venous thrombosis) ([place])   . Hemorrhoids   . Hypertension   . Renal disorder   . Renal insufficiency   . Stroke [facility])    August 2014, but has had strokes prior as well    HOSPITAL COURSE:   83 year old female who presents with right upper quadrant abdominal pain, nausea vomiting and diarrhea.   1. Right upper quadrant abdominal pain with nausea and vomiting and gallstones seen on CT of the abdomen. Patient had negative HIDA scan in September. Discussed case with surgery. Same findings on right upper quadrant abdominal ultrasound. Pain medications and antiemetics  HIDA scan Done- shows some slow emptying of bladder, but no need for surgery.  pt does not have any pain, and tolerating diet now- so GI does not need to see her.  2. Diarrhea: Check C. difficile and GI panel    She did not have BM in last 2-3 days.    Likely due to IBS- have alternating diarrhea with constipation.  had 2 Bm yesterday with incontinence. This is chronic.  3. Hyponatremia from dehydration from nausea, vomiting and diarrhea  IV fluids and repeat BMP in a.m.  4. Chronic diastolic heart failure with chronic lower  extremity edema none exacerbation Due to acute kidney injury will hold diuretics. Monitor intake and output and daily weight  5. Acute kidney injury in the setting of nausea, vomiting and diarrhea Hold ACE inhibitor and diuretic BMP for a.m., improved.  6. Accelerated essential hypertension: Continue hydralazine, metoprolol- improved control.  7. Diabetes: Continue Levemir at a lower dose due to nausea and vomiting Start sliding scale with ADA diet Diabetes coronary consultation  8. Hyperlipidemia: Continue statin  9. IBS: Continue Bentyl and hycosamine   GI to see the pt.  10. Complain of double vision   Today morning- lasted for 30 min, no other complains.   She had similar problems at home also.   Get MRI brain.   She is already on plavix, statin.    Advised to follow with PMD/ DISCHARGE CONDITIONS:   Stable.  CONSULTS OBTAINED:  Treatment Team:  Jonathon Bellows, MD  DRUG ALLERGIES:  No Known Allergies  DISCHARGE MEDICATIONS:   Current Discharge Medication List    CONTINUE these medications which have NOT CHANGED   Details  acetaminophen (TYLENOL) 500 MG tablet Take 500 mg by mouth every 6 (six) hours as needed.    benazepril (LOTENSIN) 20 MG tablet Take 20 mg by mouth daily.    clopidogrel (PLAVIX) 75 MG tablet Take 75 mg by mouth daily with breakfast.    dicyclomine (BENTYL) 20 MG tablet Take 1 tablet (20 mg total) by mouth 4 (four) times  daily -  before meals and at bedtime. Qty: 120 tablet, Refills: 0    gabapentin (NEURONTIN) 300 MG capsule Take 600 mg by mouth 3 (three) times daily.     Glucosamine-Chondroit-Vit C-Mn (GLUCOSAMINE 1500 COMPLEX PO) Take by mouth 3 (three) times daily.    hydrALAZINE (APRESOLINE) 50 MG tablet Take 50 mg by mouth 3 (three) times daily.     HYDROcodone-acetaminophen (NORCO/VICODIN) 5-325 MG per tablet Take 1 tablet by mouth every 6 (six) hours as needed.     hyoscyamine (LEVSIN SL) 0.125 MG SL tablet Place 1 tablet  (0.125 mg total) under the tongue every 6 (six) hours as needed for cramping. Qty: 30 tablet, Refills: 0    insulin aspart (NOVOLOG) 100 UNIT/ML injection Inject 8 Units into the skin 3 (three) times daily before meals.    insulin detemir (LEVEMIR) 100 UNIT/ML injection Inject 18 Units into the skin at bedtime.    lisinopril (PRINIVIL,ZESTRIL) 20 MG tablet Take 1 tablet (20 mg total) by mouth daily. Qty: 30 tablet, Refills: 0    magnesium oxide (MAG-OX) 400 MG tablet Take 400 mg by mouth 2 (two) times daily.    Melatonin 5 MG TABS Take 5 mg by mouth at bedtime.    metoprolol succinate (TOPROL-XL) 100 MG 24 hr tablet Take 100 mg by mouth daily. Take with or immediately following a meal.    Omega 3 1200 MG CAPS Take by mouth 2 (two) times daily.    ondansetron (ZOFRAN) 4 MG tablet Take 4 mg by mouth every 8 (eight) hours as needed for nausea or vomiting.    pantoprazole (PROTONIX) 40 MG tablet Take 1 tablet (40 mg total) by mouth 2 (two) times daily before a meal. Qty: 60 tablet, Refills: 0    polyethylene glycol (MIRALAX / GLYCOLAX) packet Take 17 g by mouth daily. Qty: 30 each, Refills: 0    pravastatin (PRAVACHOL) 80 MG tablet Take 80 mg by mouth every evening.     torsemide (DEMADEX) 20 MG tablet Take 20 mg by mouth daily.     traMADol (ULTRAM) 50 MG tablet Take 1 tablet (50 mg total) by mouth every 12 (twelve) hours as needed for severe pain. Qty: 15 tablet, Refills: 0      STOP taking these medications     potassium chloride SA (K-DUR,KLOR-CON) 20 MEQ tablet          DISCHARGE INSTRUCTIONS:    Follow with PMD in 1-2 weeks.  If you experience worsening of your admission symptoms, develop shortness of breath, life threatening emergency, suicidal or homicidal thoughts you must seek medical attention immediately by calling 911 or calling your MD immediately  if symptoms less severe.  You Must read complete instructions/literature along with all the possible adverse  reactions/side effects for all the Medicines you take and that have been prescribed to you. Take any new Medicines after you have completely understood and accept all the possible adverse reactions/side effects.   Please note  You were cared for by a hospitalist during your hospital stay. If you have any questions about your discharge medications or the care you received while you were in the hospital after you are discharged, you can call the unit and asked to speak with the hospitalist on call if the hospitalist that took care of you is not available. Once you are discharged, your primary care physician will handle any further medical issues. Please note that NO REFILLS for any discharge medications will be authorized once you are  discharged, as it is imperative that you return to your primary care physician (or establish a relationship with a primary care physician if you do not have one) for your aftercare needs so that they can reassess your need for medications and monitor your lab values.    Today   CHIEF COMPLAINT:   Chief Complaint  Patient presents with  . Emesis  . Diarrhea    HISTORY OF PRESENT ILLNESS:  Kristen Barron  is a 83 y.o. female with a known history of Diastolic heart failure, diabetes and hypertension who presents with above complaint. Patient reports over the past week she has had nausea, vomiting and diarrhea. She reports that her diarrhea and nausea have worsened. She reports that she is having right upper quadrant abdominal pain which is currently a 5 out of 10. Morphine did subside the pain. She was hospitalized in September for abdominal pain with nausea and vomiting. At that time she had colonoscopy which showed colonic spasms consistent with irritable bowel syndrome and she was started on that until and high close to mean. She also had a HIDA scan due to gallstones which was negative.  CT of the abdomen today shows gallstones but no acute pathology.   VITAL SIGNS:   Blood pressure (!) 139/42, pulse 63, temperature 99.6 F (37.6 C), temperature source Oral, resp. rate 18, height 5\' 1"  (1.549 m), weight 91 kg (200 lb 11.2 oz), SpO2 95 %.  I/O:  No intake or output data in the 24 hours ending 11/25/16 1116  PHYSICAL EXAMINATION:  GENERAL:  83 y.o.-year-old patient lying in the bed with no acute distress.  EYES: Pupils equal, round, reactive to light and accommodation. No scleral icterus. Extraocular muscles intact.  HEENT: Head atraumatic, normocephalic. Oropharynx and nasopharynx clear.  NECK:  Supple, no jugular venous distention. No thyroid enlargement, no tenderness.  LUNGS: Normal breath sounds bilaterally, no wheezing, rales,rhonchi or crepitation. No use of accessory muscles of respiration.  CARDIOVASCULAR: S1, S2 normal. No murmurs, rubs, or gallops.  ABDOMEN: Soft, non-tender, non-distended. Bowel sounds present. No organomegaly or mass.  EXTREMITIES: No pedal edema, cyanosis, or clubbing.  NEUROLOGIC: Cranial nerves II through XII are intact. Muscle strength 5/5 in all extremities. Sensation intact. Gait not checked.  PSYCHIATRIC: The patient is alert and oriented x 3.  SKIN: No obvious rash, lesion, or ulcer.   DATA REVIEW:   CBC  Recent Labs Lab 11/24/16 0413  WBC 6.3  HGB 9.7*  HCT 28.9*  PLT 268    Chemistries   Recent Labs Lab 11/23/16 1024 11/24/16 0413  NA 130* 134*  K 4.0 3.7  CL 96* 102  CO2 23 26  GLUCOSE 199* 129*  BUN 13 10  CREATININE 1.27* 1.06*  CALCIUM 9.2 8.5*  AST 25  --   ALT 11*  --   ALKPHOS 66  --   BILITOT 0.8  --     Cardiac Enzymes  Recent Labs Lab 11/23/16 1024  TROPONINI <0.03    Microbiology Results  Results for orders placed or performed in visit on 12/13/14  Urine culture     Status: None   Collection Time: 12/13/14 11:26 AM  Result Value Ref Range Status   Specimen Description URINE, CLEAN CATCH  Final   Special Requests NONE  Final   Report Status 12/22/2014 FINAL  Final     RADIOLOGY:  Ct Abdomen Pelvis W Contrast  Result Date: 11/23/2016 CLINICAL DATA:  )Diarrhea and dry heaves with nausea since Saturday. Right-sided  abdominal pain that radiates to her back. EXAM: CT ABDOMEN AND PELVIS WITH CONTRAST TECHNIQUE: Multidetector CT imaging of the abdomen and pelvis was performed using the standard protocol following bolus administration of intravenous contrast. CONTRAST:  34mL ISOVUE-300 IOPAMIDOL (ISOVUE-300) INJECTION 61% COMPARISON:  04/09/2016 FINDINGS: Lower chest: 18 mm nodular area of potential chronic atelectasis or scarring noted right costophrenic sulcus , stable since prior study and present on exam from 11/20/2013, suggesting benign process. Hepatobiliary: No focal abnormality within the liver parenchyma. Gallbladder is distended with 9 mm stone identified in the lumen. Other scattered smaller stones are evident. No intrahepatic or extrahepatic biliary dilation. Pancreas: Pancreas is diffusely atrophic. Spleen: Calcified granulomata. Adrenals/Urinary Tract: No adrenal nodule or mass. 10 mm hypoattenuating lesion lower pole right kidney is stable, likely a tiny cyst. Similar 11 mm lesion upper pole right kidney also likely a cyst. Left kidney unremarkable. No evidence for hydroureter. The urinary bladder appears normal for the degree of distention. Stomach/Bowel: Stomach is nondistended. No gastric wall thickening. No evidence of outlet obstruction. Duodenum is normally positioned as is the ligament of Treitz. No small bowel wall thickening. No small bowel dilatation. The terminal ileum is normal. The appendix is not visualized, but there is no edema or inflammation in the region of the cecum. No gross colonic mass. No colonic wall thickening. No substantial diverticular change. Vascular/Lymphatic: There is abdominal aortic atherosclerosis without aneurysm. There is no gastrohepatic or hepatoduodenal ligament lymphadenopathy. No intraperitoneal or retroperitoneal  lymphadenopathy. No pelvic sidewall lymphadenopathy. Reproductive: Uterus surgically absent. There is no adnexal mass. Small cystic focus right ovary stable since 11/20/2013. Other: No intraperitoneal free fluid. Musculoskeletal: Bone windows reveal no worrisome lytic or sclerotic osseous lesions. Stable appearance small midline ventral hernia containing only fat. IMPRESSION: 1. No acute findings in the abdomen or pelvis. Specifically, no findings to explain the patient's history of abdominal pain. 2. Cholelithiasis with distended gallbladder, but no evidence for gallbladder wall thickening or pericholecystic fluid. No associated biliary dilatation. 3.  Abdominal Aortic Atherosclerois (ICD10-170.0) 4. Small midline ventral hernia contains only fat. Electronically Signed   By: Misty Stanley M.D.   On: 11/23/2016 13:31   Nm Hepato W/eject Fract  Result Date: 11/24/2016 CLINICAL DATA:  Abdominal pain, right upper quadrant EXAM: NUCLEAR MEDICINE HEPATOBILIARY IMAGING WITH GALLBLADDER EF TECHNIQUE: Sequential images of the abdomen were obtained out to 60 minutes following intravenous administration of radiopharmaceutical. After oral ingestion of Ensure, gallbladder ejection fraction was determined. At 60 min, normal ejection fraction is greater than 33%. RADIOPHARMACEUTICALS:  5.28 mCi Tc-51m  Choletec IV COMPARISON:  Sonography from yesterday FINDINGS: Prompt uptake and biliary excretion of activity by the liver is seen. Gallbladder activity is visualized, consistent with patency of cystic duct. Biliary activity passes into small bowel, consistent with patent common bile duct. Calculated gallbladder ejection fraction is 24%. (Normal gallbladder ejection fraction with Ensure is greater than 33%.) IMPRESSION: 1. Patent cystic and common bile ducts. 2. Low ejection fraction of 24%. Electronically Signed   By: Monte Fantasia M.D.   On: 11/24/2016 16:28   US Abdomen Limited Ruq  Result Date: 11/23/2016 CLINICAL  DATA:  Gallstones, right upper quadrant pain for 3 days. EXAM: US ABDOMEN LIMITED - RIGHT UPPER QUADRANT COMPARISON:  CT abdomen pelvis 11/23/2016. Abdominal ultrasound 04/12/2016. FINDINGS: Gallbladder: Appears distended with shadowing echogenic stones measuring up to 1.4 cm. Gallbladder wall is within normal limits, 3 mm. Negative sonographic Murphy sign. Common bile duct: Diameter: 3 mm, within normal limits. Liver: Echogenicity is  diffusely increased. IMPRESSION: 1. Cholelithiasis without evidence of acute cholecystitis. 2. Liver appears fatty. Electronically Signed   By: Lorin Picket M.D.   On: 11/23/2016 16:06    EKG:   Orders placed or performed during the hospital encounter of 04/12/16  . EKG 12-Lead  . EKG 12-Lead  . ED EKG  . ED EKG      Management plans discussed with the patient, family and they are in agreement.  CODE STATUS:     Code Status Orders        Start     Ordered   11/23/16 1727  Full code  Continuous     11/23/16 1726    Code Status History    Date Active Date Inactive Code Status Order ID Comments User Context   04/13/2016  1:12 AM 04/16/2016  5:49 PM Full Code [id number]  Harvie Bridge, DO Inpatient   04/09/2016  5:44 AM 04/09/2016  5:32 PM Full Code [id number]  Saundra Shelling, MD Inpatient   07/24/2013  2:19 PM 07/26/2013  8:38 PM Full Code [id number]  Charlie Pitter, MD Inpatient   07/20/2013  1:36 PM 07/24/2013  2:19 PM Full Code [id number]  Othella Boyer, MD Inpatient      TOTAL TIME TAKING CARE OF THIS PATIENT: 35 minutes.    Vaughan Basta M.D on 11/25/2016 at 11:16 AM  Between 7am to 6pm - Pager - [phone]  After 6pm go to [email or web address] - password [email or web address] [facility]  Office  [phone]  CC: Primary care physician; Baltazar Apo, MD   Note: This dictation was prepared with Dragon dictation along with smaller phrase technology. Any transcriptional errors that result from this process are unintentional.

## 2016-12-01 ENCOUNTER — Other Ambulatory Visit: Payer: Medicare HMO

## 2016-12-01 ENCOUNTER — Ambulatory Visit: Payer: Medicare HMO | Attending: Surgery

## 2016-12-29 ENCOUNTER — Encounter: Payer: Self-pay | Admitting: Gastroenterology

## 2016-12-29 ENCOUNTER — Ambulatory Visit (INDEPENDENT_AMBULATORY_CARE_PROVIDER_SITE_OTHER): Payer: Medicare HMO | Admitting: Gastroenterology

## 2016-12-29 VITALS — BP 151/62 | HR 94 | Temp 98.3°F | Ht 61.0 in | Wt 191.8 lb

## 2016-12-29 DIAGNOSIS — R197 Diarrhea, unspecified: Secondary | ICD-10-CM | POA: Diagnosis not present

## 2016-12-29 NOTE — Progress Notes (Signed)
Gastroenterology Consultation  Referring Provider:     Denton Lank, MD Primary Care Physician:  Denton Lank, MD Primary Gastroenterologist:  Dr. Jonathon Bellows  Reason for Consultation:     Abdominal pain, diarrhea         HPI:   Kristen Barron is a 83 y.o. y/o female referred for consultation & management  by Dr. Denton Lank, MD.    She is here today to see me after her hospital discharge. She was discharged form the hospital in 11/2016 when she was admitted with abdominal pain, vomiting and diarrhea. At that time of discharge she had none of these symptoms. She did undergo a HIDA scan which showed a low EF of 24 % . RUQ USG showed chololithiasis, fatty liver . CT abdomen no acute abnormalities. LFT's normal.  04/2016- normal colonoscopy . Since hospital discharge she says that she has issues with diarrhea on and off. Says she alternates between constipation and diarrhea, ongoing since 8-9 months, no change . No abdominal pain. She has mostly 1-2 bowel movements a day , has diarrhea 1-2 times a week, watery -had an episode of incontinence in the night on one occasion, usually in the day , usually after meals, usually 3-45 mins after meals.   She does consume artificial sugars "[employer] brand" - use it with her coffee in the am , no diet sodas, no chewing gum , occasional sweetneed teas, she consumes "peach drinks" 1 drink a day . No new medicaitons. No weight loss.    Past Medical History:  Diagnosis Date  . Arthritis   . Breast cancer ([place]) 03/06/2014   right breast with mastectomy and chemo  . Cancer [facility])    Reports lumpectomy for a cyst  . CHF (congestive heart failure) ([place])   . Collagen vascular disease ([place])   . Diabetes ([place])   . DVT (deep venous thrombosis) ([place])   . Hemorrhoids   . Hypertension   . Renal disorder   . Renal insufficiency   . Stroke [facility])    August 2014, but has had strokes prior as well    Past Surgical History:  Procedure Laterality Date  . ABDOMINAL  HYSTERECTOMY     Patient not clear as to why  . BACK SURGERY    . BREAST BIOPSY Right February 15 2014   invasive mammary carcinoma/ER/PR negative, HER 2 positive  . BREAST BIOPSY Right 1999   negative biopsy  . BREAST SURGERY Right 03/06/14   mastectomy  . COLONOSCOPY WITH PROPOFOL N/A 04/15/2016   Procedure: COLONOSCOPY WITH PROPOFOL;  Surgeon: Manya Silvas, MD;  [facility] ENDOSCOPY;  Service: Endoscopy;  Laterality: N/A;  . ESOPHAGOGASTRODUODENOSCOPY (EGD) WITH PROPOFOL N/A 04/15/2016   Procedure: ESOPHAGOGASTRODUODENOSCOPY (EGD) WITH PROPOFOL;  Surgeon: Manya Silvas, MD;  Location: [facility] ENDOSCOPY;  Service: Endoscopy;  Laterality: N/A;  . HAND SURGERY Right    Carpel tunnel release in the 1970s  . LUMBAR LAMINECTOMY/DECOMPRESSION MICRODISCECTOMY Bilateral 07/24/2013   Procedure: LUMBAR LAMINECTOMY/DECOMPRESSION MICRODISCECTOMY LUMBAR THREE-FOUR;  Surgeon: Charlie Pitter, MD;  Location: [facility] NEURO ORS;  Service: Neurosurgery;  Laterality: Bilateral;  . MASTECTOMY Right 03/06/2014   right br ca    Prior to Admission medications   Medication Sig Start Date End Date Taking? Authorizing Provider  acetaminophen (TYLENOL) 500 MG tablet Take 500 mg by mouth every 6 (six) hours as needed.   Yes [provider]  benazepril (LOTENSIN) 20 MG tablet Take 20 mg by mouth daily. 10/15/16  Yes  [provider]  dicyclomine (BENTYL) 20 MG tablet Take 1 tablet (20 mg total) by mouth 4 (four) times daily -  before meals and at bedtime. 04/15/16  Yes Wieting, Richard, MD  gabapentin (NEURONTIN) 300 MG capsule Take 600 mg by mouth 3 (three) times daily.  12/04/13  Yes [provider]  gabapentin (NEURONTIN) 600 MG tablet  11/25/16  Yes [provider]  hydrALAZINE (APRESOLINE) 50 MG tablet Take 50 mg by mouth 3 (three) times daily.    Yes [provider]  HYDROcodone-acetaminophen (NORCO/VICODIN) 5-325 MG per tablet Take 1 tablet by mouth every 6 (six) hours as needed.   02/16/14  Yes [provider]  hyoscyamine (LEVSIN SL) 0.125 MG SL tablet Place 1 tablet (0.125 mg total) under the tongue every 6 (six) hours as needed for cramping. 04/15/16  Yes Wieting, Richard, MD  insulin aspart (NOVOLOG) 100 UNIT/ML injection Inject 8 Units into the skin 3 (three) times daily before meals.   Yes [provider]  insulin detemir (LEVEMIR) 100 UNIT/ML injection Inject 18 Units into the skin at bedtime.   Yes [provider]  lisinopril (PRINIVIL,ZESTRIL) 20 MG tablet Take 1 tablet (20 mg total) by mouth daily. 04/15/16  Yes Wieting, Richard, MD  magnesium oxide (MAG-OX) 400 MG tablet Take 400 mg by mouth 2 (two) times daily.   Yes [provider]  Melatonin 5 MG TABS Take 5 mg by mouth at bedtime.   Yes [provider]  metoprolol succinate (TOPROL-XL) 100 MG 24 hr tablet Take 100 mg by mouth daily. Take with or immediately following a meal.   Yes [provider]  pantoprazole (PROTONIX) 40 MG tablet Take 1 tablet (40 mg total) by mouth 2 (two) times daily before a meal. 04/09/16  Yes Sudini, Srikar, MD  polyethylene glycol (MIRALAX / GLYCOLAX) packet Take 17 g by mouth daily. 04/15/16  Yes Wieting, Richard, MD  pravastatin (PRAVACHOL) 80 MG tablet Take 80 mg by mouth every evening.    Yes [provider]  torsemide (DEMADEX) 20 MG tablet Take 20 mg by mouth daily.    Yes [provider]  traMADol (ULTRAM) 50 MG tablet Take 1 tablet (50 mg total) by mouth every 12 (twelve) hours as needed for severe pain. 04/09/16  Yes Sudini, Alveta Heimlich, MD  clopidogrel (PLAVIX) 75 MG tablet Take 75 mg by mouth daily with breakfast.    [provider]  Glucosamine-Chondroit-Vit C-Mn (GLUCOSAMINE 1500 COMPLEX PO) Take by mouth 3 (three) times daily.    [provider]  Omega 3 1200 MG CAPS Take by mouth 2 (two) times daily.    [provider]  ondansetron (ZOFRAN) 4 MG tablet Take 4 mg by mouth every 8 (eight)  hours as needed for nausea or vomiting.    [provider]  ondansetron (ZOFRAN-ODT) 4 MG disintegrating tablet  12/03/16   [provider]    Family History  Problem Relation Age of Onset  . Cancer Sister 52       breast  . Breast cancer Sister 83  . Cancer Brother        kidney  . Cancer Maternal Aunt        breast  . Breast cancer Maternal Aunt 70  . Cancer Other        maternal niece with breast cancer  . Breast cancer Other   . Cancer Sister        pancreatic     Social History  Substance Use Topics  . Smoking status: Never Smoker  . Smokeless tobacco: Never Used  . Alcohol use No    Allergies as of 12/29/2016  . (No Known Allergies)    Review of Systems:    All systems reviewed and negative except where noted in HPI.   Physical Exam:  BP (!) 151/62   Pulse 94   Temp 98.3 F (36.8 C) (Oral)   Ht 5\' 1"  (1.549 m)   Wt 191 lb 12.8 oz (87 kg)   BMI 36.24 kg/m  No LMP recorded. Patient has had a hysterectomy. Psych:  Alert and cooperative. Normal mood and affect. General:   Alert,  Well-developed, well-nourished, pleasant and cooperative in NAD Head:  Normocephalic and atraumatic. Eyes:  Sclera clear, no icterus.   Conjunctiva pink. Ears:  Normal auditory acuity. Nose:  No deformity, discharge, or lesions. Mouth:  No deformity or lesions,oropharynx pink & moist. Neck:  Supple; no masses or thyromegaly. Lungs:  Respirations even and unlabored.  Clear throughout to auscultation.   No wheezes, crackles, or rhonchi. No acute distress. Heart:  Regular rate and rhythm; no murmurs, clicks, rubs, or gallops. Abdomen:  Normal bowel sounds.  No bruits.  Soft, non-tender and non-distended without masses, hepatosplenomegaly or hernias noted.  No guarding or rebound tenderness.    Msk:  Symmetrical without gross deformities. Good, equal movement & strength bilaterally. Extremities:  No clubbing or edema.  No cyanosis. Neurologic:  Alert and oriented x3;   grossly normal neurologically. Psych:  Alert and cooperative. Normal mood and affect.  Imaging Studies: No results found.  Assessment and Plan:   Kristen Barron is a 83 y.o. y/o female has been referred for on and off diarrhea since sept 2017 . Based on her history of ocasional diarrhea may be secondary to consuming artificial sugars in her diet .   Plan  1. Stop all artificial sugars for 7-10 days- if has no diarrhea then we have a cause for the same .  2. If no better she will call me back and then will order stool tests. She has had a normal colonoscopy last year and no need to repeat one at this time.  3. Fiber pills-samples provided   Follow up as needed   Dr Jonathon Bellows MD

## 2017-01-13 IMAGING — US US ABDOMEN LIMITED
1 series · 14 of 25 positions shown · non-contrast
Comparison: CT dated 04/09/2016

CLINICAL DATA: 86-year-old female with abdominal pain.

EXAM:
US ABDOMEN LIMITED - RIGHT UPPER QUADRANT

[Series 1: us abdomen limited · 0.27mm/px · 14 of 45 slices shown]
[im 1/45]
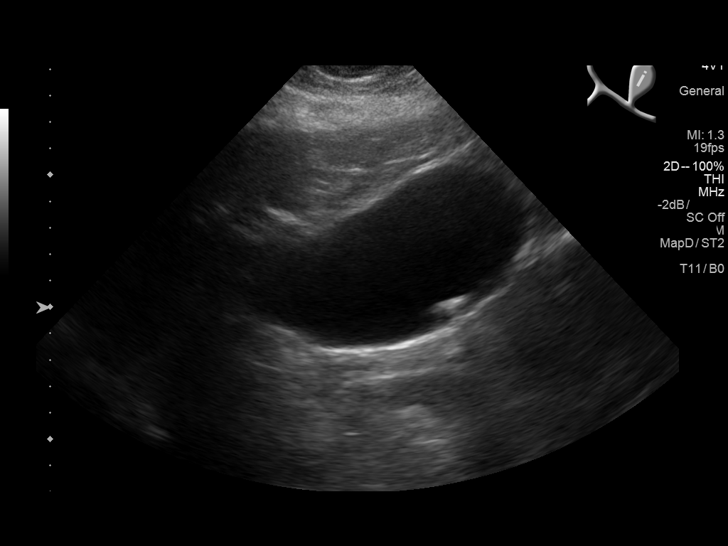
[im 4/45]
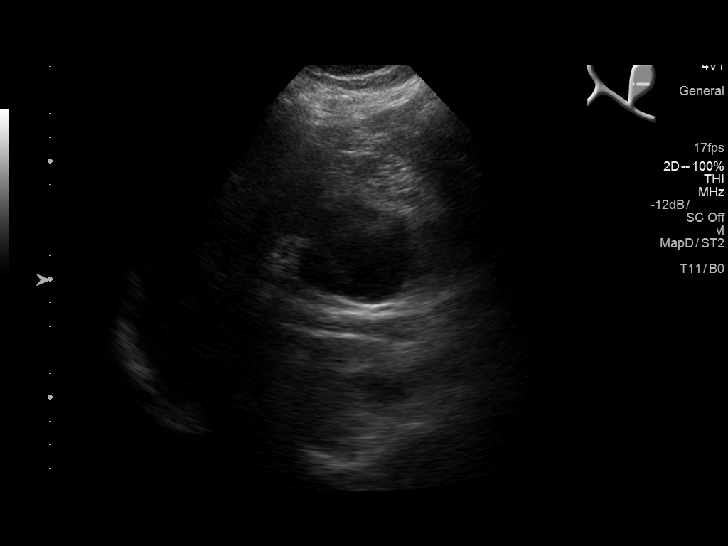
[im 8/45]
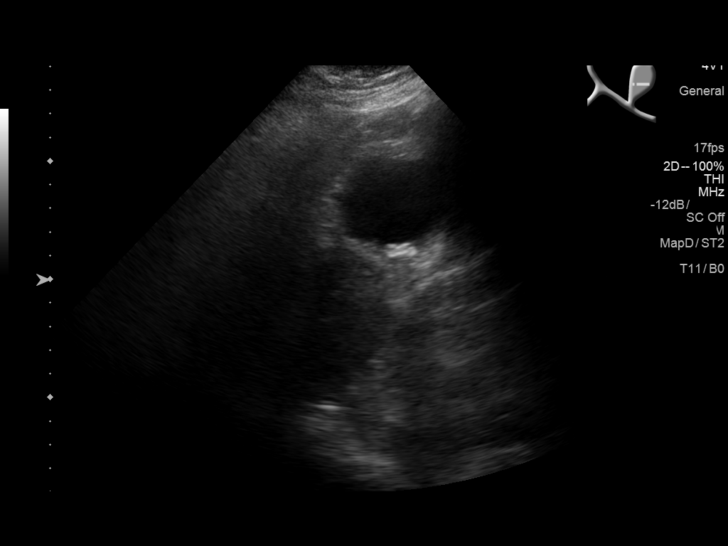
[im 12/45]
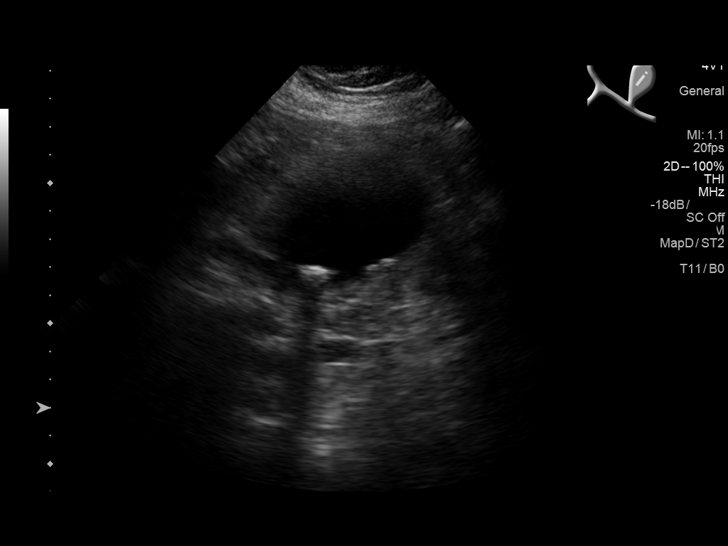
[im 15/45]
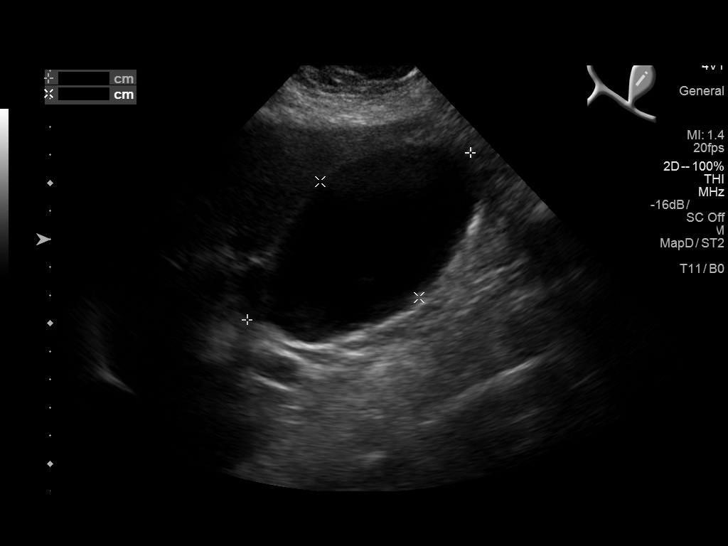
[im 17/45]
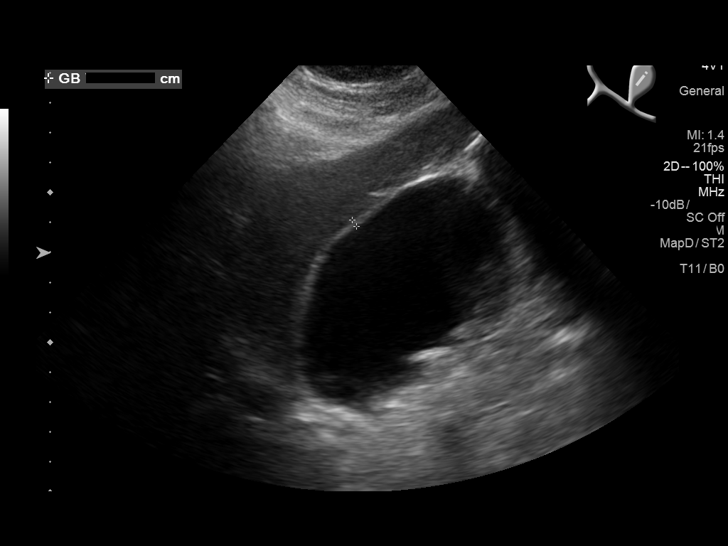
[im 21/45]
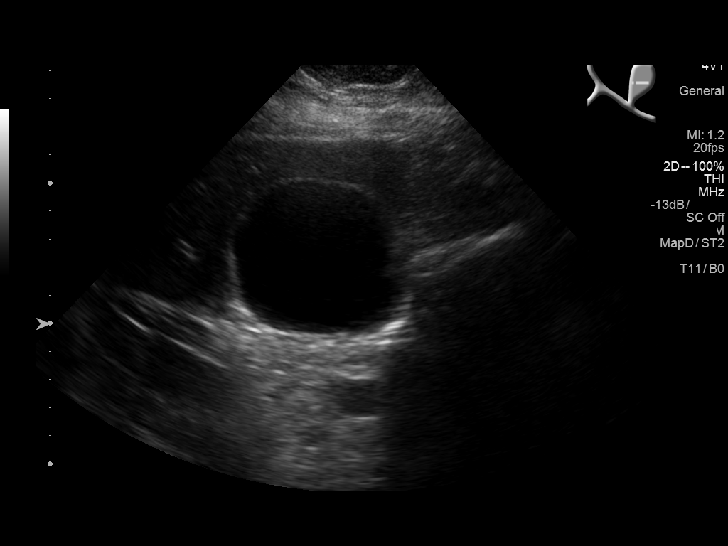
[im 24/45]
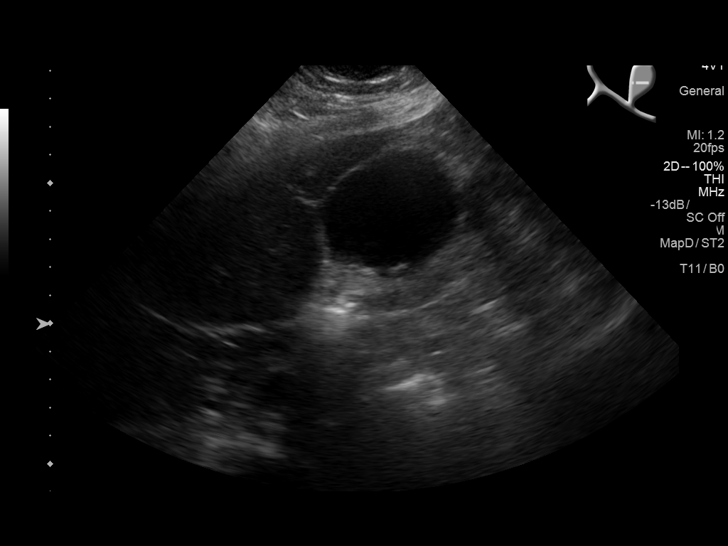
[im 28/45]
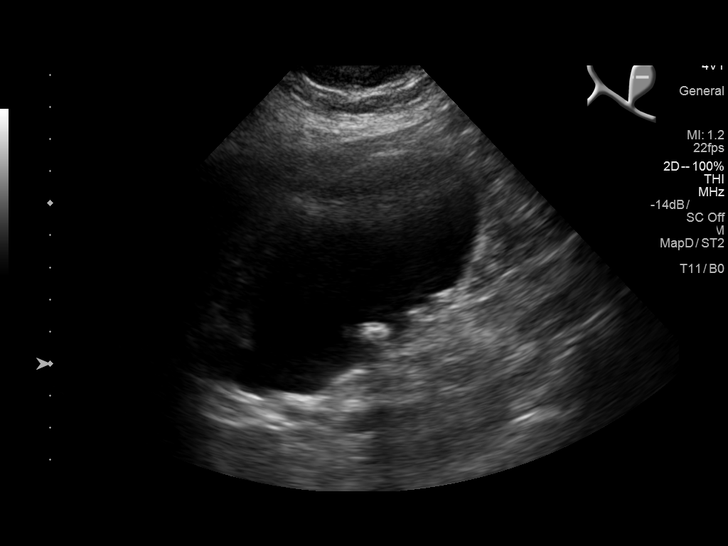
[im 30/45]
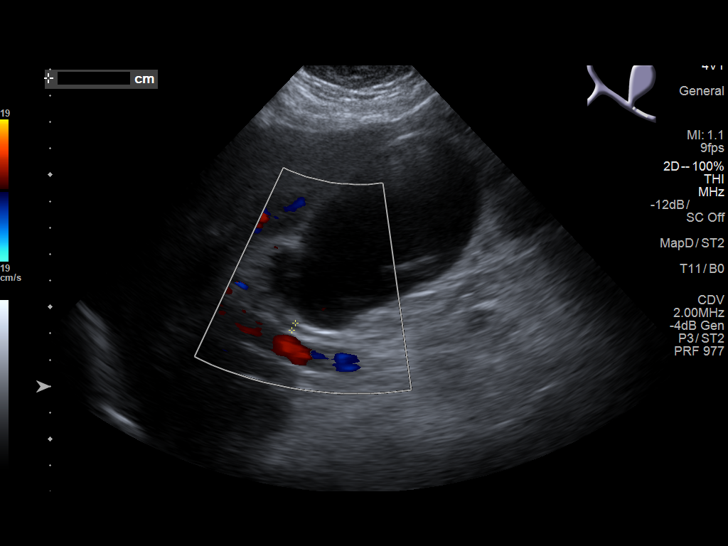
[im 34/45]
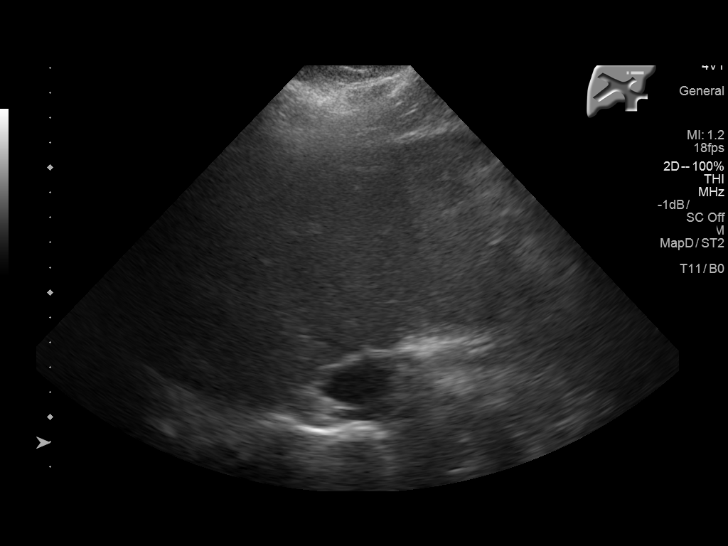
[im 37/45]
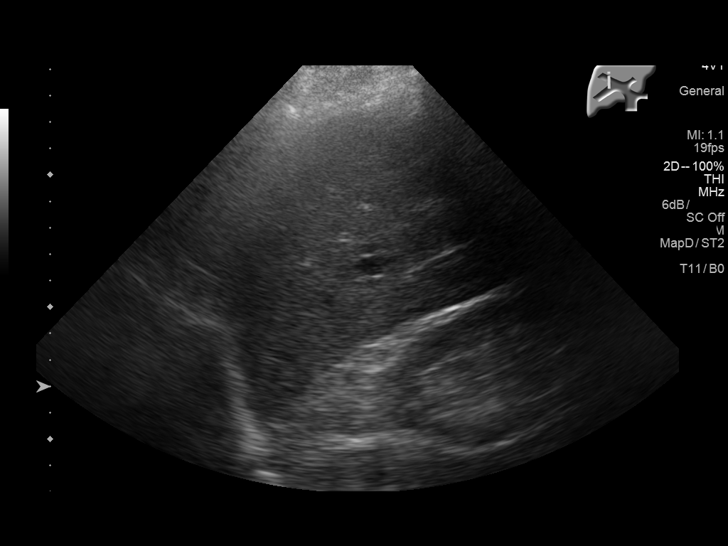
[im 41/45]
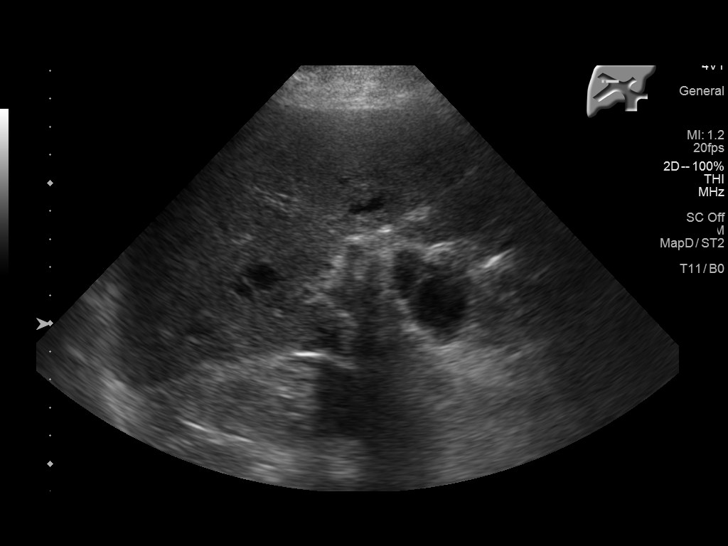
[im 45/45]
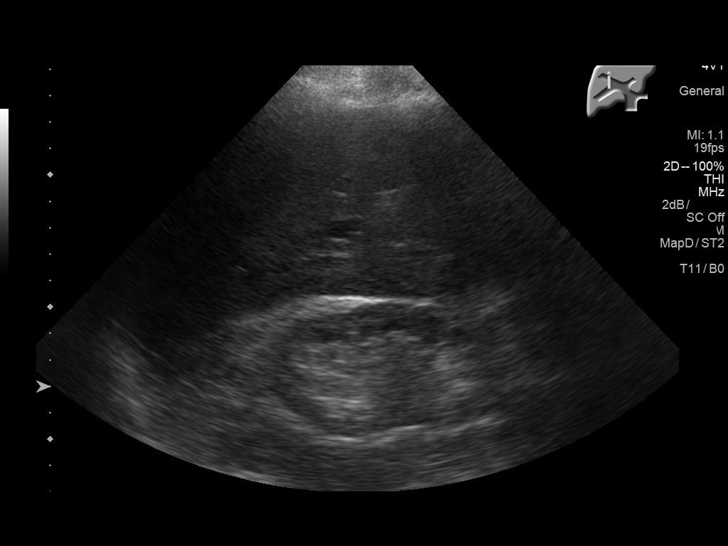

[14 of 25 positions shown; findings below may reference images not displayed]

FINDINGS: Gallbladder:

There multiple small stones within the gallbladder. The gallbladder
is mildly distended. There is no gallbladder wall thickening or
pericholecystic fluid. Positive sonographic Murphy's sign.

Common bile duct:

Diameter: 3 mm

Liver:

No focal lesion identified. Within normal limits in parenchymal
echogenicity.
IMPRESSION: Cholelithiasis without definite sonographic evidence of acute
cholecystitis. A hepatobiliary scintigraphy may provide better
evaluation of the gallbladder if an acute cholecystitis is
clinically suspected.

## 2017-02-19 ENCOUNTER — Other Ambulatory Visit: Payer: Self-pay | Admitting: Oncology

## 2017-02-19 ENCOUNTER — Ambulatory Visit
Admission: RE | Admit: 2017-02-19 | Discharge: 2017-02-19 | Disposition: A | Discharge status: Home / Self Care | Payer: Medicare HMO | Source: Ambulatory Visit | Attending: Oncology | Admitting: Oncology

## 2017-02-19 DIAGNOSIS — C50511 Malignant neoplasm of lower-outer quadrant of right female breast: Secondary | ICD-10-CM

## 2017-02-19 DIAGNOSIS — Z1231 Encounter for screening mammogram for malignant neoplasm of breast: Secondary | ICD-10-CM | POA: Insufficient documentation

## 2017-02-19 DIAGNOSIS — Z171 Estrogen receptor negative status [ER-]: Secondary | ICD-10-CM

## 2017-03-22 NOTE — Progress Notes (Deleted)
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  INTERVAL HISTORY:   Patient returns to clinic today for routine 6 month evaluation. She currently feels well and is asymptomatic. She has chronic knee pain that is unchanged. She has no neurologic complaints. She denies any other pain. She has a good appetite and denies weight loss. She has no chest pain or shortness of breath. She denies any nausea, vomiting, constipation, or diarrhea. She has no urinary complaints. Patient offers no further specific complaints today.   REVIEW OF SYSTEMS:   Review of Systems  Constitutional: Negative.  Negative for fever, malaise/fatigue and weight loss.  Respiratory: Negative.  Negative for cough and shortness of breath.   Cardiovascular: Negative.  Negative for chest pain.  Gastrointestinal: Negative.  Negative for abdominal pain.  Genitourinary: Negative.   Musculoskeletal: Positive for back pain and joint pain.  Neurological: Negative.  Negative for weakness.  Psychiatric/Behavioral: Negative.     As per HPI. Otherwise, a complete review of systems is negative.  ONCOLOGY HISTORY: Oncology History   1. Carcinoma of the right breast T1c N0 M0 tumor based on ultrasound and the breast mammogram. ER negative, PR negative, HER-2/neu positive (diagnosis in July of 2015). 2. Iron deficiency anemia with chronic renal disease. 3. Status post mastectomy (right) in August of 2015. Patient has stage IC, ER negative, PR negative, HER-2 receptor positive.  4. Patient started on chemotherapy with Taxol and Herceptin on April 09, 2014. 5. Because of progressive side effect Taxol was discontinued (August 28, 2014) Herceptin would be continued to July of 2016 6.  Because of swelling of lower extremity and shortness of breath Herceptin was discontinued in March of 2016.     Breast cancer of lower-outer quadrant of right female breast ([place])   02/28/2014 Initial Diagnosis    Breast cancer, right, invasive ductal       PAST MEDICAL HISTORY: Past Medical History:  Diagnosis Date  . Arthritis   . Breast cancer ([place]) 03/06/2014   right breast with mastectomy and chemo  . Cancer [facility])    Reports lumpectomy for a cyst  . CHF (congestive heart failure) ([place])   . Collagen vascular disease ([place])   . Diabetes ([place])   . DVT (deep venous thrombosis) ([place])   . Hemorrhoids   . Hypertension   . Renal disorder   . Renal insufficiency   . Stroke [facility])    August 2014, but has had strokes prior as well    PAST SURGICAL HISTORY: Past Surgical History:  Procedure Laterality Date  . ABDOMINAL HYSTERECTOMY     Patient not clear as to why  . BACK SURGERY    . BREAST BIOPSY Right February 15 2014   invasive mammary carcinoma/ER/PR negative, HER 2 positive  . BREAST BIOPSY Right 1999   negative biopsy  . BREAST SURGERY Right 03/06/14   mastectomy  . COLONOSCOPY WITH PROPOFOL N/A 04/15/2016   Procedure: COLONOSCOPY WITH PROPOFOL;  Surgeon: Manya Silvas, MD;  Location: [facility] ENDOSCOPY;  Service: Endoscopy;  Laterality: N/A;  . ESOPHAGOGASTRODUODENOSCOPY (EGD) WITH PROPOFOL N/A 04/15/2016   Procedure: ESOPHAGOGASTRODUODENOSCOPY (EGD) WITH PROPOFOL;  Surgeon: Manya Silvas, MD;  Location: [facility] ENDOSCOPY;  Service: Endoscopy;  Laterality: N/A;  . HAND SURGERY Right    Carpel tunnel release in the 1970s  . LUMBAR LAMINECTOMY/DECOMPRESSION MICRODISCECTOMY Bilateral 07/24/2013   Procedure: LUMBAR LAMINECTOMY/DECOMPRESSION MICRODISCECTOMY LUMBAR THREE-FOUR;  Surgeon: Charlie Pitter, MD;  Location: [place] NEURO ORS;  Service: Neurosurgery;   Laterality: Bilateral;  . MASTECTOMY Right 03/06/2014   right br ca    FAMILY HISTORY Family History  Problem Relation Age of Onset  . Cancer Sister 91       breast  . Breast cancer Sister 61  . Cancer Brother        kidney  . Cancer Maternal Aunt        breast  . Breast cancer Maternal Aunt 70  . Cancer Other        maternal niece with breast cancer  . Breast cancer Other   . Cancer Sister        pancreatic    GYNECOLOGIC HISTORY:  No LMP recorded. Patient has had a hysterectomy.     ADVANCED DIRECTIVES:    HEALTH MAINTENANCE: Social History  Substance Use Topics  . Smoking status: Never Smoker  . Smokeless tobacco: Never Used  . Alcohol use No    No Known Allergies  Current Outpatient Prescriptions  Medication Sig Dispense Refill  . acetaminophen (TYLENOL) 500 MG tablet Take 500 mg by mouth every 6 (six) hours as needed.    . benazepril (LOTENSIN) 20 MG tablet Take 20 mg by mouth daily.    . clopidogrel (PLAVIX) 75 MG tablet Take 75 mg by mouth daily with breakfast.    . dicyclomine (BENTYL) 20 MG tablet Take 1 tablet (20 mg total) by mouth 4 (four) times daily -  before meals and at bedtime. 120 tablet 0  . gabapentin (NEURONTIN) 300 MG capsule Take 600 mg by mouth 3 (three) times daily.     Marland Kitchen gabapentin (NEURONTIN) 600 MG tablet     . Glucosamine-Chondroit-Vit C-Mn (GLUCOSAMINE 1500 COMPLEX PO) Take by mouth 3 (three) times daily.    . hydrALAZINE (APRESOLINE) 50 MG tablet Take 50 mg by mouth 3 (three) times daily.     Marland Kitchen HYDROcodone-acetaminophen (NORCO/VICODIN) 5-325 MG per tablet Take 1 tablet by mouth every 6 (six) hours as needed.     . hyoscyamine (LEVSIN SL) 0.125 MG SL tablet Place 1 tablet (0.125 mg total) under the tongue every 6 (six) hours as needed for cramping. 30 tablet 0  . insulin aspart (NOVOLOG) 100 UNIT/ML injection Inject 8 Units into the skin 3 (three) times daily before meals.    . insulin detemir (LEVEMIR) 100 UNIT/ML injection Inject 18  Units into the skin at bedtime.    Marland Kitchen lisinopril (PRINIVIL,ZESTRIL) 20 MG tablet Take 1 tablet (20 mg total) by mouth daily. 30 tablet 0  . magnesium oxide (MAG-OX) 400 MG tablet Take 400 mg by mouth 2 (two) times daily.    . Melatonin 5 MG TABS Take 5 mg by mouth at bedtime.    . metoprolol succinate (TOPROL-XL) 100 MG 24 hr tablet Take 100 mg by mouth daily. Take with or immediately following a meal.    . Omega 3 1200 MG CAPS Take by mouth 2 (two) times daily.    . ondansetron (ZOFRAN) 4 MG tablet Take 4 mg by mouth every 8 (eight) hours as needed for nausea or vomiting.    . ondansetron (ZOFRAN-ODT) 4 MG disintegrating tablet     .  pantoprazole (PROTONIX) 40 MG tablet Take 1 tablet (40 mg total) by mouth 2 (two) times daily before a meal. 60 tablet 0  . polyethylene glycol (MIRALAX / GLYCOLAX) packet Take 17 g by mouth daily. 30 each 0  . pravastatin (PRAVACHOL) 80 MG tablet Take 80 mg by mouth every evening.     . torsemide (DEMADEX) 20 MG tablet Take 20 mg by mouth daily.     . traMADol (ULTRAM) 50 MG tablet Take 1 tablet (50 mg total) by mouth every 12 (twelve) hours as needed for severe pain. 15 tablet 0   No current facility-administered medications for this visit.    Facility-Administered Medications Ordered in Other Visits  Medication Dose Route Frequency Provider Last Rate Last Dose  . sodium chloride flush (NS) 0.9 % injection 10 mL  10 mL Intravenous PRN Lloyd Huger, MD   10 mL at 09/23/16 1103    OBJECTIVE: There were no vitals taken for this visit.   There is no height or weight on file to calculate BMI.    ECOG FS:2 - Symptomatic, <50% confined to bed  General: Well-developed, well-nourished, no acute distress. Using walker for ambulation aid. Eyes: Pink conjunctiva, anicteric sclera. Lungs: Clear to auscultation bilaterally. Heart: Regular rate and rhythm. 3/6 systolic murmur present. Abdomen: Soft, nontender, nondistended. No organomegaly noted, normoactive bowel  sounds. Breast: Right chest wall and axilla without lumps or masses.  Musculoskeletal: 2+ lower extremity bilateral edema, no cyanosis or clubbing. Neuro: Alert, answering all questions appropriately. Cranial nerves grossly intact. Skin: No rashes or petechiae noted. Psych: Normal affect.   LAB RESULTS:  No visits with results within 3 Day(s) from this visit.  Latest known visit with results is:  Admission on 11/23/2016, Discharged on 11/25/2016  Component Date Value Ref Range Status  . WBC 11/23/2016 5.5  3.6 - 11.0 K/uL Final  . RBC 11/23/2016 3.87  3.80 - 5.20 MIL/uL Final  . Hemoglobin 11/23/2016 11.3* 12.0 - 16.0 g/dL Final  . HCT 11/23/2016 33.6* 35.0 - 47.0 % Final  . MCV 11/23/2016 86.7  80.0 - 100.0 fL Final  . MCH 11/23/2016 29.1  26.0 - 34.0 pg Final  . MCHC 11/23/2016 33.5  32.0 - 36.0 g/dL Final  . RDW 11/23/2016 14.7* 11.5 - 14.5 % Final  . Platelets 11/23/2016 318  150 - 440 K/uL Final  . Sodium 11/23/2016 130* 135 - 145 mmol/L Final  . Potassium 11/23/2016 4.0  3.5 - 5.1 mmol/L Final  . Chloride 11/23/2016 96* 101 - 111 mmol/L Final  . CO2 11/23/2016 23  22 - 32 mmol/L Final  . Glucose, Bld 11/23/2016 199* 65 - 99 mg/dL Final  . BUN 11/23/2016 13  6 - 20 mg/dL Final  . Creatinine, Ser 11/23/2016 1.27* 0.44 - 1.00 mg/dL Final  . Calcium 11/23/2016 9.2  8.9 - 10.3 mg/dL Final  . Total Protein 11/23/2016 7.5  6.5 - 8.1 g/dL Final  . Albumin 11/23/2016 3.8  3.5 - 5.0 g/dL Final  . AST 11/23/2016 25  15 - 41 U/L Final  . ALT 11/23/2016 11* 14 - 54 U/L Final  . Alkaline Phosphatase 11/23/2016 66  38 - 126 U/L Final  . Total Bilirubin 11/23/2016 0.8  0.3 - 1.2 mg/dL Final  . GFR calc non Af Amer 11/23/2016 37* >60 mL/min Final  . GFR calc Af Amer 11/23/2016 43* >60 mL/min Final   Comment: (NOTE) The eGFR has been calculated using the CKD EPI equation. This calculation has not  been validated in all clinical situations. eGFR's persistently <60 mL/min signify possible  Chronic Kidney Disease.   . Anion gap 11/23/2016 11  5 - 15 Final  . Lipase 11/23/2016 15  11 - 51 U/L Final  . Troponin I 11/23/2016 <0.03  <0.03 ng/mL Final  . Sodium 11/24/2016 134* 135 - 145 mmol/L Final  . Potassium 11/24/2016 3.7  3.5 - 5.1 mmol/L Final  . Chloride 11/24/2016 102  101 - 111 mmol/L Final  . CO2 11/24/2016 26  22 - 32 mmol/L Final  . Glucose, Bld 11/24/2016 129* 65 - 99 mg/dL Final  . BUN 11/24/2016 10  6 - 20 mg/dL Final  . Creatinine, Ser 11/24/2016 1.06* 0.44 - 1.00 mg/dL Final  . Calcium 11/24/2016 8.5* 8.9 - 10.3 mg/dL Final  . GFR calc non Af Amer 11/24/2016 47* >60 mL/min Final  . GFR calc Af Amer 11/24/2016 54* >60 mL/min Final   Comment: (NOTE) The eGFR has been calculated using the CKD EPI equation. This calculation has not been validated in all clinical situations. eGFR's persistently <60 mL/min signify possible Chronic Kidney Disease.   . Anion gap 11/24/2016 6  5 - 15 Final  . WBC 11/24/2016 6.3  3.6 - 11.0 K/uL Final  . RBC 11/24/2016 3.29* 3.80 - 5.20 MIL/uL Final  . Hemoglobin 11/24/2016 9.7* 12.0 - 16.0 g/dL Final  . HCT 11/24/2016 28.9* 35.0 - 47.0 % Final  . MCV 11/24/2016 87.7  80.0 - 100.0 fL Final  . MCH 11/24/2016 29.5  26.0 - 34.0 pg Final  . MCHC 11/24/2016 33.7  32.0 - 36.0 g/dL Final  . RDW 11/24/2016 14.7* 11.5 - 14.5 % Final  . Platelets 11/24/2016 268  150 - 440 K/uL Final  . Glucose-Capillary 11/23/2016 134* 65 - 99 mg/dL Final  . Glucose-Capillary 11/23/2016 182* 65 - 99 mg/dL Final  . Comment 1 11/23/2016 Notify RN   Final  . Glucose-Capillary 11/24/2016 112* 65 - 99 mg/dL Final  . Glucose-Capillary 11/24/2016 141* 65 - 99 mg/dL Final  . Glucose-Capillary 11/24/2016 182* 65 - 99 mg/dL Final  . Glucose-Capillary 11/24/2016 214* 65 - 99 mg/dL Final  . Comment 1 11/24/2016 Notify RN   Final  . Glucose-Capillary 11/25/2016 122* 65 - 99 mg/dL Final  . Comment 1 11/25/2016 Notify RN   Final  . Comment 2 11/25/2016 Document in  Chart   Final  . Glucose-Capillary 11/25/2016 177* 65 - 99 mg/dL Final  . Comment 1 11/25/2016 Notify RN   Final  . Comment 2 11/25/2016 Document in Chart   Final    STUDIES: No results found.  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  PLAN:    1. Stage Ia ER/PR negative, HER-2 positive adenoarcinoma of the lower outer quadrant of the right breast:  No evidence of recurrent disease. Patient is status post right mastectomy on March 06, 2014.  Patient also received Taxol and Herceptin between August 2015 and to January 2016. Taxol was discontinued secondary to side effects. Herceptin was also subsequently discontinued in March 2016. Patient's most recent mammogram on February 19, 2016 was reported as BI-RADS 1, repeat in July 2018. She does not require aromatase inhibitor given the ER/PR status of her tumor.  Return to clinic in 6 months with repeat laboratory work and further evaluation. 2. Joint pain: Chronic and unchanged. Continue treatment per primary care. 3. Anemia: Mild, monitor.  Patient expressed understanding and was  in agreement with this plan. She also understands that She can call clinic at any time with any questions, concerns, or complaints.    Breast cancer, right, invasive ductal   Staging form: Breast, AJCC 7th Edition     Clinical: Stage IA (T1c, N0, M0) - Unsigned   Lloyd Huger, MD   03/22/2017 11:08 PM

## 2017-03-23 ENCOUNTER — Inpatient Hospital Stay: Payer: Medicare HMO | Admitting: Oncology

## 2017-04-04 NOTE — Progress Notes (Signed)
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  INTERVAL HISTORY:   Patient returns to clinic today for routine 6 month evaluation. She currently feels well and is asymptomatic. She has chronic knee pain that is unchanged. She has no neurologic complaints. She denies any recent fevers or illnesses. She denies any other pain. She has a good appetite and denies weight loss. She has no chest pain or shortness of breath. She denies any nausea, vomiting, constipation, or diarrhea. She has no urinary complaints. Patient offers no further specific complaints today.   REVIEW OF SYSTEMS:   Review of Systems  Constitutional: Negative.  Negative for fever, malaise/fatigue and weight loss.  Respiratory: Negative.  Negative for cough and shortness of breath.   Cardiovascular: Negative.  Negative for chest pain and leg swelling.  Gastrointestinal: Negative.  Negative for abdominal pain.  Genitourinary: Negative.   Musculoskeletal: Positive for back pain and joint pain.  Skin: Negative.  Negative for rash.  Neurological: Negative.  Negative for weakness.  Psychiatric/Behavioral: Negative.     As per HPI. Otherwise, a complete review of systems is negative.  ONCOLOGY HISTORY: Oncology History   1. Carcinoma of the right breast T1c N0 M0 tumor based on ultrasound and the breast mammogram. ER negative, PR negative, HER-2/neu positive (diagnosis in July of 2015). 2. Iron deficiency anemia with chronic renal disease. 3. Status post mastectomy (right) in August of 2015. Patient has stage IC, ER negative, PR negative,  HER-2 receptor positive.  4. Patient started on chemotherapy with Taxol and Herceptin on April 09, 2014. 5. Because of progressive side effect Taxol was discontinued (August 28, 2014) Herceptin would be continued to July of 2016 6.  Because of swelling of lower extremity and shortness of breath Herceptin was discontinued in March of 2016.     Breast cancer of lower-outer quadrant of right female breast ([place])   02/28/2014 Initial Diagnosis    Breast cancer, right, invasive ductal       PAST MEDICAL HISTORY: Past Medical History:  Diagnosis Date  . Arthritis   . Breast cancer ([place]) 03/06/2014   right breast with mastectomy and chemo  . Cancer [facility])    Reports lumpectomy for a cyst  . CHF (congestive heart failure) ([place])   . Collagen vascular disease ([place])   . Diabetes ([place])   . DVT (deep venous thrombosis) ([place])   . Hemorrhoids   . Hypertension   . Renal disorder   . Renal insufficiency   . Stroke [facility])    August 2014, but has had strokes prior as well    PAST SURGICAL HISTORY: Past Surgical History:  Procedure Laterality Date  . ABDOMINAL HYSTERECTOMY     Patient not clear as to why  . BACK SURGERY    . BREAST BIOPSY Right February 15 2014   invasive mammary carcinoma/ER/PR negative, HER 2 positive  . BREAST BIOPSY Right 1999   negative biopsy  . BREAST SURGERY Right 03/06/14   mastectomy  . COLONOSCOPY WITH PROPOFOL N/A 04/15/2016   Procedure: COLONOSCOPY WITH PROPOFOL;  Surgeon: Manya Silvas, MD;  Location: [facility] ENDOSCOPY;  Service: Endoscopy;  Laterality:  N/A;  . ESOPHAGOGASTRODUODENOSCOPY (EGD) WITH PROPOFOL N/A 04/15/2016   Procedure: ESOPHAGOGASTRODUODENOSCOPY (EGD) WITH PROPOFOL;  Surgeon: Manya Silvas, MD;  Location: [facility] ENDOSCOPY;  Service: Endoscopy;  Laterality: N/A;  . HAND SURGERY Right    Carpel tunnel release in the 1970s  . LUMBAR LAMINECTOMY/DECOMPRESSION MICRODISCECTOMY Bilateral 07/24/2013   Procedure: LUMBAR LAMINECTOMY/DECOMPRESSION  MICRODISCECTOMY LUMBAR THREE-FOUR;  Surgeon: Charlie Pitter, MD;  Location: [place] NEURO ORS;  Service: Neurosurgery;  Laterality: Bilateral;  . MASTECTOMY Right 03/06/2014   right br ca    FAMILY HISTORY Family History  Problem Relation Age of Onset  . Cancer Sister 5       breast  . Breast cancer Sister 47  . Cancer Brother        kidney  . Cancer Maternal Aunt        breast  . Breast cancer Maternal Aunt 70  . Cancer Other        maternal niece with breast cancer  . Breast cancer Other   . Cancer Sister        pancreatic    GYNECOLOGIC HISTORY:  No LMP recorded. Patient has had a hysterectomy.     ADVANCED DIRECTIVES:    HEALTH MAINTENANCE: Social History  Substance Use Topics  . Smoking status: Never Smoker  . Smokeless tobacco: Never Used  . Alcohol use No    No Known Allergies  Current Outpatient Prescriptions  Medication Sig Dispense Refill  . acetaminophen (TYLENOL) 500 MG tablet Take 500 mg by mouth every 6 (six) hours as needed.    . benazepril (LOTENSIN) 20 MG tablet Take 20 mg by mouth daily.    . clopidogrel (PLAVIX) 75 MG tablet Take 75 mg by mouth daily with breakfast.    . dicyclomine (BENTYL) 20 MG tablet Take 1 tablet (20 mg total) by mouth 4 (four) times daily -  before meals and at bedtime. 120 tablet 0  . gabapentin (NEURONTIN) 300 MG capsule Take 600 mg by mouth 3 (three) times daily.     Marland Kitchen gabapentin (NEURONTIN) 600 MG tablet     . Glucosamine-Chondroit-Vit C-Mn (GLUCOSAMINE 1500 COMPLEX PO) Take by mouth 3 (three) times daily.    . hydrALAZINE (APRESOLINE) 50 MG tablet Take 50 mg by mouth 3 (three) times daily.     Marland Kitchen HYDROcodone-acetaminophen (NORCO/VICODIN) 5-325 MG per tablet Take 1 tablet by mouth every 6 (six) hours as needed.     . hyoscyamine (LEVSIN SL) 0.125 MG SL tablet Place 1 tablet (0.125 mg total) under the tongue every 6 (six) hours as needed for cramping. 30 tablet 0  . insulin aspart (NOVOLOG) 100 UNIT/ML injection Inject 8 Units  into the skin 3 (three) times daily before meals.    . insulin detemir (LEVEMIR) 100 UNIT/ML injection Inject 18 Units into the skin at bedtime.    Marland Kitchen lisinopril (PRINIVIL,ZESTRIL) 20 MG tablet Take 1 tablet (20 mg total) by mouth daily. 30 tablet 0  . magnesium oxide (MAG-OX) 400 MG tablet Take 400 mg by mouth 2 (two) times daily.    . Melatonin 5 MG TABS Take 5 mg by mouth at bedtime.    . metoprolol succinate (TOPROL-XL) 100 MG 24 hr tablet Take 100 mg by mouth daily. Take with or immediately following a meal.    . Omega 3 1200 MG CAPS Take by mouth 2 (two) times daily.    . ondansetron (ZOFRAN) 4 MG tablet Take 4 mg by mouth every 8 (eight) hours as needed for  nausea or vomiting.    . ondansetron (ZOFRAN-ODT) 4 MG disintegrating tablet     . pantoprazole (PROTONIX) 40 MG tablet Take 1 tablet (40 mg total) by mouth 2 (two) times daily before a meal. 60 tablet 0  . polyethylene glycol (MIRALAX / GLYCOLAX) packet Take 17 g by mouth daily. 30 each 0  . pravastatin (PRAVACHOL) 80 MG tablet Take 80 mg by mouth every evening.     . torsemide (DEMADEX) 20 MG tablet Take 20 mg by mouth daily.     . traMADol (ULTRAM) 50 MG tablet Take 1 tablet (50 mg total) by mouth every 12 (twelve) hours as needed for severe pain. 15 tablet 0   No current facility-administered medications for this visit.    Facility-Administered Medications Ordered in Other Visits  Medication Dose Route Frequency Provider Last Rate Last Dose  . sodium chloride flush (NS) 0.9 % injection 10 mL  10 mL Intravenous PRN Lloyd Huger, MD   10 mL at 09/23/16 1103    OBJECTIVE: BP (!) 201/68 (BP Location: Left Arm, Patient Position: Sitting)   Pulse 78   Temp (!) 97.3 F (36.3 C) (Tympanic)   Resp 18   Wt 195 lb 4.8 oz (88.6 kg)   BMI 36.90 kg/m    Body mass index is 36.9 kg/m.    ECOG FS:2 - Symptomatic, <50% confined to bed  General: Well-developed, well-nourished, no acute distress. Using walker for ambulation  aid. Eyes: Pink conjunctiva, anicteric sclera. Lungs: Clear to auscultation bilaterally. Heart: Regular rate and rhythm. 3/6 systolic murmur present. Abdomen: Soft, nontender, nondistended. No organomegaly noted, normoactive bowel sounds. Breast: Right chest wall and axilla without lumps or masses.  Musculoskeletal: 2+ lower extremity bilateral edema, no cyanosis or clubbing. Neuro: Alert, answering all questions appropriately. Cranial nerves grossly intact. Skin: No rashes or petechiae noted. Psych: Normal affect.   LAB RESULTS:  No visits with results within 3 Day(s) from this visit.  Latest known visit with results is:  Admission on 11/23/2016, Discharged on 11/25/2016  Component Date Value Ref Range Status  . WBC 11/23/2016 5.5  3.6 - 11.0 K/uL Final  . RBC 11/23/2016 3.87  3.80 - 5.20 MIL/uL Final  . Hemoglobin 11/23/2016 11.3* 12.0 - 16.0 g/dL Final  . HCT 11/23/2016 33.6* 35.0 - 47.0 % Final  . MCV 11/23/2016 86.7  80.0 - 100.0 fL Final  . MCH 11/23/2016 29.1  26.0 - 34.0 pg Final  . MCHC 11/23/2016 33.5  32.0 - 36.0 g/dL Final  . RDW 11/23/2016 14.7* 11.5 - 14.5 % Final  . Platelets 11/23/2016 318  150 - 440 K/uL Final  . Sodium 11/23/2016 130* 135 - 145 mmol/L Final  . Potassium 11/23/2016 4.0  3.5 - 5.1 mmol/L Final  . Chloride 11/23/2016 96* 101 - 111 mmol/L Final  . CO2 11/23/2016 23  22 - 32 mmol/L Final  . Glucose, Bld 11/23/2016 199* 65 - 99 mg/dL Final  . BUN 11/23/2016 13  6 - 20 mg/dL Final  . Creatinine, Ser 11/23/2016 1.27* 0.44 - 1.00 mg/dL Final  . Calcium 11/23/2016 9.2  8.9 - 10.3 mg/dL Final  . Total Protein 11/23/2016 7.5  6.5 - 8.1 g/dL Final  . Albumin 11/23/2016 3.8  3.5 - 5.0 g/dL Final  . AST 11/23/2016 25  15 - 41 U/L Final  . ALT 11/23/2016 11* 14 - 54 U/L Final  . Alkaline Phosphatase 11/23/2016 66  38 - 126 U/L Final  . Total Bilirubin 11/23/2016 0.8  0.3 - 1.2 mg/dL Final  . GFR calc non Af Amer 11/23/2016 37* >60 mL/min Final  . GFR calc  Af Amer 11/23/2016 43* >60 mL/min Final   Comment: (NOTE) The eGFR has been calculated using the CKD EPI equation. This calculation has not been validated in all clinical situations. eGFR's persistently <60 mL/min signify possible Chronic Kidney Disease.   . Anion gap 11/23/2016 11  5 - 15 Final  . Lipase 11/23/2016 15  11 - 51 U/L Final  . Troponin I 11/23/2016 <0.03  <0.03 ng/mL Final  . Sodium 11/24/2016 134* 135 - 145 mmol/L Final  . Potassium 11/24/2016 3.7  3.5 - 5.1 mmol/L Final  . Chloride 11/24/2016 102  101 - 111 mmol/L Final  . CO2 11/24/2016 26  22 - 32 mmol/L Final  . Glucose, Bld 11/24/2016 129* 65 - 99 mg/dL Final  . BUN 11/24/2016 10  6 - 20 mg/dL Final  . Creatinine, Ser 11/24/2016 1.06* 0.44 - 1.00 mg/dL Final  . Calcium 11/24/2016 8.5* 8.9 - 10.3 mg/dL Final  . GFR calc non Af Amer 11/24/2016 47* >60 mL/min Final  . GFR calc Af Amer 11/24/2016 54* >60 mL/min Final   Comment: (NOTE) The eGFR has been calculated using the CKD EPI equation. This calculation has not been validated in all clinical situations. eGFR's persistently <60 mL/min signify possible Chronic Kidney Disease.   . Anion gap 11/24/2016 6  5 - 15 Final  . WBC 11/24/2016 6.3  3.6 - 11.0 K/uL Final  . RBC 11/24/2016 3.29* 3.80 - 5.20 MIL/uL Final  . Hemoglobin 11/24/2016 9.7* 12.0 - 16.0 g/dL Final  . HCT 11/24/2016 28.9* 35.0 - 47.0 % Final  . MCV 11/24/2016 87.7  80.0 - 100.0 fL Final  . MCH 11/24/2016 29.5  26.0 - 34.0 pg Final  . MCHC 11/24/2016 33.7  32.0 - 36.0 g/dL Final  . RDW 11/24/2016 14.7* 11.5 - 14.5 % Final  . Platelets 11/24/2016 268  150 - 440 K/uL Final  . Glucose-Capillary 11/23/2016 134* 65 - 99 mg/dL Final  . Glucose-Capillary 11/23/2016 182* 65 - 99 mg/dL Final  . Comment 1 11/23/2016 Notify RN   Final  . Glucose-Capillary 11/24/2016 112* 65 - 99 mg/dL Final  . Glucose-Capillary 11/24/2016 141* 65 - 99 mg/dL Final  . Glucose-Capillary 11/24/2016 182* 65 - 99 mg/dL Final  .  Glucose-Capillary 11/24/2016 214* 65 - 99 mg/dL Final  . Comment 1 11/24/2016 Notify RN   Final  . Glucose-Capillary 11/25/2016 122* 65 - 99 mg/dL Final  . Comment 1 11/25/2016 Notify RN   Final  . Comment 2 11/25/2016 Document in Chart   Final  . Glucose-Capillary 11/25/2016 177* 65 - 99 mg/dL Final  . Comment 1 11/25/2016 Notify RN   Final  . Comment 2 11/25/2016 Document in Chart   Final    STUDIES: No results found.  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  PLAN:    1. Stage Ia ER/PR negative, HER-2 positive adenoarcinoma of the lower outer quadrant of the right breast:  No evidence of recurrent disease. Patient is status post right mastectomy on March 06, 2014.  Patient also received Taxol and Herceptin between August 2015 and to January 2016. Taxol was discontinued secondary to side effects. Herceptin was also subsequently discontinued in March 2016. Patient's most recent mammogram on February 19, 2017 was reported as BI-RADS 1, repeat in July 2019. She does  not require aromatase inhibitor given the ER/PR status of her tumor.  Return to clinic in 6 months with repeat laboratory work and further evaluation. 2. Joint pain: Chronic and unchanged. Continue treatment per primary care. 3. Anemia: Mild, monitor.  Patient expressed understanding and was in agreement with this plan. She also understands that She can call clinic at any time with any questions, concerns, or complaints.    Breast cancer, right, invasive ductal   Staging form: Breast, AJCC 7th Edition     Clinical: Stage IA (T1c, N0, M0) - Unsigned   Lloyd Huger, MD   04/08/2017 3:37 PM

## 2017-04-07 ENCOUNTER — Inpatient Hospital Stay: Payer: Medicare HMO | Attending: Oncology | Admitting: Oncology

## 2017-04-07 VITALS — BP 201/68 | HR 78 | Temp 97.3°F | Resp 18 | Wt 195.3 lb

## 2017-04-07 DIAGNOSIS — Z803 Family history of malignant neoplasm of breast: Secondary | ICD-10-CM | POA: Diagnosis not present

## 2017-04-07 DIAGNOSIS — D509 Iron deficiency anemia, unspecified: Secondary | ICD-10-CM | POA: Diagnosis not present

## 2017-04-07 DIAGNOSIS — Z794 Long term (current) use of insulin: Secondary | ICD-10-CM | POA: Diagnosis not present

## 2017-04-07 DIAGNOSIS — Z8673 Personal history of transient ischemic attack (TIA), and cerebral infarction without residual deficits: Secondary | ICD-10-CM

## 2017-04-07 DIAGNOSIS — G8929 Other chronic pain: Secondary | ICD-10-CM | POA: Diagnosis not present

## 2017-04-07 DIAGNOSIS — M25569 Pain in unspecified knee: Secondary | ICD-10-CM | POA: Insufficient documentation

## 2017-04-07 DIAGNOSIS — Z79899 Other long term (current) drug therapy: Secondary | ICD-10-CM | POA: Diagnosis not present

## 2017-04-07 DIAGNOSIS — Z8051 Family history of malignant neoplasm of kidney: Secondary | ICD-10-CM | POA: Insufficient documentation

## 2017-04-07 DIAGNOSIS — Z1501 Genetic susceptibility to malignant neoplasm of breast: Secondary | ICD-10-CM | POA: Diagnosis not present

## 2017-04-07 DIAGNOSIS — E1122 Type 2 diabetes mellitus with diabetic chronic kidney disease: Secondary | ICD-10-CM | POA: Insufficient documentation

## 2017-04-07 DIAGNOSIS — I130 Hypertensive heart and chronic kidney disease with heart failure and stage 1 through stage 4 chronic kidney disease, or unspecified chronic kidney disease: Secondary | ICD-10-CM | POA: Diagnosis not present

## 2017-04-07 DIAGNOSIS — D631 Anemia in chronic kidney disease: Secondary | ICD-10-CM | POA: Insufficient documentation

## 2017-04-07 DIAGNOSIS — Z9011 Acquired absence of right breast and nipple: Secondary | ICD-10-CM | POA: Insufficient documentation

## 2017-04-07 DIAGNOSIS — Z9221 Personal history of antineoplastic chemotherapy: Secondary | ICD-10-CM | POA: Insufficient documentation

## 2017-04-07 DIAGNOSIS — N189 Chronic kidney disease, unspecified: Secondary | ICD-10-CM | POA: Diagnosis not present

## 2017-04-07 DIAGNOSIS — Z7902 Long term (current) use of antithrombotics/antiplatelets: Secondary | ICD-10-CM | POA: Diagnosis not present

## 2017-04-07 DIAGNOSIS — Z171 Estrogen receptor negative status [ER-]: Secondary | ICD-10-CM | POA: Insufficient documentation

## 2017-04-07 DIAGNOSIS — C50511 Malignant neoplasm of lower-outer quadrant of right female breast: Secondary | ICD-10-CM | POA: Diagnosis not present

## 2017-04-07 DIAGNOSIS — I509 Heart failure, unspecified: Secondary | ICD-10-CM | POA: Insufficient documentation

## 2017-04-07 DIAGNOSIS — Z888 Allergy status to other drugs, medicaments and biological substances status: Secondary | ICD-10-CM | POA: Insufficient documentation

## 2017-06-07 ENCOUNTER — Encounter: Payer: Self-pay | Admitting: Emergency Medicine

## 2017-06-07 ENCOUNTER — Inpatient Hospital Stay: Payer: MEDICARE

## 2017-06-07 ENCOUNTER — Inpatient Hospital Stay
Admission: EM | Admit: 2017-06-07 | Discharge: 2017-06-09 | DRG: 641 | Disposition: A | Discharge status: Home / Self Care | Payer: MEDICARE | Attending: Internal Medicine | Admitting: Internal Medicine

## 2017-06-07 DIAGNOSIS — L89152 Pressure ulcer of sacral region, stage 2: Secondary | ICD-10-CM | POA: Diagnosis present

## 2017-06-07 DIAGNOSIS — I129 Hypertensive chronic kidney disease with stage 1 through stage 4 chronic kidney disease, or unspecified chronic kidney disease: Secondary | ICD-10-CM | POA: Diagnosis present

## 2017-06-07 DIAGNOSIS — Z79899 Other long term (current) drug therapy: Secondary | ICD-10-CM

## 2017-06-07 DIAGNOSIS — Z981 Arthrodesis status: Secondary | ICD-10-CM | POA: Diagnosis not present

## 2017-06-07 DIAGNOSIS — Z794 Long term (current) use of insulin: Secondary | ICD-10-CM

## 2017-06-07 DIAGNOSIS — Z803 Family history of malignant neoplasm of breast: Secondary | ICD-10-CM | POA: Diagnosis not present

## 2017-06-07 DIAGNOSIS — E871 Hypo-osmolality and hyponatremia: Secondary | ICD-10-CM | POA: Diagnosis present

## 2017-06-07 DIAGNOSIS — Z853 Personal history of malignant neoplasm of breast: Secondary | ICD-10-CM | POA: Diagnosis not present

## 2017-06-07 DIAGNOSIS — Z79891 Long term (current) use of opiate analgesic: Secondary | ICD-10-CM | POA: Diagnosis not present

## 2017-06-07 DIAGNOSIS — Z9011 Acquired absence of right breast and nipple: Secondary | ICD-10-CM

## 2017-06-07 DIAGNOSIS — Z8673 Personal history of transient ischemic attack (TIA), and cerebral infarction without residual deficits: Secondary | ICD-10-CM | POA: Diagnosis not present

## 2017-06-07 DIAGNOSIS — N179 Acute kidney failure, unspecified: Secondary | ICD-10-CM | POA: Diagnosis not present

## 2017-06-07 DIAGNOSIS — Z86718 Personal history of other venous thrombosis and embolism: Secondary | ICD-10-CM | POA: Diagnosis not present

## 2017-06-07 DIAGNOSIS — R062 Wheezing: Secondary | ICD-10-CM | POA: Diagnosis present

## 2017-06-07 DIAGNOSIS — E785 Hyperlipidemia, unspecified: Secondary | ICD-10-CM | POA: Diagnosis present

## 2017-06-07 DIAGNOSIS — E1122 Type 2 diabetes mellitus with diabetic chronic kidney disease: Secondary | ICD-10-CM | POA: Diagnosis present

## 2017-06-07 DIAGNOSIS — Z7902 Long term (current) use of antithrombotics/antiplatelets: Secondary | ICD-10-CM | POA: Diagnosis not present

## 2017-06-07 DIAGNOSIS — Z9221 Personal history of antineoplastic chemotherapy: Secondary | ICD-10-CM

## 2017-06-07 DIAGNOSIS — L8990 Pressure ulcer of unspecified site, unspecified stage: Secondary | ICD-10-CM | POA: Insufficient documentation

## 2017-06-07 DIAGNOSIS — N183 Chronic kidney disease, stage 3 (moderate): Secondary | ICD-10-CM | POA: Diagnosis not present

## 2017-06-07 LAB — CBC
HCT: 31.3 % — ABNORMAL LOW (ref 35.0–47.0)
HEMOGLOBIN: 10.6 g/dL — AB (ref 12.0–16.0)
MCH: 30.1 pg (ref 26.0–34.0)
MCHC: 33.9 g/dL (ref 32.0–36.0)
MCV: 88.7 fL (ref 80.0–100.0)
PLATELETS: 292 10*3/uL (ref 150–440)
RBC: 3.53 MIL/uL — AB (ref 3.80–5.20)
RDW: 14.6 % — ABNORMAL HIGH (ref 11.5–14.5)
WBC: 5.1 10*3/uL (ref 3.6–11.0)

## 2017-06-07 LAB — BASIC METABOLIC PANEL
ANION GAP: 8 (ref 5–15)
BUN: 22 mg/dL — ABNORMAL HIGH (ref 6–20)
CHLORIDE: 91 mmol/L — AB (ref 101–111)
CO2: 24 mmol/L (ref 22–32)
CREATININE: 1.54 mg/dL — AB (ref 0.44–1.00)
Calcium: 8.6 mg/dL — ABNORMAL LOW (ref 8.9–10.3)
GFR calc non Af Amer: 30 mL/min — ABNORMAL LOW (ref >60)
GFR, EST AFRICAN AMERICAN: 34 mL/min — AB (ref >60)
Glucose, Bld: 151 mg/dL — ABNORMAL HIGH (ref 65–99)
POTASSIUM: 4.4 mmol/L (ref 3.5–5.1)
SODIUM: 123 mmol/L — AB (ref 135–145)

## 2017-06-07 LAB — OSMOLALITY, URINE: OSMOLALITY UR: 197 mosm/kg — AB (ref 300–900)

## 2017-06-07 LAB — GLUCOSE, CAPILLARY
GLUCOSE-CAPILLARY: 97 mg/dL (ref 65–99)
GLUCOSE-CAPILLARY: 166 mg/dL — AB (ref 65–99)

## 2017-06-07 LAB — NA AND K (SODIUM & POTASSIUM), RAND UR
POTASSIUM UR: 9 mmol/L
Sodium, Ur: 71 mmol/L

## 2017-06-07 LAB — TROPONIN I: Troponin I: <0.03 ng/mL (ref <0.03)

## 2017-06-07 MED ORDER — ACETAMINOPHEN 325 MG PO TABS
650.0000 mg | ORAL_TABLET | Freq: Four times a day (QID) | ORAL | Status: DC | PRN
  Administered 2017-06-07 – 2017-06-09 (×3): 650 mg via ORAL
  Filled 2017-06-07 (×3): qty 2

## 2017-06-07 MED ORDER — SODIUM CHLORIDE 0.9 % IV SOLN
INTRAVENOUS | Status: DC
Start: 2017-06-07 — End: 2017-06-09
  Administered 2017-06-07 – 2017-06-08 (×2): via INTRAVENOUS

## 2017-06-07 MED ORDER — IPRATROPIUM-ALBUTEROL 0.5-2.5 (3) MG/3ML IN SOLN
3.0000 mL | Freq: Four times a day (QID) | RESPIRATORY_TRACT | Status: DC
  Administered 2017-06-07 – 2017-06-08 (×5): 3 mL via RESPIRATORY_TRACT
  Filled 2017-06-07 (×5): qty 3

## 2017-06-07 MED ORDER — CLOPIDOGREL BISULFATE 75 MG PO TABS
75.0000 mg | ORAL_TABLET | Freq: Every day | ORAL | Status: DC
  Administered 2017-06-08 – 2017-06-09 (×2): 75 mg via ORAL
  Filled 2017-06-07 (×2): qty 1

## 2017-06-07 MED ORDER — GABAPENTIN 600 MG PO TABS
600.0000 mg | ORAL_TABLET | Freq: Three times a day (TID) | ORAL | Status: DC
  Administered 2017-06-07 – 2017-06-09 (×6): 600 mg via ORAL
  Filled 2017-06-07 (×8): qty 1

## 2017-06-07 MED ORDER — MELATONIN 5 MG PO TABS
5.0000 mg | ORAL_TABLET | Freq: Every day | ORAL | Status: DC
  Administered 2017-06-07 – 2017-06-08 (×2): 5 mg via ORAL
  Filled 2017-06-07 (×3): qty 1

## 2017-06-07 MED ORDER — ACETAMINOPHEN 650 MG RE SUPP: 650.0000 mg | Freq: Four times a day (QID) | RECTAL | Status: DC | PRN

## 2017-06-07 MED ORDER — SODIUM CHLORIDE 0.9 % IV BOLUS (SEPSIS)
1000.0000 mL | Freq: Once | INTRAVENOUS | Status: AC
  Administered 2017-06-07: 1000 mL via INTRAVENOUS

## 2017-06-07 MED ORDER — PRAVASTATIN SODIUM 40 MG PO TABS
80.0000 mg | ORAL_TABLET | Freq: Every evening | ORAL | Status: DC
  Administered 2017-06-07 – 2017-06-08 (×2): 80 mg via ORAL
  Filled 2017-06-07: qty 4
  Filled 2017-06-07: qty 2
  Filled 2017-06-07: qty 4
  Filled 2017-06-07: qty 2
  Filled 2017-06-07: qty 1

## 2017-06-07 MED ORDER — INSULIN ASPART 100 UNIT/ML ~~LOC~~ SOLN
8.0000 [IU] | Freq: Three times a day (TID) | SUBCUTANEOUS | Status: DC
  Administered 2017-06-08 – 2017-06-09 (×3): 8 [IU] via SUBCUTANEOUS
  Filled 2017-06-07 (×3): qty 1

## 2017-06-07 MED ORDER — ENOXAPARIN SODIUM 40 MG/0.4ML ~~LOC~~ SOLN
30.0000 mg | SUBCUTANEOUS | Status: DC
  Administered 2017-06-07: 30 mg via SUBCUTANEOUS
  Filled 2017-06-07: qty 0.4

## 2017-06-07 MED ORDER — INSULIN DETEMIR 100 UNIT/ML ~~LOC~~ SOLN
18.0000 [IU] | Freq: Every day | SUBCUTANEOUS | Status: DC
  Administered 2017-06-07 – 2017-06-08 (×2): 18 [IU] via SUBCUTANEOUS
  Filled 2017-06-07 (×3): qty 0.18

## 2017-06-07 MED ORDER — HYDROCODONE-ACETAMINOPHEN 5-325 MG PO TABS: 1.0000 | ORAL_TABLET | Freq: Four times a day (QID) | ORAL | Status: DC | PRN

## 2017-06-07 MED ORDER — HYDRALAZINE HCL 50 MG PO TABS
50.0000 mg | ORAL_TABLET | Freq: Three times a day (TID) | ORAL | Status: DC
  Administered 2017-06-07 – 2017-06-09 (×6): 50 mg via ORAL
  Filled 2017-06-07 (×6): qty 1

## 2017-06-07 MED ORDER — METOPROLOL SUCCINATE ER 50 MG PO TB24
100.0000 mg | ORAL_TABLET | Freq: Every day | ORAL | Status: DC
  Administered 2017-06-08 – 2017-06-09 (×2): 100 mg via ORAL
  Filled 2017-06-07 (×2): qty 2

## 2017-06-07 MED ORDER — ONDANSETRON HCL 4 MG PO TABS: 4.0000 mg | ORAL_TABLET | Freq: Three times a day (TID) | ORAL | Status: DC | PRN

## 2017-06-07 NOTE — Progress Notes (Addendum)
Pharmacist - Prescriber Communication  Enoxaparin dose modified to 30 mg subcutaneously once daily due to CrCl less than 30 mL/min.  Nathan A. [place], [place].D., BCPS Clinical Pharmacist 06/07/2017 15:14  10/30 0600 Changed back to 40 mg daily for CrCl now est >30 mL/min.  Sim Boast, PharmD, BCPS  06/08/17 5:48 AM

## 2017-06-07 NOTE — ED Triage Notes (Signed)
Sent from [place] drew-dr sallie patel--for low sodium. 120 on 10/25.  Pt says she is weak all over and feels dizzy and off balance worsening for the last 2 weeks

## 2017-06-07 NOTE — H&P (Signed)
[place] [facility] [place] NAME: Kristen Barron    MR#:  [id number]  DATE OF BIRTH:  [birth year]  DATE OF ADMISSION:  06/07/2017  PRIMARY CARE PHYSICIAN: Denton Lank, MD   REQUESTING/REFERRING PHYSICIAN: Dr Lisa Roca  CHIEF COMPLAINT:   Chief Complaint  Patient presents with  . Dizziness  . Weakness    HISTORY OF PRESENT ILLNESS:  Kristen Barron  is a 83 y.o. female with a known history of feeling dizzy and told to come in because her sodium was low.  Lately she has been having a poor appetite.  She is not been feeling well.  As outpatient, she had a sodium level that was low.  The patient was recently started on a water pill.  In the ER, her sodium was 123 and hospitalist services were contacted for further evaluation.  PAST MEDICAL HISTORY:   Past Medical History:  Diagnosis Date  . Arthritis   . Breast cancer ([place]) 03/06/2014   right breast with mastectomy and chemo  . Cancer [facility])    Reports lumpectomy for a cyst  . CHF (congestive heart failure) ([place])   . Collagen vascular disease ([place])   . Diabetes ([place])   . DVT (deep venous thrombosis) ([place])   . Hemorrhoids   . Hypertension   . Renal disorder   . Renal insufficiency   . Stroke [facility])    August 2014, but has had strokes prior as well    PAST SURGICAL HISTORY:   Past Surgical History:  Procedure Laterality Date  . ABDOMINAL HYSTERECTOMY     Patient not clear as to why  . BACK SURGERY    . BREAST BIOPSY Right February 15 2014   invasive mammary carcinoma/ER/PR negative, HER 2 positive  . BREAST BIOPSY Right 1999   negative biopsy  . BREAST SURGERY Right 03/06/14   mastectomy  . COLONOSCOPY WITH PROPOFOL N/A 04/15/2016   Procedure: COLONOSCOPY WITH PROPOFOL;  Surgeon: Manya Silvas, MD;  Location: [facility] ENDOSCOPY;  Service: Endoscopy;  Laterality: N/A;  . ESOPHAGOGASTRODUODENOSCOPY (EGD) WITH PROPOFOL N/A 04/15/2016   Procedure: ESOPHAGOGASTRODUODENOSCOPY (EGD) WITH  PROPOFOL;  Surgeon: Manya Silvas, MD;  Location: [facility] ENDOSCOPY;  Service: Endoscopy;  Laterality: N/A;  . HAND SURGERY Right    Carpel tunnel release in the 1970s  . LUMBAR LAMINECTOMY/DECOMPRESSION MICRODISCECTOMY Bilateral 07/24/2013   Procedure: LUMBAR LAMINECTOMY/DECOMPRESSION MICRODISCECTOMY LUMBAR THREE-FOUR;  Surgeon: Charlie Pitter, MD;  Location: [place] NEURO ORS;  Service: Neurosurgery;  Laterality: Bilateral;  . MASTECTOMY Right 03/06/2014   right br ca    SOCIAL HISTORY:   Social History  Substance Use Topics  . Smoking status: Never Smoker  . Smokeless tobacco: Never Used  . Alcohol use No    FAMILY HISTORY:   Family History  Problem Relation Age of Onset  . CAD Father   . Cancer Sister 88       breast  . Breast cancer Sister 46  . Cancer Brother        kidney  . Cancer Maternal Aunt        breast  . Breast cancer Maternal Aunt 70  . Cancer Other        maternal niece with breast cancer  . Breast cancer Other   . Cancer Sister        pancreatic    DRUG ALLERGIES:  No Known Allergies  REVIEW OF SYSTEMS:  CONSTITUTIONAL: No fever, chills or sweats.  Positive for weakness.  EYES: Positive blurred vision with reading.  She does wear reading glasses.   EARS, NOSE, AND THROAT: No tinnitus or ear pain. No sore throat RESPIRATORY: No cough, positive for shortness of breath with exertion, no wheezing or hemoptysis.  CARDIOVASCULAR: No chest pain, orthopnea, edema.  GASTROINTESTINAL: No nausea, vomiting, diarrhea or abdominal pain.  2 weeks ago had one episode of blood in bowel movements GENITOURINARY: No dysuria, hematuria.  2 weeks ago had a urinary tract infection ENDOCRINE: No polyuria, nocturia,  HEMATOLOGY: No anemia, easy bruising or bleeding SKIN: No rash or lesion. MUSCULOSKELETAL: No joint pain or arthritis.   NEUROLOGIC: No tingling, numbness, weakness.  PSYCHIATRY: No anxiety or depression.   MEDICATIONS AT HOME:   Prior to Admission medications    Medication Sig Start Date End Date Taking? Authorizing Provider  benazepril (LOTENSIN) 20 MG tablet Take 20 mg by mouth daily. 10/15/16  Yes [provider]  clopidogrel (PLAVIX) 75 MG tablet Take 75 mg by mouth daily with breakfast.   Yes [provider]  gabapentin (NEURONTIN) 600 MG tablet  11/25/16  Yes [provider]  Glucosamine-Chondroit-Vit C-Mn (GLUCOSAMINE 1500 COMPLEX PO) Take by mouth 3 (three) times daily.   Yes [provider]  hydrALAZINE (APRESOLINE) 50 MG tablet Take 50 mg by mouth 3 (three) times daily.    Yes [provider]  insulin aspart (NOVOLOG) 100 UNIT/ML injection Inject 8 Units into the skin 3 (three) times daily before meals.   Yes [provider]  insulin detemir (LEVEMIR) 100 UNIT/ML injection Inject 18 Units into the skin at bedtime.   Yes [provider]  Melatonin 5 MG TABS Take 5 mg by mouth at bedtime.   Yes [provider]  metoprolol succinate (TOPROL-XL) 100 MG 24 hr tablet Take 100 mg by mouth daily. Take with or immediately following a meal.   Yes [provider]  Omega 3 1200 MG CAPS Take by mouth daily.    Yes [provider]  pravastatin (PRAVACHOL) 80 MG tablet Take 80 mg by mouth every evening.    Yes [provider]  torsemide (DEMADEX) 20 MG tablet Take 20 mg by mouth 2 (two) times daily.    Yes [provider]  acetaminophen (TYLENOL) 500 MG tablet Take 500 mg by mouth every 6 (six) hours as needed.    [provider]  dicyclomine (BENTYL) 20 MG tablet Take 1 tablet (20 mg total) by mouth 4 (four) times daily -  before meals and at bedtime. Patient not taking: Reported on 06/07/2017 04/15/16   Loletha Grayer, MD  HYDROcodone-acetaminophen (NORCO/VICODIN) 5-325 MG per tablet Take 1 tablet by mouth every 6 (six) hours as needed.  02/16/14   [provider]  hyoscyamine (LEVSIN SL) 0.125 MG SL tablet Place 1 tablet (0.125 mg total)  under the tongue every 6 (six) hours as needed for cramping. Patient not taking: Reported on 06/07/2017 04/15/16   Loletha Grayer, MD  lisinopril (PRINIVIL,ZESTRIL) 20 MG tablet Take 1 tablet (20 mg total) by mouth daily. Patient not taking: Reported on 06/07/2017 04/15/16   Loletha Grayer, MD  ondansetron (ZOFRAN) 4 MG tablet Take 4 mg by mouth every 8 (eight) hours as needed for nausea or vomiting.    [provider]  pantoprazole (PROTONIX) 40 MG tablet Take 1 tablet (40 mg total) by mouth 2 (two) times daily before a meal. Patient not taking: Reported on 06/07/2017 04/09/16   Hillary Bow, MD  polyethylene glycol (MIRALAX / Floria Raveling)  packet Take 17 g by mouth daily. Patient not taking: Reported on 06/07/2017 04/15/16   Loletha Grayer, MD  traMADol (ULTRAM) 50 MG tablet Take 1 tablet (50 mg total) by mouth every 12 (twelve) hours as needed for severe pain. Patient not taking: Reported on 06/07/2017 04/09/16   Hillary Bow, MD      VITAL SIGNS:  Blood pressure (!) 151/54, pulse 70, temperature 98.1 F (36.7 C), temperature source Oral, resp. rate 14, height 5\' 1"  (1.549 m), weight 86.2 kg (190 lb 0.6 oz), SpO2 96 %.  PHYSICAL EXAMINATION:  GENERAL:  83 y.o.-year-old patient lying in the bed with no acute distress.  EYES: Pupils equal, round, reactive to light and accommodation. No scleral icterus. Extraocular muscles intact.  HEENT: Head atraumatic, normocephalic. Oropharynx and nasopharynx clear.  NECK:  Supple, no jugular venous distention. No thyroid enlargement, no tenderness.  LUNGS: Normal breath sounds bilaterally, slight expiratory wheezing, no rales,rhonchi or crepitation. No use of accessory muscles of respiration.  CARDIOVASCULAR: S1, S2 normal. No murmurs, rubs, or gallops.  ABDOMEN: Soft, nontender, nondistended. Bowel sounds present. No organomegaly or mass.  EXTREMITIES: 2+ edema, no cyanosis, or clubbing.  NEUROLOGIC: Cranial nerves II through XII are intact.  Muscle strength 5/5 in all extremities. Sensation intact. Gait not checked.  PSYCHIATRIC: The patient is alert and oriented x 3.  SKIN: No rash, lesion, or ulcer.   LABORATORY PANEL:   CBC  Recent Labs Lab 06/07/17 1126  WBC 5.1  HGB 10.6*  HCT 31.3*  PLT 292   ------------------------------------------------------------------------------------------------------------------  Chemistries   Recent Labs Lab 06/07/17 1126  NA 123*  K 4.4  CL 91*  CO2 24  GLUCOSE 151*  BUN 22*  CREATININE 1.54*  CALCIUM 8.6*   ------------------------------------------------------------------------------------------------------------------  Cardiac Enzymes  Recent Labs Lab 06/07/17 1126  TROPONINI <0.03   ------------------------------------------------------------------------------------------------------------------  RADIOLOGY:  Chest x-ray ordered by me  EKG:   Normal sinus rhythm 91 bpm no acute ST-T wave changes  IMPRESSION AND PLAN:   1.  Hyponatremia.  Likely with over diuresis.  Gentle IV fluid hydration.  Obtain a chest x-ray.  Hold torsemide.  Check orthostatic vital signs.  I am considering a dose of tolvaptan, depending on chest x-ray results. 2.  Essential hypertension.  Continue hydralazine.  Hold benazepril and lisinopril unsure which one she is taking. 3.  Hyperlipidemia unspecified on Pravachol 4.  Type 2 diabetes mellitus on Levemir insulin and NovoLog sliding scale 5.  History of CVA on aspirin and Plavix 6.  Wheeze.  Start nebulizer treatments.  Obtain a chest x-ray    All the records are reviewed and case discussed with ED provider. Management plans discussed with the patient, family and they are in agreement.  CODE STATUS: full code  TOTAL TIME TAKING CARE OF THIS PATIENT: 50 minutes.    Loletha Grayer M.D on 06/07/2017 at 2:49 PM  Between 7am to 6pm - Pager - [phone]  After 6pm call admission pager [phone]  [facility]   [phone]  CC: Primary care physician; Denton Lank, MD

## 2017-06-07 NOTE — ED Provider Notes (Signed)
[facility] Emergency Department Provider Note ____________________________________________   I have reviewed the triage vital signs and the triage nursing note.  HISTORY  Chief Complaint Dizziness and Weakness   Historian Patient and granddaughter  HPI Kristen Barron is a 83 y.o. female with a feeling of dizziness and fatigue and nausea since Thursday and she saw her primary care doctor who drew blood work and had a return appointment this Monday today.  She went to her follow-up appointment and was told that her sodium level was 120 and department for further management.  She is on torsemide as well as benazepril.  Does not sound like she has had issues with low sodium before.  No chest pain.  She did have a urinary tract infection a couple weeks ago and completed robotics.  She still intermittently having burning at the end of urination.   Past Medical History:  Diagnosis Date  . Arthritis   . Breast cancer ([place]) 03/06/2014   right breast with mastectomy and chemo  . Cancer [facility])    Reports lumpectomy for a cyst  . CHF (congestive heart failure) ([place])   . Collagen vascular disease ([place])   . Diabetes ([place])   . DVT (deep venous thrombosis) ([place])   . Hemorrhoids   . Hypertension   . Renal disorder   . Renal insufficiency   . Stroke [facility])    August 2014, but has had strokes prior as well    Patient Active Problem List   Diagnosis Date Noted  . Abdominal pain 04/09/2016  . Right lower quadrant pain   . Vomiting   . Gallstone   . Osteoarthritis of knee (Bilateral) (L>R) 12/26/2015  . Chronic pain 12/11/2015  . Long term current use of opiate analgesic 12/11/2015  . Encounter for therapeutic drug level monitoring 12/11/2015  . Chronic knee pain (Location of Primary Source of Pain) (Bilateral) (L>R) 12/11/2015  . Long term prescription opiate use 12/11/2015  . Opiate use (20 MME/Day) 12/11/2015  . Chronic hand pain (Location of Secondary source  of pain) (Bilateral) (L>R) 12/11/2015  . Failed back surgical syndrome x 2 (Surgeon: Dr. Deri Fuelling) 12/11/2015  . Lumbar spondylosis 12/11/2015  . Chronic low back pain (Location of Tertiary source of pain) (Bilateral) (L>R) 12/11/2015  . Lumbar facet syndrome 12/11/2015  . Chronic lower extremity pain (Bilateral) (L>R) 12/11/2015  . Chronic neck pain (Left) 12/11/2015  . Cervical spondylosis 12/11/2015  . Chronic shoulder pain (Right) 12/11/2015  . Insulin dependent diabetes mellitus ([place]) 12/11/2015  . Abnormal MRI, lumbar spine (07/19/2013) 12/11/2015  . Lumbar postlaminectomy syndrome (L4-5) 12/11/2015  . Fusion of lumbar spine (L4-5 Ray cages) 12/11/2015  . Lumbar Levoscoliosis with apex at L4 12/11/2015  . Grade 1 Retrolisthesis of L2 over L3 and L3 over L4 12/11/2015  . Lumbar facet arthropathy 12/11/2015  . Lumbar foraminal stenosis (Bilateral L2-3, L3-4 & Left L5-S1) 12/11/2015  . Chronic anticoagulation (Plavix) 12/11/2015  . Breast cancer of lower-outer quadrant of right female breast ([place]) 02/28/2014  . Lumbar stenosis with neurogenic claudication 07/24/2013  . Lumbar central spinal stenosis ( Severe at L3-4) (Mild R>L L2-3 & L5-S1) 07/20/2013  . Hyponatremia 07/20/2013  . Hypertension 07/20/2013  . Normocytic anemia 07/20/2013  . CKD (chronic kidney disease) stage 3, GFR 30-59 ml/min (HCC) 07/20/2013  .  Lumbar DDD (degenerative disc disease) 07/20/2013    Past Surgical History:  Procedure Laterality Date  . ABDOMINAL HYSTERECTOMY     Patient not clear as to  why  . BACK SURGERY    . BREAST BIOPSY Right February 15 2014   invasive mammary carcinoma/ER/PR negative, HER 2 positive  . BREAST BIOPSY Right 1999   negative biopsy  . BREAST SURGERY Right 03/06/14   mastectomy  . COLONOSCOPY WITH PROPOFOL N/A 04/15/2016   Procedure: COLONOSCOPY WITH PROPOFOL;  Surgeon: Manya Silvas, MD;  Location: [facility] ENDOSCOPY;  Service: Endoscopy;  Laterality: N/A;  .  ESOPHAGOGASTRODUODENOSCOPY (EGD) WITH PROPOFOL N/A 04/15/2016   Procedure: ESOPHAGOGASTRODUODENOSCOPY (EGD) WITH PROPOFOL;  Surgeon: Manya Silvas, MD;  Location: [facility] ENDOSCOPY;  Service: Endoscopy;  Laterality: N/A;  . HAND SURGERY Right    Carpel tunnel release in the 1970s  . LUMBAR LAMINECTOMY/DECOMPRESSION MICRODISCECTOMY Bilateral 07/24/2013   Procedure: LUMBAR LAMINECTOMY/DECOMPRESSION MICRODISCECTOMY LUMBAR THREE-FOUR;  Surgeon: Charlie Pitter, MD;  Location: [place] NEURO ORS;  Service: Neurosurgery;  Laterality: Bilateral;  . MASTECTOMY Right 03/06/2014   right br ca    Prior to Admission medications   Medication Sig Start Date End Date Taking? Authorizing Provider  benazepril (LOTENSIN) 20 MG tablet Take 20 mg by mouth daily. 10/15/16  Yes [provider]  clopidogrel (PLAVIX) 75 MG tablet Take 75 mg by mouth daily with breakfast.   Yes [provider]  gabapentin (NEURONTIN) 600 MG tablet  11/25/16  Yes [provider]  Glucosamine-Chondroit-Vit C-Mn (GLUCOSAMINE 1500 COMPLEX PO) Take by mouth 3 (three) times daily.   Yes [provider]  hydrALAZINE (APRESOLINE) 50 MG tablet Take 50 mg by mouth 3 (three) times daily.    Yes [provider]  insulin aspart (NOVOLOG) 100 UNIT/ML injection Inject 8 Units into the skin 3 (three) times daily before meals.   Yes [provider]  insulin detemir (LEVEMIR) 100 UNIT/ML injection Inject 18 Units into the skin at bedtime.   Yes [provider]  Melatonin 5 MG TABS Take 5 mg by mouth at bedtime.   Yes [provider]  metoprolol succinate (TOPROL-XL) 100 MG 24 hr tablet Take 100 mg by mouth daily. Take with or immediately following a meal.   Yes [provider]  Omega 3 1200 MG CAPS Take by mouth daily.    Yes [provider]  pravastatin (PRAVACHOL) 80 MG tablet Take 80 mg by mouth every evening.    Yes [provider]  torsemide (DEMADEX) 20 MG tablet  Take 20 mg by mouth 2 (two) times daily.    Yes [provider]  acetaminophen (TYLENOL) 500 MG tablet Take 500 mg by mouth every 6 (six) hours as needed.    [provider]  dicyclomine (BENTYL) 20 MG tablet Take 1 tablet (20 mg total) by mouth 4 (four) times daily -  before meals and at bedtime. Patient not taking: Reported on 06/07/2017 04/15/16   Loletha Grayer, MD  HYDROcodone-acetaminophen (NORCO/VICODIN) 5-325 MG per tablet Take 1 tablet by mouth every 6 (six) hours as needed.  02/16/14   [provider]  hyoscyamine (LEVSIN SL) 0.125 MG SL tablet Place 1 tablet (0.125 mg total) under the tongue every 6 (six) hours as needed for cramping. Patient not taking: Reported on 06/07/2017 04/15/16   Loletha Grayer, MD  lisinopril (PRINIVIL,ZESTRIL) 20 MG tablet Take 1 tablet (20 mg total) by mouth daily. Patient not taking: Reported on 06/07/2017 04/15/16   Loletha Grayer, MD  ondansetron (ZOFRAN) 4 MG tablet Take 4 mg by mouth every 8 (eight) hours as needed for nausea or vomiting.    [provider]  pantoprazole (PROTONIX) 40 MG tablet Take 1 tablet (40 mg total) by mouth 2 (two) times daily before a meal. Patient not taking: Reported on 06/07/2017 04/09/16   Hillary Bow, MD  polyethylene glycol (MIRALAX / GLYCOLAX) packet Take 17 g by mouth daily. Patient not taking: Reported on 06/07/2017 04/15/16   Loletha Grayer, MD  traMADol (ULTRAM) 50 MG tablet Take 1 tablet (50 mg total) by mouth every 12 (twelve) hours as needed for severe pain. Patient not taking: Reported on 06/07/2017 04/09/16   Hillary Bow, MD    No Known Allergies  Family History  Problem Relation Age of Onset  . Cancer Sister 71       breast  . Breast cancer Sister 84  . Cancer Brother        kidney  . Cancer Maternal Aunt        breast  . Breast cancer Maternal Aunt 70  . Cancer Other        maternal niece with breast cancer  . Breast cancer Other   . Cancer Sister         pancreatic    Social History Social History  Substance Use Topics  . Smoking status: Never Smoker  . Smokeless tobacco: Never Used  . Alcohol use No    Review of Systems  Constitutional: Negative for fever. Eyes: Negative for visual changes. ENT: Negative for sore throat. Cardiovascular: Negative for chest pain. Respiratory: Negative for shortness of breath. Gastrointestinal: Negative for abdominal pain, vomiting and diarrhea. Genitourinary: Negative for dysuria. Musculoskeletal: Negative for back pain. Skin: Negative for rash. Neurological: Negative for headache.  ____________________________________________   PHYSICAL EXAM:  VITAL SIGNS: ED Triage Vitals  Enc Vitals Group     BP 06/07/17 1058 (!) 159/48     Pulse Rate 06/07/17 1058 90     Resp 06/07/17 1058 14     Temp 06/07/17 1058 98.1 F (36.7 C)     Temp Source 06/07/17 1058 Oral     SpO2 06/07/17 1058 92 %     Weight 06/07/17 1058 190 lb 0.6 oz (86.2 kg)     Height 06/07/17 1058 5\' 1"  (1.549 m)     Head Circumference --      Peak Flow --      Pain Score 06/07/17 1215 0     Pain Loc --      Pain Edu? --      Excl. in [place]? --      Constitutional: Alert and oriented. Well appearing and in no distress. HEENT   Head: Normocephalic and atraumatic.      Eyes: Conjunctivae are normal. Pupils equal and round.       Ears:         Nose: No congestion/rhinnorhea.   Mouth/Throat: Mucous membranes are moist.   Neck: No stridor. Cardiovascular/Chest: Normal rate, regular rhythm.  No murmurs, rubs, or gallops. Respiratory: Normal respiratory effort without tachypnea nor retractions. Breath sounds are clear and equal bilaterally. No wheezes/rales/rhonchi. Gastrointestinal: Soft. No distention, no guarding, no rebound. Nontender.    Genitourinary/rectal:Deferred Musculoskeletal: Nontender with normal range of motion in all extremities. No joint effusions.  No lower extremity tenderness.  No  edema. Neurologic:  Normal speech and language. No gross or focal neurologic deficits are appreciated. Skin:  Skin is warm, dry and intact. No rash noted. Psychiatric: Mood and affect are normal. Speech and behavior are normal. Patient exhibits appropriate insight and judgment.   ____________________________________________  LABS (pertinent positives/negatives) I, Wells Guiles  Zoriana Oats, MD the attending physician have reviewed the labs noted below.  Labs Reviewed  BASIC METABOLIC PANEL - Abnormal; Notable for the following:       Result Value   Sodium 123 (*)    Chloride 91 (*)    Glucose, Bld 151 (*)    BUN 22 (*)    Creatinine, Ser 1.54 (*)    Calcium 8.6 (*)    GFR calc non Af Amer 30 (*)    GFR calc Af Amer 34 (*)    All other components within normal limits  CBC - Abnormal; Notable for the following:    RBC 3.53 (*)    Hemoglobin 10.6 (*)    HCT 31.3 (*)    RDW 14.6 (*)    All other components within normal limits  TROPONIN I    ____________________________________________    EKG I, Lisa Roca, MD, the attending physician have personally viewed and interpreted all ECGs.  91 bpm.  Normal sinus rhythm.  Narrow QS.  Normal axis.  Normal ST ____________________________________________  RADIOLOGY All Xrays were viewed by me.  Imaging interpreted by Radiologist, and I, Lisa Roca, MD the attending physician have reviewed the radiologist interpretation noted below.  None __________________________________________  PROCEDURES  Procedure(s) performed: None  Critical Care performed: None  ____________________________________________  Current Facility-Administered Medications on File Prior to Encounter  Medication Dose Route Frequency Provider Last Rate Last Dose  . sodium chloride flush (NS) 0.9 % injection 10 mL  10 mL Intravenous PRN Lloyd Huger, MD   10 mL at 09/23/16 1103   Current Outpatient Prescriptions on File Prior to Encounter  Medication Sig  Dispense Refill  . benazepril (LOTENSIN) 20 MG tablet Take 20 mg by mouth daily.    . clopidogrel (PLAVIX) 75 MG tablet Take 75 mg by mouth daily with breakfast.    . gabapentin (NEURONTIN) 600 MG tablet     . Glucosamine-Chondroit-Vit C-Mn (GLUCOSAMINE 1500 COMPLEX PO) Take by mouth 3 (three) times daily.    . hydrALAZINE (APRESOLINE) 50 MG tablet Take 50 mg by mouth 3 (three) times daily.     . insulin aspart (NOVOLOG) 100 UNIT/ML injection Inject 8 Units into the skin 3 (three) times daily before meals.    . insulin detemir (LEVEMIR) 100 UNIT/ML injection Inject 18 Units into the skin at bedtime.    . Melatonin 5 MG TABS Take 5 mg by mouth at bedtime.    . metoprolol succinate (TOPROL-XL) 100 MG 24 hr tablet Take 100 mg by mouth daily. Take with or immediately following a meal.    . Omega 3 1200 MG CAPS Take by mouth daily.     . pravastatin (PRAVACHOL) 80 MG tablet Take 80 mg by mouth every evening.     . torsemide (DEMADEX) 20 MG tablet Take 20 mg by mouth 2 (two) times daily.     Marland Kitchen acetaminophen (TYLENOL) 500 MG tablet Take 500 mg by mouth every 6 (six) hours as needed.    . dicyclomine (BENTYL) 20 MG tablet Take 1 tablet (20 mg total) by mouth 4 (four) times daily -  before meals and at bedtime. (Patient not taking: Reported on 06/07/2017) 120 tablet 0  . HYDROcodone-acetaminophen (NORCO/VICODIN) 5-325 MG per tablet Take 1 tablet by mouth every 6 (six) hours as needed.     . hyoscyamine (LEVSIN SL) 0.125 MG SL tablet Place 1 tablet (0.125 mg total) under the tongue every 6 (six) hours as needed for cramping. (Patient not taking:  Reported on 06/07/2017) 30 tablet 0  . lisinopril (PRINIVIL,ZESTRIL) 20 MG tablet Take 1 tablet (20 mg total) by mouth daily. (Patient not taking: Reported on 06/07/2017) 30 tablet 0  . ondansetron (ZOFRAN) 4 MG tablet Take 4 mg by mouth every 8 (eight) hours as needed for nausea or vomiting.    . pantoprazole (PROTONIX) 40 MG tablet Take 1 tablet (40 mg total) by  mouth 2 (two) times daily before a meal. (Patient not taking: Reported on 06/07/2017) 60 tablet 0  . polyethylene glycol (MIRALAX / GLYCOLAX) packet Take 17 g by mouth daily. (Patient not taking: Reported on 06/07/2017) 30 each 0  . traMADol (ULTRAM) 50 MG tablet Take 1 tablet (50 mg total) by mouth every 12 (twelve) hours as needed for severe pain. (Patient not taking: Reported on 06/07/2017) 15 tablet 0    ____________________________________________  ED COURSE / ASSESSMENT AND PLAN  Pertinent labs & imaging results that were available during my care of the patient were reviewed by me and considered in my medical decision making (see chart for details).    Ms. Jordahl presented with blood work from several days ago sodium 120, previously no issues with hyponatremia.  Today it is confirmed still at 123.  Patient also has mild acute renal failure with mild bump in BUN and creatinine.  Patient started on IV fluid supplementation for treatment of hyponatremia.  Suspect possible sources torsemide, benazepril, however they states she has been on this medications for quite some time.      CONSULTATIONS:   Hospitalist for admission.   Patient / Family / Caregiver informed of clinical course, medical decision-making process, and agree with plan.  ___________________________________________   FINAL CLINICAL IMPRESSION(S) / ED DIAGNOSES   Final diagnoses:  Hyponatremia  Acute renal failure, unspecified acute renal failure type [facility])              Note: This dictation was prepared with Dragon dictation. Any transcriptional errors that result from this process are unintentional    Lisa Roca, MD 06/07/17 1348

## 2017-06-07 NOTE — ED Notes (Signed)
Sent by doctor , dizzy/ near syncope / low sodium

## 2017-06-07 NOTE — ED Notes (Signed)
Pt reports that she is being seen for decreased Na+ - pt c/o dizziness for the last week and went to her PCP today and they sent her to the ER - pt fell Saturday d/t the dizziness - pt also had episode last week when she was driving that "everythign went black and then that just passed"

## 2017-06-08 DIAGNOSIS — L8990 Pressure ulcer of unspecified site, unspecified stage: Secondary | ICD-10-CM | POA: Insufficient documentation

## 2017-06-08 DIAGNOSIS — E871 Hypo-osmolality and hyponatremia: Secondary | ICD-10-CM | POA: Diagnosis not present

## 2017-06-08 LAB — CBC
HCT: 25.4 % — ABNORMAL LOW (ref 35.0–47.0)
HEMOGLOBIN: 8.6 g/dL — AB (ref 12.0–16.0)
MCH: 29.9 pg (ref 26.0–34.0)
MCHC: 34 g/dL (ref 32.0–36.0)
MCV: 87.9 fL (ref 80.0–100.0)
Platelets: 267 10*3/uL (ref 150–440)
RBC: 2.89 MIL/uL — AB (ref 3.80–5.20)
RDW: 14.5 % (ref 11.5–14.5)
WBC: 4 10*3/uL (ref 3.6–11.0)

## 2017-06-08 LAB — BASIC METABOLIC PANEL
ANION GAP: 6 (ref 5–15)
BUN: 16 mg/dL (ref 6–20)
CALCIUM: 8.3 mg/dL — AB (ref 8.9–10.3)
CHLORIDE: 98 mmol/L — AB (ref 101–111)
CO2: 24 mmol/L (ref 22–32)
Creatinine, Ser: 1.13 mg/dL — ABNORMAL HIGH (ref 0.44–1.00)
GFR calc non Af Amer: 43 mL/min — ABNORMAL LOW (ref >60)
GFR, EST AFRICAN AMERICAN: 50 mL/min — AB (ref >60)
Glucose, Bld: 96 mg/dL (ref 65–99)
Potassium: 4.6 mmol/L (ref 3.5–5.1)
Sodium: 128 mmol/L — ABNORMAL LOW (ref 135–145)

## 2017-06-08 LAB — GLUCOSE, CAPILLARY
GLUCOSE-CAPILLARY: 84 mg/dL (ref 65–99)
Glucose-Capillary: 111 mg/dL — ABNORMAL HIGH (ref 65–99)
Glucose-Capillary: 151 mg/dL — ABNORMAL HIGH (ref 65–99)
Glucose-Capillary: 133 mg/dL — ABNORMAL HIGH (ref 65–99)

## 2017-06-08 MED ORDER — IPRATROPIUM-ALBUTEROL 0.5-2.5 (3) MG/3ML IN SOLN: 3.0000 mL | RESPIRATORY_TRACT | Status: DC | PRN

## 2017-06-08 MED ORDER — ENOXAPARIN SODIUM 40 MG/0.4ML ~~LOC~~ SOLN
40.0000 mg | SUBCUTANEOUS | Status: DC
  Administered 2017-06-08: 40 mg via SUBCUTANEOUS
  Filled 2017-06-08: qty 0.4

## 2017-06-08 NOTE — Progress Notes (Signed)
[place] [facility] [place] NAME: Eline Geng    MR#:  [id number]  DATE OF BIRTH:  [birth year]  SUBJECTIVE:  CHIEF COMPLAINT:   Chief Complaint  Patient presents with  . Dizziness  . Weakness  Feels weak REVIEW OF SYSTEMS:  Review of Systems  Constitutional: Positive for malaise/fatigue. Negative for chills, fever and weight loss.  HENT: Negative for nosebleeds and sore throat.   Eyes: Negative for blurred vision.  Respiratory: Negative for cough, shortness of breath and wheezing.   Cardiovascular: Negative for chest pain, orthopnea, leg swelling and PND.  Gastrointestinal: Negative for abdominal pain, constipation, diarrhea, heartburn, nausea and vomiting.  Genitourinary: Negative for dysuria and urgency.  Musculoskeletal: Negative for back pain.  Skin: Negative for rash.  Neurological: Positive for weakness. Negative for dizziness, speech change, focal weakness and headaches.  Endo/Heme/Allergies: Does not bruise/bleed easily.  Psychiatric/Behavioral: Negative for depression.    DRUG ALLERGIES:  No Known Allergies VITALS:  Blood pressure (!) 149/57, pulse 74, temperature 98.5 F (36.9 C), temperature source Oral, resp. rate 16, height 5\' 1"  (1.549 m), weight 86.2 kg (190 lb 0.6 oz), SpO2 97 %. PHYSICAL EXAMINATION:  Physical Exam  Constitutional: She is oriented to person, place, and time and well-developed, well-nourished, and in no distress.  HENT:  Head: Normocephalic and atraumatic.  Eyes: Pupils are equal, round, and reactive to light. Conjunctivae and EOM are normal.  Neck: Normal range of motion. Neck supple. No tracheal deviation present. No thyromegaly present.  Cardiovascular: Normal rate, regular rhythm and normal heart sounds.   Pulmonary/Chest: Effort normal and breath sounds normal. No respiratory distress. She has no wheezes. She exhibits no tenderness.  Abdominal: Soft. Bowel sounds are normal. She exhibits no  distension. There is no tenderness.  Musculoskeletal: Normal range of motion.  Neurological: She is alert and oriented to person, place, and time. No cranial nerve deficit.  Skin: Skin is warm and dry. No rash noted.  Psychiatric: Mood and affect normal.   LABORATORY PANEL:  Female CBC  Recent Labs Lab 06/08/17 0426  WBC 4.0  HGB 8.6*  HCT 25.4*  PLT 267   ------------------------------------------------------------------------------------------------------------------ Chemistries   Recent Labs Lab 06/08/17 0426  NA 128*  K 4.6  CL 98*  CO2 24  GLUCOSE 96  BUN 16  CREATININE 1.13*  CALCIUM 8.3*   RADIOLOGY:  No results found. ASSESSMENT AND PLAN:   1.  Hyponatremia.  Likely with over diuresis. continue Gentle IV fluid hydration.  Sodium improved from 123->128 2.  Essential hypertension.  Continue hydralazine.  Hold benazepril and lisinopril unsure which one she is taking. 3.  Hyperlipidemia unspecified on Pravachol 4.  Type 2 diabetes mellitus on Levemir insulin and NovoLog sliding scale 5.  History of CVA on aspirin and Plavix 6.  Wheeze: Continue nebulizer treatments.  chest x-ray - no acute pathology    All the records are reviewed and case discussed with Care Management/Social Worker. Management plans discussed with the patient, nursing and they are in agreement.  CODE STATUS: Full Code  TOTAL TIME TAKING CARE OF THIS PATIENT: 35 minutes.   More than 50% of the time was spent in counseling/coordination of care: YES  POSSIBLE D/C IN 1-2 DAYS, DEPENDING ON CLINICAL CONDITION.   Max Sane M.D on 06/08/2017 at 4:40 PM  Between 7am to 6pm - Pager - [phone]  After 6pm go to [email or web address] - [facility]  Office  [phone]  CC: Primary care physician; Denton Lank, MD  Note: This dictation was prepared with Dragon dictation along with smaller phrase technology. Any transcriptional errors that result  from this process are unintentional.

## 2017-06-08 NOTE — Evaluation (Signed)
Physical Therapy Evaluation Patient Details Name: Kristen Barron MRN: [id number] DOB: 12/31/1928 Today's Date: 06/08/2017   History of Present Illness  Pt is an 83 y.o. female prseenting to hospital with dizziness, fatigue, nausea, and weakness.  Recent diagnosis of UTI and now found to have low sodium.  Pt admitted with hyponatremia.  PMH of R breast CA with mastectomy and chemo, CHF, DM, DVT, htn, stroke, L3-4 laminectomy.  Clinical Impression  Prior to hospital admission, pt was modified independent (holds onto furniture in home when walking and uses RW in community for balance).  Pt lives with her granddaughter and great granddaughter who assist pt as needed (lives in 1 level home with ramp to enter).  Currently pt is modified independent with supine to sit; SBA with transfers using RW; and SBA ambulating with RW in hallway (pt initially mildly unsteady ambulating so PT provided CGA for safety but improved mobility/balance noted with distance).  Pt's HR 86 bpm at rest and increased up to 118 bpm with ambulation (decreased back to 80's bpm with sitting rest break).  Pt would benefit from skilled PT to address noted impairments and functional limitations (see below for any additional details).  Upon hospital discharge, recommend pt discharge to home with support of family.    Follow Up Recommendations No PT follow up    Equipment Recommendations  Rolling walker with 5" wheels (pt already has RW)    Recommendations for Other Services       Precautions / Restrictions Precautions Precautions: Fall Precaution Comments: L chest port Restrictions Weight Bearing Restrictions: No      Mobility  Bed Mobility Overal bed mobility: Modified Independent             General bed mobility comments: Supine to sit with HOB elevated without any difficulties.  Transfers Overall transfer level: Needs assistance Equipment used: Rolling walker (2 wheeled);None Transfers: Sit to/from [employer] to Stand: Min guard;Supervision Stand pivot transfers: Min guard       General transfer comment: stand step turn bed to recliner no AD CGA (pt holding onto furniture occasionally during transfer); sit to/from stand no AD CGA and with RW SBA; steady with transfers (no loss of balance noted)  Ambulation/Gait Ambulation/Gait assistance: Min guard;Supervision Ambulation Distance (Feet): 200 Feet Assistive device: Rolling walker (2 wheeled) Gait Pattern/deviations: Step-through pattern     General Gait Details: mild decreased cadence; initially mildly unsteady (PT providing CGA for safety but no overt loss of balance noted requiring assist to steady) but improved balance noted with distance (progressed to SBA for safety)  Stairs Stairs:  (Deferred (pt uses ramp at home))          Wheelchair Mobility    Modified Rankin (Stroke Patients Only)       Balance Overall balance assessment: History of Falls;Needs assistance Sitting-balance support: No upper extremity supported;Feet supported Sitting balance-Leahy Scale: Normal Sitting balance - Comments: sitting reaching outside BOS   Standing balance support: No upper extremity supported Standing balance-Leahy Scale: Good Standing balance comment: standing reaching within BOS steady                             Pertinent Vitals/Pain Pain Assessment: No/denies pain  O2 WFL on room air during session.    Home Living Family/patient expects to be discharged to:: Private residence Living Arrangements: Other relatives (Granddaughter and great granddaughter) Available Help at Discharge: Family Type of Home: Mobile home  Home Access: Ramped entrance     Home Layout: One level Home Equipment: Walker - 2 wheels Additional Comments: Has commode next to bed.    Prior Function Level of Independence: Needs assistance   Gait / Transfers Assistance Needed: Holds onto furniture in home and uses RW in  community.  2 falls in past 6 months (lost balance backwards and to side) but no injuries per pt report.  ADL's / Homemaking Assistance Needed: Assist with showers        Hand Dominance        Extremity/Trunk Assessment   Upper Extremity Assessment Upper Extremity Assessment: Generalized weakness    Lower Extremity Assessment Lower Extremity Assessment: Generalized weakness    Cervical / Trunk Assessment Cervical / Trunk Assessment: Normal  Communication   Communication: HOH  Cognition Arousal/Alertness: Awake/alert Behavior During Therapy: WFL for tasks assessed/performed Overall Cognitive Status: Within Functional Limits for tasks assessed                                        General Comments General comments (skin integrity, edema, etc.): Pt resting in bed upon PT entry.  Nursing cleared pt for participation in physical therapy.  Pt agreeable to PT session.    Exercises  Ambulation   Assessment/Plan    PT Assessment Patient needs continued PT services  PT Problem List Decreased strength;Decreased mobility;Decreased balance       PT Treatment Interventions DME instruction;Gait training;Functional mobility training;Therapeutic activities;Therapeutic exercise;Balance training;Patient/family education    PT Goals (Current goals can be found in the Care Plan section)  Acute Rehab PT Goals Patient Stated Goal: to go home PT Goal Formulation: With patient Time For Goal Achievement: 06/22/17 Potential to Achieve Goals: Good    Frequency Min 2X/week   Barriers to discharge        Co-evaluation               AM-PAC PT "6 Clicks" Daily Activity  Outcome Measure Difficulty turning over in bed (including adjusting bedclothes, sheets and blankets)?: None Difficulty moving from lying on back to sitting on the side of the bed? : None Difficulty sitting down on and standing up from a chair with arms (e.g., wheelchair, bedside commode,  etc,.)?: A Little Help needed moving to and from a bed to chair (including a wheelchair)?: A Little Help needed walking in hospital room?: A Little Help needed climbing 3-5 steps with a railing? : A Little 6 Click Score: 20    End of Session Equipment Utilized During Treatment: Gait belt Activity Tolerance: Patient tolerated treatment well Patient left: in chair;with call bell/phone within reach;with chair alarm set Nurse Communication: Mobility status;Precautions (via white board) PT Visit Diagnosis: Unsteadiness on feet (R26.81);Muscle weakness (generalized) (M62.81);History of falling (Z91.81)    Time: 6789-3810 PT Time Calculation (min) (ACUTE ONLY): 24 min   Charges:   PT Evaluation $PT Eval Low Complexity: 1 Low PT Treatments $Therapeutic Exercise: 8-22 mins   PT G Codes:   PT G-Codes **NOT FOR INPATIENT CLASS** Functional Assessment Tool Used: AM-PAC 6 Clicks Basic Mobility Functional Limitation: Mobility: Walking and moving around Mobility: Walking and Moving Around Current Status (F7510): At least 20 percent but less than 40 percent impaired, limited or restricted Mobility: Walking and Moving Around Goal Status [phone]): 0 percent impaired, limited or restricted    Leitha Bleak, PT 06/08/17, 10:37 AM [phone]

## 2017-06-09 DIAGNOSIS — E871 Hypo-osmolality and hyponatremia: Secondary | ICD-10-CM | POA: Diagnosis not present

## 2017-06-09 LAB — CBC
HCT: 27.9 % — ABNORMAL LOW (ref 35.0–47.0)
Hemoglobin: 9.5 g/dL — ABNORMAL LOW (ref 12.0–16.0)
MCH: 30.3 pg (ref 26.0–34.0)
MCHC: 34.1 g/dL (ref 32.0–36.0)
MCV: 88.8 fL (ref 80.0–100.0)
PLATELETS: 271 10*3/uL (ref 150–440)
RBC: 3.14 MIL/uL — ABNORMAL LOW (ref 3.80–5.20)
RDW: 14.8 % — AB (ref 11.5–14.5)
WBC: 5 10*3/uL (ref 3.6–11.0)

## 2017-06-09 LAB — GLUCOSE, CAPILLARY
Glucose-Capillary: 76 mg/dL (ref 65–99)
Glucose-Capillary: 160 mg/dL — ABNORMAL HIGH (ref 65–99)

## 2017-06-09 LAB — BASIC METABOLIC PANEL
ANION GAP: 4 — AB (ref 5–15)
BUN: 16 mg/dL (ref 6–20)
CALCIUM: 8.7 mg/dL — AB (ref 8.9–10.3)
CO2: 24 mmol/L (ref 22–32)
CREATININE: 1.15 mg/dL — AB (ref 0.44–1.00)
Chloride: 102 mmol/L (ref 101–111)
GFR calc Af Amer: 49 mL/min — ABNORMAL LOW (ref >60)
GFR, EST NON AFRICAN AMERICAN: 42 mL/min — AB (ref >60)
GLUCOSE: 78 mg/dL (ref 65–99)
Potassium: 4.5 mmol/L (ref 3.5–5.1)
Sodium: 130 mmol/L — ABNORMAL LOW (ref 135–145)

## 2017-06-09 MED ORDER — TORSEMIDE 20 MG PO TABS: 20.0000 mg | ORAL_TABLET | Freq: Every day | ORAL | 0 refills | Status: DC

## 2017-06-09 NOTE — Progress Notes (Signed)
Discharge summary reviewed with verbal understanding. Answered all questions.  

## 2017-06-09 NOTE — Discharge Instructions (Signed)
Hyponatremia Hyponatremia is when the amount of salt (sodium) in your blood is too low. When salt levels are low, your cells absorb extra water and they swell. The swelling happens throughout the body, but it mostly affects the brain. Follow these instructions at home:  Take medicines only as told by your doctor. Many medicines can make this condition worse. Talk with your doctor about any medicines that you are currently taking.  Carefully follow a recommended diet as told by your doctor.  Carefully follow instructions from your doctor about fluid restrictions.  Keep all follow-up visits as told by your doctor. This is important.  Do not drink alcohol. Contact a doctor if:  You feel sicker to your stomach (nauseous).  You feel more confused.  You feel more tired (fatigued).  Your headache gets worse.  You feel weaker.  Your symptoms go away and then they come back.  You have trouble following the diet instructions. Get help right away if:  You start to twitch and shake (have a seizure).  You pass out (faint).  You keep having watery poop (diarrhea).  You keep throwing up (vomiting). This information is not intended to replace advice given to you by your health care provider. Make sure you discuss any questions you have with your health care provider. Document Released: 04/08/2011 Document Revised: 01/02/2016 Document Reviewed: 07/23/2014 Elsevier Interactive Patient Education  2018 Elsevier Inc.  

## 2017-06-11 NOTE — Discharge Summary (Signed)
[place] [facility] [place] NAME: Kristen Barron    MR#:  [id number]  DATE OF BIRTH:  [birth year]  DATE OF ADMISSION:  06/07/2017   ADMITTING PHYSICIAN: Loletha Grayer, MD  DATE OF DISCHARGE: 06/09/2017  1:54 PM  PRIMARY CARE PHYSICIAN: Denton Lank, MD   ADMISSION DIAGNOSIS:  Hyponatremia [E87.1] Wheeze [R06.2] Acute renal failure, unspecified acute renal failure type ([place]) [N17.9] DISCHARGE DIAGNOSIS:  Active Problems:   Hyponatremia   Pressure injury of skin  SECONDARY DIAGNOSIS:   Past Medical History:  Diagnosis Date  . Arthritis   . Breast cancer ([place]) 03/06/2014   right breast with mastectomy and chemo  . Cancer [facility])    Reports lumpectomy for a cyst  . CHF (congestive heart failure) ([place])   . Collagen vascular disease ([place])   . Diabetes ([place])   . DVT (deep venous thrombosis) ([place])   . Hemorrhoids   . Hypertension   . Renal disorder   . Renal insufficiency   . Stroke [facility])    August 2014, but has had strokes prior as well   HOSPITAL COURSE:  83 y.o. female with a known history of feeling dizzy and told to come in because her sodium was low.  Lately she has been having a poor appetite.  She is not been feeling well.  As outpatient, she had a sodium level that was low.  The patient was recently started on a water pill.  In the ER, her sodium was 123 and hospitalist services were contacted for further evaluation.  1. Hyponatremia. Likely with over diuresis. Sodium improved from 123->130 with hydration and holding diuretics. 2. Essential hypertension: monitor as an outpt and adjust meds as need 3. Hyperlipidemia: continue Pravachol 4. Type 2 diabetes mellitus: continue home meds 5. History of CVA on aspirin and Plavix 6. Wheeze: improving. chest x-ray - no acute pathology  DISCHARGE CONDITIONS:  stable CONSULTS OBTAINED:   DRUG ALLERGIES:  No Known Allergies DISCHARGE MEDICATIONS:   Allergies as of  06/09/2017   No Known Allergies     Medication List    STOP taking these medications   HYDROcodone-acetaminophen 5-325 MG tablet Commonly known as:  NORCO/VICODIN     TAKE these medications   acetaminophen 500 MG tablet Commonly known as:  TYLENOL Take 500 mg by mouth every 6 (six) hours as needed.   benazepril 20 MG tablet Commonly known as:  LOTENSIN Take 20 mg by mouth daily.   clopidogrel 75 MG tablet Commonly known as:  PLAVIX Take 75 mg by mouth daily with breakfast.   gabapentin 600 MG tablet Commonly known as:  NEURONTIN   GLUCOSAMINE 1500 COMPLEX PO Take by mouth 3 (three) times daily.   hydrALAZINE 50 MG tablet Commonly known as:  APRESOLINE Take 50 mg by mouth 3 (three) times daily.   insulin aspart 100 UNIT/ML injection Commonly known as:  novoLOG Inject 8 Units into the skin 3 (three) times daily before meals.   insulin detemir 100 UNIT/ML injection Commonly known as:  LEVEMIR Inject 18 Units into the skin at bedtime.   Melatonin 5 MG Tabs Take 5 mg by mouth at bedtime.   metoprolol succinate 100 MG 24 hr tablet Commonly known as:  TOPROL-XL Take 100 mg by mouth daily. Take with or immediately following a meal.   Omega 3 1200 MG Caps Take by mouth daily.   ondansetron 4 MG tablet Commonly known as:  ZOFRAN Take 4 mg by mouth  every 8 (eight) hours as needed for nausea or vomiting.   pravastatin 80 MG tablet Commonly known as:  PRAVACHOL Take 80 mg by mouth every evening.   torsemide 20 MG tablet Commonly known as:  DEMADEX Take 1 tablet (20 mg total) by mouth daily. Start tomorrow 11/1 What changed:  when to take this  additional instructions        DISCHARGE INSTRUCTIONS:   DIET:  Regular diet DISCHARGE CONDITION:  Good ACTIVITY:  Activity as tolerated OXYGEN:  Home Oxygen: No.  Oxygen Delivery: room air DISCHARGE LOCATION:  home   If you experience worsening of your admission symptoms, develop shortness of breath,  life threatening emergency, suicidal or homicidal thoughts you must seek medical attention immediately by calling 911 or calling your MD immediately  if symptoms less severe.  You Must read complete instructions/literature along with all the possible adverse reactions/side effects for all the Medicines you take and that have been prescribed to you. Take any new Medicines after you have completely understood and accpet all the possible adverse reactions/side effects.   Please note  You were cared for by a hospitalist during your hospital stay. If you have any questions about your discharge medications or the care you received while you were in the hospital after you are discharged, you can call the unit and asked to speak with the hospitalist on call if the hospitalist that took care of you is not available. Once you are discharged, your primary care physician will handle any further medical issues. Please note that NO REFILLS for any discharge medications will be authorized once you are discharged, as it is imperative that you return to your primary care physician (or establish a relationship with a primary care physician if you do not have one) for your aftercare needs so that they can reassess your need for medications and monitor your lab values.    On the day of Discharge:  VITAL SIGNS:  Blood pressure (!) 171/54, pulse 63, temperature 98.6 F (37 C), temperature source Oral, resp. rate 18, height 5\' 1"  (1.549 m), weight 86.2 kg (190 lb 0.6 oz), SpO2 97 %. PHYSICAL EXAMINATION:  GENERAL:  83 y.o.-year-old patient lying in the bed with no acute distress.  EYES: Pupils equal, round, reactive to light and accommodation. No scleral icterus. Extraocular muscles intact.  HEENT: Head atraumatic, normocephalic. Oropharynx and nasopharynx clear.  NECK:  Supple, no jugular venous distention. No thyroid enlargement, no tenderness.  LUNGS: Normal breath sounds bilaterally, no wheezing, rales,rhonchi or  crepitation. No use of accessory muscles of respiration.  CARDIOVASCULAR: S1, S2 normal. No murmurs, rubs, or gallops.  ABDOMEN: Soft, non-tender, non-distended. Bowel sounds present. No organomegaly or mass.  EXTREMITIES: No pedal edema, cyanosis, or clubbing.  NEUROLOGIC: Cranial nerves II through XII are intact. Muscle strength 5/5 in all extremities. Sensation intact. Gait not checked.  PSYCHIATRIC: The patient is alert and oriented x 3.  SKIN: No obvious rash, lesion, or ulcer.  DATA REVIEW:   CBC  Recent Labs Lab 06/09/17 0700  WBC 5.0  HGB 9.5*  HCT 27.9*  PLT 271    Chemistries   Recent Labs Lab 06/09/17 0700  NA 130*  K 4.5  CL 102  CO2 24  GLUCOSE 78  BUN 16  CREATININE 1.15*  CALCIUM 8.7*     Follow-up Information    Denton Lank, MD. Schedule an appointment as soon as possible for a visit in 1 week(s).   Specialty:  Family Medicine Contact  information: [address] [place] [address] [phone]            Management plans discussed with the patient, nursing and they are in agreement.  CODE STATUS: Prior   TOTAL TIME TAKING CARE OF THIS PATIENT: 45 minutes.    Max Sane M.D on 06/11/2017 at 1:02 PM  Between 7am to 6pm - Pager - [phone]  After 6pm go to [email or web address] - Proofreader  [facility]  Office  [phone]  CC: Primary care physician; Denton Lank, MD   Note: This dictation was prepared with Dragon dictation along with smaller phrase technology. Any transcriptional errors that result from this process are unintentional.

## 2017-08-08 ENCOUNTER — Encounter: Payer: Self-pay | Admitting: Emergency Medicine

## 2017-08-08 ENCOUNTER — Emergency Department
Admission: EM | Admit: 2017-08-08 | Discharge: 2017-08-08 | Disposition: A | Discharge status: Home / Self Care | Payer: Medicare HMO | Attending: Emergency Medicine | Admitting: Emergency Medicine

## 2017-08-08 ENCOUNTER — Other Ambulatory Visit: Payer: Self-pay

## 2017-08-08 DIAGNOSIS — Z8673 Personal history of transient ischemic attack (TIA), and cerebral infarction without residual deficits: Secondary | ICD-10-CM | POA: Diagnosis not present

## 2017-08-08 DIAGNOSIS — R531 Weakness: Secondary | ICD-10-CM | POA: Diagnosis not present

## 2017-08-08 DIAGNOSIS — E1122 Type 2 diabetes mellitus with diabetic chronic kidney disease: Secondary | ICD-10-CM | POA: Insufficient documentation

## 2017-08-08 DIAGNOSIS — I129 Hypertensive chronic kidney disease with stage 1 through stage 4 chronic kidney disease, or unspecified chronic kidney disease: Secondary | ICD-10-CM | POA: Diagnosis not present

## 2017-08-08 DIAGNOSIS — Z79899 Other long term (current) drug therapy: Secondary | ICD-10-CM | POA: Insufficient documentation

## 2017-08-08 DIAGNOSIS — R5383 Other fatigue: Secondary | ICD-10-CM | POA: Diagnosis present

## 2017-08-08 DIAGNOSIS — Z7902 Long term (current) use of antithrombotics/antiplatelets: Secondary | ICD-10-CM | POA: Insufficient documentation

## 2017-08-08 DIAGNOSIS — Z853 Personal history of malignant neoplasm of breast: Secondary | ICD-10-CM | POA: Insufficient documentation

## 2017-08-08 DIAGNOSIS — N183 Chronic kidney disease, stage 3 (moderate): Secondary | ICD-10-CM | POA: Insufficient documentation

## 2017-08-08 DIAGNOSIS — Z794 Long term (current) use of insulin: Secondary | ICD-10-CM | POA: Diagnosis not present

## 2017-08-08 LAB — URINALYSIS, COMPLETE (UACMP) WITH MICROSCOPIC
BILIRUBIN URINE: NEGATIVE
Glucose, UA: NEGATIVE mg/dL
Hgb urine dipstick: NEGATIVE
KETONES UR: 5 mg/dL — AB
LEUKOCYTES UA: NEGATIVE
NITRITE: NEGATIVE
PH: 6 (ref 5.0–8.0)
Protein, ur: 30 mg/dL — AB
Specific Gravity, Urine: 1.012 (ref 1.005–1.030)
Squamous Epithelial / LPF: NONE SEEN

## 2017-08-08 LAB — BASIC METABOLIC PANEL
ANION GAP: 10 (ref 5–15)
BUN: 23 mg/dL — ABNORMAL HIGH (ref 6–20)
CHLORIDE: 98 mmol/L — AB (ref 101–111)
CO2: 22 mmol/L (ref 22–32)
Calcium: 8.9 mg/dL (ref 8.9–10.3)
Creatinine, Ser: 1.43 mg/dL — ABNORMAL HIGH (ref 0.44–1.00)
GFR calc non Af Amer: 32 mL/min — ABNORMAL LOW (ref >60)
GFR, EST AFRICAN AMERICAN: 37 mL/min — AB (ref >60)
Glucose, Bld: 77 mg/dL (ref 65–99)
POTASSIUM: 4.4 mmol/L (ref 3.5–5.1)
Sodium: 130 mmol/L — ABNORMAL LOW (ref 135–145)

## 2017-08-08 LAB — CBC
HCT: 29.1 % — ABNORMAL LOW (ref 35.0–47.0)
Hemoglobin: 9.8 g/dL — ABNORMAL LOW (ref 12.0–16.0)
MCH: 30.1 pg (ref 26.0–34.0)
MCHC: 33.8 g/dL (ref 32.0–36.0)
MCV: 89 fL (ref 80.0–100.0)
Platelets: 232 10*3/uL (ref 150–440)
RBC: 3.27 MIL/uL — ABNORMAL LOW (ref 3.80–5.20)
RDW: 14.9 % — AB (ref 11.5–14.5)
WBC: 5.4 10*3/uL (ref 3.6–11.0)

## 2017-08-08 LAB — GLUCOSE, CAPILLARY
GLUCOSE-CAPILLARY: 104 mg/dL — AB (ref 65–99)
Glucose-Capillary: 65 mg/dL (ref 65–99)

## 2017-08-08 LAB — HEPATIC FUNCTION PANEL
ALK PHOS: 63 U/L (ref 38–126)
ALT: 10 U/L — ABNORMAL LOW (ref 14–54)
AST: 22 U/L (ref 15–41)
Albumin: 3.7 g/dL (ref 3.5–5.0)
BILIRUBIN INDIRECT: 0.8 mg/dL (ref 0.3–0.9)
BILIRUBIN TOTAL: 0.9 mg/dL (ref 0.3–1.2)
Bilirubin, Direct: 0.1 mg/dL (ref 0.1–0.5)
TOTAL PROTEIN: 7.1 g/dL (ref 6.5–8.1)

## 2017-08-08 LAB — TROPONIN I

## 2017-08-08 MED ORDER — SODIUM CHLORIDE 0.9 % IV BOLUS (SEPSIS)
1000.0000 mL | Freq: Once | INTRAVENOUS | Status: AC
  Administered 2017-08-08: 1000 mL via INTRAVENOUS

## 2017-08-08 MED ORDER — ONDANSETRON HCL 4 MG/2ML IJ SOLN
4.0000 mg | Freq: Once | INTRAMUSCULAR | Status: AC
  Administered 2017-08-08: 4 mg via INTRAVENOUS
  Filled 2017-08-08: qty 2

## 2017-08-08 NOTE — ED Notes (Signed)
ED Provider at bedside. 

## 2017-08-08 NOTE — ED Triage Notes (Signed)
Patient complaining "I just don't feel good". Friend of patient reports patient has had N/V/D on Wednesday. Patient denies any specific pain at this time. Patient reports she has had decreased appetite and increased lethargy for several days. Denies fever at home.

## 2017-08-08 NOTE — ED Notes (Signed)
This Rn Received call from lab that pt lav top need a redraw. This RN notified Psychologist, occupational of the need.

## 2017-08-08 NOTE — ED Provider Notes (Signed)
-----------------------------------------   4:54 PM on 08/08/2017 -----------------------------------------  Signed out to me at 4 pm. If ua is neg pt is to be discharged according to her Paduchowski's plan. Pt and family in agreement. Wu unremarkable. F/u with pcp. Return precautions and f/u given and understood.    Schuyler Amor, MD 08/08/17 [phone]

## 2017-08-08 NOTE — ED Provider Notes (Signed)
[facility] Emergency Department Provider Note  Time seen: 2:07 PM  I have reviewed the triage vital signs and the nursing notes.   HISTORY  Chief Complaint Fatigue and Abdominal Pain    HPI Kristen Barron is a 83 y.o. female with a past medical history of CHF, diabetes, DVT, hypertension, CVA, who presents to the emergency department for generalized fatigue weakness nausea and diarrhea.  According the family for the past 1 month the patient has been experiencing intermittent episodes of weakness and diarrhea as well as nausea.  Patient saw her primary care doctor over the past 1 month, the patient states she told her something was low in her blood but she does not remember what.  Over the past 1 week the patient's weakness has progressed the patient is eating and drinking very little per family, patient states she feels nauseated at times.  Patient admits to diarrhea but states she will go 2 or 3 days without having a bowel movement and then she will have several episodes of diarrhea and then she will go another 2 or 3 days without having bowel movements.  States nausea but denies any vomiting.  Review of systems is largely negative.  No fever, cough, congestion, chest or abdominal pain, dysuria.  Patient denies any pain at this time states "I just do not feel well."  Past Medical History:  Diagnosis Date  . Arthritis   . Breast cancer ([place]) 03/06/2014   right breast with mastectomy and chemo  . Cancer [facility])    Reports lumpectomy for a cyst  . CHF (congestive heart failure) ([place])   . Collagen vascular disease ([place])   . Diabetes ([place])   . DVT (deep venous thrombosis) ([place])   . Hemorrhoids   . Hypertension   . Renal disorder   . Renal insufficiency   . Stroke [facility])    August 2014, but has had strokes prior as well    Patient Active Problem List   Diagnosis Date Noted  . Pressure injury of skin 06/08/2017  . Abdominal pain 04/09/2016  . Right lower quadrant  pain   . Vomiting   . Gallstone   . Osteoarthritis of knee (Bilateral) (L>R) 12/26/2015  . Chronic pain 12/11/2015  . Long term current use of opiate analgesic 12/11/2015  . Encounter for therapeutic drug level monitoring 12/11/2015  . Chronic knee pain (Location of Primary Source of Pain) (Bilateral) (L>R) 12/11/2015  . Long term prescription opiate use 12/11/2015  . Opiate use (20 MME/Day) 12/11/2015  . Chronic hand pain (Location of Secondary source of pain) (Bilateral) (L>R) 12/11/2015  . Failed back surgical syndrome x 2 (Surgeon: Dr. Deri Fuelling) 12/11/2015  . Lumbar spondylosis 12/11/2015  . Chronic low back pain (Location of Tertiary source of pain) (Bilateral) (L>R) 12/11/2015  . Lumbar facet syndrome 12/11/2015  . Chronic lower extremity pain (Bilateral) (L>R) 12/11/2015  . Chronic neck pain (Left) 12/11/2015  . Cervical spondylosis 12/11/2015  . Chronic shoulder pain (Right) 12/11/2015  . Insulin dependent diabetes mellitus ([place]) 12/11/2015  . Abnormal MRI, lumbar spine (07/19/2013) 12/11/2015  . Lumbar postlaminectomy syndrome (L4-5) 12/11/2015  . Fusion of lumbar spine (L4-5 Ray cages) 12/11/2015  . Lumbar Levoscoliosis with apex at L4 12/11/2015  . Grade 1 Retrolisthesis of L2 over L3 and L3 over L4 12/11/2015  . Lumbar facet arthropathy 12/11/2015  . Lumbar foraminal stenosis (Bilateral L2-3, L3-4 & Left L5-S1) 12/11/2015  . Chronic anticoagulation (Plavix) 12/11/2015  . Breast cancer of  lower-outer quadrant of right female breast ([place]) 02/28/2014  . Lumbar stenosis with neurogenic claudication 07/24/2013  . Lumbar central spinal stenosis ( Severe at L3-4) (Mild R>L L2-3 & L5-S1) 07/20/2013  . Hyponatremia 07/20/2013  . Hypertension 07/20/2013  . Normocytic anemia 07/20/2013  . CKD (chronic kidney disease) stage 3, GFR 30-59 ml/min (HCC) 07/20/2013  .  Lumbar DDD (degenerative disc disease) 07/20/2013    Past Surgical History:  Procedure Laterality Date  .  ABDOMINAL HYSTERECTOMY     Patient not clear as to why  . BACK SURGERY    . BREAST BIOPSY Right February 15 2014   invasive mammary carcinoma/ER/PR negative, HER 2 positive  . BREAST BIOPSY Right 1999   negative biopsy  . BREAST SURGERY Right 03/06/14   mastectomy  . COLONOSCOPY WITH PROPOFOL N/A 04/15/2016   Procedure: COLONOSCOPY WITH PROPOFOL;  Surgeon: Manya Silvas, MD;  Location: [facility] ENDOSCOPY;  Service: Endoscopy;  Laterality: N/A;  . ESOPHAGOGASTRODUODENOSCOPY (EGD) WITH PROPOFOL N/A 04/15/2016   Procedure: ESOPHAGOGASTRODUODENOSCOPY (EGD) WITH PROPOFOL;  Surgeon: Manya Silvas, MD;  Location: [facility] ENDOSCOPY;  Service: Endoscopy;  Laterality: N/A;  . HAND SURGERY Right    Carpel tunnel release in the 1970s  . LUMBAR LAMINECTOMY/DECOMPRESSION MICRODISCECTOMY Bilateral 07/24/2013   Procedure: LUMBAR LAMINECTOMY/DECOMPRESSION MICRODISCECTOMY LUMBAR THREE-FOUR;  Surgeon: Charlie Pitter, MD;  Location: [place] NEURO ORS;  Service: Neurosurgery;  Laterality: Bilateral;  . MASTECTOMY Right 03/06/2014   right br ca    Prior to Admission medications   Medication Sig Start Date End Date Taking? Authorizing Provider  acetaminophen (TYLENOL) 500 MG tablet Take 500 mg by mouth every 6 (six) hours as needed.    [provider]  benazepril (LOTENSIN) 20 MG tablet Take 20 mg by mouth daily. 10/15/16   [provider]  clopidogrel (PLAVIX) 75 MG tablet Take 75 mg by mouth daily with breakfast.    [provider]  gabapentin (NEURONTIN) 600 MG tablet  11/25/16   [provider]  Glucosamine-Chondroit-Vit C-Mn (GLUCOSAMINE 1500 COMPLEX PO) Take by mouth 3 (three) times daily.    [provider]  hydrALAZINE (APRESOLINE) 50 MG tablet Take 50 mg by mouth 3 (three) times daily.     [provider]  insulin aspart (NOVOLOG) 100 UNIT/ML injection Inject 8 Units into the skin 3 (three) times daily before meals.    [provider]  insulin detemir  (LEVEMIR) 100 UNIT/ML injection Inject 18 Units into the skin at bedtime.    [provider]  Melatonin 5 MG TABS Take 5 mg by mouth at bedtime.    [provider]  metoprolol succinate (TOPROL-XL) 100 MG 24 hr tablet Take 100 mg by mouth daily. Take with or immediately following a meal.    [provider]  Omega 3 1200 MG CAPS Take by mouth daily.     [provider]  ondansetron (ZOFRAN) 4 MG tablet Take 4 mg by mouth every 8 (eight) hours as needed for nausea or vomiting.    [provider]  pravastatin (PRAVACHOL) 80 MG tablet Take 80 mg by mouth every evening.     [provider]  torsemide (DEMADEX) 20 MG tablet Take 1 tablet (20 mg total) by mouth daily. Start tomorrow 11/1 06/10/17   Max Sane, MD    No Known Allergies  Family History  Problem Relation Age of Onset  . CAD Father   . Cancer Sister 46       breast  . Breast  cancer Sister 32  . Cancer Brother        kidney  . Cancer Maternal Aunt        breast  . Breast cancer Maternal Aunt 70  . Cancer Other        maternal niece with breast cancer  . Breast cancer Other   . Cancer Sister        pancreatic    Social History Social History   Tobacco Use  . Smoking status: Never Smoker  . Smokeless tobacco: Never Used  Substance Use Topics  . Alcohol use: No  . Drug use: No    Review of Systems Constitutional: Negative for fever. Eyes: Negative for visual changes. ENT: Negative for congestion Cardiovascular: Negative for chest pain. Respiratory: Negative for shortness of breath. Gastrointestinal: Negative for abdominal pain.  Positive for diarrhea.  Negative for vomiting.  Positive for nausea. Genitourinary: Negative for dysuria. Musculoskeletal: Negative for back pain. Skin: Negative for rash. Neurological: Negative for headache All other ROS negative  ____________________________________________   PHYSICAL EXAM:  VITAL SIGNS: ED Triage Vitals  Enc  Vitals Group     BP 08/08/17 1325 (!) 155/62     Pulse Rate 08/08/17 1325 68     Resp 08/08/17 1325 18     Temp 08/08/17 1325 98.2 F (36.8 C)     Temp Source 08/08/17 1325 Oral     SpO2 08/08/17 1325 98 %     Weight 08/08/17 1325 189 lb (85.7 kg)     Height 08/08/17 1325 5\' 1"  (1.549 m)     Head Circumference --      Peak Flow --      Pain Score 08/08/17 1324 8     Pain Loc --      Pain Edu? --      Excl. in [place]? --    Constitutional: Alert and oriented. Well appearing and in no distress. Eyes: Normal exam ENT   Head: Normocephalic and atraumatic.   Mouth/Throat: Mucous membranes are moist. Cardiovascular: Normal rate, regular rhythm. No murmur Respiratory: Normal respiratory effort without tachypnea nor retractions. Breath sounds are clear Gastrointestinal: Soft and nontender. No distention. Musculoskeletal: Nontender with normal range of motion in all extremities. Neurologic:  Normal speech and language. No gross focal neurologic deficits Skin:  Skin is warm, dry and intact.  Psychiatric: Mood and affect are normal  ____________________________________________    EKG  EKG reviewed and interpreted by myself shows normal sinus rhythm at 69 bpm with a narrow QRS, normal axis, normal intervals, no concerning ST changes.  ____________________________________________   INITIAL IMPRESSION / ASSESSMENT AND PLAN / ED COURSE  Pertinent labs & imaging results that were available during my care of the patient were reviewed by me and considered in my medical decision making (see chart for details).  Patient presents to the emergency department with weakness/fatigue, nausea, diarrhea.  Differential is quite broad but would include electrolyte abnormality, metabolic abnormality, infectious etiology, urinary tract infection, dehydration.  We will check labs, urinalysis, IV hydrate, treat with Zofran and closely monitor in the emergency department.  Overall the patient appears  well, nontoxic, no acute distress.  Patient's lab work is largely at baseline.  Slight renal insufficiency although largely baseline for the patient.  She is receiving IV fluids regardless.  CBC is unchanged.  Normal troponin and hepatic function panel.  Urinalysis is pending at this time.  Patient continues to appear well, no distress.  No current complaints.  Mild hyponatremia though  again unchanged from baseline.  Urinalysis pending.  Patient care signed out to oncoming physician. ____________________________________________   FINAL CLINICAL IMPRESSION(S) / ED DIAGNOSES  Weakness Nausea    Harvest Dark, MD 08/10/17 0710

## 2017-08-08 NOTE — ED Notes (Signed)
Patient given 4 oz orange juice to drink for CBG 65. Will recheck. Daughter at bedside with patient.

## 2017-10-03 NOTE — Progress Notes (Deleted)
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  INTERVAL HISTORY:   Patient returns to clinic today for routine 6 month evaluation. She currently feels well and is asymptomatic. She has chronic knee pain that is unchanged. She has no neurologic complaints. She denies any recent fevers or illnesses. She denies any other pain. She has a good appetite and denies weight loss. She has no chest pain or shortness of breath. She denies any nausea, vomiting, constipation, or diarrhea. She has no urinary complaints. Patient offers no further specific complaints today.   REVIEW OF SYSTEMS:   Review of Systems  Constitutional: Negative.  Negative for fever, malaise/fatigue and weight loss.  Respiratory: Negative.  Negative for cough and shortness of breath.   Cardiovascular: Negative.  Negative for chest pain and leg swelling.  Gastrointestinal: Negative.  Negative for abdominal pain.  Genitourinary: Negative.   Musculoskeletal: Positive for back pain and joint pain.  Skin: Negative.  Negative for rash.  Neurological: Negative.  Negative for weakness.  Psychiatric/Behavioral: Negative.     As per HPI. Otherwise, a complete review of systems is negative.  ONCOLOGY HISTORY: Oncology History   1. Carcinoma of the right breast T1c N0 M0 tumor based on ultrasound and the breast mammogram. ER negative, PR negative, HER-2/neu positive (diagnosis in July of 2015). 2. Iron deficiency anemia with chronic renal disease. 3. Status post mastectomy (right) in August of 2015. Patient has stage IC, ER negative, PR negative,  HER-2 receptor positive.  4. Patient started on chemotherapy with Taxol and Herceptin on April 09, 2014. 5. Because of progressive side effect Taxol was discontinued (August 28, 2014) Herceptin would be continued to July of 2016 6.  Because of swelling of lower extremity and shortness of breath Herceptin was discontinued in March of 2016.     Breast cancer of lower-outer quadrant of right female breast ([place])   02/28/2014 Initial Diagnosis    Breast cancer, right, invasive ductal       PAST MEDICAL HISTORY: Past Medical History:  Diagnosis Date  . Arthritis   . Breast cancer ([place]) 03/06/2014   right breast with mastectomy and chemo  . Cancer [facility])    Reports lumpectomy for a cyst  . CHF (congestive heart failure) ([place])   . Collagen vascular disease ([place])   . Diabetes ([place])   . DVT (deep venous thrombosis) ([place])   . Hemorrhoids   . Hypertension   . Renal disorder   . Renal insufficiency   . Stroke [facility])    August 2014, but has had strokes prior as well    PAST SURGICAL HISTORY: Past Surgical History:  Procedure Laterality Date  . ABDOMINAL HYSTERECTOMY     Patient not clear as to why  . BACK SURGERY    . BREAST BIOPSY Right February 15 2014   invasive mammary carcinoma/ER/PR negative, HER 2 positive  . BREAST BIOPSY Right 1999   negative biopsy  . BREAST SURGERY Right 03/06/14   mastectomy  . COLONOSCOPY WITH PROPOFOL N/A 04/15/2016   Procedure: COLONOSCOPY WITH PROPOFOL;  Surgeon: Manya Silvas, MD;  Location: [facility] ENDOSCOPY;  Service: Endoscopy;  Laterality:  N/A;  . ESOPHAGOGASTRODUODENOSCOPY (EGD) WITH PROPOFOL N/A 04/15/2016   Procedure: ESOPHAGOGASTRODUODENOSCOPY (EGD) WITH PROPOFOL;  Surgeon: Manya Silvas, MD;  Location: [facility] ENDOSCOPY;  Service: Endoscopy;  Laterality: N/A;  . HAND SURGERY Right    Carpel tunnel release in the 1970s  . LUMBAR LAMINECTOMY/DECOMPRESSION MICRODISCECTOMY Bilateral 07/24/2013   Procedure: LUMBAR LAMINECTOMY/DECOMPRESSION  MICRODISCECTOMY LUMBAR THREE-FOUR;  Surgeon: Charlie Pitter, MD;  Location: [place] NEURO ORS;  Service: Neurosurgery;  Laterality: Bilateral;  . MASTECTOMY Right 03/06/2014   right br ca    FAMILY HISTORY Family History  Problem Relation Age of Onset  . CAD Father   . Cancer Sister 6       breast  . Breast cancer Sister 23  . Cancer Brother        kidney  . Cancer Maternal Aunt        breast  . Breast cancer Maternal Aunt 70  . Cancer Other        maternal niece with breast cancer  . Breast cancer Other   . Cancer Sister        pancreatic    GYNECOLOGIC HISTORY:  No LMP recorded. Patient has had a hysterectomy.     ADVANCED DIRECTIVES:    HEALTH MAINTENANCE: Social History   Tobacco Use  . Smoking status: Never Smoker  . Smokeless tobacco: Never Used  Substance Use Topics  . Alcohol use: No  . Drug use: No    No Known Allergies  Current Outpatient Medications  Medication Sig Dispense Refill  . acetaminophen (TYLENOL) 500 MG tablet Take 500 mg by mouth every 6 (six) hours as needed.    . benazepril (LOTENSIN) 20 MG tablet Take 20 mg by mouth daily.    . clopidogrel (PLAVIX) 75 MG tablet Take 75 mg by mouth daily with breakfast.    . gabapentin (NEURONTIN) 600 MG tablet     . Glucosamine-Chondroit-Vit C-Mn (GLUCOSAMINE 1500 COMPLEX PO) Take by mouth 3 (three) times daily.    . hydrALAZINE (APRESOLINE) 50 MG tablet Take 50 mg by mouth 3 (three) times daily.     . insulin aspart (NOVOLOG) 100 UNIT/ML injection Inject 8 Units into the skin 3 (three) times daily before meals.    . insulin detemir (LEVEMIR) 100 UNIT/ML injection Inject 18 Units into the skin at bedtime.    . Melatonin 5 MG TABS Take 5 mg by mouth at bedtime.    . metoprolol succinate (TOPROL-XL) 100 MG 24 hr tablet Take 100 mg by mouth daily. Take with or immediately following a meal.    . Omega 3 1200 MG CAPS Take by mouth daily.     . ondansetron (ZOFRAN) 4 MG tablet Take 4 mg by mouth every 8 (eight)  hours as needed for nausea or vomiting.    . pravastatin (PRAVACHOL) 80 MG tablet Take 80 mg by mouth every evening.     . torsemide (DEMADEX) 20 MG tablet Take 1 tablet (20 mg total) by mouth daily. Start tomorrow 11/1 30 tablet 0   No current facility-administered medications for this visit.    Facility-Administered Medications Ordered in Other Visits  Medication Dose Route Frequency Provider Last Rate Last Dose  . sodium chloride flush (NS) 0.9 % injection 10 mL  10 mL Intravenous PRN Lloyd Huger, MD   10 mL at 09/23/16 1103    OBJECTIVE: There were no vitals taken for this visit.   There is no height or weight on file to calculate BMI.  ECOG FS:2 - Symptomatic, <50% confined to bed  General: Well-developed, well-nourished, no acute distress. Using walker for ambulation aid. Eyes: Pink conjunctiva, anicteric sclera. Lungs: Clear to auscultation bilaterally. Heart: Regular rate and rhythm. 3/6 systolic murmur present. Abdomen: Soft, nontender, nondistended. No organomegaly noted, normoactive bowel sounds. Breast: Right chest wall and axilla without lumps or masses.  Musculoskeletal: 2+ lower extremity bilateral edema, no cyanosis or clubbing. Neuro: Alert, answering all questions appropriately. Cranial nerves grossly intact. Skin: No rashes or petechiae noted. Psych: Normal affect.   LAB RESULTS:  No visits with results within 3 Day(s) from this visit.  Latest known visit with results is:  Admission on 08/08/2017, Discharged on 08/08/2017  Component Date Value Ref Range Status  . Sodium 08/08/2017 130* 135 - 145 mmol/L Final  . Potassium 08/08/2017 4.4  3.5 - 5.1 mmol/L Final  . Chloride 08/08/2017 98* 101 - 111 mmol/L Final  . CO2 08/08/2017 22  22 - 32 mmol/L Final  . Glucose, Bld 08/08/2017 77  65 - 99 mg/dL Final  . BUN 08/08/2017 23* 6 - 20 mg/dL Final  . Creatinine, Ser 08/08/2017 1.43* 0.44 - 1.00 mg/dL Final  . Calcium 08/08/2017 8.9  8.9 - 10.3 mg/dL Final    . GFR calc non Af Amer 08/08/2017 32* >60 mL/min Final  . GFR calc Af Amer 08/08/2017 37* >60 mL/min Final   Comment: (NOTE) The eGFR has been calculated using the CKD EPI equation. This calculation has not been validated in all clinical situations. eGFR's persistently <60 mL/min signify possible Chronic Kidney Disease.   Georgiann Hahn gap 08/08/2017 10  5 - 15 Final   Performed at [facility], [address] [place], [place] [address]  . Color, Urine 08/08/2017 YELLOW* YELLOW Final  . APPearance 08/08/2017 CLEAR* CLEAR Final  . Specific Gravity, Urine 08/08/2017 1.012  1.005 - 1.030 Final  . pH 08/08/2017 6.0  5.0 - 8.0 Final  . Glucose, UA 08/08/2017 NEGATIVE  NEGATIVE mg/dL Final  . Hgb urine dipstick 08/08/2017 NEGATIVE  NEGATIVE Final  . Bilirubin Urine 08/08/2017 NEGATIVE  NEGATIVE Final  . Ketones, ur 08/08/2017 5* NEGATIVE mg/dL Final  . Protein, ur 08/08/2017 30* NEGATIVE mg/dL Final  . Nitrite 08/08/2017 NEGATIVE  NEGATIVE Final  . Leukocytes, UA 08/08/2017 NEGATIVE  NEGATIVE Final  . RBC / HPF 08/08/2017 0-5  0 - 5 RBC/hpf Final  . WBC, UA 08/08/2017 0-5  0 - 5 WBC/hpf Final  . Bacteria, UA 08/08/2017 RARE* NONE SEEN Final  . Squamous Epithelial / LPF 08/08/2017 NONE SEEN  NONE SEEN Final   Performed at [facility], [address] [place], [place] [address]  . Glucose-Capillary 08/08/2017 65  65 - 99 mg/dL Final  . Total Protein 08/08/2017 7.1  6.5 - 8.1 g/dL Final  . Albumin 08/08/2017 3.7  3.5 - 5.0 g/dL Final  . AST 08/08/2017 22  15 - 41 U/L Final  . ALT 08/08/2017 10* 14 - 54 U/L Final  . Alkaline Phosphatase 08/08/2017 63  38 - 126 U/L Final  . Total Bilirubin 08/08/2017 0.9  0.3 - 1.2 mg/dL Final  . Bilirubin, Direct 08/08/2017 0.1  0.1 - 0.5 mg/dL Final  . Indirect Bilirubin 08/08/2017 0.8  0.3 - 0.9 mg/dL Final   Performed at [facility], [address], [place], [place] [address]  . Troponin I 08/08/2017 <0.03  <0.03 ng/mL Final    Performed at [facility], [address] [place], [place] [address]  .  Glucose-Capillary 08/08/2017 104* 65 - 99 mg/dL Final  . WBC 08/08/2017 5.4  3.6 - 11.0 K/uL Final  . RBC 08/08/2017 3.27* 3.80 - 5.20 MIL/uL Final  . Hemoglobin 08/08/2017 9.8* 12.0 - 16.0 g/dL Final  . HCT 08/08/2017 29.1* 35.0 - 47.0 % Final  . MCV 08/08/2017 89.0  80.0 - 100.0 fL Final  . MCH 08/08/2017 30.1  26.0 - 34.0 pg Final  . MCHC 08/08/2017 33.8  32.0 - 36.0 g/dL Final  . RDW 08/08/2017 14.9* 11.5 - 14.5 % Final  . Platelets 08/08/2017 232  150 - 440 K/uL Final   Performed at [facility], [address] [place], [place] [address]    STUDIES: No results found.  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  PLAN:    1. Stage Ia ER/PR negative, HER-2 positive adenoarcinoma of the lower outer quadrant of the right breast:  No evidence of recurrent disease. Patient is status post right mastectomy on March 06, 2014.  Patient also received Taxol and Herceptin between August 2015 and to January 2016. Taxol was discontinued secondary to side effects. Herceptin was also subsequently discontinued in March 2016. Patient's most recent mammogram on February 19, 2017 was reported as BI-RADS 1, repeat in July 2019. She does not require aromatase inhibitor given the ER/PR status of her tumor.  Return to clinic in 6 months with repeat laboratory work and further evaluation. 2. Joint pain: Chronic and unchanged. Continue treatment per primary care. 3. Anemia: Mild, monitor.  Patient expressed understanding and was in agreement with this plan. She also understands that She can call clinic at any time with any questions, concerns, or complaints.    Breast cancer, right, invasive ductal   Staging form: Breast, AJCC 7th Edition     Clinical: Stage IA (T1c, N0, M0) - Unsigned   Lloyd Huger, MD   10/03/2017 10:17  AM

## 2017-10-07 ENCOUNTER — Inpatient Hospital Stay: Payer: Medicare HMO | Admitting: Oncology

## 2017-12-09 ENCOUNTER — Other Ambulatory Visit: Payer: Self-pay

## 2017-12-09 ENCOUNTER — Encounter: Payer: Self-pay | Admitting: *Deleted

## 2017-12-09 ENCOUNTER — Encounter: Payer: Self-pay | Admitting: Emergency Medicine

## 2017-12-09 ENCOUNTER — Emergency Department: Payer: Medicare HMO

## 2017-12-09 ENCOUNTER — Encounter: Payer: Self-pay | Admitting: Family Medicine

## 2017-12-09 ENCOUNTER — Observation Stay
Admission: EM | Admit: 2017-12-09 | Discharge: 2017-12-11 | Disposition: A | Discharge status: Home / Self Care | Payer: Medicare HMO | Attending: Internal Medicine | Admitting: Internal Medicine

## 2017-12-09 DIAGNOSIS — Z853 Personal history of malignant neoplasm of breast: Secondary | ICD-10-CM | POA: Insufficient documentation

## 2017-12-09 DIAGNOSIS — Z794 Long term (current) use of insulin: Secondary | ICD-10-CM | POA: Insufficient documentation

## 2017-12-09 DIAGNOSIS — Z7902 Long term (current) use of antithrombotics/antiplatelets: Secondary | ICD-10-CM | POA: Diagnosis not present

## 2017-12-09 DIAGNOSIS — Z9011 Acquired absence of right breast and nipple: Secondary | ICD-10-CM | POA: Insufficient documentation

## 2017-12-09 DIAGNOSIS — Z79899 Other long term (current) drug therapy: Secondary | ICD-10-CM | POA: Diagnosis not present

## 2017-12-09 DIAGNOSIS — Z79891 Long term (current) use of opiate analgesic: Secondary | ICD-10-CM | POA: Insufficient documentation

## 2017-12-09 DIAGNOSIS — Z8673 Personal history of transient ischemic attack (TIA), and cerebral infarction without residual deficits: Secondary | ICD-10-CM | POA: Insufficient documentation

## 2017-12-09 DIAGNOSIS — Z86718 Personal history of other venous thrombosis and embolism: Secondary | ICD-10-CM | POA: Diagnosis not present

## 2017-12-09 DIAGNOSIS — Z9221 Personal history of antineoplastic chemotherapy: Secondary | ICD-10-CM | POA: Insufficient documentation

## 2017-12-09 DIAGNOSIS — D631 Anemia in chronic kidney disease: Secondary | ICD-10-CM | POA: Diagnosis not present

## 2017-12-09 DIAGNOSIS — G8929 Other chronic pain: Secondary | ICD-10-CM | POA: Diagnosis not present

## 2017-12-09 DIAGNOSIS — I509 Heart failure, unspecified: Secondary | ICD-10-CM | POA: Diagnosis not present

## 2017-12-09 DIAGNOSIS — I130 Hypertensive heart and chronic kidney disease with heart failure and stage 1 through stage 4 chronic kidney disease, or unspecified chronic kidney disease: Secondary | ICD-10-CM | POA: Insufficient documentation

## 2017-12-09 DIAGNOSIS — M545 Low back pain: Secondary | ICD-10-CM | POA: Insufficient documentation

## 2017-12-09 DIAGNOSIS — I160 Hypertensive urgency: Principal | ICD-10-CM | POA: Insufficient documentation

## 2017-12-09 DIAGNOSIS — E1122 Type 2 diabetes mellitus with diabetic chronic kidney disease: Secondary | ICD-10-CM | POA: Insufficient documentation

## 2017-12-09 DIAGNOSIS — N183 Chronic kidney disease, stage 3 (moderate): Secondary | ICD-10-CM | POA: Diagnosis not present

## 2017-12-09 LAB — APTT: aPTT: 28 seconds (ref 24–36)

## 2017-12-09 LAB — CBC WITH DIFFERENTIAL/PLATELET
BASOS ABS: 0.1 10*3/uL (ref 0–0.1)
Basophils Relative: 1 %
EOS PCT: 3 %
Eosinophils Absolute: 0.2 10*3/uL (ref 0–0.7)
HEMATOCRIT: 27.6 % — AB (ref 35.0–47.0)
Hemoglobin: 9.2 g/dL — ABNORMAL LOW (ref 12.0–16.0)
LYMPHS ABS: 1.3 10*3/uL (ref 1.0–3.6)
LYMPHS PCT: 23 %
MCH: 28.9 pg (ref 26.0–34.0)
MCHC: 33.2 g/dL (ref 32.0–36.0)
MCV: 87.2 fL (ref 80.0–100.0)
MONO ABS: 0.5 10*3/uL (ref 0.2–0.9)
Monocytes Relative: 8 %
Neutro Abs: 3.8 10*3/uL (ref 1.4–6.5)
Neutrophils Relative %: 65 %
PLATELETS: 295 10*3/uL (ref 150–440)
RBC: 3.17 MIL/uL — ABNORMAL LOW (ref 3.80–5.20)
RDW: 15.3 % — AB (ref 11.5–14.5)
WBC: 5.8 10*3/uL (ref 3.6–11.0)

## 2017-12-09 LAB — COMPREHENSIVE METABOLIC PANEL
ALT: 9 U/L — AB (ref 14–54)
AST: 16 U/L (ref 15–41)
Albumin: 3.5 g/dL (ref 3.5–5.0)
Alkaline Phosphatase: 64 U/L (ref 38–126)
Anion gap: 9 (ref 5–15)
BILIRUBIN TOTAL: 0.3 mg/dL (ref 0.3–1.2)
BUN: 15 mg/dL (ref 6–20)
CO2: 22 mmol/L (ref 22–32)
CREATININE: 1.1 mg/dL — AB (ref 0.44–1.00)
Calcium: 8.2 mg/dL — ABNORMAL LOW (ref 8.9–10.3)
Chloride: 104 mmol/L (ref 101–111)
GFR calc Af Amer: 51 mL/min — ABNORMAL LOW (ref >60)
GFR, EST NON AFRICAN AMERICAN: 44 mL/min — AB (ref >60)
Glucose, Bld: 130 mg/dL — ABNORMAL HIGH (ref 65–99)
POTASSIUM: 4.2 mmol/L (ref 3.5–5.1)
Sodium: 135 mmol/L (ref 135–145)
TOTAL PROTEIN: 6.9 g/dL (ref 6.5–8.1)

## 2017-12-09 LAB — BRAIN NATRIURETIC PEPTIDE: B NATRIURETIC PEPTIDE 5: 424 pg/mL — AB (ref 0.0–100.0)

## 2017-12-09 LAB — PROTIME-INR
INR: 0.95
Prothrombin Time: 12.6 seconds (ref 11.4–15.2)

## 2017-12-09 LAB — GLUCOSE, CAPILLARY: Glucose-Capillary: 246 mg/dL — ABNORMAL HIGH (ref 65–99)

## 2017-12-09 LAB — TROPONIN I: Troponin I: <0.03 ng/mL (ref <0.03)

## 2017-12-09 LAB — TSH: TSH: 1.382 u[IU]/mL (ref 0.350–4.500)

## 2017-12-09 LAB — MAGNESIUM: MAGNESIUM: 1.7 mg/dL (ref 1.7–2.4)

## 2017-12-09 MED ORDER — BENAZEPRIL HCL 20 MG PO TABS
20.0000 mg | ORAL_TABLET | Freq: Every day | ORAL | Status: DC
  Administered 2017-12-10: 20 mg via ORAL
  Filled 2017-12-09: qty 1

## 2017-12-09 MED ORDER — FUROSEMIDE 10 MG/ML IJ SOLN
40.0000 mg | Freq: Four times a day (QID) | INTRAMUSCULAR | Status: AC
  Administered 2017-12-10 (×2): 40 mg via INTRAVENOUS
  Filled 2017-12-09 (×2): qty 4

## 2017-12-09 MED ORDER — NITROGLYCERIN 2 % TD OINT
TOPICAL_OINTMENT | TRANSDERMAL | Status: AC
  Filled 2017-12-09: qty 1

## 2017-12-09 MED ORDER — NITROGLYCERIN 2 % TD OINT
0.5000 [in_us] | TOPICAL_OINTMENT | TRANSDERMAL | Status: AC
Start: 2017-12-09 — End: 2017-12-09
  Administered 2017-12-09: 0.5 [in_us] via TOPICAL

## 2017-12-09 MED ORDER — DOCUSATE SODIUM 100 MG PO CAPS
100.0000 mg | ORAL_CAPSULE | Freq: Two times a day (BID) | ORAL | Status: DC
  Administered 2017-12-10 – 2017-12-11 (×3): 100 mg via ORAL
  Filled 2017-12-09 (×3): qty 1

## 2017-12-09 MED ORDER — ACETAMINOPHEN 650 MG RE SUPP
650.0000 mg | Freq: Four times a day (QID) | RECTAL | Status: DC | PRN
Start: 2017-12-09 — End: 2017-12-11

## 2017-12-09 MED ORDER — FUROSEMIDE 10 MG/ML IJ SOLN
40.0000 mg | Freq: Once | INTRAMUSCULAR | Status: AC
  Administered 2017-12-09: 40 mg via INTRAVENOUS

## 2017-12-09 MED ORDER — ONDANSETRON HCL 4 MG PO TABS: 4.0000 mg | ORAL_TABLET | Freq: Four times a day (QID) | ORAL | Status: DC | PRN

## 2017-12-09 MED ORDER — ENOXAPARIN SODIUM 40 MG/0.4ML ~~LOC~~ SOLN
40.0000 mg | SUBCUTANEOUS | Status: DC
  Administered 2017-12-10: 40 mg via SUBCUTANEOUS
  Filled 2017-12-09: qty 0.4

## 2017-12-09 MED ORDER — ACETAMINOPHEN 325 MG PO TABS
650.0000 mg | ORAL_TABLET | Freq: Four times a day (QID) | ORAL | Status: DC | PRN
  Administered 2017-12-10: 650 mg via ORAL
  Filled 2017-12-09: qty 2

## 2017-12-09 MED ORDER — MELATONIN 5 MG PO TABS
5.0000 mg | ORAL_TABLET | Freq: Every day | ORAL | Status: DC
  Administered 2017-12-10 (×2): 5 mg via ORAL
  Filled 2017-12-09 (×3): qty 1

## 2017-12-09 MED ORDER — HYDRALAZINE HCL 50 MG PO TABS
50.0000 mg | ORAL_TABLET | Freq: Three times a day (TID) | ORAL | Status: DC
  Administered 2017-12-10 – 2017-12-11 (×5): 50 mg via ORAL
  Filled 2017-12-09 (×5): qty 1

## 2017-12-09 MED ORDER — INSULIN ASPART 100 UNIT/ML ~~LOC~~ SOLN
0.0000 [IU] | Freq: Three times a day (TID) | SUBCUTANEOUS | Status: DC
  Administered 2017-12-10 – 2017-12-11 (×2): 1 [IU] via SUBCUTANEOUS
  Filled 2017-12-09 (×2): qty 1

## 2017-12-09 MED ORDER — FUROSEMIDE 10 MG/ML IJ SOLN
INTRAMUSCULAR | Status: AC
  Filled 2017-12-09: qty 8

## 2017-12-09 MED ORDER — TORSEMIDE 20 MG PO TABS
20.0000 mg | ORAL_TABLET | Freq: Every day | ORAL | Status: DC
  Administered 2017-12-10 – 2017-12-11 (×2): 20 mg via ORAL
  Filled 2017-12-09 (×2): qty 1

## 2017-12-09 MED ORDER — INSULIN DETEMIR 100 UNIT/ML ~~LOC~~ SOLN
12.0000 [IU] | Freq: Every day | SUBCUTANEOUS | Status: DC
  Administered 2017-12-10 (×2): 12 [IU] via SUBCUTANEOUS
  Filled 2017-12-09 (×3): qty 0.12

## 2017-12-09 MED ORDER — METOPROLOL SUCCINATE ER 100 MG PO TB24
100.0000 mg | ORAL_TABLET | Freq: Every day | ORAL | Status: DC
  Administered 2017-12-10 – 2017-12-11 (×2): 100 mg via ORAL
  Filled 2017-12-09 (×2): qty 1

## 2017-12-09 MED ORDER — CLOPIDOGREL BISULFATE 75 MG PO TABS
75.0000 mg | ORAL_TABLET | Freq: Every day | ORAL | Status: DC
  Administered 2017-12-10 – 2017-12-11 (×2): 75 mg via ORAL
  Filled 2017-12-09 (×2): qty 1

## 2017-12-09 MED ORDER — ONDANSETRON HCL 4 MG/2ML IJ SOLN: 4.0000 mg | Freq: Four times a day (QID) | INTRAMUSCULAR | Status: DC | PRN

## 2017-12-09 NOTE — H&P (Signed)
Kristen Barron is an 84 y.o. female.   Chief Complaint: Leg swelling HPI: Patient with past medical history of breast cancer status post mastectomy and chemotherapy, hypertension and diabetes presents to the emergency department due to leg swelling.  It became immediately apparent upon arrival to the patient's main concern was shortness of breath.  She admits to gradually worsening dyspnea over the last few days.  She denies chest pain, nausea or diaphoresis.  Patient's blood pressure was found to be greater than 409 systolic.  Nitropaste was applied to her chest and the patient was given Lasix 40 mg IV prior to the emergency department staff call the hospitalist service for admission.  Past Medical History:  Diagnosis Date  . Arthritis   . Breast cancer ([place]) 03/06/2014   right breast with mastectomy and chemo  . Cancer [facility])    Reports lumpectomy for a cyst  . CHF (congestive heart failure) ([place])   . Collagen vascular disease ([place])   . Diabetes ([place])   . DVT (deep venous thrombosis) ([place])   . Hemorrhoids   . Hypertension   . Renal disorder   . Renal insufficiency   . Stroke [facility])    August 2014, but has had strokes prior as well    Past Surgical History:  Procedure Laterality Date  . ABDOMINAL HYSTERECTOMY     Patient not clear as to why  . BACK SURGERY    . BREAST BIOPSY Right February 15 2014   invasive mammary carcinoma/ER/PR negative, HER 2 positive  . BREAST BIOPSY Right 1999   negative biopsy  . BREAST SURGERY Right 03/06/14   mastectomy  . COLONOSCOPY WITH PROPOFOL N/A 04/15/2016   Procedure: COLONOSCOPY WITH PROPOFOL;  Surgeon: Manya Silvas, MD;  Location: [facility] ENDOSCOPY;  Service: Endoscopy;  Laterality: N/A;  . ESOPHAGOGASTRODUODENOSCOPY (EGD) WITH PROPOFOL N/A 04/15/2016   Procedure: ESOPHAGOGASTRODUODENOSCOPY (EGD) WITH PROPOFOL;  Surgeon: Manya Silvas, MD;  Location: [facility] ENDOSCOPY;  Service: Endoscopy;  Laterality: N/A;  . HAND SURGERY Right    Carpel tunnel release  in the 1970s  . LUMBAR LAMINECTOMY/DECOMPRESSION MICRODISCECTOMY Bilateral 07/24/2013   Procedure: LUMBAR LAMINECTOMY/DECOMPRESSION MICRODISCECTOMY LUMBAR THREE-FOUR;  Surgeon: Charlie Pitter, MD;  Location: [place] NEURO ORS;  Service: Neurosurgery;  Laterality: Bilateral;  . MASTECTOMY Right 03/06/2014   right br ca    Family History  Problem Relation Age of Onset  . CAD Father   . Cancer Sister 44       breast  . Breast cancer Sister 80  . Cancer Brother        kidney  . Cancer Maternal Aunt        breast  . Breast cancer Maternal Aunt 70  . Cancer Other        maternal niece with breast cancer  . Breast cancer Other   . Cancer Sister        pancreatic   Social History:  reports that she has never smoked. She has never used smokeless tobacco. She reports that she does not drink alcohol or use drugs.  Allergies: No Known Allergies  Medications Prior to Admission  Medication Sig Dispense Refill  . NORCO 5-325 MG tablet Take 1 tablet by mouth 4 (four) times daily.    Marland Kitchen acetaminophen (TYLENOL) 500 MG tablet Take 500 mg by mouth every 6 (six) hours as needed.    . benazepril (LOTENSIN) 20 MG tablet Take 20 mg by mouth daily.    . clopidogrel (PLAVIX) 75 MG tablet  Take 75 mg by mouth daily with breakfast.    . gabapentin (NEURONTIN) 600 MG tablet     . Glucosamine-Chondroit-Vit C-Mn (GLUCOSAMINE 1500 COMPLEX PO) Take by mouth 3 (three) times daily.    . hydrALAZINE (APRESOLINE) 50 MG tablet Take 50 mg by mouth 3 (three) times daily.     . insulin aspart (NOVOLOG) 100 UNIT/ML injection Inject 8 Units into the skin 3 (three) times daily before meals.    . insulin detemir (LEVEMIR) 100 UNIT/ML injection Inject 18 Units into the skin at bedtime.    . Melatonin 5 MG TABS Take 5 mg by mouth at bedtime.    . metoprolol succinate (TOPROL-XL) 100 MG 24 hr tablet Take 100 mg by mouth daily. Take with or immediately following a meal.    . Omega 3 1200 MG CAPS Take by mouth daily.     .  ondansetron (ZOFRAN) 4 MG tablet Take 4 mg by mouth every 8 (eight) hours as needed for nausea or vomiting.    . pravastatin (PRAVACHOL) 80 MG tablet Take 80 mg by mouth every evening.     . torsemide (DEMADEX) 20 MG tablet Take 1 tablet (20 mg total) by mouth daily. Start tomorrow 11/1 30 tablet 0    Results for orders placed or performed during the hospital encounter of 12/09/17 (from the past 48 hour(s))  CBC with Differential     Status: Abnormal   Collection Time: 12/09/17  5:42 PM  Result Value Ref Range   WBC 5.8 3.6 - 11.0 K/uL   RBC 3.17 (L) 3.80 - 5.20 MIL/uL   Hemoglobin 9.2 (L) 12.0 - 16.0 g/dL   HCT 27.6 (L) 35.0 - 47.0 %   MCV 87.2 80.0 - 100.0 fL   MCH 28.9 26.0 - 34.0 pg   MCHC 33.2 32.0 - 36.0 g/dL   RDW 15.3 (H) 11.5 - 14.5 %   Platelets 295 150 - 440 K/uL   Neutrophils Relative % 65 %   Neutro Abs 3.8 1.4 - 6.5 K/uL   Lymphocytes Relative 23 %   Lymphs Abs 1.3 1.0 - 3.6 K/uL   Monocytes Relative 8 %   Monocytes Absolute 0.5 0.2 - 0.9 K/uL   Eosinophils Relative 3 %   Eosinophils Absolute 0.2 0 - 0.7 K/uL   Basophils Relative 1 %   Basophils Absolute 0.1 0 - 0.1 K/uL    Comment: Performed at [facility], [address] [place], [place] [address]  Comprehensive metabolic panel     Status: Abnormal   Collection Time: 12/09/17  5:42 PM  Result Value Ref Range   Sodium 135 135 - 145 mmol/L   Potassium 4.2 3.5 - 5.1 mmol/L   Chloride 104 101 - 111 mmol/L   CO2 22 22 - 32 mmol/L   Glucose, Bld 130 (H) 65 - 99 mg/dL   BUN 15 6 - 20 mg/dL   Creatinine, Ser 1.10 (H) 0.44 - 1.00 mg/dL   Calcium 8.2 (L) 8.9 - 10.3 mg/dL   Total Protein 6.9 6.5 - 8.1 g/dL   Albumin 3.5 3.5 - 5.0 g/dL   AST 16 15 - 41 U/L   ALT 9 (L) 14 - 54 U/L   Alkaline Phosphatase 64 38 - 126 U/L   Total Bilirubin 0.3 0.3 - 1.2 mg/dL   GFR calc non Af Amer 44 (L) >60 mL/min   GFR calc Af Amer 51 (L) >60 mL/min    Comment: (NOTE) The eGFR has been calculated  using the CKD EPI  equation. This calculation has not been validated in all clinical situations. eGFR's persistently <60 mL/min signify possible Chronic Kidney Disease.    Anion gap 9 5 - 15    Comment: Performed at [facility], [address] [place], [place] [address]  Brain natriuretic peptide     Status: Abnormal   Collection Time: 12/09/17  5:42 PM  Result Value Ref Range   B Natriuretic Peptide 424.0 (H) 0.0 - 100.0 pg/mL    Comment: Performed at [facility], [address], [place], [place] [address]  Magnesium     Status: None   Collection Time: 12/09/17  5:42 PM  Result Value Ref Range   Magnesium 1.7 1.7 - 2.4 mg/dL    Comment: Performed at [facility], [address] [place], [place] [address]  Troponin I     Status: None   Collection Time: 12/09/17  7:24 PM  Result Value Ref Range   Troponin I <0.03 <0.03 ng/mL    Comment: Performed at [facility], [address] [place], [place] [address]  Protime-INR     Status: None   Collection Time: 12/09/17  7:24 PM  Result Value Ref Range   Prothrombin Time 12.6 11.4 - 15.2 seconds   INR 0.95     Comment: Performed at [facility], [address] [place], [place] [address]  APTT     Status: None   Collection Time: 12/09/17  7:24 PM  Result Value Ref Range   aPTT 28 24 - 36 seconds    Comment: Performed at [facility], [address] [place], [place] [address]  Glucose, capillary     Status: Abnormal   Collection Time: 12/09/17 11:23 PM  Result Value Ref Range   Glucose-Capillary 246 (H) 65 - 99 mg/dL   Comment 1 Notify RN    Comment 2 Document in Chart    Dg Chest 2 View  Result Date: 12/09/2017 CLINICAL DATA:  Patient with lower extremity edema. EXAM: CHEST - 2 VIEW COMPARISON:  Chest radiograph 06/07/2017. FINDINGS: Left anterior chest wall Port-A-Cath is present with tip terminating in the central left brachiocephalic vein. Stable cardiac and mediastinal contours. Aortic  atherosclerosis. Interval development of diffuse bilateral interstitial pulmonary opacities. Small right pleural effusion. Thoracic spine degenerative changes. No pneumothorax. IMPRESSION: New interstitial opacities bilaterally concerning for pulmonary edema. Small right pleural effusion. Electronically Signed   By: Lovey Newcomer M.D.   On: 12/09/2017 18:18   US Venous Img Lower Bilateral  Result Date: 12/09/2017 CLINICAL DATA:  84 year old female with bilateral lower extremity swelling for the past 4 days EXAM: BILATERAL LOWER EXTREMITY VENOUS DOPPLER ULTRASOUND TECHNIQUE: Gray-scale sonography with graded compression, as well as color Doppler and duplex ultrasound were performed to evaluate the lower extremity deep venous systems from the level of the common femoral vein and including the common femoral, femoral, profunda femoral, popliteal and calf veins including the posterior tibial, peroneal and gastrocnemius veins when visible. The superficial great saphenous vein was also interrogated. Spectral Doppler was utilized to evaluate flow at rest and with distal augmentation maneuvers in the common femoral, femoral and popliteal veins. COMPARISON:  None. FINDINGS: RIGHT LOWER EXTREMITY Common Femoral Vein: No evidence of thrombus. Normal compressibility, respiratory phasicity and response to augmentation. Increased pulsatility of the venous waveform. Saphenofemoral Junction: No evidence of thrombus. Normal compressibility and flow on color Doppler imaging. Profunda Femoral Vein: No evidence of thrombus. Normal compressibility and flow on  color Doppler imaging. Femoral Vein: No evidence of thrombus. Normal compressibility, respiratory phasicity and response to augmentation. Popliteal Vein: No evidence of thrombus. Normal compressibility, respiratory phasicity and response to augmentation. Calf Veins: No evidence of thrombus. Normal compressibility and flow on color Doppler imaging. Superficial Great Saphenous Vein:  No evidence of thrombus. Normal compressibility. Venous Reflux:  None. Other Findings:  None. LEFT LOWER EXTREMITY Common Femoral Vein: No evidence of thrombus. Normal compressibility, respiratory phasicity and response to augmentation. Increased pulsatility of the venous waveform. Saphenofemoral Junction: No evidence of thrombus. Normal compressibility and flow on color Doppler imaging. Profunda Femoral Vein: No evidence of thrombus. Normal compressibility and flow on color Doppler imaging. Femoral Vein: No evidence of thrombus. Normal compressibility, respiratory phasicity and response to augmentation. Popliteal Vein: No evidence of thrombus. Normal compressibility, respiratory phasicity and response to augmentation. Calf Veins: No evidence of thrombus. Normal compressibility and flow on color Doppler imaging. Superficial Great Saphenous Vein: No evidence of thrombus. Normal compressibility. Venous Reflux:  None. Other Findings:  None. IMPRESSION: 1. No evidence of deep venous thrombosis in either lower extremity. 2. Symmetric bilateral increased pulsatility of the venous waveforms as can be seen in the setting of elevated right heart pressures. Differential considerations include tricuspid regurgitation, right heart failure, pulmonary arterial hypertension and COPD. Electronically Signed   By: Jacqulynn Cadet M.D.   On: 12/09/2017 20:43    Review of Systems  Constitutional: Negative for chills and fever.  HENT: Negative for sore throat and tinnitus.   Eyes: Negative for blurred vision and redness.  Respiratory: Positive for shortness of breath. Negative for cough.   Cardiovascular: Negative for chest pain, palpitations, orthopnea and PND.  Gastrointestinal: Negative for abdominal pain, diarrhea, nausea and vomiting.  Genitourinary: Negative for dysuria, frequency and urgency.  Musculoskeletal: Negative for joint pain and myalgias.  Skin: Negative for rash.       No lesions  Neurological: Negative  for speech change, focal weakness and weakness.  Endo/Heme/Allergies: Does not bruise/bleed easily.       No temperature intolerance  Psychiatric/Behavioral: Negative for depression and suicidal ideas.    Blood pressure (!) 199/76, pulse 70, temperature 98.3 F (36.8 C), temperature source Oral, resp. rate 16, height 5' 1" (1.549 m), weight 79.5 kg (175 lb 4.8 oz), SpO2 96 %. Physical Exam  Vitals reviewed. Constitutional: She is oriented to person, place, and time. She appears well-developed and well-nourished. No distress.  HENT:  Head: Normocephalic and atraumatic.  Mouth/Throat: Oropharynx is clear and moist.  Eyes: Pupils are equal, round, and reactive to light. Conjunctivae and EOM are normal. No scleral icterus.  Neck: Normal range of motion. Neck supple. No JVD present. No tracheal deviation present. No thyromegaly present.  Cardiovascular: Normal rate, regular rhythm and normal heart sounds. Exam reveals no gallop and no friction rub.  No murmur heard. Respiratory: Effort normal and breath sounds normal.  GI: Soft. Bowel sounds are normal. She exhibits no distension. There is no tenderness.  Genitourinary:  Genitourinary Comments: Deferred  Musculoskeletal: Normal range of motion. She exhibits no edema.  Lymphadenopathy:    She has no cervical adenopathy.  Neurological: She is alert and oriented to person, place, and time. No cranial nerve deficit. She exhibits normal muscle tone.  Skin: Skin is warm and dry. No rash noted. No erythema.  Psychiatric: She has a normal mood and affect. Her behavior is normal. Judgment and thought content normal.     Assessment/Plan This is an 84 year old female admitted for  hypertensive urgency. 1.  Hypertensive urgency: Exacerbating CHF.  Patient does not have a cardiologist and does not have an echocardiogram on file.  I suspect she may have diastolic failure.  Obtain echocardiogram.  Continue benazepril, hydralazine and metoprolol.  Consult  cardiology 2.  Pulmonary edema: Improving with IV Lasix.  Resume torsemide p.o. in the morning 3.  CKD: Stage III; avoid nephrotoxic agents. 4.  Diabetes mellitus type 2: Continue basal insulin therapy as well as sliding cell insulin while hospitalized 5.  Cerebrovascular disease: History of stroke; continue Plavix 6.  DVT prophylaxis: Lovenox 7.  GI prophylaxis: None Patient is a full code.  Time spent on initial exam patient care proximally 45 minutes  Harrie Foreman, MD 12/09/2017, 11:52 PM

## 2017-12-09 NOTE — ED Notes (Signed)
Patient transported to Ultrasound 

## 2017-12-09 NOTE — ED Notes (Signed)
This RN spoke with Dr. Burlene Arnt regarding pt's care, see orders.

## 2017-12-09 NOTE — ED Notes (Addendum)
Granddaughter Billee Cashing: (EVENINGS) cell [phone], [facility] [phone]  Password: [place]   Pt verbalized consent to release medical information to [facility] to this RN

## 2017-12-09 NOTE — ED Triage Notes (Signed)
Pt presents to ED via POV with c/o bilateral LE edema. Pt states has been seeing her PCP for lew swelling but her PCP was unable to see her today. Pt also c/o BLE pain.

## 2017-12-09 NOTE — ED Provider Notes (Addendum)
[facility] Emergency Department Provider Note  ____________________________________________   I have reviewed the triage vital signs and the nursing notes. Where available I have reviewed prior notes and, if possible and indicated, outside hospital notes.    HISTORY  Chief Complaint Leg Swelling    HPI Kristen Barron is a 84 y.o. female  with a history doctor in a CHF but I cannot find any history known ofa echo or a EF, patient does take torsemide, prescribed by her primary care doctor. She does not have a cardiologist.she has not had a heart attack which she has had DVTs in the past She states over the last 4 or 5 days she's had increased lower extremity swelling,she states her right leg is chronically more swollen than the left and it seems to be like at this time. She states she has orthopnea always sleeps on 3 pillows, does not believe she's had to change that. She has had however exertional dyspnea associated with her bilateral lower extremity swelling. Patient has had no change in her medications no chest pain.she has had lower extremity swelling for about a week. Nothing makes it better and nothing makes it worse.    Past Medical History:  Diagnosis Date  . Arthritis   . Breast cancer ([place]) 03/06/2014   right breast with mastectomy and chemo  . Cancer [facility])    Reports lumpectomy for a cyst  . CHF (congestive heart failure) ([place])   . Collagen vascular disease ([place])   . Diabetes ([place])   . DVT (deep venous thrombosis) ([place])   . Hemorrhoids   . Hypertension   . Renal disorder   . Renal insufficiency   . Stroke [facility])    August 2014, but has had strokes prior as well    Patient Active Problem List   Diagnosis Date Noted  . Pressure injury of skin 06/08/2017  . Abdominal pain 04/09/2016  . Right lower quadrant pain   . Vomiting   . Gallstone   . Osteoarthritis of knee (Bilateral) (L>R) 12/26/2015  . Chronic pain 12/11/2015  . Long term current  use of opiate analgesic 12/11/2015  . Encounter for therapeutic drug level monitoring 12/11/2015  . Chronic knee pain (Location of Primary Source of Pain) (Bilateral) (L>R) 12/11/2015  . Long term prescription opiate use 12/11/2015  . Opiate use (20 MME/Day) 12/11/2015  . Chronic hand pain (Location of Secondary source of pain) (Bilateral) (L>R) 12/11/2015  . Failed back surgical syndrome x 2 (Surgeon: Dr. Deri Fuelling) 12/11/2015  . Lumbar spondylosis 12/11/2015  . Chronic low back pain (Location of Tertiary source of pain) (Bilateral) (L>R) 12/11/2015  . Lumbar facet syndrome 12/11/2015  . Chronic lower extremity pain (Bilateral) (L>R) 12/11/2015  . Chronic neck pain (Left) 12/11/2015  . Cervical spondylosis 12/11/2015  . Chronic shoulder pain (Right) 12/11/2015  . Insulin dependent diabetes mellitus ([place]) 12/11/2015  . Abnormal MRI, lumbar spine (07/19/2013) 12/11/2015  . Lumbar postlaminectomy syndrome (L4-5) 12/11/2015  . Fusion of lumbar spine (L4-5 Ray cages) 12/11/2015  . Lumbar Levoscoliosis with apex at L4 12/11/2015  . Grade 1 Retrolisthesis of L2 over L3 and L3 over L4 12/11/2015  . Lumbar facet arthropathy 12/11/2015  . Lumbar foraminal stenosis (Bilateral L2-3, L3-4 & Left L5-S1) 12/11/2015  . Chronic anticoagulation (Plavix) 12/11/2015  . Breast cancer of lower-outer quadrant of right female breast ([place]) 02/28/2014  . Lumbar stenosis with neurogenic claudication 07/24/2013  . Lumbar central spinal stenosis ( Severe at L3-4) (Mild R>L  L2-3 & L5-S1) 07/20/2013  . Hyponatremia 07/20/2013  . Hypertension 07/20/2013  . Normocytic anemia 07/20/2013  . CKD (chronic kidney disease) stage 3, GFR 30-59 ml/min (HCC) 07/20/2013  .  Lumbar DDD (degenerative disc disease) 07/20/2013    Past Surgical History:  Procedure Laterality Date  . ABDOMINAL HYSTERECTOMY     Patient not clear as to why  . BACK SURGERY    . BREAST BIOPSY Right February 15 2014   invasive mammary carcinoma/ER/PR  negative, HER 2 positive  . BREAST BIOPSY Right 1999   negative biopsy  . BREAST SURGERY Right 03/06/14   mastectomy  . COLONOSCOPY WITH PROPOFOL N/A 04/15/2016   Procedure: COLONOSCOPY WITH PROPOFOL;  Surgeon: Manya Silvas, MD;  Location[facility] ENDOSCOPY;  Service: Endoscopy;  Laterality: N/A;  . ESOPHAGOGASTRODUODENOSCOPY (EGD) WITH PROPOFOL N/A 04/15/2016   Procedure: ESOPHAGOGASTRODUODENOSCOPY (EGD) WITH PROPOFOL;  Surgeon: Manya Silvas, MD;  [facility] END[facility]COPY;  Service: Endoscopy;  Laterality: N/A;  . HAND SURGERY Right    Carpel tunnel release in the 1970s  . LUMBAR LAMINECTOMY/DECOMPRESSION MICRODISCECTOMY Bilateral 07/24/2013   Procedure: LUMBAR LAMINECTOMY/DECOMPRESSION MICRODISCECTOMY LUMBAR THREE-FOUR;  Surgeon: Charlie Pitter, MD;  Location: [place] NEURO ORS;  Service: Neurosurgery;  Laterality: Bilateral;  . MASTECTOMY Right 03/06/2014   right br ca    Prior to Admission medications   Medication Sig Start Date End Date Taking? Authorizing Provider  acetaminophen (TYLENOL) 500 MG tablet Take 500 mg by mouth every 6 (six) hours as needed.    [provider]  benazepril (LOTENSIN) 20 MG tablet Take 20 mg by mouth daily. 10/15/16   [provider]  clopidogrel (PLAVIX) 75 MG tablet Take 75 mg by mouth daily with breakfast.    [provider]  gabapentin (NEURONTIN) 600 MG tablet  11/25/16   [provider]  Glucosamine-Chondroit-Vit C-Mn (GLUCOSAMINE 1500 COMPLEX PO) Take by mouth 3 (three) times daily.    [provider]  hydrALAZINE (APRESOLINE) 50 MG tablet Take 50 mg by mouth 3 (three) times daily.     [provider]  insulin aspart (NOVOLOG) 100 UNIT/ML injection Inject 8 Units into the skin 3 (three) times daily before meals.    [provider]  insulin detemir (LEVEMIR) 100 UNIT/ML injection Inject 18 Units into the skin at bedtime.    [provider]  Melatonin 5 MG TABS Take 5 mg by mouth at bedtime.     [provider]  metoprolol succinate (TOPROL-XL) 100 MG 24 hr tablet Take 100 mg by mouth daily. Take with or immediately following a meal.    [provider]  Omega 3 1200 MG CAPS Take by mouth daily.     [provider]  ondansetron (ZOFRAN) 4 MG tablet Take 4 mg by mouth every 8 (eight) hours as needed for nausea or vomiting.    [provider]  pravastatin (PRAVACHOL) 80 MG tablet Take 80 mg by mouth every evening.     [provider]  torsemide (DEMADEX) 20 MG tablet Take 1 tablet (20 mg total) by mouth daily. Start tomorrow 11/1 06/10/17   Max Sane, MD    Allergies Patient has no known allergies.  Family History  Problem Relation Age of Onset  . CAD Father   . Cancer Sister 62       breast  . Breast cancer Sister 73  . Cancer Brother        kidney  . Cancer Maternal Aunt  breast  . Breast cancer Maternal Aunt 70  . Cancer Other        maternal niece with breast cancer  . Breast cancer Other   . Cancer Sister        pancreatic    Social History Social History   Tobacco Use  . Smoking status: Never Smoker  . Smokeless tobacco: Never Used  Substance Use Topics  . Alcohol use: No  . Drug use: No    Review of Systems Constitutional: No fever/chills Eyes: No visual changes. ENT: No sore throat. No stiff neck no neck pain Cardiovascular: + chest pain. Respiratory: + shortness of breath. Gastrointestinal:   no vomiting.  No diarrhea.  No constipation. Genitourinary: Negative for dysuria. Musculoskeletal: + lower extremity swelling Skin: Negative for rash. Neurological: Negative for severe headaches, focal weakness or numbness.   ____________________________________________   PHYSICAL EXAM:  VITAL SIGNS: ED Triage Vitals [12/09/17 1735]  Enc Vitals Group     BP (!) 190/55     Pulse Rate 68     Resp 18     Temp 98.4 F (36.9 C)     Temp Source Oral     SpO2 95 %     Weight 170 lb (77.1 kg)      Height 5' 1.5" (1.562 m)     Head Circumference      Peak Flow      Pain Score 8     Pain Loc      Pain Edu?      Excl. in [place]?     Constitutional: Alert and oriented. Well appearing and in no acute distress. Eyes: Conjunctivae are normal Head: Atraumatic HEENT: No congestion/rhinnorhea. Mucous membranes are moist.  Oropharynx non-erythematous Neck:   Nontender with no meningismus, no masses, no stridor Cardiovascular: Normal rate, regular rhythm. Grossly normal heart sounds.  Good peripheral circulation. Respiratory: Normal respiratory effort.  No retractions. Lungs patient with rhonchorous breath sounds in the bases Abdominal: Soft and nontender. No distention. No guarding no rebound Back:  There is no focal tenderness or step off.  there is no midline tenderness there are no lesions noted. there is no CVA tenderness Musculoskeletal: No lower extremity tenderness, no upper extremity tenderness. No joint effusions, there is bilateral lower extremity swelling, right greater than left. . Perfusion noted. Neurologic:  Normal speech and language. No gross focal neurologic deficits are appreciated.  Skin:  Skin is warm, dry and intact. No rash noted. Psychiatric: Mood and affect are normal. Speech and behavior are normal.  ____________________________________________   LABS (all labs ordered are listed, but only abnormal results are displayed)  Labs Reviewed  CBC WITH DIFFERENTIAL/PLATELET - Abnormal; Notable for the following components:      Result Value   RBC 3.17 (*)    Hemoglobin 9.2 (*)    HCT 27.6 (*)    RDW 15.3 (*)    All other components within normal limits  COMPREHENSIVE METABOLIC PANEL - Abnormal; Notable for the following components:   Glucose, Bld 130 (*)    Creatinine, Ser 1.10 (*)    Calcium 8.2 (*)    ALT 9 (*)    GFR calc non Af Amer 44 (*)    GFR calc Af Amer 51 (*)    All other components within normal limits  BRAIN NATRIURETIC PEPTIDE - Abnormal; Notable  for the following components:   B Natriuretic Peptide 424.0 (*)    All other components within normal limits  MAGNESIUM  Pertinent labs  results that were available during my care of the patient were reviewed by me and considered in my medical decision making (see chart for details). ____________________________________________  EKG  I personally interpreted any EKGs ordered by me or triage Late entry: Sinus rhythm, rate 67, PACs no acute ischemic changes.  ____________________________________________  RADIOLOGY  Pertinent labs & imaging results that were available during my care of the patient were reviewed by me and considered in my medical decision making (see chart for details). If possible, patient and/or family made aware of any abnormal findings.  Dg Chest 2 View  Result Date: 12/09/2017 CLINICAL DATA:  Patient with lower extremity edema. EXAM: CHEST - 2 VIEW COMPARISON:  Chest radiograph 06/07/2017. FINDINGS: Left anterior chest wall Port-A-Cath is present with tip terminating in the central left brachiocephalic vein. Stable cardiac and mediastinal contours. Aortic atherosclerosis. Interval development of diffuse bilateral interstitial pulmonary opacities. Small right pleural effusion. Thoracic spine degenerative changes. No pneumothorax. IMPRESSION: New interstitial opacities bilaterally concerning for pulmonary edema. Small right pleural effusion. Electronically Signed   By: Lovey Newcomer M.D.   On: 12/09/2017 18:18   ____________________________________________    PROCEDURES  Procedure(s) performed: None  Procedures  Critical Care performed: None  ____________________________________________   INITIAL IMPRESSION / ASSESSMENT AND PLAN / ED COURSE  Pertinent labs & imaging results that were available during my care of the patient were reviewed by me and considered in my medical decision making (see chart for details).  patient here with some evidence of  decompensated heart failure, she has bilateral lower extremity edema and pulmonary edema on chest x-ray. She has no fever and no chest pain. We'll obtain troponins and EKG as a precaution and shows that this does not result from acute coronary artery disease, ACS. Unclear what kind of CHF she has I can't find any notes from a cardiologist. Patient does apparently have some history of DVT we'll obtain ultrasounds to rule out DVT , which likely will be followed up as an inpatient, howevergiven her fluid and her BNP S suspect this is most likely decompensated CHF, the reason for the  decompensation is not clear. We will admit the patient.    ____________________________________________   FINAL CLINICAL IMPRESSION(S) / ED DIAGNOSES  Final diagnoses:  Acute on chronic congestive heart failure, unspecified heart failure type [facility])      This chart was dictated using voice recognition software.  Despite best efforts to proofread,  errors can occur which can change meaning.      Schuyler Amor, MD 12/09/17 1924    Schuyler Amor, MD 12/21/17 2241

## 2017-12-09 NOTE — ED Notes (Signed)
Pt c/o bilateral leg edema x4 days. Pt reports pain when ambulating and SOB.

## 2017-12-09 NOTE — ED Notes (Signed)
Admitting MD at bedside.

## 2017-12-10 ENCOUNTER — Observation Stay
Admit: 2017-12-10 | Discharge: 2017-12-10 | Disposition: A | Discharge status: Home / Self Care | Payer: Medicare HMO | Attending: Internal Medicine | Admitting: Internal Medicine

## 2017-12-10 LAB — HEMOGLOBIN A1C
Hgb A1c MFr Bld: 5.7 % — ABNORMAL HIGH (ref 4.8–5.6)
Mean Plasma Glucose: 116.89 mg/dL

## 2017-12-10 LAB — GLUCOSE, CAPILLARY
Glucose-Capillary: 104 mg/dL — ABNORMAL HIGH (ref 65–99)
Glucose-Capillary: 150 mg/dL — ABNORMAL HIGH (ref 65–99)
Glucose-Capillary: 110 mg/dL — ABNORMAL HIGH (ref 65–99)
Glucose-Capillary: 140 mg/dL — ABNORMAL HIGH (ref 65–99)

## 2017-12-10 MED ORDER — BENAZEPRIL HCL 20 MG PO TABS
40.0000 mg | ORAL_TABLET | Freq: Every day | ORAL | Status: DC
  Administered 2017-12-11: 40 mg via ORAL
  Filled 2017-12-10: qty 2

## 2017-12-10 MED ORDER — HYDROCODONE-ACETAMINOPHEN 5-325 MG PO TABS
1.0000 | ORAL_TABLET | Freq: Four times a day (QID) | ORAL | Status: DC | PRN
Start: 2017-12-10 — End: 2017-12-11
  Administered 2017-12-10 – 2017-12-11 (×3): 1 via ORAL
  Filled 2017-12-10 (×3): qty 1

## 2017-12-10 MED ORDER — MAGNESIUM SULFATE 2 GM/50ML IV SOLN
2.0000 g | Freq: Once | INTRAVENOUS | Status: AC
  Administered 2017-12-10: 2 g via INTRAVENOUS
  Filled 2017-12-10: qty 50

## 2017-12-10 MED ORDER — BENAZEPRIL HCL 20 MG PO TABS
20.0000 mg | ORAL_TABLET | Freq: Once | ORAL | Status: AC
  Administered 2017-12-10: 20 mg via ORAL
  Filled 2017-12-10: qty 1

## 2017-12-10 MED ORDER — HYDRALAZINE HCL 20 MG/ML IJ SOLN: 5.0000 mg | Freq: Four times a day (QID) | INTRAMUSCULAR | Status: DC | PRN

## 2017-12-10 MED ORDER — GABAPENTIN 600 MG PO TABS
600.0000 mg | ORAL_TABLET | Freq: Four times a day (QID) | ORAL | Status: DC
  Administered 2017-12-10 – 2017-12-11 (×6): 600 mg via ORAL
  Filled 2017-12-10 (×6): qty 1

## 2017-12-10 NOTE — Progress Notes (Signed)
*  PRELIMINARY RESULTS* Echocardiogram 2D Echocardiogram has been performed.  [place] 12/10/2017, 9:17 PM

## 2017-12-10 NOTE — Care Management Obs Status (Signed)
[place] NOTIFICATION   Patient Details  Name: Kristen Barron MRN: [id number] Date of Birth: [birth year]   Medicare Observation Status Notification Given:  Yes    Beverly Sessions, RN 12/10/2017, 3:15 PM

## 2017-12-10 NOTE — Progress Notes (Signed)
[place] [facility] NAME: Kristen Barron    MR#:  [id number]  DATE OF BIRTH:  23-Jun-1929  SUBJECTIVE:  CHIEF COMPLAINT: Patient is reporting lower leg cramps more on the right side than on the left venous Dopplers are negative for blood clots.  Daughter at bedside.  REVIEW OF SYSTEMS:  CONSTITUTIONAL: No fever, fatigue or weakness.  EYES: No blurred or double vision.  EARS, NOSE, AND THROAT: No tinnitus or ear pain.  RESPIRATORY: No cough, shortness of breath, wheezing or hemoptysis.  CARDIOVASCULAR: No chest pain, orthopnea, edema.  GASTROINTESTINAL: No nausea, vomiting, diarrhea or abdominal pain.  GENITOURINARY: No dysuria, hematuria.  ENDOCRINE: No polyuria, nocturia,  HEMATOLOGY: No anemia, easy bruising or bleeding SKIN: No rash or lesion. MUSCULOSKELETAL: No joint pain or arthritis.   NEUROLOGIC: No tingling, numbness, weakness.  PSYCHIATRY: No anxiety or depression.   DRUG ALLERGIES:  No Known Allergies  VITALS:  Blood pressure (!) 151/60, pulse 72, temperature 99 F (37.2 C), temperature source Oral, resp. rate 16, height 5\' 1"  (1.549 m), weight 79.2 kg (174 lb 11.2 oz), SpO2 95 %.  PHYSICAL EXAMINATION:  GENERAL:  84-year-old patient lying in the bed with no acute distress.  EYES: Pupils equal, round, reactive to light and accommodation. No scleral icterus. Extraocular muscles intact.  HEENT: Head atraumatic, normocephalic. Oropharynx and nasopharynx clear.  NECK:  Supple, no jugular venous distention. No thyroid enlargement, no tenderness.  LUNGS: Normal breath sounds bilaterally, no wheezing, rales,rhonchi or crepitation. No use of accessory muscles of respiration.  CARDIOVASCULAR: S1, S2 normal. No murmurs, rubs, or gallops.  ABDOMEN: Soft, nontender, nondistended. Bowel sounds present. No organomegaly or mass.  EXTREMITIES: Bilateral lower extremity tenderness is present which is chronic in nature, no erythema  no pedal edema, cyanosis, or clubbing.  NEUROLOGIC: Cranial nerves II through XII are intact. Muscle strength at baseline in all extremities. Sensation intact. Gait not checked.  PSYCHIATRIC: The patient is alert and oriented x 3.  SKIN: No obvious rash, lesion, or ulcer.    LABORATORY PANEL:   CBC Recent Labs  Lab 12/09/17 1742  WBC 5.8  HGB 9.2*  HCT 27.6*  PLT 295   ------------------------------------------------------------------------------------------------------------------  Chemistries  Recent Labs  Lab 12/09/17 1742  NA 135  K 4.2  CL 104  CO2 22  GLUCOSE 130*  BUN 15  CREATININE 1.10*  CALCIUM 8.2*  MG 1.7  AST 16  ALT 9*  ALKPHOS 64  BILITOT 0.3   ------------------------------------------------------------------------------------------------------------------  Cardiac Enzymes Recent Labs  Lab 12/09/17 1924  TROPONINI <0.03   ------------------------------------------------------------------------------------------------------------------  RADIOLOGY:  Dg Chest 2 View  Result Date: 12/09/2017 CLINICAL DATA:  Patient with lower extremity edema. EXAM: CHEST - 2 VIEW COMPARISON:  Chest radiograph 06/07/2017. FINDINGS: Left anterior chest wall Port-A-Cath is present with tip terminating in the central left brachiocephalic vein. Stable cardiac and mediastinal contours. Aortic atherosclerosis. Interval development of diffuse bilateral interstitial pulmonary opacities. Small right pleural effusion. Thoracic spine degenerative changes. No pneumothorax. IMPRESSION: New interstitial opacities bilaterally concerning for pulmonary edema. Small right pleural effusion. Electronically Signed   By: Lovey Newcomer M.D.   On: 12/09/2017 18:18   US Venous Img Lower Bilateral  Result Date: 12/09/2017 CLINICAL DATA:  84-year-old with bilateral lower extremity swelling for the past 4 days EXAM: BILATERAL LOWER EXTREMITY VENOUS DOPPLER ULTRASOUND TECHNIQUE: Gray-scale  sonography with graded compression, as well as color Doppler and duplex ultrasound were performed to evaluate the lower  extremity deep venous systems from the level of the common femoral vein and including the common femoral, femoral, profunda femoral, popliteal and calf veins including the posterior tibial, peroneal and gastrocnemius veins when visible. The superficial great saphenous vein was also interrogated. Spectral Doppler was utilized to evaluate flow at rest and with distal augmentation maneuvers in the common femoral, femoral and popliteal veins. COMPARISON:  None. FINDINGS: RIGHT LOWER EXTREMITY Common Femoral Vein: No evidence of thrombus. Normal compressibility, respiratory phasicity and response to augmentation. Increased pulsatility of the venous waveform. Saphenofemoral Junction: No evidence of thrombus. Normal compressibility and flow on color Doppler imaging. Profunda Femoral Vein: No evidence of thrombus. Normal compressibility and flow on color Doppler imaging. Femoral Vein: No evidence of thrombus. Normal compressibility, respiratory phasicity and response to augmentation. Popliteal Vein: No evidence of thrombus. Normal compressibility, respiratory phasicity and response to augmentation. Calf Veins: No evidence of thrombus. Normal compressibility and flow on color Doppler imaging. Superficial Great Saphenous Vein: No evidence of thrombus. Normal compressibility. Venous Reflux:  None. Other Findings:  None. LEFT LOWER EXTREMITY Common Femoral Vein: No evidence of thrombus. Normal compressibility, respiratory phasicity and response to augmentation. Increased pulsatility of the venous waveform. Saphenofemoral Junction: No evidence of thrombus. Normal compressibility and flow on color Doppler imaging. Profunda Femoral Vein: No evidence of thrombus. Normal compressibility and flow on color Doppler imaging. Femoral Vein: No evidence of thrombus. Normal compressibility, respiratory phasicity and  response to augmentation. Popliteal Vein: No evidence of thrombus. Normal compressibility, respiratory phasicity and response to augmentation. Calf Veins: No evidence of thrombus. Normal compressibility and flow on color Doppler imaging. Superficial Great Saphenous Vein: No evidence of thrombus. Normal compressibility. Venous Reflux:  None. Other Findings:  None. IMPRESSION: 1. No evidence of deep venous thrombosis in either lower extremity. 2. Symmetric bilateral increased pulsatility of the venous waveforms as can be seen in the setting of elevated right heart pressures. Differential considerations include tricuspid regurgitation, right heart failure, pulmonary arterial hypertension and COPD. Electronically Signed   By: Jacqulynn Cadet M.D.   On: 12/09/2017 20:43    EKG:   Orders placed or performed during the hospital encounter of 12/09/17  . ED EKG  . ED EKG  . EKG 12-Lead  . EKG 12-Lead    ASSESSMENT AND PLAN:   This is an 84-year-old admitted for hypertensive urgency. 1.  Hypertensive urgency: Exacerbating CHF.  Continue metoprolol and benazepril dose increased to 40 mg.  Continue hydralazine and titrate as needed.   Obtain echocardiogram.  Seen by cardiology Dr. Ubaldo Glassing   2.  Pulmonary edema: s/p IV Lasix.  Resume torsemide p.o. in the morning   3.  CKD: Stage III; avoid nephrotoxic agents.  4.  Diabetes mellitus type 2: Continue basal insulin therapy as well as sliding cell insulin while hospitalized  5.  Cerebrovascular disease: History of stroke; continue Plavix  6.  DVT prophylaxis: Lovenox 7.  GI prophylaxis: None      All the records are reviewed and case discussed with Care Management/Social Workerr. Management plans discussed with the patient, family and they are in agreement.  CODE STATUS: fc   TOTAL TIME TAKING CARE OF THIS PATIENT: 35  minutes.   POSSIBLE D/C IN 1-2  DAYS, DEPENDING ON CLINICAL CONDITION.  Note: This dictation was prepared with  Dragon dictation along with smaller phrase technology. Any transcriptional errors that result from this process are unintentional.   Nicholes Mango M.D on 12/10/2017 at 4:43 PM  Between 7am to 6pm -  Pager - [phone] After 6pm go to [email or web address] - password [email or web address] [facility]  [place] [place] [facility]  Office  [phone]  CC: Primary care physician; Denton Lank, MD

## 2017-12-10 NOTE — Progress Notes (Addendum)
Cardiovascular and Pulmonary Nurse Navigator Note  84 year old female with hx of breast cancer s/p mastectomy and chemotherapy, HTN, DM who presented to the ER with SOB and peripheral edema.  Patient reported that she has had worsening peripheral edema and SOB over the last few days.  Patient was found to be hypertensive with SBP > 200.   Active Problem List: 1. Hypertensive urgency exacerbating CHF.  Patient does not have a cardiologist and does not have an echo on file.  This RN contacted Dr. Margaretmary Eddy to place orders for Echo, as Dr. Ubaldo Glassing was requesting echo.  Echo ordered.   2. Pulmonary Edema 3. CKD: Stage III 4. DM Type II 5.  History of Stroke - patient on plavix  CHF Education / Re-Education: Rounded on patient.  Patient lying in bed with HOB elevated 30 degrees.  Husband at bedside trying to make a phone call.  This RN assisted patient's husband with making call.    CHF is not a new diagnosis for patient.  Patient lives at home with her husband and grand-daughter in a mobile home.  Husband and grand-daughter do the cooking.  Patient no longer cooks, but continues to drive.  Patient uses a walker to ambulate and is able to perform all of her ADLs - bathes and feeds herself.   Patient reports that she has working scales, but had stopped weighing herself some time ago.  Patient stated she used to weigh herself daily, but stopped.     Provided patient with "Living Better with Heart Failure" packet. Briefly reviewed definition of heart failure and signs and symptoms of an exacerbation.?Explained to patient that HF is a chronic illness which requires self-assessment / self-management along with help from the cardiologist/PCP.?? ? *Reviewed importance of and reason behind checking weight daily in the AM, after using the bathroom, but before getting dressed. Patient has scales.  ? *Reviewed with patient the following information: *Discussed when to call the Dr= weight gain of >2-3lb overnight of  5lb in a week,  *Discussed yellow zone= call MD: weight gain of >2-3lb overnight of 5lb in a week, increased swelling, increased SOB when lying down, chest discomfort, dizziness, increased fatigue *Red Zone= call 911: struggle to breath, fainting or near fainting, significant chest pain   *Reviewed low sodium diet-provided handout of recommended and not recommended foods.?Patient reports that she does not add salt to her foods nor does her husband cook with salt. Patient reported that she will occasionally eat 1/2 slice of pizza.   As stated above, grand-daughter and husband do the cooking.    *Discussed fluid intake with patient as well. Patient not currently on a fluid restriction, but advised no more than 8-8 ounces glass of fluids per day.? ? *Instructed patient to take medications as prescribed for heart failure. Explained briefly why pt is on the medications (either make you feel better, live longer or keep you out of the hospital) and discussed monitoring and side effects.  ? *Discussed exercise. Patient ambulates with a walker.  Patient informed this RN that she walks around inside their home.    *Smoking Cessation- Patient is a NEVER smoker.? ? *[facility] - Explained the purpose of the HF Clinic. ?Explained to patient the HF Clinic does not replace PCP nor Cardiologist, but is an additional resource to helping patient manage heart failure at home.   New patient appointment scheduled for 12/15/2017 at 12 noon.   Patient agreeable to be followed in [place]  Failure [facility].   ? Again, the 5 Steps to Living Better with Heart Failure were reviewed with patient.  Echo pending:  Explained to patient the purpose of the echo and how the test is performed.   ? Patient thanked me for providing the above information. ? ? Roanna Epley, RN, BSN, [facility]? [place] [facility]  Cardiovascular &?Pulmonary Nurse Navigator  Direct Line: [phone]  Department  Phone #: [phone] Fax: [phone]? Email Address: [email or web address]

## 2017-12-10 NOTE — Consult Note (Signed)
Cardiology Consultation Note    Patient ID: Kristen Barron, MRN: [id number], DOB/AGE: 1928/08/15 84 y.o. Admit date: 12/09/2017   Date of Consult: 12/10/2017 Primary Physician: Denton Lank, MD Primary Cardiologist:    Chief Complaint: Lower extremity edema Reason for Consultation: htn Requesting MD: dr Margaretmary Eddy  HPI: OLANNA PERCIFIELD is a 84 y.o. female with history of breast cancer status post mastectomy and chemotherapy, hypertension, diabetes who presented to the emergency room with shortness of breath and peripheral edema.  She states she has had worsening peripheral edema and shortness of breath over the past few days.  She was markedly hypertensive with systolic blood pressure greater than 200.  She was given Nitropaste and given IV Lasix.  Her blood pressure has improved but still is relatively hypertensive.  She is ruled out for myocardial infarction.  Chest x-ray shows possible pulmonary edema.  Lower extremity ultrasound revealed no DVT.  She currently is on hydralazine 50 mg 3 times daily, benazepril 20 mg daily and metoprolol succinate 100 mg daily.  She is on clopidogrel.  He also is on torsemide at 20 mg daily.  Past Medical History:  Diagnosis Date  . Arthritis   . Breast cancer ([place]) 03/06/2014   right breast with mastectomy and chemo  . Cancer [facility])    Reports lumpectomy for a cyst  . CHF (congestive heart failure) ([place])   . Collagen vascular disease ([place])   . Diabetes ([place])   . DVT (deep venous thrombosis) ([place])   . Hemorrhoids   . Hypertension   . Renal disorder   . Renal insufficiency   . Stroke [facility])    August 2014, but has had strokes prior as well      Surgical History:  Past Surgical History:  Procedure Laterality Date  . ABDOMINAL HYSTERECTOMY     Patient not clear as to why  . BACK SURGERY    . BREAST BIOPSY Right February 15 2014   invasive mammary carcinoma/ER/PR negative, HER 2 positive  . BREAST BIOPSY Right 1999   negative biopsy  . BREAST SURGERY Right  03/06/14   mastectomy  . COLONOSCOPY WITH PROPOFOL N/A 04/15/2016   Procedure: COLONOSCOPY WITH PROPOFOL;  Surgeon: Manya Silvas, MD;  [facility]: [facility] ENDOSCOPY;  Service: Endoscopy;  Laterality: N/A;  . ESOPHAGOGASTRODUODENOSCOPY (EGD) WITH PROPOFOL N/A 04/15/2016   Procedure: ESOPHAGOGASTRODUODENOSCOPY (EGD) WITH PROPOFOL;  Surgeon: Manya Silvas, MD;  Location: [facility] ENDOSCOPY;  Service: Endoscopy;  Laterality: N/A;  . HAND SURGERY Right    Carpel tunnel release in the 1970s  . LUMBAR LAMINECTOMY/DECOMPRESSION MICRODISCECTOMY Bilateral 07/24/2013   Procedure: LUMBAR LAMINECTOMY/DECOMPRESSION MICRODISCECTOMY LUMBAR THREE-FOUR;  Surgeon: Charlie Pitter, MD;  Location: [place] NEURO ORS;  Service: Neurosurgery;  Laterality: Bilateral;  . MASTECTOMY Right 03/06/2014   right br ca     Home Meds: Prior to Admission medications   Medication Sig Start Date End Date Taking? Authorizing Provider  benazepril (LOTENSIN) 20 MG tablet Take 20 mg by mouth daily. 10/15/16  Yes [provider]  clopidogrel (PLAVIX) 75 MG tablet Take 75 mg by mouth daily with breakfast.   Yes [provider]  gabapentin (NEURONTIN) 600 MG tablet Take 600 mg by mouth 4 (four) times daily.  11/25/16  Yes [provider]  Glucosamine 500 MG CAPS Take 500 mg by mouth 3 (three) times daily.   Yes [provider]  hydrALAZINE (APRESOLINE) 50 MG tablet Take 50 mg by mouth 3 (three) times daily.  Yes [provider]  insulin aspart (NOVOLOG) 100 UNIT/ML injection Inject 4 Units into the skin 3 (three) times daily before meals.    Yes [provider]  insulin detemir (LEVEMIR) 100 UNIT/ML injection Inject 18 Units into the skin at bedtime.   Yes [provider]  metoprolol succinate (TOPROL-XL) 100 MG 24 hr tablet Take 100 mg by mouth daily. Take with or immediately following a meal.   Yes [provider]  NORCO 5-325 MG tablet Take 1 tablet by mouth every 6 (six) hours  as needed (back pain).  10/19/17  Yes [provider]  Omega 3 1200 MG CAPS Take 1 capsule by mouth daily.    Yes [provider]  ondansetron (ZOFRAN-ODT) 4 MG disintegrating tablet Take 4 mg by mouth every 8 (eight) hours as needed for nausea or vomiting.   Yes [provider]  oxybutynin (DITROPAN-XL) 5 MG 24 hr tablet Take 5 mg by mouth daily.   Yes [provider]  pantoprazole (PROTONIX) 40 MG tablet Take 40 mg by mouth daily.   Yes [provider]  pravastatin (PRAVACHOL) 80 MG tablet Take 80 mg by mouth every evening.    Yes [provider]  silver sulfADIAZINE (SILVADENE) 1 % cream Apply 1 application topically 2 (two) times daily.   Yes [provider]  torsemide (DEMADEX) 20 MG tablet Take 1 tablet (20 mg total) by mouth daily. Start tomorrow 11/1 06/10/17  Yes Max Sane, MD    Inpatient Medications:  . [START ON 12/11/2017] benazepril  40 mg Oral Daily  . clopidogrel  75 mg Oral Q breakfast  . docusate sodium  100 mg Oral BID  . enoxaparin (LOVENOX) injection  40 mg Subcutaneous Q24H  . gabapentin  600 mg Oral QID  . hydrALAZINE  50 mg Oral TID  . insulin aspart  0-9 Units Subcutaneous TID WC  . insulin detemir  12 Units Subcutaneous QHS  . Melatonin  5 mg Oral QHS  . metoprolol succinate  100 mg Oral Daily  . torsemide  20 mg Oral Daily     Allergies: No Known Allergies  Social History   Socioeconomic History  . Marital status: Divorced    Spouse name: Not on file  . Number of children: 5  . Years of education: 11th  . Highest education level: Not on file  Occupational History  . Occupation: retired    Comment: Worked as a Radio broadcast assistant until [place]  . Financial resource strain: Not on file  . Food insecurity:    Worry: Not on file    Inability: Not on file  . Transportation needs:    Medical: Not on file    Non-medical: Not on file  Tobacco Use  . Smoking status: Never Smoker  .  Smokeless tobacco: Never Used  Substance and Sexual Activity  . Alcohol use: No  . Drug use: No  . Sexual activity: Not on file  Lifestyle  . Physical activity:    Days per week: Not on file    Minutes per session: Not on file  . Stress: Not on file  Relationships  . Social connections:    Talks on phone: Not on file    Gets together: Not on file    Attends religious service: Not on file    Active member of club or organization: Not on file    Attends meetings of clubs or organizations: Not on file    Relationship  status: Not on file  . Intimate partner violence:    Fear of current or ex partner: Not on file    Emotionally abused: Not on file    Physically abused: Not on file    Forced sexual activity: Not on file  Other Topics Concern  . Not on file  Social History Narrative   Lives with granddaughter but is independent and manages her own medications. Does not use a walker or cane.  Has 2 living children and 3 dead children.     Family History  Problem Relation Age of Onset  . CAD Father   . Cancer Sister 62       breast  . Breast cancer Sister 87  . Cancer Brother        kidney  . Cancer Maternal Aunt        breast  . Breast cancer Maternal Aunt 70  . Cancer Other        maternal niece with breast cancer  . Breast cancer Other   . Cancer Sister        pancreatic     Review of Systems: A 12-system review of systems was performed and is negative except as noted in the HPI.  Labs: Recent Labs    12/09/17 1924  TROPONINI <0.03   Lab Results  Component Value Date   WBC 5.8 12/09/2017   HGB 9.2 (L) 12/09/2017   HCT 27.6 (L) 12/09/2017   MCV 87.2 12/09/2017   PLT 295 12/09/2017    Recent Labs  Lab 12/09/17 1742  NA 135  K 4.2  CL 104  CO2 22  BUN 15  CREATININE 1.10*  CALCIUM 8.2*  PROT 6.9  BILITOT 0.3  ALKPHOS 64  ALT 9*  AST 16  GLUCOSE 130*   No results found for: CHOL, HDL, LDLCALC, TRIG No results found for:  DDIMER  Radiology/Studies:  Dg Chest 2 View  Result Date: 12/09/2017 CLINICAL DATA:  Patient with lower extremity edema. EXAM: CHEST - 2 VIEW COMPARISON:  Chest radiograph 06/07/2017. FINDINGS: Left anterior chest wall Port-A-Cath is present with tip terminating in the central left brachiocephalic vein. Stable cardiac and mediastinal contours. Aortic atherosclerosis. Interval development of diffuse bilateral interstitial pulmonary opacities. Small right pleural effusion. Thoracic spine degenerative changes. No pneumothorax. IMPRESSION: New interstitial opacities bilaterally concerning for pulmonary edema. Small right pleural effusion. Electronically Signed   By: Lovey Newcomer M.D.   On: 12/09/2017 18:18   US Venous Img Lower Bilateral  Result Date: 12/09/2017 CLINICAL DATA:  84 year old female with bilateral lower extremity swelling for the past 4 days EXAM: BILATERAL LOWER EXTREMITY VENOUS DOPPLER ULTRASOUND TECHNIQUE: Gray-scale sonography with graded compression, as well as color Doppler and duplex ultrasound were performed to evaluate the lower extremity deep venous systems from the level of the common femoral vein and including the common femoral, femoral, profunda femoral, popliteal and calf veins including the posterior tibial, peroneal and gastrocnemius veins when visible. The superficial great saphenous vein was also interrogated. Spectral Doppler was utilized to evaluate flow at rest and with distal augmentation maneuvers in the common femoral, femoral and popliteal veins. COMPARISON:  None. FINDINGS: RIGHT LOWER EXTREMITY Common Femoral Vein: No evidence of thrombus. Normal compressibility, respiratory phasicity and response to augmentation. Increased pulsatility of the venous waveform. Saphenofemoral Junction: No evidence of thrombus. Normal compressibility and flow on color Doppler imaging. Profunda Femoral Vein: No evidence of thrombus. Normal compressibility and flow on color Doppler imaging.  Femoral Vein: No evidence  of thrombus. Normal compressibility, respiratory phasicity and response to augmentation. Popliteal Vein: No evidence of thrombus. Normal compressibility, respiratory phasicity and response to augmentation. Calf Veins: No evidence of thrombus. Normal compressibility and flow on color Doppler imaging. Superficial Great Saphenous Vein: No evidence of thrombus. Normal compressibility. Venous Reflux:  None. Other Findings:  None. LEFT LOWER EXTREMITY Common Femoral Vein: No evidence of thrombus. Normal compressibility, respiratory phasicity and response to augmentation. Increased pulsatility of the venous waveform. Saphenofemoral Junction: No evidence of thrombus. Normal compressibility and flow on color Doppler imaging. Profunda Femoral Vein: No evidence of thrombus. Normal compressibility and flow on color Doppler imaging. Femoral Vein: No evidence of thrombus. Normal compressibility, respiratory phasicity and response to augmentation. Popliteal Vein: No evidence of thrombus. Normal compressibility, respiratory phasicity and response to augmentation. Calf Veins: No evidence of thrombus. Normal compressibility and flow on color Doppler imaging. Superficial Great Saphenous Vein: No evidence of thrombus. Normal compressibility. Venous Reflux:  None. Other Findings:  None. IMPRESSION: 1. No evidence of deep venous thrombosis in either lower extremity. 2. Symmetric bilateral increased pulsatility of the venous waveforms as can be seen in the setting of elevated right heart pressures. Differential considerations include tricuspid regurgitation, right heart failure, pulmonary arterial hypertension and COPD. Electronically Signed   By: Jacqulynn Cadet M.D.   On: 12/09/2017 20:43    Wt Readings from Last 3 Encounters:  12/10/17 79.2 kg (174 lb 11.2 oz)  08/08/17 85.7 kg (189 lb)  06/07/17 86.2 kg (190 lb 0.6 oz)   EKG: Sinus rhythm with nonischemic changes.  Physical Exam:  Blood pressure  (!) 170/60, pulse 68, temperature 99 F (37.2 C), temperature source Oral, resp. rate 18, height 5\' 1"  (1.549 m), weight 79.2 kg (174 lb 11.2 oz), SpO2 94 %. Body mass index is 33.01 kg/m. General: Well developed, well nourished, in no acute distress. Head: Normocephalic, atraumatic, sclera non-icteric, no xanthomas, nares are without discharge.  Neck: Negative for carotid bruits. JVD not elevated. Lungs: Clear bilaterally to auscultation without wheezes, rales, or rhonchi. Breathing is unlabored. Heart: RRR with S1 S2. No murmurs, rubs, or gallops appreciated. Abdomen: Soft, non-tender, non-distended with normoactive bowel sounds. No hepatomegaly. No rebound/guarding. No obvious abdominal masses. Msk:  Strength and tone appear normal for age. Extremities: No clubbing or cyanosis. No edema.  Distal pedal pulses are 2+ and equal bilaterally. Neuro: Alert and oriented X 3. No facial asymmetry. No focal deficit. Moves all extremities spontaneously. Psych:  Responds to questions appropriately with a normal affect.     Assessment and Plan  84 year old female with history of accelerated hypertension and mild volume overload.  Her blood pressure is improved and she is improving somewhat with diuresis but remains hypertensive.  We will continue with hydralazine 50 3 times daily, ACE inhibitor and metoprolol.  We will continue to carefully diurese following blood pressure, hemodynamics, renal function and symptoms.  Echocardiogram is pending.  Review when available.  Will make further recognitions based the results of this.  Signed, Teodoro Spray MD 12/10/2017, 12:43 PM Pager: [phone]

## 2017-12-11 LAB — BASIC METABOLIC PANEL
Anion gap: 8 (ref 5–15)
BUN: 19 mg/dL (ref 6–20)
CALCIUM: 8.1 mg/dL — AB (ref 8.9–10.3)
CO2: 28 mmol/L (ref 22–32)
CREATININE: 1.37 mg/dL — AB (ref 0.44–1.00)
Chloride: 98 mmol/L — ABNORMAL LOW (ref 101–111)
GFR calc non Af Amer: 34 mL/min — ABNORMAL LOW (ref >60)
GFR, EST AFRICAN AMERICAN: 39 mL/min — AB (ref >60)
Glucose, Bld: 109 mg/dL — ABNORMAL HIGH (ref 65–99)
Potassium: 4.1 mmol/L (ref 3.5–5.1)
SODIUM: 134 mmol/L — AB (ref 135–145)

## 2017-12-11 LAB — ECHOCARDIOGRAM COMPLETE
HEIGHTINCHES: 61 in
Weight: 2795.2 oz

## 2017-12-11 LAB — GLUCOSE, CAPILLARY
Glucose-Capillary: 107 mg/dL — ABNORMAL HIGH (ref 65–99)
Glucose-Capillary: 123 mg/dL — ABNORMAL HIGH (ref 65–99)

## 2017-12-11 MED ORDER — BENAZEPRIL HCL 40 MG PO TABS: 40.0000 mg | ORAL_TABLET | Freq: Every day | ORAL | 0 refills | Status: AC

## 2017-12-11 MED ORDER — METOPROLOL SUCCINATE ER 100 MG PO TB24: 100.0000 mg | ORAL_TABLET | Freq: Every day | ORAL | 0 refills | Status: DC

## 2017-12-11 NOTE — Discharge Instructions (Signed)
Follow-up with primary care physician in a week and follow-up with cardiology Dr. Ubaldo Glassing in a week

## 2017-12-11 NOTE — Progress Notes (Signed)
Patient Name: Kristen Barron Date of Encounter: 12/11/2017  Hospital Problem List     Active Problems:   Hypertensive urgency    Patient Profile     Still somewhat short of breath.  Pressure improved.  155/56 this morning.  Heart rate 65.  Weight is 1.8 L.  Subjective   Still somewhat short of breath but less so.  Inpatient Medications    . benazepril  40 mg Oral Daily  . clopidogrel  75 mg Oral Q breakfast  . docusate sodium  100 mg Oral BID  . enoxaparin (LOVENOX) injection  40 mg Subcutaneous Q24H  . gabapentin  600 mg Oral QID  . hydrALAZINE  50 mg Oral TID  . insulin aspart  0-9 Units Subcutaneous TID WC  . insulin detemir  12 Units Subcutaneous QHS  . Melatonin  5 mg Oral QHS  . metoprolol succinate  100 mg Oral Daily  . torsemide  20 mg Oral Daily    Vital Signs    Vitals:   12/10/17 1328 12/10/17 1930 12/11/17 0400 12/11/17 0732  BP: (!) 151/60 (!) 156/48 (!) 165/60 (!) 155/56  Pulse: 72 68 70 65  Resp: 16 18 18 18   Temp:  98.3 F (36.8 C) (!) 97.5 F (36.4 C) 97.9 F (36.6 C)  TempSrc:  Oral Oral   SpO2: 95% 95% 92% 92%  Weight:   77 kg (169 lb 12.8 oz)   Height:        Intake/Output Summary (Last 24 hours) at 12/11/2017 1002 Last data filed at 12/11/2017 0403 Gross per 24 hour  Intake 480 ml  Output 300 ml  Net 180 ml   Filed Weights   12/09/17 2323 12/10/17 0346 12/11/17 0400  Weight: 79.5 kg (175 lb 4.8 oz) 79.2 kg (174 lb 11.2 oz) 77 kg (169 lb 12.8 oz)    Physical Exam    GEN: Well nourished, well developed, in no acute distress.  HEENT: normal.  Neck: Supple, no JVD, carotid bruits, or masses. Cardiac: RRR, no murmurs, rubs, or gallops. No clubbing, cyanosis, edema.  Radials/DP/PT 2+ and equal bilaterally.  Respiratory:  Respirations regular and unlabored, clear to auscultation bilaterally. GI: Soft, nontender, nondistended, BS + x 4. MS: no deformity or atrophy. Skin: warm and dry, no rash. Neuro:  Strength and sensation are  intact. Psych: Normal affect.  Labs    CBC Recent Labs    12/09/17 1742  WBC 5.8  NEUTROABS 3.8  HGB 9.2*  HCT 27.6*  MCV 87.2  PLT 397   Basic Metabolic Panel Recent Labs    12/09/17 1742 12/11/17 0521  NA 135 134*  K 4.2 4.1  CL 104 98*  CO2 22 28  GLUCOSE 130* 109*  BUN 15 19  CREATININE 1.10* 1.37*  CALCIUM 8.2* 8.1*  MG 1.7  --    Liver Function Tests Recent Labs    12/09/17 1742  AST 16  ALT 9*  ALKPHOS 64  BILITOT 0.3  PROT 6.9  ALBUMIN 3.5   No results for input(s): LIPASE, AMYLASE in the last 72 hours. Cardiac Enzymes Recent Labs    12/09/17 1924  TROPONINI <0.03   BNP Recent Labs    12/09/17 1742  BNP 424.0*   D-Dimer No results for input(s): DDIMER in the last 72 hours. Hemoglobin A1C Recent Labs    12/09/17 1924  HGBA1C 5.7*   Fasting Lipid Panel No results for input(s): CHOL, HDL, LDLCALC, TRIG, CHOLHDL, LDLDIRECT in the last 72  hours. Thyroid Function Tests Recent Labs    12/09/17 1924  TSH 1.382    Telemetry    Normal sinus rhythm  ECG    Sinus rhythm with no ischemia  Radiology    Dg Chest 2 View  Result Date: 12/09/2017 CLINICAL DATA:  Patient with lower extremity edema. EXAM: CHEST - 2 VIEW COMPARISON:  Chest radiograph 06/07/2017. FINDINGS: Left anterior chest wall Port-A-Cath is present with tip terminating in the central left brachiocephalic vein. Stable cardiac and mediastinal contours. Aortic atherosclerosis. Interval development of diffuse bilateral interstitial pulmonary opacities. Small right pleural effusion. Thoracic spine degenerative changes. No pneumothorax. IMPRESSION: New interstitial opacities bilaterally concerning for pulmonary edema. Small right pleural effusion. Electronically Signed   By: Lovey Newcomer M.D.   On: 12/09/2017 18:18   US Venous Img Lower Bilateral  Result Date: 12/09/2017 CLINICAL DATA:  84 year old female with bilateral lower extremity swelling for the past 4 days EXAM: BILATERAL  LOWER EXTREMITY VENOUS DOPPLER ULTRASOUND TECHNIQUE: Gray-scale sonography with graded compression, as well as color Doppler and duplex ultrasound were performed to evaluate the lower extremity deep venous systems from the level of the common femoral vein and including the common femoral, femoral, profunda femoral, popliteal and calf veins including the posterior tibial, peroneal and gastrocnemius veins when visible. The superficial great saphenous vein was also interrogated. Spectral Doppler was utilized to evaluate flow at rest and with distal augmentation maneuvers in the common femoral, femoral and popliteal veins. COMPARISON:  None. FINDINGS: RIGHT LOWER EXTREMITY Common Femoral Vein: No evidence of thrombus. Normal compressibility, respiratory phasicity and response to augmentation. Increased pulsatility of the venous waveform. Saphenofemoral Junction: No evidence of thrombus. Normal compressibility and flow on color Doppler imaging. Profunda Femoral Vein: No evidence of thrombus. Normal compressibility and flow on color Doppler imaging. Femoral Vein: No evidence of thrombus. Normal compressibility, respiratory phasicity and response to augmentation. Popliteal Vein: No evidence of thrombus. Normal compressibility, respiratory phasicity and response to augmentation. Calf Veins: No evidence of thrombus. Normal compressibility and flow on color Doppler imaging. Superficial Great Saphenous Vein: No evidence of thrombus. Normal compressibility. Venous Reflux:  None. Other Findings:  None. LEFT LOWER EXTREMITY Common Femoral Vein: No evidence of thrombus. Normal compressibility, respiratory phasicity and response to augmentation. Increased pulsatility of the venous waveform. Saphenofemoral Junction: No evidence of thrombus. Normal compressibility and flow on color Doppler imaging. Profunda Femoral Vein: No evidence of thrombus. Normal compressibility and flow on color Doppler imaging. Femoral Vein: No evidence of  thrombus. Normal compressibility, respiratory phasicity and response to augmentation. Popliteal Vein: No evidence of thrombus. Normal compressibility, respiratory phasicity and response to augmentation. Calf Veins: No evidence of thrombus. Normal compressibility and flow on color Doppler imaging. Superficial Great Saphenous Vein: No evidence of thrombus. Normal compressibility. Venous Reflux:  None. Other Findings:  None. IMPRESSION: 1. No evidence of deep venous thrombosis in either lower extremity. 2. Symmetric bilateral increased pulsatility of the venous waveforms as can be seen in the setting of elevated right heart pressures. Differential considerations include tricuspid regurgitation, right heart failure, pulmonary arterial hypertension and COPD. Electronically Signed   By: Jacqulynn Cadet M.D.   On: 12/09/2017 20:43    Assessment & Plan    Hypertension-blood pressure is improved with current medications including benazepril 40 mg daily, hydralazine 50 mg 3 times daily, metoprolol succinate 100 mg daily.  Would continue with this and low-sodium diet.  Torsemide 20 mg daily is also improving her symptoms.  Would continue with his ambulate and  consider discharge if stable.  Follow-up as an outpatient.  Signed, Javier Docker Gaelle Adriance MD 12/11/2017, 10:02 AM  Pager: [phone]

## 2017-12-11 NOTE — Discharge Summary (Signed)
[facility] [place] NAME: Kristen Barron    MR#:  [id number]  DATE OF BIRTH:  [birth year]  DATE OF ADMISSION:  12/09/2017 ADMITTING PHYSICIAN: Harrie Foreman, MD  DATE OF DISCHARGE:  12/11/17  PRIMARY CARE PHYSICIAN: Denton Lank, MD    ADMISSION DIAGNOSIS:  Acute on chronic congestive heart failure, unspecified heart failure type ([place]) [I50.9]  DISCHARGE DIAGNOSIS:  Active Problems:   Hypertensive urgency   SECONDARY DIAGNOSIS:   Past Medical History:  Diagnosis Date  . Arthritis   . Breast cancer ([place]) 03/06/2014   right breast with mastectomy and chemo  . Cancer [facility])    Reports lumpectomy for a cyst  . CHF (congestive heart failure) ([place])   . Collagen vascular disease ([place])   . Diabetes ([place])   . DVT (deep venous thrombosis) ([place])   . Hemorrhoids   . Hypertension   . Renal disorder   . Renal insufficiency   . Stroke [facility])    August 2014, but has had strokes prior as well    HOSPITAL COURSE:  HPI: Patient with past medical history of breast cancer status post mastectomy and chemotherapy, hypertension and diabetes presents to the emergency department due to leg swelling.  It became immediately apparent upon arrival to the patient's main concern was shortness of breath.  She admits to gradually worsening dyspnea over the last few days.  She denies chest pain, nausea or diaphoresis.  Patient's blood pressure was found to be greater than 324 systolic.  Nitropaste was applied to her chest and the patient was given Lasix 40 mg IV prior to the emergency department staff call the hospitalist service for admission      1. Hypertensive urgency: Exacerbating CHF. Continue metoprolol 100 mg once daily , benazepril dose increased to 40 mg.  Continue hydralazine  50 mg 3 times a day and titrate as needed.  echocardiogram.   With ejection fraction 50 to 55%.  Cavity size was mildly dilated Seen by cardiology Dr. Ubaldo Glassing ,  outpatient follow-up with cardiology  2. Pulmonary edema: s/p IV Lasix. Resume torsemide p.o. in the morning   3. CKD: Stage III; avoid nephrotoxic agents.  4. Diabetes mellitus type 2: Continue basal insulin therapy as well as sliding cell insulin while hospitalized  5. Cerebrovascular disease: History of stroke; continue Plavix  6. DVT prophylaxis: Lovenox 7. GI prophylaxis: None     DISCHARGE CONDITIONS:   stable  CONSULTS OBTAINED:  Treatment Team:  Teodoro Spray, MD   PROCEDURES  None   DRUG ALLERGIES:  No Known Allergies  DISCHARGE MEDICATIONS:   Allergies as of 12/11/2017   No Known Allergies     Medication List    TAKE these medications   benazepril 40 MG tablet Commonly known as:  LOTENSIN Take 1 tablet (40 mg total) by mouth daily. Start taking on:  12/12/2017 What changed:    medication strength  how much to take   clopidogrel 75 MG tablet Commonly known as:  PLAVIX Take 75 mg by mouth daily with breakfast.   gabapentin 600 MG tablet Commonly known as:  NEURONTIN Take 600 mg by mouth 4 (four) times daily.   Glucosamine 500 MG Caps Take 500 mg by mouth 3 (three) times daily.   hydrALAZINE 50 MG tablet Commonly known as:  APRESOLINE Take 50 mg by mouth 3 (three) times daily.   insulin aspart 100 UNIT/ML injection Commonly known as:  novoLOG Inject 4 Units into  the skin 3 (three) times daily before meals.   insulin detemir 100 UNIT/ML injection Commonly known as:  LEVEMIR Inject 18 Units into the skin at bedtime.   metoprolol succinate 100 MG 24 hr tablet Commonly known as:  TOPROL-XL Take 1 tablet (100 mg total) by mouth daily. Take with or immediately following a meal. Start taking on:  12/12/2017   NORCO 5-325 MG tablet Generic drug:  HYDROcodone-acetaminophen Take 1 tablet by mouth every 6 (six) hours as needed (back pain).   Omega 3 1200 MG Caps Take 1 capsule by mouth daily.   ondansetron 4 MG disintegrating  tablet Commonly known as:  ZOFRAN-ODT Take 4 mg by mouth every 8 (eight) hours as needed for nausea or vomiting.   oxybutynin 5 MG 24 hr tablet Commonly known as:  DITROPAN-XL Take 5 mg by mouth daily.   pantoprazole 40 MG tablet Commonly known as:  PROTONIX Take 40 mg by mouth daily.   pravastatin 80 MG tablet Commonly known as:  PRAVACHOL Take 80 mg by mouth every evening.   silver sulfADIAZINE 1 % cream Commonly known as:  SILVADENE Apply 1 application topically 2 (two) times daily.   torsemide 20 MG tablet Commonly known as:  DEMADEX Take 1 tablet (20 mg total) by mouth daily. Start tomorrow 11/1        DISCHARGE INSTRUCTIONS:   Follow-up with primary care physician in a week and follow-up with cardiology Dr. Ubaldo Glassing in a week  DIET:  Cardiac diet and Diabetic diet  DISCHARGE CONDITION:  Stable  ACTIVITY:  Activity as tolerated  OXYGEN:  Home Oxygen: No.   Oxygen Delivery: room air  DISCHARGE LOCATION:  home   If you experience worsening of your admission symptoms, develop shortness of breath, life threatening emergency, suicidal or homicidal thoughts you must seek medical attention immediately by calling 911 or calling your MD immediately  if symptoms less severe.  You Must read complete instructions/literature along with all the possible adverse reactions/side effects for all the Medicines you take and that have been prescribed to you. Take any new Medicines after you have completely understood and accpet all the possible adverse reactions/side effects.   Please note  You were cared for by a hospitalist during your hospital stay. If you have any questions about your discharge medications or the care you received while you were in the hospital after you are discharged, you can call the unit and asked to speak with the hospitalist on call if the hospitalist that took care of you is not available. Once you are discharged, your primary care physician will handle  any further medical issues. Please note that NO REFILLS for any discharge medications will be authorized once you are discharged, as it is imperative that you return to your primary care physician (or establish a relationship with a primary care physician if you do not have one) for your aftercare needs so that they can reassess your need for medications and monitor your lab values.     Today  Chief Complaint  Patient presents with  . Leg Swelling   Patient is doing fine.  Resting comfortably.  Denies any headache or blurry vision.  Wants to go home.  Okay to discharge patient from cardiology stand point  ROS:  CONSTITUTIONAL: Denies fevers, chills. Denies any fatigue, weakness.  EYES: Denies blurry vision, double vision, eye pain. EARS, NOSE, THROAT: Denies tinnitus, ear pain, hearing loss. RESPIRATORY: Denies cough, wheeze, shortness of breath.  CARDIOVASCULAR: Denies chest pain,  palpitations, edema.  GASTROINTESTINAL: Denies nausea, vomiting, diarrhea, abdominal pain. Denies bright red blood per rectum. GENITOURINARY: Denies dysuria, hematuria. ENDOCRINE: Denies nocturia or thyroid problems. HEMATOLOGIC AND LYMPHATIC: Denies easy bruising or bleeding. SKIN: Denies rash or lesion. MUSCULOSKELETAL: Denies pain in neck, back, shoulder, knees, hips or arthritic symptoms.  NEUROLOGIC: Denies paralysis, paresthesias.  PSYCHIATRIC: Denies anxiety or depressive symptoms.   VITAL SIGNS:  Blood pressure (!) 155/56, pulse 65, temperature 97.9 F (36.6 C), resp. rate 18, height 5\' 1"  (1.549 m), weight 77 kg (169 lb 12.8 oz), SpO2 92 %.  I/O:    Intake/Output Summary (Last 24 hours) at 12/11/2017 1147 Last data filed at 12/11/2017 1014 Gross per 24 hour  Intake 480 ml  Output 300 ml  Net 180 ml    PHYSICAL EXAMINATION:  GENERAL:  84 y.o.-year-old patient lying in the bed with no acute distress.  EYES: Pupils equal, round, reactive to light and accommodation. No scleral icterus.  Extraocular muscles intact.  HEENT: Head atraumatic, normocephalic. Oropharynx and nasopharynx clear.  NECK:  Supple, no jugular venous distention. No thyroid enlargement, no tenderness.  LUNGS: Normal breath sounds bilaterally, no wheezing, rales,rhonchi or crepitation. No use of accessory muscles of respiration.  CARDIOVASCULAR: S1, S2 normal. No murmurs, rubs, or gallops.  ABDOMEN: Soft, non-tender, non-distended. Bowel sounds present. No organomegaly or mass.  EXTREMITIES: No pedal edema, cyanosis, or clubbing.  NEUROLOGIC: Cranial nerves II through XII are intact. Muscle strength 5/5 in all extremities. Sensation intact. Gait not checked.  PSYCHIATRIC: The patient is alert and oriented x 3.  SKIN: No obvious rash, lesion, or ulcer.   DATA REVIEW:   CBC Recent Labs  Lab 12/09/17 1742  WBC 5.8  HGB 9.2*  HCT 27.6*  PLT 295    Chemistries  Recent Labs  Lab 12/09/17 1742 12/11/17 0521  NA 135 134*  K 4.2 4.1  CL 104 98*  CO2 22 28  GLUCOSE 130* 109*  BUN 15 19  CREATININE 1.10* 1.37*  CALCIUM 8.2* 8.1*  MG 1.7  --   AST 16  --   ALT 9*  --   ALKPHOS 64  --   BILITOT 0.3  --     Cardiac Enzymes Recent Labs  Lab 12/09/17 1924  TROPONINI <0.03    Microbiology Results  Results for orders placed or performed in visit on 12/13/14  Urine culture     Status: None   Collection Time: 12/13/14 11:26 AM  Result Value Ref Range Status   Specimen Description URINE, CLEAN CATCH  Final   Special Requests NONE  Final   Report Status 12/22/2014 FINAL  Final    RADIOLOGY:  Dg Chest 2 View  Result Date: 12/09/2017 CLINICAL DATA:  Patient with lower extremity edema. EXAM: CHEST - 2 VIEW COMPARISON:  Chest radiograph 06/07/2017. FINDINGS: Left anterior chest wall Port-A-Cath is present with tip terminating in the central left brachiocephalic vein. Stable cardiac and mediastinal contours. Aortic atherosclerosis. Interval development of diffuse bilateral interstitial pulmonary  opacities. Small right pleural effusion. Thoracic spine degenerative changes. No pneumothorax. IMPRESSION: New interstitial opacities bilaterally concerning for pulmonary edema. Small right pleural effusion. Electronically Signed   By: Lovey Newcomer M.D.   On: 12/09/2017 18:18   US Venous Img Lower Bilateral  Result Date: 12/09/2017 CLINICAL DATA:  84 year old female with bilateral lower extremity swelling for the past 4 days EXAM: BILATERAL LOWER EXTREMITY VENOUS DOPPLER ULTRASOUND TECHNIQUE: Gray-scale sonography with graded compression, as well as color Doppler and duplex ultrasound  were performed to evaluate the lower extremity deep venous systems from the level of the common femoral vein and including the common femoral, femoral, profunda femoral, popliteal and calf veins including the posterior tibial, peroneal and gastrocnemius veins when visible. The superficial great saphenous vein was also interrogated. Spectral Doppler was utilized to evaluate flow at rest and with distal augmentation maneuvers in the common femoral, femoral and popliteal veins. COMPARISON:  None. FINDINGS: RIGHT LOWER EXTREMITY Common Femoral Vein: No evidence of thrombus. Normal compressibility, respiratory phasicity and response to augmentation. Increased pulsatility of the venous waveform. Saphenofemoral Junction: No evidence of thrombus. Normal compressibility and flow on color Doppler imaging. Profunda Femoral Vein: No evidence of thrombus. Normal compressibility and flow on color Doppler imaging. Femoral Vein: No evidence of thrombus. Normal compressibility, respiratory phasicity and response to augmentation. Popliteal Vein: No evidence of thrombus. Normal compressibility, respiratory phasicity and response to augmentation. Calf Veins: No evidence of thrombus. Normal compressibility and flow on color Doppler imaging. Superficial Great Saphenous Vein: No evidence of thrombus. Normal compressibility. Venous Reflux:  None. Other  Findings:  None. LEFT LOWER EXTREMITY Common Femoral Vein: No evidence of thrombus. Normal compressibility, respiratory phasicity and response to augmentation. Increased pulsatility of the venous waveform. Saphenofemoral Junction: No evidence of thrombus. Normal compressibility and flow on color Doppler imaging. Profunda Femoral Vein: No evidence of thrombus. Normal compressibility and flow on color Doppler imaging. Femoral Vein: No evidence of thrombus. Normal compressibility, respiratory phasicity and response to augmentation. Popliteal Vein: No evidence of thrombus. Normal compressibility, respiratory phasicity and response to augmentation. Calf Veins: No evidence of thrombus. Normal compressibility and flow on color Doppler imaging. Superficial Great Saphenous Vein: No evidence of thrombus. Normal compressibility. Venous Reflux:  None. Other Findings:  None. IMPRESSION: 1. No evidence of deep venous thrombosis in either lower extremity. 2. Symmetric bilateral increased pulsatility of the venous waveforms as can be seen in the setting of elevated right heart pressures. Differential considerations include tricuspid regurgitation, right heart failure, pulmonary arterial hypertension and COPD. Electronically Signed   By: Jacqulynn Cadet M.D.   On: 12/09/2017 20:43    EKG:   Orders placed or performed during the hospital encounter of 12/09/17  . ED EKG  . ED EKG  . EKG 12-Lead  . EKG 12-Lead      Management plans discussed with the patient, family and they are in agreement.  CODE STATUS:     Code Status Orders  (From admission, onward)        Start     Ordered   12/09/17 2317  Full code  Continuous     12/09/17 2316    Code Status History    Date Active Date Inactive Code Status Order ID Comments User Context   06/07/2017 1448 06/09/2017 1659 Full Code [id number]  Loletha Grayer, MD ED   11/23/2016 1726 11/25/2016 1842 Full Code [id number]  Bettey Costa, MD Inpatient   04/13/2016 0112  04/16/2016 1749 Full Code [id number]  Harvie Bridge, DO Inpatient   04/09/2016 0544 04/09/2016 1732 Full Code [id number]  Saundra Shelling, MD Inpatient   07/24/2013 1419 07/26/2013 2038 Full Code [id number]  Charlie Pitter, MD Inpatient   07/20/2013 1336 07/24/2013 1419 Full Code [id number]  Othella Boyer, MD Inpatient      TOTAL TIME TAKING CARE OF THIS PATIENT: 41 minutes.   Note: This dictation was prepared with Dragon dictation along with smaller phrase technology. Any transcriptional errors that result from this process are unintentional.   @  MEC@  on 12/11/2017 at 11:47 AM  Between 7am to 6pm - Pager - [phone]  After 6pm go to [email or web address] - password [email or web address] [facility]  [facility]  Office  [phone]  CC: Primary care physician; Denton Lank, MD

## 2017-12-11 NOTE — Progress Notes (Signed)
Pt to be discharged this afternoon. Iv and tele removd. disch instructions  And prescrips given to pt to her understanding. disch via w.c. Accompanied by daughter.

## 2017-12-14 NOTE — Progress Notes (Signed)
Patient ID: Kristen Barron, female    DOB: 1929-07-11, 84 y.o.   MRN: [id number]  HPI  Kristen Barron is a 84 y/o female with a history of breast cancer, DM, HTN, CKD, stroke, DVT and chronic heart failure.   Echo report from 12/10/17 reviewed and showed an EF of 50-55%.  Admitted 12/09/17 due to HTN emergency along with acute on chronic HF. Nitropaste applied and IV lasix given. Cardiology consult obtained. Medications were then adjusted and she was discharged after 2 days.   She presents today for her initial visit with a chief complaint of mild fatigue upon moderate exertion. She describes this as chronic in nature having been present for several years. She has associated shortness of breath, decreased appetite, edema, difficulty sleeping and leg pain along with this. She denies abdominal distention, palpitations, chest pain, dizziness or weight gain.   Past Medical History:  Diagnosis Date  . Arthritis   . Breast cancer ([place]) 03/06/2014   right breast with mastectomy and chemo  . Cancer [facility])    Reports lumpectomy for a cyst  . CHF (congestive heart failure) ([place])   . Collagen vascular disease ([place])   . Diabetes ([place])   . DVT (deep venous thrombosis) ([place])   . Hemorrhoids   . Hypertension   . Renal disorder   . Renal insufficiency   . Stroke [facility])    August 2014, but has had strokes prior as well   Past Surgical History:  Procedure Laterality Date  . ABDOMINAL HYSTERECTOMY     Patient not clear as to why  . BACK SURGERY    . BREAST BIOPSY Right February 15 2014   invasive mammary carcinoma/ER/PR negative, HER 2 positive  . BREAST BIOPSY Right 1999   negative biopsy  . BREAST SURGERY Right 03/06/14   mastectomy  . COLONOSCOPY WITH PROPOFOL N/A 04/15/2016   Procedure: COLONOSCOPY WITH PROPOFOL;  Surgeon: Manya Silvas, MD;  Location[facility] ENDOSCOPY;  Service: Endoscopy;  Laterality: N/A;  . ESOPHAGOGASTRODUODENOSCOPY (EGD) WITH PROPOFOL N/A 04/15/2016   Procedure:  ESOPHAGOGASTRODUODENOSCOPY (EGD) WITH PROPOFOL;  Surgeon: Manya Silvas, MD;  Location: [facility] ENDOSCOPY;  Service: Endoscopy;  Laterality: N/A;  . HAND SURGERY Right    Carpel tunnel release in the 1970s  . LUMBAR LAMINECTOMY/DECOMPRESSION MICRODISCECTOMY Bilateral 07/24/2013   Procedure: LUMBAR LAMINECTOMY/DECOMPRESSION MICRODISCECTOMY LUMBAR THREE-FOUR;  Surgeon: Charlie Pitter, MD;  Location: [place] NEURO ORS;  Service: Neurosurgery;  Laterality: Bilateral;  . MASTECTOMY Right 03/06/2014   right br ca   Family History  Problem Relation Age of Onset  . CAD Father   . Cancer Sister 26       breast  . Breast cancer Sister 84  . Cancer Brother        kidney  . Cancer Maternal Aunt        breast  . Breast cancer Maternal Aunt 70  . Cancer Other        maternal niece with breast cancer  . Breast cancer Other   . Cancer Sister        pancreatic   Social History   Tobacco Use  . Smoking status: Never Smoker  . Smokeless tobacco: Never Used  Substance Use Topics  . Alcohol use: No   No Known Allergies Prior to Admission medications   Medication Sig Start Date End Date Taking? Authorizing Provider  benazepril (LOTENSIN) 40 MG tablet Take 1 tablet (40 mg total) by mouth daily. 12/12/17  Yes Nicholes Mango, MD  clopidogrel (PLAVIX) 75 MG tablet Take 75 mg by mouth daily with breakfast.   Yes [provider]  gabapentin (NEURONTIN) 600 MG tablet Take 600 mg by mouth 4 (four) times daily.  11/25/16  Yes [provider]  Glucosamine 500 MG CAPS Take 500 mg by mouth 3 (three) times daily.   Yes [provider]  hydrALAZINE (APRESOLINE) 50 MG tablet Take 50 mg by mouth 3 (three) times daily.    Yes [provider]  insulin aspart (NOVOLOG) 100 UNIT/ML injection Inject 4 Units into the skin 3 (three) times daily before meals.    Yes [provider]  insulin detemir (LEVEMIR) 100 UNIT/ML injection Inject 18 Units into the skin at bedtime.   Yes [provider]  metoprolol succinate (TOPROL-XL) 100 MG 24 hr tablet Take 1 tablet (100 mg total) by mouth daily. Take with or immediately following a meal. 12/12/17  Yes Gouru, Aruna, MD  NORCO 5-325 MG tablet Take 1 tablet by mouth every 6 (six) hours as needed (back pain).  10/19/17  Yes [provider]  Omega 3 1200 MG CAPS Take 1 capsule by mouth daily.    Yes [provider]  ondansetron (ZOFRAN-ODT) 4 MG disintegrating tablet Take 4 mg by mouth every 8 (eight) hours as needed for nausea or vomiting.   Yes [provider]  oxybutynin (DITROPAN-XL) 5 MG 24 hr tablet Take 5 mg by mouth daily.   Yes [provider]  pantoprazole (PROTONIX) 40 MG tablet Take 40 mg by mouth daily.   Yes [provider]  pravastatin (PRAVACHOL) 80 MG tablet Take 80 mg by mouth every evening.    Yes [provider]  silver sulfADIAZINE (SILVADENE) 1 % cream Apply 1 application topically 2 (two) times daily.   Yes [provider]  torsemide (DEMADEX) 20 MG tablet Take 1 tablet (20 mg total) by mouth daily. Start tomorrow 11/1 06/10/17  Yes Max Sane, MD    Review of Systems  Constitutional: Positive for appetite change (decreased) and fatigue.  HENT: Negative for congestion, postnasal drip and sore throat.   Eyes: Negative.   Respiratory: Positive for shortness of breath. Negative for chest tightness.   Cardiovascular: Positive for leg swelling. Negative for chest pain and palpitations.  Gastrointestinal: Negative for abdominal distention and abdominal pain.  Endocrine: Negative.   Genitourinary: Negative.   Musculoskeletal: Positive for arthralgias (legs). Negative for back pain.  Skin: Negative.   Allergic/Immunologic: Negative.   Neurological: Negative for dizziness and light-headedness.  Hematological: Negative for adenopathy. Bruises/bleeds easily.  Psychiatric/Behavioral: Positive for sleep disturbance (not sleeping well; sleeping on 3  pillows). Negative for dysphoric mood. The patient is not nervous/anxious.     Vitals:   12/15/17 1201  BP: (!) 157/53  Pulse: 65  Resp: 18  SpO2: 97%  Weight: 179 lb 8 oz (81.4 kg)  Height: 5\' 1"  (1.549 m)   Wt Readings from Last 3 Encounters:  12/15/17 179 lb 8 oz (81.4 kg)  12/11/17 169 lb 12.8 oz (77 kg)  08/08/17 189 lb (85.7 kg)   Lab Results  Component Value Date   CREATININE 1.37 (H) 12/11/2017   CREATININE 1.10 (H) 12/09/2017   CREATININE 1.43 (H) 08/08/2017    Physical Exam  Constitutional: She is oriented to person, place, and time. She appears well-developed and well-nourished.  HENT:  Head: Normocephalic and atraumatic.  Neck: Normal range of motion. Neck supple. No JVD present.  Cardiovascular: Normal rate and regular rhythm.  Pulmonary/Chest: Effort normal. No respiratory distress. She has no wheezes. She has no rales.  Abdominal: Soft. She exhibits no distension.  Musculoskeletal:       Right lower leg: She exhibits edema (1+ pitting edema).       Left lower leg: She exhibits edema (1+ pitting edema).  Neurological: She is alert and oriented to person, place, and time.  Skin: Skin is warm and dry.  Psychiatric: She has a normal mood and affect. Her behavior is normal.  Nursing note and vitals reviewed.  Assessment & Plan:  1: Chronic heart failure with preserved ejection fraction- - NYHA class II - minimally fluid overloaded with pedal edema - weighing daily and she was instructed to call for an overnight weight gain of >2 pounds or a weekly weight gain of >5 pounds - not adding salt but says that she hasn't been reading food labels much. Reviewed the importance of reading food labels so that she can closely follow a 2000mg  sodium diet. Written dietary information was given to her about this - BNP 12/09/17 was 424.0  2: HTN- - BP mildly elevated today - continue to monitor - sees PCP Posey Pronto) tomorrow - BMP 12/11/17 reviewed and showed sodium 134,  potassium 4.1 and GFR 34  3: Diabetes-  - fasting glucose at home today was 117 - A1c 12/09/17 was 5.7%  4: Lymphedema- - stage 2 - limited in exercise due to instability and use of walker - encouraged her to elevate her legs when sitting for long periods of time - encouraged her to get compression socks and put them on every morning with removal at bedtime - discussed compression boots if edema persists.  Medication list was reviewed.  Return in 5 weeks or sooner for any questions/problems before then.

## 2017-12-15 ENCOUNTER — Encounter: Payer: Self-pay | Admitting: Family

## 2017-12-15 ENCOUNTER — Ambulatory Visit: Payer: Medicare HMO | Attending: Family | Admitting: Family

## 2017-12-15 VITALS — BP 157/53 | HR 65 | Resp 18 | Ht 61.0 in | Wt 179.5 lb

## 2017-12-15 DIAGNOSIS — Z794 Long term (current) use of insulin: Secondary | ICD-10-CM | POA: Insufficient documentation

## 2017-12-15 DIAGNOSIS — I890 Lymphedema, not elsewhere classified: Secondary | ICD-10-CM | POA: Insufficient documentation

## 2017-12-15 DIAGNOSIS — Z7902 Long term (current) use of antithrombotics/antiplatelets: Secondary | ICD-10-CM | POA: Insufficient documentation

## 2017-12-15 DIAGNOSIS — E1122 Type 2 diabetes mellitus with diabetic chronic kidney disease: Secondary | ICD-10-CM | POA: Insufficient documentation

## 2017-12-15 DIAGNOSIS — I5032 Chronic diastolic (congestive) heart failure: Secondary | ICD-10-CM | POA: Diagnosis present

## 2017-12-15 DIAGNOSIS — N189 Chronic kidney disease, unspecified: Secondary | ICD-10-CM | POA: Diagnosis not present

## 2017-12-15 DIAGNOSIS — I10 Essential (primary) hypertension: Secondary | ICD-10-CM

## 2017-12-15 DIAGNOSIS — I130 Hypertensive heart and chronic kidney disease with heart failure and stage 1 through stage 4 chronic kidney disease, or unspecified chronic kidney disease: Secondary | ICD-10-CM | POA: Diagnosis not present

## 2017-12-15 DIAGNOSIS — Z79899 Other long term (current) drug therapy: Secondary | ICD-10-CM | POA: Diagnosis not present

## 2017-12-15 DIAGNOSIS — E119 Type 2 diabetes mellitus without complications: Secondary | ICD-10-CM

## 2017-12-15 DIAGNOSIS — Z8673 Personal history of transient ischemic attack (TIA), and cerebral infarction without residual deficits: Secondary | ICD-10-CM | POA: Diagnosis not present

## 2017-12-15 DIAGNOSIS — IMO0001 Reserved for inherently not codable concepts without codable children: Secondary | ICD-10-CM

## 2017-12-15 NOTE — Patient Instructions (Signed)
Continue weighing daily and call for an overnight weight gain of > 2 pounds or a weekly weight gain of >5 pounds. 

## 2018-01-19 ENCOUNTER — Ambulatory Visit: Payer: Medicare HMO | Attending: Family | Admitting: Family

## 2018-01-19 ENCOUNTER — Other Ambulatory Visit: Payer: Self-pay

## 2018-01-19 ENCOUNTER — Encounter: Payer: Self-pay | Admitting: Family

## 2018-01-19 VITALS — BP 152/68 | HR 66 | Temp 98.0°F | Resp 15 | Ht 61.0 in | Wt 183.6 lb

## 2018-01-19 DIAGNOSIS — E119 Type 2 diabetes mellitus without complications: Secondary | ICD-10-CM

## 2018-01-19 DIAGNOSIS — I509 Heart failure, unspecified: Secondary | ICD-10-CM | POA: Diagnosis present

## 2018-01-19 DIAGNOSIS — M549 Dorsalgia, unspecified: Secondary | ICD-10-CM | POA: Insufficient documentation

## 2018-01-19 DIAGNOSIS — Z86718 Personal history of other venous thrombosis and embolism: Secondary | ICD-10-CM | POA: Diagnosis not present

## 2018-01-19 DIAGNOSIS — W19XXXA Unspecified fall, initial encounter: Secondary | ICD-10-CM | POA: Diagnosis not present

## 2018-01-19 DIAGNOSIS — Z794 Long term (current) use of insulin: Secondary | ICD-10-CM | POA: Insufficient documentation

## 2018-01-19 DIAGNOSIS — E1122 Type 2 diabetes mellitus with diabetic chronic kidney disease: Secondary | ICD-10-CM | POA: Diagnosis not present

## 2018-01-19 DIAGNOSIS — Z853 Personal history of malignant neoplasm of breast: Secondary | ICD-10-CM | POA: Diagnosis not present

## 2018-01-19 DIAGNOSIS — I130 Hypertensive heart and chronic kidney disease with heart failure and stage 1 through stage 4 chronic kidney disease, or unspecified chronic kidney disease: Secondary | ICD-10-CM | POA: Diagnosis not present

## 2018-01-19 DIAGNOSIS — Z7902 Long term (current) use of antithrombotics/antiplatelets: Secondary | ICD-10-CM | POA: Diagnosis not present

## 2018-01-19 DIAGNOSIS — Z8673 Personal history of transient ischemic attack (TIA), and cerebral infarction without residual deficits: Secondary | ICD-10-CM | POA: Insufficient documentation

## 2018-01-19 DIAGNOSIS — N189 Chronic kidney disease, unspecified: Secondary | ICD-10-CM | POA: Diagnosis not present

## 2018-01-19 DIAGNOSIS — I10 Essential (primary) hypertension: Secondary | ICD-10-CM

## 2018-01-19 DIAGNOSIS — Z8249 Family history of ischemic heart disease and other diseases of the circulatory system: Secondary | ICD-10-CM | POA: Diagnosis not present

## 2018-01-19 DIAGNOSIS — I5032 Chronic diastolic (congestive) heart failure: Secondary | ICD-10-CM | POA: Diagnosis not present

## 2018-01-19 DIAGNOSIS — M545 Low back pain, unspecified: Secondary | ICD-10-CM

## 2018-01-19 DIAGNOSIS — I890 Lymphedema, not elsewhere classified: Secondary | ICD-10-CM | POA: Diagnosis not present

## 2018-01-19 DIAGNOSIS — Z800 Family history of malignant neoplasm of digestive organs: Secondary | ICD-10-CM | POA: Insufficient documentation

## 2018-01-19 DIAGNOSIS — Z803 Family history of malignant neoplasm of breast: Secondary | ICD-10-CM | POA: Diagnosis not present

## 2018-01-19 DIAGNOSIS — IMO0001 Reserved for inherently not codable concepts without codable children: Secondary | ICD-10-CM

## 2018-01-19 DIAGNOSIS — Z9011 Acquired absence of right breast and nipple: Secondary | ICD-10-CM | POA: Insufficient documentation

## 2018-01-19 DIAGNOSIS — Z90710 Acquired absence of both cervix and uterus: Secondary | ICD-10-CM | POA: Insufficient documentation

## 2018-01-19 DIAGNOSIS — Z79899 Other long term (current) drug therapy: Secondary | ICD-10-CM | POA: Diagnosis not present

## 2018-01-19 NOTE — Patient Instructions (Addendum)
Continue weighing daily and call for an overnight weight gain of > 2 pounds or a weekly weight gain of >5 pounds.  Get a pair of compression socks and wear them daily with removal at bedtime.

## 2018-01-19 NOTE — Progress Notes (Signed)
Patient ID: Kristen Barron, female    DOB: [birth year], 84 y.o.   MRN: [id number]  HPI  [name] is a 84 y/o female with a history of breast cancer, DM, HTN, CKD, stroke, DVT and chronic heart failure.   Echo report from 12/10/17 reviewed and showed an EF of 50-55%.  Admitted 12/09/17 due to HTN emergency along with acute on chronic HF. Nitropaste applied and IV lasix given. Cardiology consult obtained. Medications were then adjusted and she was discharged after 2 days.   She presents today for a follow-up visit with a chief complaint of mild shortness of breath upon moderate exertion. She describes this as chronic in nature having been present for several years. She has associated fatigue, urinary frequency , pedal edema, back pain and difficulty sleeping (due to pain) along with this. She denies any abdominal distention, palpitations, chest pain, dizziness or weight gain. Fell 3 days ago on her back and is having quite a bit of pain. Due to see PCP tomorrow.   Past Medical History:  Diagnosis Date  . Arthritis   . Breast cancer ([place]) 03/06/2014   right breast with mastectomy and chemo  . Cancer [facility])    Reports lumpectomy for a cyst  . CHF (congestive heart failure) ([place])   . Collagen vascular disease ([place])   . Diabetes ([place])   . DVT (deep venous thrombosis) ([place])   . Hemorrhoids   . Hypertension   . Renal disorder   . Renal insufficiency   . Stroke [facility])    August 2014, but has had strokes prior as well   Past Surgical History:  Procedure Laterality Date  . ABDOMINAL HYSTERECTOMY     Patient not clear as to why  . BACK SURGERY    . BREAST BIOPSY Right February 15 2014   invasive mammary carcinoma/ER/PR negative, HER 2 positive  . BREAST BIOPSY Right 1999   negative biopsy  . BREAST SURGERY Right 03/06/14   mastectomy  . COLONOSCOPY WITH PROPOFOL N/A 04/15/2016   Procedure: COLONOSCOPY WITH PROPOFOL;  Surgeon: Manya Silvas, MD;  Location: [facility] ENDOSCOPY;  Service: Endoscopy;   Laterality: N/A;  . ESOPHAGOGASTRODUODENOSCOPY (EGD) WITH PROPOFOL N/A 04/15/2016   Procedure: ESOPHAGOGASTRODUODENOSCOPY (EGD) WITH PROPOFOL;  Surgeon: Manya Silvas, MD;  Location: [facility] ENDOSCOPY;  Service: Endoscopy;  Laterality: N/A;  . HAND SURGERY Right    Carpel tunnel release in the 1970s  . LUMBAR LAMINECTOMY/DECOMPRESSION MICRODISCECTOMY Bilateral 07/24/2013   Procedure: LUMBAR LAMINECTOMY/DECOMPRESSION MICRODISCECTOMY LUMBAR THREE-FOUR;  Surgeon: Charlie Pitter, MD;  Location: [place] NEURO ORS;  Service: Neurosurgery;  Laterality: Bilateral;  . MASTECTOMY Right 03/06/2014   right br ca   Family History  Problem Relation Age of Onset  . CAD Father   . Cancer Sister 20       breast  . Breast cancer Sister 57  . Cancer Brother        kidney  . Cancer Maternal Aunt        breast  . Breast cancer Maternal Aunt 70  . Cancer Other        maternal niece with breast cancer  . Breast cancer Other   . Cancer Sister        pancreatic   Social History   Tobacco Use  . Smoking status: Never Smoker  . Smokeless tobacco: Never Used  Substance Use Topics  . Alcohol use: No   No Known Allergies  Prior to Admission medications   Medication Sig Start  Date End Date Taking? Authorizing Provider  benazepril (LOTENSIN) 40 MG tablet Take 1 tablet (40 mg total) by mouth daily. 12/12/17  Yes Gouru, Illene Silver, MD  clopidogrel (PLAVIX) 75 MG tablet Take 75 mg by mouth daily with breakfast.   Yes [provider]  gabapentin (NEURONTIN) 600 MG tablet Take 600 mg by mouth 4 (four) times daily.  11/25/16  Yes [provider]  Glucosamine 500 MG CAPS Take 500 mg by mouth 3 (three) times daily.   Yes [provider]  hydrALAZINE (APRESOLINE) 50 MG tablet Take 50 mg by mouth 3 (three) times daily.    Yes [provider]  insulin aspart (NOVOLOG) 100 UNIT/ML injection Inject 4 Units into the skin 3 (three) times daily before meals. Patient is only taking with biggest meal    Yes [provider]  insulin detemir (LEVEMIR) 100 UNIT/ML injection Inject 18 Units into the skin at bedtime.   Yes [provider]  metoprolol succinate (TOPROL-XL) 100 MG 24 hr tablet Take 1 tablet (100 mg total) by mouth daily. Take with or immediately following a meal. 12/12/17  Yes Gouru, Aruna, MD  NORCO 5-325 MG tablet Take 1 tablet by mouth every 6 (six) hours as needed (back pain).  10/19/17  Yes [provider]  Omega 3 1200 MG CAPS Take 1 capsule by mouth daily.    Yes [provider]  ondansetron (ZOFRAN-ODT) 4 MG disintegrating tablet Take 4 mg by mouth every 8 (eight) hours as needed for nausea or vomiting.   Yes [provider]  oxybutynin (DITROPAN-XL) 5 MG 24 hr tablet Take 5 mg by mouth daily.   Yes [provider]  pantoprazole (PROTONIX) 40 MG tablet Take 40 mg by mouth daily.   Yes [provider]  pravastatin (PRAVACHOL) 80 MG tablet Take 80 mg by mouth every evening.    Yes [provider]  silver sulfADIAZINE (SILVADENE) 1 % cream Apply 1 application topically 2 (two) times daily.   Yes [provider]  torsemide (DEMADEX) 20 MG tablet Take 1 tablet (20 mg total) by mouth daily. Start tomorrow 11/1 06/10/17  Yes Max Sane, MD    Review of Systems  Constitutional: Positive for appetite change (decreased) and fatigue.  HENT: Negative for congestion, postnasal drip and sore throat.   Eyes: Negative.   Respiratory: Positive for shortness of breath. Negative for chest tightness.   Cardiovascular: Positive for leg swelling. Negative for chest pain and palpitations.  Gastrointestinal: Negative for abdominal distention and abdominal pain.  Endocrine: Negative.   Genitourinary: Positive for frequency. Negative for hematuria.  Musculoskeletal: Positive for arthralgias (legs) and back pain (right lower back).  Skin: Negative.   Allergic/Immunologic: Negative.   Neurological: Negative for dizziness  and light-headedness.  Hematological: Negative for adenopathy. Bruises/bleeds easily.  Psychiatric/Behavioral: Positive for sleep disturbance (not sleeping well; sleeping on 3 pillows). Negative for dysphoric mood. The patient is not nervous/anxious.    Vitals:   01/19/18 1323  BP: (!) 192/98  Pulse: 66  Resp: 15  Temp: 98 F (36.7 C)  TempSrc: Oral  SpO2: 97%  Weight: 183 lb 9.6 oz (83.3 kg)  Height: 5\' 1"  (1.549 m)   Wt Readings from Last 3 Encounters:  01/19/18 183 lb 9.6 oz (83.3 kg)  12/15/17 179 lb 8 oz (81.4 kg)  12/11/17 169 lb 12.8 oz (77 kg)   Lab Results  Component Value Date   CREATININE 1.37 (H) 12/11/2017   CREATININE 1.10 (H) 12/09/2017  CREATININE 1.43 (H) 08/08/2017    Physical Exam  Constitutional: She is oriented to person, place, and time. She appears well-developed and well-nourished.  HENT:  Head: Normocephalic and atraumatic.  Neck: Normal range of motion. Neck supple. No JVD present.  Cardiovascular: Normal rate and regular rhythm.  Pulmonary/Chest: Effort normal. No respiratory distress. She has no wheezes. She has no rales.  Abdominal: Soft. She exhibits no distension.  Musculoskeletal:       Right lower leg: She exhibits edema (1+ pitting edema).       Left lower leg: She exhibits edema (1+ pitting edema).  Neurological: She is alert and oriented to person, place, and time.  Skin: Skin is warm and dry.  Psychiatric: She has a normal mood and affect. Her behavior is normal.  Nursing note and vitals reviewed.  Assessment & Plan:  1: Chronic heart failure with preserved ejection fraction- - NYHA class II - minimally fluid overloaded with pedal edema - weighing daily and she was instructed to call for an overnight weight gain of >2 pounds or a weekly weight gain of >5 pounds - not adding salt but says that she hasn't been reading food labels much. Reviewed the importance of reading food labels so that she can closely follow a 2000mg  sodium  diet.  - BNP 12/09/17 was 424.0  2: HTN- - BP initially elevated but was coming down upon recheck - continue to monitor - sees PCP Posey Pronto) tomorrow - BMP 12/11/17 reviewed and showed sodium 134, potassium 4.1 and GFR 34  3: Diabetes-  - fasting glucose at home today was 117 - A1c 12/09/17 was 5.7%  4: Lymphedema- - stage 2 - limited in exercise due to instability and use of walker - encouraged her to elevate her legs when sitting for long periods of time - encouraged her to get compression socks and put them on every morning with removal at bedtime - discussed compression boots if edema persists.  5: Back pain- - fell 3 days ago because she thinks she tripped over something. Doesn't feel like she got dizzy - sees PCP tomorrow about this  Patient did not bring her medications nor a list. Each medication was verbally reviewed with the patient and she was encouraged to bring the bottles to every visit to confirm accuracy of list.  Return in 4 months or sooner for any questions/problems before then.

## 2018-01-24 ENCOUNTER — Encounter: Payer: Self-pay | Admitting: Gastroenterology

## 2018-01-24 ENCOUNTER — Other Ambulatory Visit: Payer: Self-pay

## 2018-01-24 ENCOUNTER — Ambulatory Visit: Payer: Medicare HMO | Admitting: Gastroenterology

## 2018-01-24 VITALS — BP 161/60 | HR 65 | Ht 61.0 in | Wt 193.2 lb

## 2018-01-24 DIAGNOSIS — K581 Irritable bowel syndrome with constipation: Secondary | ICD-10-CM

## 2018-01-24 NOTE — Progress Notes (Signed)
Jonathon Bellows MD, MRCP(U.K) [address]  [place]  [place],  [address]  Main: [phone]  Fax: [phone]   Gastroenterology Consultation  Referring Provider:     Denton Lank, MD Primary Care Physician:  Denton Lank, MD Primary Gastroenterologist:  Dr. Jonathon Bellows  Reason for Consultation:     Diarrhea,rectal bleeding and abdominal pain .         HPI:   Kristen Barron is a 84 y.o. y/o female referred for consultation & management  by Dr. Denton Lank, MD.    She has been referred for diarrhea, abdominal pain and rectal bleeding . She has CKD and a chronic normocytic anemia.   At this time she says she has no issues. She recently had some antibiotics and says the abdominal pain resolved. No diarrhea at present. No rectal bleeding and no abdominal pain.   She says that she has had chronic abdominal pain for years. She says she gets the pain on and off. Once a month , each episode lasts for 1 day , usually continuous. Lower abdominal , radiates downwards, describes the pain as it causes her some difficulty breathing . Pain better after hydrocodone.   She has a bowel movement - usually every day, some days skips a day , soft in nature, pain better after a good bowel movement  Gained weight .    2017 : colonoscopy showed internal hemorrhoids.  11/2016- CT abdomen - chololithiasis.   CBC Latest Ref Rng & Units 12/09/2017 08/08/2017 06/09/2017  WBC 3.6 - 11.0 K/uL 5.8 5.4 5.0  Hemoglobin 12.0 - 16.0 g/dL 9.2(L) 9.8(L) 9.5(L)  Hematocrit 35.0 - 47.0 % 27.6(L) 29.1(L) 27.9(L)  Platelets 150 - 440 K/uL 295 232 271       Past Medical History:  Diagnosis Date  . Arthritis   . Breast cancer ([place]) 03/06/2014   right breast with mastectomy and chemo  . Cancer [facility])    Reports lumpectomy for a cyst  . CHF (congestive heart failure) ([place])   . Collagen vascular disease ([place])   . Diabetes ([place])   . DVT (deep venous thrombosis) ([place])   . Hemorrhoids   . Hypertension     . Renal disorder   . Renal insufficiency   . Stroke [facility])    August 2014, but has had strokes prior as well    Past Surgical History:  Procedure Laterality Date  . ABDOMINAL HYSTERECTOMY     Patient not clear as to why  . BACK SURGERY    . BREAST BIOPSY Right February 15 2014   invasive mammary carcinoma/ER/PR negative, HER 2 positive  . BREAST BIOPSY Right 1999   negative biopsy  . BREAST SURGERY Right 03/06/14   mastectomy  . COLONOSCOPY WITH PROPOFOL N/A 04/15/2016   Procedure: COLONOSCOPY WITH PROPOFOL;  Surgeon: Manya Silvas, MD;  Location: [facility] ENDOSCOPY;  Service: Endoscopy;  Laterality: N/A;  . ESOPHAGOGASTRODUODENOSCOPY (EGD) WITH PROPOFOL N/A 04/15/2016   Procedure: ESOPHAGOGASTRODUODENOSCOPY (EGD) WITH PROPOFOL;  Surgeon: Manya Silvas, MD;  Location: [facility] ENDOSCOPY;  Service: Endoscopy;  Laterality: N/A;  . HAND SURGERY Right    Carpel tunnel release in the 1970s  . LUMBAR LAMINECTOMY/DECOMPRESSION MICRODISCECTOMY Bilateral 07/24/2013   Procedure: LUMBAR LAMINECTOMY/DECOMPRESSION MICRODISCECTOMY LUMBAR THREE-FOUR;  Surgeon: Charlie Pitter, MD;  Location: [place] NEURO ORS;  Service: Neurosurgery;  Laterality: Bilateral;  . MASTECTOMY Right 03/06/2014   right br ca    Prior to Admission medications   Medication Sig Start Date End  Date Taking? Authorizing Provider  amoxicillin (AMOXIL) 500 MG capsule  01/20/18   [provider]  benazepril (LOTENSIN) 40 MG tablet Take 1 tablet (40 mg total) by mouth daily. 12/12/17   Nicholes Mango, MD  clopidogrel (PLAVIX) 75 MG tablet Take 75 mg by mouth daily with breakfast.    [provider]  gabapentin (NEURONTIN) 600 MG tablet Take 600 mg by mouth 4 (four) times daily.  11/25/16   [provider]  Glucosamine 500 MG CAPS Take 500 mg by mouth 3 (three) times daily.    [provider]  hydrALAZINE (APRESOLINE) 50 MG tablet Take 50 mg by mouth 3 (three) times daily.     [provider]  insulin aspart  (NOVOLOG) 100 UNIT/ML injection Inject 4 Units into the skin 3 (three) times daily before meals. Patient is only taking with biggest meal    [provider]  insulin detemir (LEVEMIR) 100 UNIT/ML injection Inject 18 Units into the skin at bedtime.    [provider]  metoprolol succinate (TOPROL-XL) 100 MG 24 hr tablet Take 1 tablet (100 mg total) by mouth daily. Take with or immediately following a meal. 12/12/17   Gouru, Aruna, MD  NORCO 5-325 MG tablet Take 1 tablet by mouth every 6 (six) hours as needed (back pain).  10/19/17   [provider]  Omega 3 1200 MG CAPS Take 1 capsule by mouth daily.     [provider]  ondansetron (ZOFRAN-ODT) 4 MG disintegrating tablet Take 4 mg by mouth every 8 (eight) hours as needed for nausea or vomiting.    [provider]  oxybutynin (DITROPAN-XL) 5 MG 24 hr tablet Take 5 mg by mouth daily.    [provider]  pantoprazole (PROTONIX) 40 MG tablet Take 40 mg by mouth daily.    [provider]  pravastatin (PRAVACHOL) 80 MG tablet Take 80 mg by mouth every evening.     [provider]  silver sulfADIAZINE (SILVADENE) 1 % cream Apply 1 application topically 2 (two) times daily.    [provider]  torsemide (DEMADEX) 20 MG tablet Take 1 tablet (20 mg total) by mouth daily. Start tomorrow 11/1 06/10/17   Max Sane, MD    Family History  Problem Relation Age of Onset  . CAD Father   . Cancer Sister 36       breast  . Breast cancer Sister 39  . Cancer Brother        kidney  . Cancer Maternal Aunt        breast  . Breast cancer Maternal Aunt 70  . Cancer Other        maternal niece with breast cancer  . Breast cancer Other   . Cancer Sister        pancreatic     Social History   Tobacco Use  . Smoking status: Never Smoker  . Smokeless tobacco: Never Used  Substance Use Topics  . Alcohol use: No  . Drug use: No    Allergies as of 01/24/2018  . (No Known Allergies)     Review of Systems:    All systems reviewed and negative except where noted in HPI.   Physical Exam:  There were no vitals taken for this visit. No LMP recorded. Patient has had a hysterectomy. Psych:  Alert and cooperative. Normal mood and affect. General:   Alert,  Well-developed, well-nourished, pleasant and cooperative in NAD Head:  Normocephalic and atraumatic. Eyes:  Sclera  clear, no icterus.   Conjunctiva pink. Ears:  Normal auditory acuity. Nose:  No deformity, discharge, or lesions. Mouth:  No deformity or lesions,oropharynx pink & moist. Neck:  Supple; no masses or thyromegaly. Lungs:  Respirations even and unlabored.  Clear throughout to auscultation.   No wheezes, crackles, or rhonchi. No acute distress. Heart:  Regular rate and rhythm; no murmurs, clicks, rubs, or gallops. Abdomen:  Normal bowel sounds.  No bruits.  Soft, non-tender and non-distended without masses, hepatosplenomegaly or hernias noted.  No guarding or rebound tenderness.    Extremities:  Bruises over arms.  Neurologic:  Alert and oriented x3;  grossly normal neurologically. Skin:  Intact without significant lesions or rashes. No jaundice. Lymph Nodes:  No significant cervical adenopathy. Psych:  Alert and cooperative. Normal mood and affect.  Imaging Studies: No results found.  Assessment and Plan:   Kristen Barron is a 84 y.o. y/o female has been referred for diarrhea which has resolved, rectal bleeding which she also says has resolved , on and off abdominal pain in the lower abdomen relieved after a bowel movement suggestive of IBS-C  Plan  1. Fiber pill supplement samples provide 2. Trial of IB guard 3. If above does not work will try Amitiza or linzess  Follow up PRN  Dr Jonathon Bellows MD,MRCP(U.K)

## 2018-03-22 ENCOUNTER — Other Ambulatory Visit: Payer: Self-pay | Admitting: Family Medicine

## 2018-03-22 DIAGNOSIS — Z1231 Encounter for screening mammogram for malignant neoplasm of breast: Secondary | ICD-10-CM

## 2018-04-06 ENCOUNTER — Ambulatory Visit
Admission: RE | Admit: 2018-04-06 | Discharge: 2018-04-06 | Disposition: A | Discharge status: Home / Self Care | Payer: Medicare HMO | Source: Ambulatory Visit | Attending: Family Medicine | Admitting: Family Medicine

## 2018-04-06 DIAGNOSIS — Z1231 Encounter for screening mammogram for malignant neoplasm of breast: Secondary | ICD-10-CM | POA: Insufficient documentation

## 2018-05-10 NOTE — Progress Notes (Signed)
Patient ID: Kristen Barron, female    DOB: 06/23/1929, 84 y.o.   MRN: [id number]  HPI  Kristen Barron is a 84 y/o female with a history of breast cancer, DM, HTN, CKD, stroke, DVT and chronic heart failure.   Echo report from 12/10/17 reviewed and showed an EF of 50-55%.  Admitted 12/09/17 due to HTN emergency along with acute on chronic HF. Nitropaste applied and IV lasix given. Cardiology consult obtained. Medications were then adjusted and Kristen Barron was discharged after 2 days.   Kristen Barron presents today for a follow-up visit with a chief complaint of minimal shortness of breath upon moderate exertion. Kristen Barron says that this has been chronic in nature having been present for several years. Kristen Barron has associated fatigue, pedal edema, light-headedness, difficulty sleeping and easy bruising along with this. Kristen Barron denies any abdominal distention, palpitations, chest pain or weight gain.   Past Medical History:  Diagnosis Date  . Arthritis   . Breast cancer ([place]) 03/06/2014   right breast with mastectomy and chemo  . Cancer [facility])    Reports lumpectomy for a cyst  . CHF (congestive heart failure) ([place])   . Collagen vascular disease ([place])   . Diabetes ([place])   . DVT (deep venous thrombosis) ([place])   . Hemorrhoids   . Hypertension   . Renal disorder   . Renal insufficiency   . Stroke [facility])    August 2014, but has had strokes prior as well   Past Surgical History:  Procedure Laterality Date  . ABDOMINAL HYSTERECTOMY     Patient not clear as to why  . BACK SURGERY    . BREAST BIOPSY Right February 15 2014   invasive mammary carcinoma/ER/PR negative, Kristen Barron 2 positive  . BREAST BIOPSY Right 1999   negative biopsy  . BREAST SURGERY Right 03/06/14   mastectomy  . COLONOSCOPY WITH PROPOFOL N/A 04/15/2016   Procedure: COLONOSCOPY WITH PROPOFOL;  Surgeon: Manya Silvas, MD;  Location: [facility] ENDOSCOPY;  Service: Endoscopy;  Laterality: N/A;  . ESOPHAGOGASTRODUODENOSCOPY (EGD) WITH PROPOFOL N/A 04/15/2016   Procedure:  ESOPHAGOGASTRODUODENOSCOPY (EGD) WITH PROPOFOL;  Surgeon: Manya Silvas, MD;  Location: [facility] ENDOSCOPY;  Service: Endoscopy;  Laterality: N/A;  . HAND SURGERY Right    Carpel tunnel release in the 1970s  . LUMBAR LAMINECTOMY/DECOMPRESSION MICRODISCECTOMY Bilateral 07/24/2013   Procedure: LUMBAR LAMINECTOMY/DECOMPRESSION MICRODISCECTOMY LUMBAR THREE-FOUR;  Surgeon: Charlie Pitter, MD;  Location: [place] NEURO ORS;  Service: Neurosurgery;  Laterality: Bilateral;  . MASTECTOMY Right 03/06/2014   right br ca  . OOPHORECTOMY     Family History  Problem Relation Age of Onset  . CAD Father   . Cancer Sister 20       breast  . Breast cancer Sister 76  . Cancer Brother        kidney  . Cancer Maternal Aunt        breast  . Breast cancer Maternal Aunt 70  . Cancer Other        maternal niece with breast cancer  . Breast cancer Other   . Cancer Sister        pancreatic   Social History   Tobacco Use  . Smoking status: Never Smoker  . Smokeless tobacco: Never Used  Substance Use Topics  . Alcohol use: No   No Known Allergies  Prior to Admission medications   Medication Sig Start Date End Date Taking? Authorizing Provider  amoxicillin (AMOXIL) 500 MG capsule  01/20/18  Yes [provider]  benazepril (LOTENSIN) 40 MG tablet Take 1 tablet (40 mg total) by mouth daily. 12/12/17  Yes Gouru, Illene Silver, MD  clopidogrel (PLAVIX) 75 MG tablet Take 75 mg by mouth daily with breakfast.   Yes [provider]  gabapentin (NEURONTIN) 600 MG tablet Take 600 mg by mouth 4 (four) times daily.  11/25/16  Yes [provider]  Glucosamine 500 MG CAPS Take 500 mg by mouth 3 (three) times daily.   Yes [provider]  hydrALAZINE (APRESOLINE) 50 MG tablet Take 50 mg by mouth 3 (three) times daily.    Yes [provider]  insulin aspart (NOVOLOG) 100 UNIT/ML injection Inject 4 Units into the skin 3 (three) times daily before meals. Patient is only taking with biggest meal    Yes [provider]  insulin detemir (LEVEMIR) 100 UNIT/ML injection Inject 18 Units into the skin at bedtime.   Yes [provider]  metoprolol succinate (TOPROL-XL) 100 MG 24 hr tablet Take 1 tablet (100 mg total) by mouth daily. Take with or immediately following a meal. 12/12/17  Yes Gouru, Aruna, MD  NORCO 5-325 MG tablet Take 1 tablet by mouth every 6 (six) hours as needed (back pain).  10/19/17  Yes [provider]  Omega 3 1200 MG CAPS Take 1 capsule by mouth daily.    Yes [provider]  ondansetron (ZOFRAN-ODT) 4 MG disintegrating tablet Take 4 mg by mouth every 8 (eight) hours as needed for nausea or vomiting.   Yes [provider]  oxybutynin (DITROPAN-XL) 5 MG 24 hr tablet Take 5 mg by mouth daily.   Yes [provider]  pantoprazole (PROTONIX) 40 MG tablet Take 40 mg by mouth daily.   Yes [provider]  pravastatin (PRAVACHOL) 80 MG tablet Take 80 mg by mouth every evening.    Yes [provider]  silver sulfADIAZINE (SILVADENE) 1 % cream Apply 1 application topically 2 (two) times daily.   Yes [provider]  torsemide (DEMADEX) 20 MG tablet Take 1 tablet (20 mg total) by mouth daily. Start tomorrow 11/1 06/10/17  Yes Max Sane, MD    Review of Systems  Constitutional: Positive for appetite change (decreased) and fatigue.  HENT: Positive for hearing loss. Negative for congestion, postnasal drip and sore throat.   Eyes: Negative.   Respiratory: Positive for shortness of breath (with moderate exertion). Negative for chest tightness.   Cardiovascular: Positive for leg swelling. Negative for chest pain and palpitations.  Gastrointestinal: Negative for abdominal distention and abdominal pain.  Endocrine: Negative.   Genitourinary: Negative.   Musculoskeletal: Positive for arthralgias (legs) and back pain (right lower back).  Skin: Negative.   Allergic/Immunologic: Negative.   Neurological: Positive  for light-headedness. Negative for dizziness.  Hematological: Negative for adenopathy. Bruises/bleeds easily.  Psychiatric/Behavioral: Positive for sleep disturbance (waking up frequently). Negative for dysphoric mood. The patient is not nervous/anxious.    Vitals:   05/11/18 1116  BP: (!) 155/42  Pulse: 65  Resp: 18  SpO2: 97%  Weight: 180 lb 2 oz (81.7 kg)  Height: 5\' 1"  (1.549 m)   Wt Readings from Last 3 Encounters:  05/11/18 180 lb 2 oz (81.7 kg)  01/24/18 193 lb 3.2 oz (87.6 kg)  01/19/18 183 lb 9.6 oz (83.3 kg)   Lab Results  Component Value Date   CREATININE 1.37 (H) 12/11/2017   CREATININE 1.10 (H) 12/09/2017   CREATININE 1.43 (H) 08/08/2017    Physical Exam  Constitutional: Kristen Barron is oriented to  person, place, and time. Kristen Barron appears well-developed and well-nourished.  HENT:  Head: Normocephalic and atraumatic.  Neck: Normal range of motion. Neck supple. No JVD present.  Cardiovascular: Normal rate and regular rhythm.  Pulmonary/Chest: Effort normal. No respiratory distress. Kristen Barron has no wheezes. Kristen Barron has no rales.  Abdominal: Soft. Kristen Barron exhibits no distension.  Musculoskeletal:       Right lower leg: Kristen Barron exhibits edema (trace pitting edema).       Left lower leg: Kristen Barron exhibits edema (trace pitting edema).  Neurological: Kristen Barron is alert and oriented to person, place, and time.  Skin: Skin is warm and dry.  Psychiatric: Kristen Barron has a normal mood and affect. Kristen Barron behavior is normal.  Nursing note and vitals reviewed.  Assessment & Plan:  1: Chronic heart failure with preserved ejection fraction- - NYHA class II - minimally fluid overloaded with pedal edema although stable - weighing daily and Kristen Barron was instructed to call for an overnight weight gain of >2 pounds or a weekly weight gain of >5 pounds - weight down 3 pounds from last time Kristen Barron was here 3 1/2 weeks ago - not adding salt but says that Kristen Barron hasn't been reading food labels much. Reviewed the importance of reading food  labels so that Kristen Barron can closely follow a 2000mg  sodium diet.  - BNP 12/09/17 was 424.0 - has received flu vaccine for this season  2: HTN- - BP mildly elevated today - continue to monitor - sees PCP Posey Pronto) tomorrow - BMP 12/11/17 reviewed and showed sodium 134, potassium 4.1 and GFR 34  3: Diabetes-  - fasting glucose at home today was 78 - A1c 12/09/17 was 5.7%  4: Lymphedema- - stage 2 - limited in exercise due to instability and use of walker - encouraged Kristen Barron to elevate Kristen Barron legs when sitting for long periods of time - encouraged Kristen Barron to get compression socks and put them on every morning with removal at bedtime  Patient did not bring Kristen Barron medications nor a list. Each medication was verbally reviewed with the patient and Kristen Barron was encouraged to bring the bottles to every visit to confirm accuracy of list.  Patient opts to not return at this time. Advised Kristen Barron that Kristen Barron could call back at anytime to make another appointment.

## 2018-05-11 ENCOUNTER — Encounter: Payer: Self-pay | Admitting: Family

## 2018-05-11 ENCOUNTER — Ambulatory Visit: Payer: Medicare HMO | Attending: Family | Admitting: Family

## 2018-05-11 VITALS — BP 155/42 | HR 65 | Resp 18 | Ht 61.0 in | Wt 180.1 lb

## 2018-05-11 DIAGNOSIS — Z8673 Personal history of transient ischemic attack (TIA), and cerebral infarction without residual deficits: Secondary | ICD-10-CM | POA: Insufficient documentation

## 2018-05-11 DIAGNOSIS — Z7902 Long term (current) use of antithrombotics/antiplatelets: Secondary | ICD-10-CM | POA: Insufficient documentation

## 2018-05-11 DIAGNOSIS — Z79899 Other long term (current) drug therapy: Secondary | ICD-10-CM | POA: Insufficient documentation

## 2018-05-11 DIAGNOSIS — Z803 Family history of malignant neoplasm of breast: Secondary | ICD-10-CM | POA: Diagnosis not present

## 2018-05-11 DIAGNOSIS — Z8051 Family history of malignant neoplasm of kidney: Secondary | ICD-10-CM | POA: Insufficient documentation

## 2018-05-11 DIAGNOSIS — Z9221 Personal history of antineoplastic chemotherapy: Secondary | ICD-10-CM | POA: Diagnosis not present

## 2018-05-11 DIAGNOSIS — I10 Essential (primary) hypertension: Secondary | ICD-10-CM

## 2018-05-11 DIAGNOSIS — I890 Lymphedema, not elsewhere classified: Secondary | ICD-10-CM | POA: Insufficient documentation

## 2018-05-11 DIAGNOSIS — Z8739 Personal history of other diseases of the musculoskeletal system and connective tissue: Secondary | ICD-10-CM | POA: Diagnosis not present

## 2018-05-11 DIAGNOSIS — Z794 Long term (current) use of insulin: Secondary | ICD-10-CM | POA: Diagnosis not present

## 2018-05-11 DIAGNOSIS — Z853 Personal history of malignant neoplasm of breast: Secondary | ICD-10-CM | POA: Insufficient documentation

## 2018-05-11 DIAGNOSIS — I130 Hypertensive heart and chronic kidney disease with heart failure and stage 1 through stage 4 chronic kidney disease, or unspecified chronic kidney disease: Secondary | ICD-10-CM | POA: Insufficient documentation

## 2018-05-11 DIAGNOSIS — N189 Chronic kidney disease, unspecified: Secondary | ICD-10-CM | POA: Diagnosis not present

## 2018-05-11 DIAGNOSIS — Z86718 Personal history of other venous thrombosis and embolism: Secondary | ICD-10-CM | POA: Insufficient documentation

## 2018-05-11 DIAGNOSIS — E1122 Type 2 diabetes mellitus with diabetic chronic kidney disease: Secondary | ICD-10-CM | POA: Diagnosis not present

## 2018-05-11 DIAGNOSIS — E119 Type 2 diabetes mellitus without complications: Secondary | ICD-10-CM

## 2018-05-11 DIAGNOSIS — Z9010 Acquired absence of unspecified breast and nipple: Secondary | ICD-10-CM | POA: Insufficient documentation

## 2018-05-11 DIAGNOSIS — R0602 Shortness of breath: Secondary | ICD-10-CM | POA: Diagnosis present

## 2018-05-11 DIAGNOSIS — M1990 Unspecified osteoarthritis, unspecified site: Secondary | ICD-10-CM | POA: Insufficient documentation

## 2018-05-11 DIAGNOSIS — Z8249 Family history of ischemic heart disease and other diseases of the circulatory system: Secondary | ICD-10-CM | POA: Insufficient documentation

## 2018-05-11 DIAGNOSIS — IMO0001 Reserved for inherently not codable concepts without codable children: Secondary | ICD-10-CM

## 2018-05-11 DIAGNOSIS — I5032 Chronic diastolic (congestive) heart failure: Secondary | ICD-10-CM | POA: Insufficient documentation

## 2018-05-11 NOTE — Patient Instructions (Signed)
Continue weighing daily and call for an overnight weight gain of > 2 pounds or a weekly weight gain of >5 pounds. 

## 2018-05-12 ENCOUNTER — Encounter: Payer: Self-pay | Admitting: Family

## 2018-05-23 ENCOUNTER — Ambulatory Visit: Payer: Medicare HMO | Admitting: Family

## 2018-06-14 ENCOUNTER — Telehealth: Payer: Self-pay | Admitting: *Deleted

## 2018-06-14 NOTE — Telephone Encounter (Signed)
Dr Posey Pronto wanted to know if patient needed to follow up with [place], she has not been seen since 2018. Patient No Showed for her Feb 2019 appointment. I entered a schedule order to call her and schedule a follow up appt

## 2018-06-14 NOTE — Telephone Encounter (Signed)
Ok, thanks.

## 2018-06-19 NOTE — Progress Notes (Signed)
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  INTERVAL HISTORY:   Patient was last evaluated in clinic in August 2018.  She is referred back for further evaluation and routine follow-up.  She continues to feel well and remains asymptomatic. She has no neurologic complaints. She denies any recent fevers or illnesses.  She denies any pain.  She has a good appetite and denies weight loss. She has no chest pain or shortness of breath. She denies any nausea, vomiting, constipation, or diarrhea. She has no urinary complaints.  Patient feels at her baseline offers no specific complaints today.  REVIEW OF SYSTEMS:   Review of Systems  Constitutional: Negative.  Negative for fever, malaise/fatigue and weight loss.  Respiratory: Negative.  Negative for cough and shortness of breath.   Cardiovascular: Negative.  Negative for chest pain and leg swelling.  Gastrointestinal: Negative.  Negative for abdominal pain.  Genitourinary: Negative.  Negative for dysuria.  Musculoskeletal: Positive for back pain and joint pain.  Skin: Negative.  Negative for rash.  Neurological: Negative.  Negative for focal weakness, weakness and headaches.  Psychiatric/Behavioral: Negative.  The patient is not nervous/anxious.     As per HPI. Otherwise, a complete review of systems is negative.  ONCOLOGY HISTORY: Oncology History   1. Carcinoma of the right breast T1c N0 M0 tumor based on ultrasound and the breast mammogram. ER negative, PR negative, HER-2/neu positive (diagnosis in July of 2015). 2. Iron deficiency anemia  with chronic renal disease. 3. Status post mastectomy (right) in August of 2015. Patient has stage IC, ER negative, PR negative, HER-2 receptor positive.  4. Patient started on chemotherapy with Taxol and Herceptin on April 09, 2014. 5. Because of progressive side effect Taxol was discontinued (August 28, 2014) Herceptin would be continued to July of 2016 6.  Because of swelling of lower extremity and shortness of breath Herceptin was discontinued in March of 2016.     Breast cancer of lower-outer quadrant of right female breast ([place])   02/28/2014 Initial Diagnosis    Breast cancer, right, invasive ductal     PAST MEDICAL HISTORY: Past Medical History:  Diagnosis Date  . Arthritis   . Breast cancer ([place]) 03/06/2014   right breast with mastectomy and chemo  . Cancer [facility])    Reports lumpectomy for a cyst  . CHF (congestive heart failure) ([place])   . Collagen vascular disease ([place])   . Diabetes ([place])   . DVT (deep venous thrombosis) ([place])   . Hemorrhoids   . Hypertension   . Renal disorder   . Renal insufficiency   . Stroke [facility])    August 2014, but has had strokes prior as well    PAST SURGICAL HISTORY: Past Surgical History:  Procedure Laterality Date  . ABDOMINAL HYSTERECTOMY     Patient not clear as to why  . BACK SURGERY    . BREAST BIOPSY Right February 15 2014   invasive mammary carcinoma/ER/PR negative, HER 2 positive  . BREAST BIOPSY Right 1999   negative biopsy  . BREAST SURGERY Right 03/06/14   mastectomy  . COLONOSCOPY WITH PROPOFOL N/A 04/15/2016  Procedure: COLONOSCOPY WITH PROPOFOL;  Surgeon: Manya Silvas, MD;  Location[facility]COPY;  Service: Endoscopy;  Laterality: N/A;  . ESOPHAGOGASTRODUODENOSCOPY (EGD) WITH PROPOFOL N/A 04/15/2016   Procedure: ESOPHAGOGASTRODUODENOSCOPY (EGD) WITH PROPOFOL;  Surgeon: Manya Silvas, MD;  Location: [facility] ENDOSCOPY;  Service: Endoscopy;  Laterality: N/A;  . HAND SURGERY Right    Carpel tunnel release in the 1970s  .  LUMBAR LAMINECTOMY/DECOMPRESSION MICRODISCECTOMY Bilateral 07/24/2013   Procedure: LUMBAR LAMINECTOMY/DECOMPRESSION MICRODISCECTOMY LUMBAR THREE-FOUR;  Surgeon: Charlie Pitter, MD;  Location: [place] NEURO ORS;  Service: Neurosurgery;  Laterality: Bilateral;  . MASTECTOMY Right 03/06/2014   right br ca  . OOPHORECTOMY      FAMILY HISTORY Family History  Problem Relation Age of Onset  . CAD Father   . Cancer Sister 66       breast  . Breast cancer Sister 66  . Cancer Brother        kidney  . Cancer Maternal Aunt        breast  . Breast cancer Maternal Aunt 70  . Cancer Other        maternal niece with breast cancer  . Breast cancer Other   . Cancer Sister        pancreatic    GYNECOLOGIC HISTORY:  No LMP recorded. Patient has had a hysterectomy.     ADVANCED DIRECTIVES:    HEALTH MAINTENANCE: Social History   Tobacco Use  . Smoking status: Never Smoker  . Smokeless tobacco: Never Used  Substance Use Topics  . Alcohol use: No  . Drug use: No    No Known Allergies  Current Outpatient Medications  Medication Sig Dispense Refill  . benazepril (LOTENSIN) 40 MG tablet Take 1 tablet (40 mg total) by mouth daily. 30 tablet 0  . clopidogrel (PLAVIX) 75 MG tablet Take 75 mg by mouth daily with breakfast.    . gabapentin (NEURONTIN) 600 MG tablet Take 600 mg by mouth 4 (four) times daily.     . Glucosamine 500 MG CAPS Take 500 mg by mouth 3 (three) times daily.    . hydrALAZINE (APRESOLINE) 50 MG tablet Take 50 mg by mouth 3 (three) times daily.     . insulin aspart (NOVOLOG) 100 UNIT/ML injection Inject 4 Units into the skin 3 (three) times daily before meals. Patient is only taking with biggest meal    . insulin detemir (LEVEMIR) 100 UNIT/ML injection Inject 18 Units into the skin at bedtime.    . metoprolol succinate (TOPROL-XL) 100 MG 24 hr tablet Take 1 tablet (100 mg total) by mouth daily. Take with or immediately following a meal. 30 tablet 0  . NORCO 5-325 MG tablet Take 1  tablet by mouth every 6 (six) hours as needed (back pain).     . Omega 3 1200 MG CAPS Take 1 capsule by mouth daily.     Marland Kitchen oxybutynin (DITROPAN-XL) 5 MG 24 hr tablet Take 5 mg by mouth daily.    . pantoprazole (PROTONIX) 40 MG tablet Take 40 mg by mouth daily.    . pravastatin (PRAVACHOL) 80 MG tablet Take 80 mg by mouth every evening.     . silver sulfADIAZINE (SILVADENE) 1 % cream Apply 1 application topically 2 (two) times daily.    Marland Kitchen dicyclomine (BENTYL) 10 MG capsule     . ondansetron (ZOFRAN-ODT) 4 MG disintegrating tablet Take 4 mg by mouth every 8 (eight) hours as needed for nausea or vomiting.     No current facility-administered  medications for this visit.    Facility-Administered Medications Ordered in Other Visits  Medication Dose Route Frequency Provider Last Rate Last Dose  . heparin lock flush 100 unit/mL  500 Units Intravenous Once Lloyd Huger, MD      . sodium chloride flush (NS) 0.9 % injection 10 mL  10 mL Intravenous PRN Lloyd Huger, MD   10 mL at 09/23/16 1103  . sodium chloride flush (NS) 0.9 % injection 10 mL  10 mL Intravenous PRN Grayland Ormond, Kathlene November, MD        OBJECTIVE: BP (!) 186/69 (BP Location: Left Arm, Patient Position: Sitting)   Pulse 80   Temp (!) 96.5 F (35.8 C) (Tympanic)   Resp 18   Wt 173 lb 8 oz (78.7 kg)   BMI 32.78 kg/m    Body mass index is 32.78 kg/m.    ECOG FS:1 - Symptomatic but completely ambulatory  General: Well-developed, well-nourished, no acute distress. Eyes: Pink conjunctiva, anicteric sclera. HEENT: Normocephalic, moist mucous membranes, clear oropharnyx. Breast: Patient declined breast exam today. Lungs: Clear to auscultation bilaterally. Heart: Regular rate and rhythm. No rubs, murmurs, or gallops. Abdomen: Soft, nontender, nondistended. No organomegaly noted, normoactive bowel sounds. Musculoskeletal: No edema, cyanosis, or clubbing. Neuro: Alert, answering all questions appropriately. Cranial nerves  grossly intact. Skin: No rashes or petechiae noted. Psych: Normal affect.   LAB RESULTS:  No visits with results within 3 Day(s) from this visit.  Latest known visit with results is:  Admission on 12/09/2017, Discharged on 12/11/2017  Component Date Value Ref Range Status  . WBC 12/09/2017 5.8  3.6 - 11.0 K/uL Final  . RBC 12/09/2017 3.17* 3.80 - 5.20 MIL/uL Final  . Hemoglobin 12/09/2017 9.2* 12.0 - 16.0 g/dL Final  . HCT 12/09/2017 27.6* 35.0 - 47.0 % Final  . MCV 12/09/2017 87.2  80.0 - 100.0 fL Final  . MCH 12/09/2017 28.9  26.0 - 34.0 pg Final  . MCHC 12/09/2017 33.2  32.0 - 36.0 g/dL Final  . RDW 12/09/2017 15.3* 11.5 - 14.5 % Final  . Platelets 12/09/2017 295  150 - 440 K/uL Final  . Neutrophils Relative % 12/09/2017 65  % Final  . Neutro Abs 12/09/2017 3.8  1.4 - 6.5 K/uL Final  . Lymphocytes Relative 12/09/2017 23  % Final  . Lymphs Abs 12/09/2017 1.3  1.0 - 3.6 K/uL Final  . Monocytes Relative 12/09/2017 8  % Final  . Monocytes Absolute 12/09/2017 0.5  0.2 - 0.9 K/uL Final  . Eosinophils Relative 12/09/2017 3  % Final  . Eosinophils Absolute 12/09/2017 0.2  0 - 0.7 K/uL Final  . Basophils Relative 12/09/2017 1  % Final  . Basophils Absolute 12/09/2017 0.1  0 - 0.1 K/uL Final   Performed at [facility], [address], [place], [place] [address]  . Sodium 12/09/2017 135  135 - 145 mmol/L Final  . Potassium 12/09/2017 4.2  3.5 - 5.1 mmol/L Final  . Chloride 12/09/2017 104  101 - 111 mmol/L Final  . CO2 12/09/2017 22  22 - 32 mmol/L Final  . Glucose, Bld 12/09/2017 130* 65 - 99 mg/dL Final  . BUN 12/09/2017 15  6 - 20 mg/dL Final  . Creatinine, Ser 12/09/2017 1.10* 0.44 - 1.00 mg/dL Final  . Calcium 12/09/2017 8.2* 8.9 - 10.3 mg/dL Final  . Total Protein 12/09/2017 6.9  6.5 - 8.1 g/dL Final  . Albumin 12/09/2017 3.5  3.5 - 5.0 g/dL Final  . AST 12/09/2017 16  15 - 41 U/L Final  . ALT 12/09/2017 9* 14 - 54 U/L Final  . Alkaline Phosphatase 12/09/2017 64  38  - 126 U/L Final  . Total Bilirubin 12/09/2017 0.3  0.3 - 1.2 mg/dL Final  . GFR calc non Af Amer 12/09/2017 44* >60 mL/min Final  . GFR calc Af Amer 12/09/2017 51* >60 mL/min Final   Comment: (NOTE) The eGFR has been calculated using the CKD EPI equation. This calculation has not been validated in all clinical situations. eGFR's persistently <60 mL/min signify possible Chronic Kidney Disease.   Georgiann Hahn gap 12/09/2017 9  5 - 15 Final   Performed at [facility], [address] [place], [place] [address]  . B Natriuretic Peptide 12/09/2017 424.0* 0.0 - 100.0 pg/mL Final   Performed at [facility], [address] [place], [place] [address]  . Magnesium 12/09/2017 1.7  1.7 - 2.4 mg/dL Final   Performed at [facility], [address] [place], [place] [address]  . Troponin I 12/09/2017 <0.03  <0.03 ng/mL Final   Performed at [facility], [address] [place], [place] [address]  . Prothrombin Time 12/09/2017 12.6  11.4 - 15.2 seconds Final  . INR 12/09/2017 0.95   Final   Performed at [facility], [address] [place], [place] [address]  . aPTT 12/09/2017 28  24 - 36 seconds Final   Performed at [facility], [address] [place], [place] [address]  . TSH 12/09/2017 1.382  0.350 - 4.500 uIU/mL Final   Comment: Performed by a 3rd Generation assay with a functional sensitivity of <=0.01 uIU/mL. Performed at [facility], [address], [place], [place] [address]   . Hgb A1c MFr Bld 12/09/2017 5.7* 4.8 - 5.6 % Final   Comment: (NOTE) Pre diabetes:          5.7%-6.4% Diabetes:              >6.4% Glycemic control for   <7.0% adults with diabetes   . Mean Plasma Glucose 12/09/2017 116.89  mg/dL Final   Performed at [place] [address] [place],  [address]  . Glucose-Capillary 12/09/2017 246* 65 - 99 mg/dL Final  . Comment 1 12/09/2017 Notify RN   Final  . Comment 2 12/09/2017 Document  in Chart   Final  . Glucose-Capillary 12/10/2017 104* 65 - 99 mg/dL Final  . Glucose-Capillary 12/10/2017 150* 65 - 99 mg/dL Final  . Weight 12/10/2017 2,795.2  oz Final  . Height 12/10/2017 61  in Final  . BP 12/10/2017 151/60  mmHg Final  . Sodium 12/11/2017 134* 135 - 145 mmol/L Final  . Potassium 12/11/2017 4.1  3.5 - 5.1 mmol/L Final  . Chloride 12/11/2017 98* 101 - 111 mmol/L Final  . CO2 12/11/2017 28  22 - 32 mmol/L Final  . Glucose, Bld 12/11/2017 109* 65 - 99 mg/dL Final  . BUN 12/11/2017 19  6 - 20 mg/dL Final  . Creatinine, Ser 12/11/2017 1.37* 0.44 - 1.00 mg/dL Final  . Calcium 12/11/2017 8.1* 8.9 - 10.3 mg/dL Final  . GFR calc non Af Amer 12/11/2017 34* >60 mL/min Final  . GFR calc Af Amer 12/11/2017 39* >60 mL/min Final   Comment: (NOTE) The eGFR has been calculated using the CKD EPI equation. This calculation has not been validated in all clinical situations. eGFR's persistently <60 mL/min signify possible Chronic Kidney Disease.   . Anion gap 12/11/2017 8  5 - 15 Final   Performed at [facility], [address] [place], [place] [address]  . Glucose-Capillary 12/10/2017 110* 65 - 99 mg/dL Final  . Glucose-Capillary 12/10/2017 140* 65 - 99 mg/dL Final  . Comment 1 12/10/2017 Notify RN   Final  . Comment 2 12/10/2017 Document in Chart   Final  . Glucose-Capillary 12/11/2017 107* 65 - 99 mg/dL Final  . Comment 1 12/11/2017 Notify RN   Final  . Glucose-Capillary 12/11/2017 123* 65 - 99 mg/dL Final  . Comment 1 12/11/2017 Notify RN   Final    STUDIES: No results found.  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  PLAN:      1. Stage Ia ER/PR negative, HER-2 positive adenoarcinoma of the lower outer quadrant of the right breast:  No evidence of recurrent disease. Patient is status post right mastectomy on March 06, 2014.  Patient also received Taxol and Herceptin between  August 2015 and to January 2016. Taxol was discontinued secondary to side effects. Herceptin was also subsequently discontinued in March 2016.  Patient's most recent mammogram on April 06, 2018 was reported as BI-RADS 2.  Repeat in August 2020.  Patient request less frequent follow-up, therefore return to clinic in 1 year for further evaluation.    I spent a total of 20 minutes face-to-face with the patient of which greater than 50% of the visit was spent in counseling and coordination of care as detailed above.   Patient expressed understanding and was in agreement with this plan. She also understands that She can call clinic at any time with any questions, concerns, or complaints.    Breast cancer, right, invasive ductal   Staging form: Breast, AJCC 7th Edition     Clinical: Stage IA (T1c, N0, M0) - Unsigned   Lloyd Huger, MD   06/22/2018 5:16 PM

## 2018-06-21 ENCOUNTER — Telehealth: Payer: Self-pay | Admitting: General Surgery

## 2018-06-21 ENCOUNTER — Inpatient Hospital Stay (HOSPITAL_BASED_OUTPATIENT_CLINIC_OR_DEPARTMENT_OTHER): Payer: Medicare HMO | Admitting: Oncology

## 2018-06-21 ENCOUNTER — Inpatient Hospital Stay: Payer: Medicare HMO | Attending: Oncology

## 2018-06-21 ENCOUNTER — Other Ambulatory Visit: Payer: Self-pay

## 2018-06-21 VITALS — BP 186/69 | HR 80 | Temp 96.5°F | Resp 18 | Wt 173.5 lb

## 2018-06-21 DIAGNOSIS — Z9221 Personal history of antineoplastic chemotherapy: Secondary | ICD-10-CM | POA: Insufficient documentation

## 2018-06-21 DIAGNOSIS — D509 Iron deficiency anemia, unspecified: Secondary | ICD-10-CM | POA: Insufficient documentation

## 2018-06-21 DIAGNOSIS — Z95828 Presence of other vascular implants and grafts: Secondary | ICD-10-CM

## 2018-06-21 DIAGNOSIS — C50511 Malignant neoplasm of lower-outer quadrant of right female breast: Secondary | ICD-10-CM | POA: Diagnosis present

## 2018-06-21 DIAGNOSIS — Z79899 Other long term (current) drug therapy: Secondary | ICD-10-CM

## 2018-06-21 DIAGNOSIS — Z9011 Acquired absence of right breast and nipple: Secondary | ICD-10-CM

## 2018-06-21 DIAGNOSIS — Z171 Estrogen receptor negative status [ER-]: Secondary | ICD-10-CM

## 2018-06-21 DIAGNOSIS — I10 Essential (primary) hypertension: Secondary | ICD-10-CM | POA: Diagnosis not present

## 2018-06-21 MED ORDER — HEPARIN SOD (PORK) LOCK FLUSH 100 UNIT/ML IV SOLN: 500.0000 [IU] | Freq: Once | INTRAVENOUS | Status: AC

## 2018-06-21 MED ORDER — SODIUM CHLORIDE 0.9% FLUSH
10.0000 mL | INTRAVENOUS | Status: AC | PRN
  Filled 2018-06-21: qty 10

## 2018-06-21 MED ORDER — HEPARIN SOD (PORK) LOCK FLUSH 100 UNIT/ML IV SOLN
INTRAVENOUS | Status: AC
  Filled 2018-06-21: qty 5

## 2018-06-21 NOTE — Progress Notes (Signed)
Here for follow up. Large contusion on r upper arm -pt stated she fell 3 days ago.  Feels " ok" in general .  Stated she is anxious to have " port taken out "

## 2018-06-21 NOTE — Telephone Encounter (Signed)
Referral sent for port removal from Dr Grayland Ormond. Called patient and made an appointment with Dr Bary Castilla on 07/14/18 @ 4:30. Patient is aware port removal will be done in the office on this day. She was advised that the RN will call with medication directions.  Please call patient with directions for Plavix/asprin. Phone number verified [phone].

## 2018-06-23 NOTE — Telephone Encounter (Signed)
Please stop Plavix for one week prior to office procedure on 07-14-18, please take 81 mg of aspirin during this time, per Dr Bary Castilla. Thanks.

## 2018-06-29 NOTE — Telephone Encounter (Signed)
Notified patient as instructed, patient pleased. Discussed follow-up appointments, patient agrees, letter mailed.

## 2018-07-14 ENCOUNTER — Ambulatory Visit: Payer: Self-pay | Admitting: General Surgery

## 2018-07-21 ENCOUNTER — Other Ambulatory Visit: Payer: Self-pay

## 2018-07-21 ENCOUNTER — Encounter: Payer: Self-pay | Admitting: General Surgery

## 2018-07-21 ENCOUNTER — Ambulatory Visit (INDEPENDENT_AMBULATORY_CARE_PROVIDER_SITE_OTHER): Payer: Medicare HMO | Admitting: General Surgery

## 2018-07-21 VITALS — BP 194/73 | HR 98 | Temp 97.9°F | Resp 16 | Ht 61.0 in | Wt 173.0 lb

## 2018-07-21 DIAGNOSIS — Z853 Personal history of malignant neoplasm of breast: Secondary | ICD-10-CM | POA: Diagnosis not present

## 2018-07-21 NOTE — Progress Notes (Signed)
Patient ID: Kristen Barron, female   DOB: 12-13-1928, 84 y.o.   MRN: [id number]  Chief Complaint  Patient presents with  . Procedure    port removal    HPI Kristen Barron is a 84 y.o. female.  Here for port removal.  Request for port removal has been received.  The patient discontinued Plavix 1 week ago as requested.  HPI  Past Medical History:  Diagnosis Date  . Arthritis   . Breast cancer ([place]) 03/06/2014   right breast with mastectomy and chemo  . Cancer [facility])    Reports lumpectomy for a cyst  . CHF (congestive heart failure) ([place])   . Collagen vascular disease ([place])   . Diabetes ([place])   . DVT (deep venous thrombosis) ([place])   . Hemorrhoids   . Hypertension   . Renal disorder   . Renal insufficiency   . Stroke [facility])    August 2014, but has had strokes prior as well    Past Surgical History:  Procedure Laterality Date  . ABDOMINAL HYSTERECTOMY     Patient not clear as to why  . BACK SURGERY    . BREAST BIOPSY Right February 15 2014   invasive mammary carcinoma/ER/PR negative, HER 2 positive  . BREAST BIOPSY Right 1999   negative biopsy  . BREAST SURGERY Right 03/06/14   mastectomy  . COLONOSCOPY WITH PROPOFOL N/A 04/15/2016   Procedure: COLONOSCOPY WITH PROPOFOL;  Surgeon: Manya Silvas, MD;  Location: [facility] ENDOSCOPY;  Service: Endoscopy;  Laterality: N/A;  . ESOPHAGOGASTRODUODENOSCOPY (EGD) WITH PROPOFOL N/A 04/15/2016   Procedure: ESOPHAGOGASTRODUODENOSCOPY (EGD) WITH PROPOFOL;  Surgeon: Manya Silvas, MD;  Location: [facility] ENDOSCOPY;  Service: Endoscopy;  Laterality: N/A;  . HAND SURGERY Right    Carpel tunnel release in the 1970s  . LUMBAR LAMINECTOMY/DECOMPRESSION MICRODISCECTOMY Bilateral 07/24/2013   Procedure: LUMBAR LAMINECTOMY/DECOMPRESSION MICRODISCECTOMY LUMBAR THREE-FOUR;  Surgeon: Charlie Pitter, MD;  Location: [place] NEURO ORS;  Service: Neurosurgery;  Laterality: Bilateral;  . MASTECTOMY Right 03/06/2014   right br ca. Dr Jamal Collin  . OOPHORECTOMY      Family  History  Problem Relation Age of Onset  . CAD Father   . Cancer Sister 57       breast  . Breast cancer Sister 73  . Cancer Brother        kidney  . Cancer Maternal Aunt        breast  . Breast cancer Maternal Aunt 70  . Cancer Other        maternal niece with breast cancer  . Breast cancer Other   . Cancer Sister        pancreatic    Social History Social History   Tobacco Use  . Smoking status: Never Smoker  . Smokeless tobacco: Never Used  Substance Use Topics  . Alcohol use: No  . Drug use: No    No Known Allergies  Current Outpatient Medications  Medication Sig Dispense Refill  . benazepril (LOTENSIN) 40 MG tablet Take 1 tablet (40 mg total) by mouth daily. 30 tablet 0  . dicyclomine (BENTYL) 10 MG capsule     . gabapentin (NEURONTIN) 600 MG tablet Take 600 mg by mouth 4 (four) times daily.     . Glucosamine 500 MG CAPS Take 500 mg by mouth 3 (three) times daily.    . hydrALAZINE (APRESOLINE) 50 MG tablet Take 50 mg by mouth 3 (three) times daily.     . insulin aspart (NOVOLOG) 100 UNIT/ML  injection Inject 4 Units into the skin 3 (three) times daily before meals. Patient is only taking with biggest meal    . insulin detemir (LEVEMIR) 100 UNIT/ML injection Inject 18 Units into the skin at bedtime.    . metoprolol succinate (TOPROL-XL) 100 MG 24 hr tablet Take 1 tablet (100 mg total) by mouth daily. Take with or immediately following a meal. 30 tablet 0  . NORCO 5-325 MG tablet Take 1 tablet by mouth every 6 (six) hours as needed (back pain).     . Omega 3 1200 MG CAPS Take 1 capsule by mouth daily.     . ondansetron (ZOFRAN-ODT) 4 MG disintegrating tablet Take 4 mg by mouth every 8 (eight) hours as needed for nausea or vomiting.    Marland Kitchen oxybutynin (DITROPAN-XL) 5 MG 24 hr tablet Take 5 mg by mouth daily.    . pantoprazole (PROTONIX) 40 MG tablet Take 40 mg by mouth daily.    . pravastatin (PRAVACHOL) 80 MG tablet Take 80 mg by mouth every evening.     . clopidogrel  (PLAVIX) 75 MG tablet Take 75 mg by mouth daily with breakfast.     No current facility-administered medications for this visit.    Facility-Administered Medications Ordered in Other Visits  Medication Dose Route Frequency Provider Last Rate Last Dose  . heparin lock flush 100 unit/mL  500 Units Intravenous Once Lloyd Huger, MD      . sodium chloride flush (NS) 0.9 % injection 10 mL  10 mL Intravenous PRN Lloyd Huger, MD   10 mL at 09/23/16 1103  . sodium chloride flush (NS) 0.9 % injection 10 mL  10 mL Intravenous PRN Lloyd Huger, MD        Review of Systems Review of Systems  Constitutional: Negative.   Respiratory: Negative.   Cardiovascular: Negative.     Blood pressure (!) 194/73, pulse 98, temperature 97.9 F (36.6 C), temperature source Skin, resp. rate 16, height 5\' 1"  (1.549 m), weight 173 lb (78.5 kg), SpO2 98 %.  Physical Exam Physical Exam Constitutional:      Appearance: Normal appearance.  Chest:    Skin:    General: Skin is warm and dry.  Neurological:     Mental Status: She is alert and oriented to person, place, and time.  Psychiatric:        Mood and Affect: Mood normal.     Data Reviewed Area was cleaned with ChloraPrep and 10 cc of 0.5% Xylocaine with 0.25% Marcaine with 1 to 200,000 units of epinephrine was utilized and well-tolerated.  The area was cleaned again with ChloraPrep and draped.  The old incision was opened.  Previously placed transfixion sutures of Prolene removed.  The port was extracted with an intact tip.  The deep tissue was approximated with a running 3-0 Vicryl suture.  The skin was closed with a running 3-0 Vicryl subcuticular suture.  Benzoin and Steri-Strips followed by Telfa and Tegaderm dressing applied.  Ice pack provided.  Assessment    Successful PowerPort removal without incident.    Plan    Patient was instructed to use ice intermittently for the next 24 hours.  The outer plastic dressing may be  removed in 3 days.  She will follow-up on an as-needed basis.     HPI, Physical Exam, Assessment and Plan have been scribed under the direction and in the presence of Robert Bellow, MD. Karie Fetch, RN  I have completed the exam and  reviewed the above documentation for accuracy and completeness.  I agree with the above.  Haematologist has been used and any errors in dictation or transcription are unintentional.  Hervey Ard, M.D., F.A.C.S.  Kristen Barron 07/22/2018, 10:16 AM  Patient will need to restart Plavix on Monday, 07-25-18.  Dominga Ferry, CMA

## 2018-07-21 NOTE — Patient Instructions (Signed)
May shower May remove dressing in 2-3 days Steri strips will gradually come off over 2-3 weeks May use an Ice pack as needed for comfort  

## 2018-09-06 ENCOUNTER — Ambulatory Visit: Payer: Medicare HMO | Admitting: Family

## 2018-09-06 ENCOUNTER — Encounter: Payer: Self-pay | Admitting: Family

## 2018-09-06 ENCOUNTER — Emergency Department
Admission: EM | Admit: 2018-09-06 | Discharge: 2018-09-06 | Disposition: A | Discharge status: Home / Self Care | Payer: Medicare HMO | Attending: Emergency Medicine | Admitting: Emergency Medicine

## 2018-09-06 ENCOUNTER — Emergency Department: Payer: Medicare HMO

## 2018-09-06 ENCOUNTER — Encounter: Payer: Self-pay | Admitting: Emergency Medicine

## 2018-09-06 ENCOUNTER — Other Ambulatory Visit: Payer: Self-pay

## 2018-09-06 VITALS — BP 147/45 | HR 71 | Resp 18 | Ht 61.0 in | Wt 180.2 lb

## 2018-09-06 DIAGNOSIS — I5031 Acute diastolic (congestive) heart failure: Secondary | ICD-10-CM | POA: Insufficient documentation

## 2018-09-06 DIAGNOSIS — Z9221 Personal history of antineoplastic chemotherapy: Secondary | ICD-10-CM

## 2018-09-06 DIAGNOSIS — Z79899 Other long term (current) drug therapy: Secondary | ICD-10-CM | POA: Insufficient documentation

## 2018-09-06 DIAGNOSIS — N183 Chronic kidney disease, stage 3 (moderate): Secondary | ICD-10-CM | POA: Diagnosis not present

## 2018-09-06 DIAGNOSIS — Z7902 Long term (current) use of antithrombotics/antiplatelets: Secondary | ICD-10-CM

## 2018-09-06 DIAGNOSIS — I890 Lymphedema, not elsewhere classified: Secondary | ICD-10-CM

## 2018-09-06 DIAGNOSIS — R0602 Shortness of breath: Secondary | ICD-10-CM

## 2018-09-06 DIAGNOSIS — IMO0001 Reserved for inherently not codable concepts without codable children: Secondary | ICD-10-CM

## 2018-09-06 DIAGNOSIS — Z800 Family history of malignant neoplasm of digestive organs: Secondary | ICD-10-CM

## 2018-09-06 DIAGNOSIS — Z853 Personal history of malignant neoplasm of breast: Secondary | ICD-10-CM | POA: Insufficient documentation

## 2018-09-06 DIAGNOSIS — I130 Hypertensive heart and chronic kidney disease with heart failure and stage 1 through stage 4 chronic kidney disease, or unspecified chronic kidney disease: Secondary | ICD-10-CM

## 2018-09-06 DIAGNOSIS — N189 Chronic kidney disease, unspecified: Secondary | ICD-10-CM

## 2018-09-06 DIAGNOSIS — I110 Hypertensive heart disease with heart failure: Secondary | ICD-10-CM | POA: Insufficient documentation

## 2018-09-06 DIAGNOSIS — Z8673 Personal history of transient ischemic attack (TIA), and cerebral infarction without residual deficits: Secondary | ICD-10-CM | POA: Insufficient documentation

## 2018-09-06 DIAGNOSIS — G8929 Other chronic pain: Secondary | ICD-10-CM

## 2018-09-06 DIAGNOSIS — Z8051 Family history of malignant neoplasm of kidney: Secondary | ICD-10-CM

## 2018-09-06 DIAGNOSIS — M1990 Unspecified osteoarthritis, unspecified site: Secondary | ICD-10-CM

## 2018-09-06 DIAGNOSIS — Z794 Long term (current) use of insulin: Secondary | ICD-10-CM

## 2018-09-06 DIAGNOSIS — E1122 Type 2 diabetes mellitus with diabetic chronic kidney disease: Secondary | ICD-10-CM | POA: Insufficient documentation

## 2018-09-06 DIAGNOSIS — Z8249 Family history of ischemic heart disease and other diseases of the circulatory system: Secondary | ICD-10-CM

## 2018-09-06 DIAGNOSIS — I10 Essential (primary) hypertension: Secondary | ICD-10-CM

## 2018-09-06 DIAGNOSIS — Z803 Family history of malignant neoplasm of breast: Secondary | ICD-10-CM | POA: Insufficient documentation

## 2018-09-06 DIAGNOSIS — N390 Urinary tract infection, site not specified: Secondary | ICD-10-CM | POA: Diagnosis not present

## 2018-09-06 DIAGNOSIS — I5033 Acute on chronic diastolic (congestive) heart failure: Secondary | ICD-10-CM | POA: Insufficient documentation

## 2018-09-06 DIAGNOSIS — R300 Dysuria: Secondary | ICD-10-CM

## 2018-09-06 DIAGNOSIS — I509 Heart failure, unspecified: Secondary | ICD-10-CM | POA: Insufficient documentation

## 2018-09-06 DIAGNOSIS — Z86718 Personal history of other venous thrombosis and embolism: Secondary | ICD-10-CM

## 2018-09-06 DIAGNOSIS — Z7901 Long term (current) use of anticoagulants: Secondary | ICD-10-CM | POA: Diagnosis not present

## 2018-09-06 DIAGNOSIS — E119 Type 2 diabetes mellitus without complications: Secondary | ICD-10-CM

## 2018-09-06 LAB — COMPREHENSIVE METABOLIC PANEL
ALK PHOS: 74 U/L (ref 38–126)
ALT: 10 U/L (ref 0–44)
ANION GAP: 6 (ref 5–15)
AST: 16 U/L (ref 15–41)
Albumin: 3.4 g/dL — ABNORMAL LOW (ref 3.5–5.0)
BUN: 19 mg/dL (ref 8–23)
CALCIUM: 8.1 mg/dL — AB (ref 8.9–10.3)
CO2: 22 mmol/L (ref 22–32)
Chloride: 104 mmol/L (ref 98–111)
Creatinine, Ser: 1.2 mg/dL — ABNORMAL HIGH (ref 0.44–1.00)
GFR calc non Af Amer: 41 mL/min — ABNORMAL LOW (ref >60)
GFR, EST AFRICAN AMERICAN: 47 mL/min — AB (ref >60)
Glucose, Bld: 141 mg/dL — ABNORMAL HIGH (ref 70–99)
Potassium: 5 mmol/L (ref 3.5–5.1)
SODIUM: 132 mmol/L — AB (ref 135–145)
TOTAL PROTEIN: 6.4 g/dL — AB (ref 6.5–8.1)
Total Bilirubin: 0.4 mg/dL (ref 0.3–1.2)

## 2018-09-06 LAB — URINALYSIS, COMPLETE (UACMP) WITH MICROSCOPIC
Bilirubin Urine: NEGATIVE
Glucose, UA: NEGATIVE mg/dL
Hgb urine dipstick: NEGATIVE
Ketones, ur: NEGATIVE mg/dL
NITRITE: NEGATIVE
Protein, ur: NEGATIVE mg/dL
Specific Gravity, Urine: 1.005 (ref 1.005–1.030)
WBC, UA: >50 WBC/hpf — ABNORMAL HIGH (ref 0–5)
pH: 5 (ref 5.0–8.0)

## 2018-09-06 LAB — CBC
HCT: 27.1 % — ABNORMAL LOW (ref 36.0–46.0)
HEMOGLOBIN: 8.4 g/dL — AB (ref 12.0–15.0)
MCH: 28.4 pg (ref 26.0–34.0)
MCHC: 31 g/dL (ref 30.0–36.0)
MCV: 91.6 fL (ref 80.0–100.0)
PLATELETS: 236 10*3/uL (ref 150–400)
RBC: 2.96 MIL/uL — ABNORMAL LOW (ref 3.87–5.11)
RDW: 14.6 % (ref 11.5–15.5)
WBC: 5 10*3/uL (ref 4.0–10.5)
nRBC: 0 % (ref 0.0–0.2)

## 2018-09-06 LAB — LIPASE, BLOOD: Lipase: 21 U/L (ref 11–51)

## 2018-09-06 MED ORDER — CEPHALEXIN 500 MG PO CAPS: 500.0000 mg | ORAL_CAPSULE | Freq: Two times a day (BID) | ORAL | 0 refills | Status: DC

## 2018-09-06 NOTE — Patient Instructions (Addendum)
Resume weighing daily and call for an overnight weight gain of > 2 pounds or a weekly weight gain of >5 pounds.  For the next 3 days, take an extra fluid pill daily.

## 2018-09-06 NOTE — ED Triage Notes (Signed)
Patient presents to ED via POV from home with c/o dysuria x 1 month. Patient reports "it hurts once I'm finished peeing". History of UTI's. Denies nausea, vomiting or diarrhea.

## 2018-09-06 NOTE — Progress Notes (Addendum)
Patient ID: Kristen Barron, female    DOB: 02/18/1929, 85 y.o.   MRN: [id number]  HPI  Kristen Barron is a 85 y/o female with a history of breast cancer, DM, HTN, CKD, stroke, DVT and chronic heart failure.   Echo report from 12/10/17 reviewed and showed an EF of 50-55%.  Has not been admitted or been in the ED in the last 6 months.    She presents today for an urgent visit with a chief complaint of moderate shortness of breath upon little exertion. She has associated fatigue, pedal edema, dysuria, chronic back pain and chronic difficulty sleeping along with this. She denies any dizziness, abdominal distention, palpitations, chest pain or weight gain. Granddaughter has noticed some confusion and worsening shortness of breath in the patient over the last few days. . She has an appointment to see her PCP later today for her urinary symptoms. She is not weighing herself daily.   Past Medical History:  Diagnosis Date  . Arthritis   . Breast cancer ([place]) 03/06/2014   right breast with mastectomy and chemo  . Cancer [facility])    Reports lumpectomy for a cyst  . CHF (congestive heart failure) ([place])   . Collagen vascular disease ([place])   . Diabetes ([place])   . DVT (deep venous thrombosis) ([place])   . Hemorrhoids   . Hypertension   . Renal disorder   . Renal insufficiency   . Stroke [facility])    August 2014, but has had strokes prior as well   Past Surgical History:  Procedure Laterality Date  . ABDOMINAL HYSTERECTOMY     Patient not clear as to why  . BACK SURGERY    . BREAST BIOPSY Right February 15 2014   invasive mammary carcinoma/ER/PR negative, HER 2 positive  . BREAST BIOPSY Right 1999   negative biopsy  . BREAST SURGERY Right 03/06/14   mastectomy  . COLONOSCOPY WITH PROPOFOL N/A 04/15/2016   Procedure: COLONOSCOPY WITH PROPOFOL;  Surgeon: Manya Silvas, MD;  Location: [facility] ENDOSCOPY;  Service: Endoscopy;  Laterality: N/A;  . ESOPHAGOGASTRODUODENOSCOPY (EGD) WITH PROPOFOL N/A 04/15/2016   Procedure:  ESOPHAGOGASTRODUODENOSCOPY (EGD) WITH PROPOFOL;  Surgeon: Manya Silvas, MD;  Location: [facility] ENDOSCOPY;  Service: Endoscopy;  Laterality: N/A;  . HAND SURGERY Right    Carpel tunnel release in the 1970s  . LUMBAR LAMINECTOMY/DECOMPRESSION MICRODISCECTOMY Bilateral 07/24/2013   Procedure: LUMBAR LAMINECTOMY/DECOMPRESSION MICRODISCECTOMY LUMBAR THREE-FOUR;  Surgeon: Charlie Pitter, MD;  Location: [place] NEURO ORS;  Service: Neurosurgery;  Laterality: Bilateral;  . MASTECTOMY Right 03/06/2014   right br ca. Dr Jamal Collin  . OOPHORECTOMY     Family History  Problem Relation Age of Onset  . CAD Father   . Cancer Sister 75       breast  . Breast cancer Sister 26  . Cancer Brother        kidney  . Cancer Maternal Aunt        breast  . Breast cancer Maternal Aunt 70  . Cancer Other        maternal niece with breast cancer  . Breast cancer Other   . Cancer Sister        pancreatic   Social History   Tobacco Use  . Smoking status: Never Smoker  . Smokeless tobacco: Never Used  Substance Use Topics  . Alcohol use: No   No Known Allergies  Prior to Admission medications   Medication Sig Start Date End Date Taking? Authorizing Provider  benazepril (LOTENSIN) 40 MG tablet Take 1 tablet (40 mg total) by mouth daily. 12/12/17  Yes Gouru, Illene Silver, MD  clopidogrel (PLAVIX) 75 MG tablet Take 75 mg by mouth daily with breakfast.   Yes [provider]  dicyclomine (BENTYL) 10 MG capsule Take 10 mg by mouth 4 (four) times daily as needed.  05/16/18  Yes [provider]  gabapentin (NEURONTIN) 600 MG tablet Take 600 mg by mouth 4 (four) times daily.  11/25/16  Yes [provider]  Glucosamine 500 MG CAPS Take 500 mg by mouth 3 (three) times daily.   Yes [provider]  hydrALAZINE (APRESOLINE) 50 MG tablet Take 50 mg by mouth 3 (three) times daily.    Yes [provider]  insulin aspart (NOVOLOG) 100 UNIT/ML injection Inject 4 Units into the skin 3 (three) times  daily before meals. Patient is only taking with biggest meal   Yes [provider]  insulin detemir (LEVEMIR) 100 UNIT/ML injection Inject 18 Units into the skin at bedtime.   Yes [provider]  magnesium oxide (MAG-OX) 400 MG tablet Take 400 mg by mouth 2 (two) times daily.   Yes [provider]  metoprolol succinate (TOPROL-XL) 100 MG 24 hr tablet Take 1 tablet (100 mg total) by mouth daily. Take with or immediately following a meal. 12/12/17  Yes Gouru, Aruna, MD  NORCO 5-325 MG tablet Take 1 tablet by mouth every 6 (six) hours as needed (back pain).  10/19/17  Yes [provider]  Omega 3 1200 MG CAPS Take 1 capsule by mouth daily.    Yes [provider]  ondansetron (ZOFRAN-ODT) 4 MG disintegrating tablet Take 4 mg by mouth every 8 (eight) hours as needed for nausea or vomiting.   Yes [provider]  pantoprazole (PROTONIX) 40 MG tablet Take 40 mg by mouth daily.   Yes [provider]  pravastatin (PRAVACHOL) 80 MG tablet Take 80 mg by mouth every evening.    Yes [provider]  torsemide (DEMADEX) 20 MG tablet Take 40 mg by mouth daily.   Yes [provider]    Review of Systems  Constitutional: Positive for fatigue. Negative for appetite change.  HENT: Positive for hearing loss. Negative for congestion, postnasal drip and sore throat.   Eyes: Negative.   Respiratory: Positive for shortness of breath (with minimal exertion). Negative for chest tightness.   Cardiovascular: Positive for leg swelling. Negative for chest pain and palpitations.  Gastrointestinal: Negative for abdominal distention and abdominal pain.  Endocrine: Negative.   Genitourinary: Positive for dysuria. Negative for hematuria.  Musculoskeletal: Positive for arthralgias (legs) and back pain (right lower back).  Skin: Negative.   Allergic/Immunologic: Negative.   Neurological: Negative for dizziness and light-headedness.  Hematological:  Negative for adenopathy. Bruises/bleeds easily.  Psychiatric/Behavioral: Positive for sleep disturbance (waking up frequently). Negative for dysphoric mood. The patient is not nervous/anxious.    Vitals:   09/06/18 1058  BP: (!) 147/45  Pulse: 71  Resp: 18  SpO2: 96%  Weight: 180 lb 4 oz (81.8 kg)  Height: 5\' 1"  (1.549 m)   Wt Readings from Last 3 Encounters:  09/06/18 180 lb 4 oz (81.8 kg)  07/21/18 173 lb (78.5 kg)  06/21/18 173 lb 8 oz (78.7 kg)   Lab Results  Component Value Date   CREATININE 1.37 (H) 12/11/2017   CREATININE 1.10 (H) 12/09/2017   CREATININE 1.43 (H) 08/08/2017    Physical Exam Vitals signs and nursing note reviewed.  Constitutional:      Appearance: She is well-developed.  HENT:     Head: Normocephalic and atraumatic.  Neck:     Musculoskeletal: Normal range of motion and neck supple.     Vascular: No JVD.  Cardiovascular:     Rate and Rhythm: Normal rate and regular rhythm.  Pulmonary:     Effort: Pulmonary effort is normal. No respiratory distress.     Breath sounds: Examination of the right-lower field reveals rales. Examination of the left-lower field reveals rales. Rales present. No wheezing.     Comments: Few rales in lower lobes Abdominal:     General: There is no distension.     Palpations: Abdomen is soft.  Musculoskeletal:     Right lower leg: Edema (trace pitting edema) present.     Left lower leg: Edema (trace pitting edema) present.  Skin:    General: Skin is warm and dry.  Neurological:     Mental Status: She is alert and oriented to person, place, and time.  Psychiatric:        Behavior: Behavior normal.    Assessment & Plan:  1: Acute on Chronic heart failure with preserved ejection fraction- - NYHA class III - minimally fluid overloaded with few rales heard in lower lobes - not weighing daily and she was encouraged to resume weighing daily and to call for an overnight weight gain of >2 pounds or a weekly weight gain of >5  pounds - she was instructed to take an additional 20mg  torsemide daily for the next 3 days (total would now be 60mg  daily for 3 days) & then she is to resume taking her 40mg  daily - weight unchanged from last visit here 6 months ago - not adding salt but says that she hasn't been reading food labels much. Reviewed the importance of reading food labels so that she can closely follow a 2000mg  sodium diet.  - BNP 12/09/17 was 424.0 - has received flu vaccine for this season  2: HTN- - BP looks good today - sees PCP Posey Pronto) later today - BMP 12/11/17 reviewed and showed sodium 134, potassium 4.1 and GFR 34  3: Diabetes-  - glucose at home yesterday was 166  - A1c 12/09/17 was 5.7%  4: Lymphedema- - stage 2 - limited in exercise due to instability and use of walker - encouraged her to elevate her legs when sitting for long periods of time - encouraged her to get compression socks and put them on every morning with removal at bedtime  5: Dysuria- - reports dysuria and burning; no hematuria - daughter feels like patient has some confusion although the patient doesn't notice it - she sees PCP later today and will get her urine checked at that time  Patient did not bring her medications nor a list. Each medication was verbally reviewed with the patient and she was encouraged to bring the bottles to every visit to confirm accuracy of list.  Return in 2 weeks or sooner for any questions/problems before then.

## 2018-09-06 NOTE — ED Notes (Signed)

## 2018-09-06 NOTE — ED Provider Notes (Signed)
[facility] Emergency Department Provider Note   ____________________________________________    I have reviewed the triage vital signs and the nursing notes.   HISTORY  Chief Complaint Dysuria     HPI Kristen Barron is a 85 y.o. female who presents with complaints of burning with urination for several weeks, she reports is worsened over the last several days.  She denies fevers or chills.  No flank pain or back pain.  Has not taken anything for this.  Also reports increasing shortness of breath with exertion over the last several months.  Seen at CHF clinic today where they apparently recommended increasing torsemide.  Apparently her PCPs office called her and told her to come to the emergency department although it is not entirely clear why.  Past Medical History:  Diagnosis Date  . Arthritis   . Breast cancer ([place]) 03/06/2014   right breast with mastectomy and chemo  . Cancer [facility])    Reports lumpectomy for a cyst  . CHF (congestive heart failure) ([place])   . Collagen vascular disease ([place])   . Diabetes ([place])   . DVT (deep venous thrombosis) ([place])   . Hemorrhoids   . Hypertension   . Renal disorder   . Renal insufficiency   . Stroke [facility])    August 2014, but has had strokes prior as well    Patient Active Problem List   Diagnosis Date Noted  . Acute on chronic heart failure ([place]) 09/06/2018  . Chronic diastolic heart failure ([place]) 12/15/2017  . Lymphedema 12/15/2017  . Pressure injury of skin 06/08/2017  . Vomiting   . Gallstone   . Osteoarthritis of knee (Bilateral) (L>R) 12/26/2015  . Chronic pain 12/11/2015  . Long term current use of opiate analgesic 12/11/2015  . Encounter for therapeutic drug level monitoring 12/11/2015  . Chronic knee pain (Location of Primary Source of Pain) (Bilateral) (L>R) 12/11/2015  . Long term prescription opiate use 12/11/2015  . Opiate use (20 MME/Day) 12/11/2015  . Chronic hand pain (Location of  Secondary source of pain) (Bilateral) (L>R) 12/11/2015  . Failed back surgical syndrome x 2 (Surgeon: Dr. Deri Fuelling) 12/11/2015  . Lumbar spondylosis 12/11/2015  . Chronic low back pain (Location of Tertiary source of pain) (Bilateral) (L>R) 12/11/2015  . Lumbar facet syndrome 12/11/2015  . Chronic lower extremity pain (Bilateral) (L>R) 12/11/2015  . Chronic neck pain (Left) 12/11/2015  . Cervical spondylosis 12/11/2015  . Chronic shoulder pain (Right) 12/11/2015  . Insulin dependent diabetes mellitus ([place]) 12/11/2015  . Abnormal MRI, lumbar spine (07/19/2013) 12/11/2015  . Lumbar postlaminectomy syndrome (L4-5) 12/11/2015  . Fusion of lumbar spine (L4-5 Ray cages) 12/11/2015  . Lumbar Levoscoliosis with apex at L4 12/11/2015  . Grade 1 Retrolisthesis of L2 over L3 and L3 over L4 12/11/2015  . Lumbar facet arthropathy 12/11/2015  . Lumbar foraminal stenosis (Bilateral L2-3, L3-4 & Left L5-S1) 12/11/2015  . Chronic anticoagulation (Plavix) 12/11/2015  . Breast cancer of lower-outer quadrant of right female breast ([place]) 02/28/2014  . Lumbar stenosis with neurogenic claudication 07/24/2013  . Lumbar central spinal stenosis ( Severe at L3-4) (Mild R>L L2-3 & L5-S1) 07/20/2013  . Hyponatremia 07/20/2013  . Hypertension 07/20/2013  . Normocytic anemia 07/20/2013  . CKD (chronic kidney disease) stage 3, GFR 30-59 ml/min (HCC) 07/20/2013  .  Lumbar DDD (degenerative disc disease) 07/20/2013    Past Surgical History:  Procedure Laterality Date  . ABDOMINAL HYSTERECTOMY     Patient not clear as  to why  . BACK SURGERY    . BREAST BIOPSY Right February 15 2014   invasive mammary carcinoma/ER/PR negative, HER 2 positive  . BREAST BIOPSY Right 1999   negative biopsy  . BREAST SURGERY Right 03/06/14   mastectomy  . COLONOSCOPY WITH PROPOFOL N/A 04/15/2016   Procedure: COLONOSCOPY WITH PROPOFOL;  Surgeon: Manya Silvas, MD;  Location: [facility] ENDOSCOPY;  Service: Endoscopy;  Laterality: N/A;  .  ESOPHAGOGASTRODUODENOSCOPY (EGD) WITH PROPOFOL N/A 04/15/2016   Procedure: ESOPHAGOGASTRODUODENOSCOPY (EGD) WITH PROPOFOL;  Surgeon: Manya Silvas, MD;  Location: [facility] ENDOSCOPY;  Service: Endoscopy;  Laterality: N/A;  . HAND SURGERY Right    Carpel tunnel release in the 1970s  . LUMBAR LAMINECTOMY/DECOMPRESSION MICRODISCECTOMY Bilateral 07/24/2013   Procedure: LUMBAR LAMINECTOMY/DECOMPRESSION MICRODISCECTOMY LUMBAR THREE-FOUR;  Surgeon: Charlie Pitter, MD;  Location: [place] NEURO ORS;  Service: Neurosurgery;  Laterality: Bilateral;  . MASTECTOMY Right 03/06/2014   right br ca. Dr Jamal Collin  . OOPHORECTOMY      Prior to Admission medications   Medication Sig Start Date End Date Taking? Authorizing Provider  benazepril (LOTENSIN) 40 MG tablet Take 1 tablet (40 mg total) by mouth daily. 12/12/17   Gouru, Illene Silver, MD  cephALEXin (KEFLEX) 500 MG capsule Take 1 capsule (500 mg total) by mouth 2 (two) times daily. 09/06/18   Lavonia Drafts, MD  clopidogrel (PLAVIX) 75 MG tablet Take 75 mg by mouth daily with breakfast.    [provider]  dicyclomine (BENTYL) 10 MG capsule Take 10 mg by mouth 4 (four) times daily as needed.  05/16/18   [provider]  gabapentin (NEURONTIN) 600 MG tablet Take 600 mg by mouth 4 (four) times daily.  11/25/16   [provider]  Glucosamine 500 MG CAPS Take 500 mg by mouth 3 (three) times daily.    [provider]  hydrALAZINE (APRESOLINE) 50 MG tablet Take 50 mg by mouth 3 (three) times daily.     [provider]  insulin aspart (NOVOLOG) 100 UNIT/ML injection Inject 4 Units into the skin 3 (three) times daily before meals. Patient is only taking with biggest meal    [provider]  insulin detemir (LEVEMIR) 100 UNIT/ML injection Inject 18 Units into the skin at bedtime.    [provider]  magnesium oxide (MAG-OX) 400 MG tablet Take 400 mg by mouth 2 (two) times daily.    [provider]  metoprolol succinate  (TOPROL-XL) 100 MG 24 hr tablet Take 1 tablet (100 mg total) by mouth daily. Take with or immediately following a meal. 12/12/17   Gouru, Aruna, MD  NORCO 5-325 MG tablet Take 1 tablet by mouth every 6 (six) hours as needed (back pain).  10/19/17   [provider]  Omega 3 1200 MG CAPS Take 1 capsule by mouth daily.     [provider]  ondansetron (ZOFRAN-ODT) 4 MG disintegrating tablet Take 4 mg by mouth every 8 (eight) hours as needed for nausea or vomiting.    [provider]  pantoprazole (PROTONIX) 40 MG tablet Take 40 mg by mouth daily.    [provider]  pravastatin (PRAVACHOL) 80 MG tablet Take 80 mg by mouth every evening.     [provider]  torsemide (DEMADEX) 20 MG tablet Take 40 mg by mouth daily.    [provider]     Allergies Patient has no known allergies.  Family History  Problem Relation Age of Onset  . CAD Father   .  Cancer Sister 63       breast  . Breast cancer Sister 51  . Cancer Brother        kidney  . Cancer Maternal Aunt        breast  . Breast cancer Maternal Aunt 70  . Cancer Other        maternal niece with breast cancer  . Breast cancer Other   . Cancer Sister        pancreatic    Social History Social History   Tobacco Use  . Smoking status: Never Smoker  . Smokeless tobacco: Never Used  Substance Use Topics  . Alcohol use: No  . Drug use: No    Review of Systems  Constitutional: No fever/chills Eyes: No visual changes.  ENT: No sore throat. Cardiovascular: Denies chest pain. Respiratory: As above Gastrointestinal: No abdominal pain.  No nausea, no vomiting.   Genitourinary: As above Musculoskeletal: Negative for back pain. Skin: Negative for rash. Neurological: Negative for headaches or weakness   ____________________________________________   PHYSICAL EXAM:  VITAL SIGNS: ED Triage Vitals [09/06/18 1229]  Enc Vitals Group     BP (!) 157/49     Pulse Rate (!) 55      Resp 17     Temp 98.4 F (36.9 C)     Temp Source Oral     SpO2 95 %     Weight 81.6 kg (180 lb)     Height 1.549 m (5\' 1" )     Head Circumference      Peak Flow      Pain Score 8     Pain Loc      Pain Edu?      Excl. in [place]?     Constitutional: Alert and oriented. No acute distress. Pleasant and interactive Eyes: Conjunctivae are normal.    Mouth/Throat: Mucous membranes are moist.    Cardiovascular: Normal rate, regular rhythm. Grossly normal heart sounds.  Good peripheral circulation. Respiratory: Normal respiratory effort.  No retractions. Lungs CTAB. Gastrointestinal: Soft and nontender. No distention.  No CVA tenderness.  Musculoskeletal:  Warm and well perfused Neurologic:  Normal speech and language. No gross focal neurologic deficits are appreciated.  Skin:  Skin is warm, dry and intact. No rash noted. Psychiatric: Mood and affect are normal. Speech and behavior are normal.  ____________________________________________   LABS (all labs ordered are listed, but only abnormal results are displayed)  Labs Reviewed  COMPREHENSIVE METABOLIC PANEL - Abnormal; Notable for the following components:      Result Value   Sodium 132 (*)    Glucose, Bld 141 (*)    Creatinine, Ser 1.20 (*)    Calcium 8.1 (*)    Total Protein 6.4 (*)    Albumin 3.4 (*)    GFR calc non Af Amer 41 (*)    GFR calc Af Amer 47 (*)    All other components within normal limits  CBC - Abnormal; Notable for the following components:   RBC 2.96 (*)    Hemoglobin 8.4 (*)    HCT 27.1 (*)    All other components within normal limits  URINALYSIS, COMPLETE (UACMP) WITH MICROSCOPIC - Abnormal; Notable for the following components:   Color, Urine STRAW (*)    APPearance HAZY (*)    Leukocytes, UA MODERATE (*)    WBC, UA >50 (*)    Bacteria, UA RARE (*)    All other components within normal limits  LIPASE, BLOOD  ____________________________________________  EKG  None ____________________________________________  RADIOLOGY  Chest x-ray with pulmonary vascular congestion, no overt edema ____________________________________________   PROCEDURES  Procedure(s) performed: No  Procedures   Critical Care performed: No ____________________________________________   INITIAL IMPRESSION / ASSESSMENT AND PLAN / ED COURSE  Pertinent labs & imaging results that were available during my care of the patient were reviewed by me and considered in my medical decision making (see chart for details).  Patient's primary complaint is dysuria and in fact her urinalysis consistent with UTI, we will treat with Keflex.  There also is some confusion about her shortness of breath, apparently her PCPs office referred her to the emergency department however they may not of note the patient was at CHF clinic today where they recommended increasing torsemide and did not recommend come to the emergency department.  Regardless patient is well-appearing and in no acute distress, chest x-ray does not show any overt edema appropriate for discharge at this time    ____________________________________________   FINAL CLINICAL IMPRESSION(S) / ED DIAGNOSES  Final diagnoses:  Lower urinary tract infectious disease  SOB (shortness of breath)        Note:  This document was prepared using Dragon voice recognition software and may include unintentional dictation errors.   Lavonia Drafts, MD 09/06/18 [phone]

## 2018-09-14 ENCOUNTER — Other Ambulatory Visit: Payer: Self-pay

## 2018-09-14 ENCOUNTER — Emergency Department: Payer: Medicare HMO

## 2018-09-14 ENCOUNTER — Encounter: Payer: Self-pay | Admitting: Emergency Medicine

## 2018-09-14 ENCOUNTER — Observation Stay
Admission: EM | Admit: 2018-09-14 | Discharge: 2018-09-16 | Disposition: A | Discharge status: Home / Self Care | Payer: Medicare HMO | Attending: Internal Medicine | Admitting: Internal Medicine

## 2018-09-14 DIAGNOSIS — G319 Degenerative disease of nervous system, unspecified: Secondary | ICD-10-CM | POA: Insufficient documentation

## 2018-09-14 DIAGNOSIS — L989 Disorder of the skin and subcutaneous tissue, unspecified: Secondary | ICD-10-CM | POA: Insufficient documentation

## 2018-09-14 DIAGNOSIS — Z86718 Personal history of other venous thrombosis and embolism: Secondary | ICD-10-CM | POA: Insufficient documentation

## 2018-09-14 DIAGNOSIS — R2689 Other abnormalities of gait and mobility: Secondary | ICD-10-CM | POA: Insufficient documentation

## 2018-09-14 DIAGNOSIS — M25562 Pain in left knee: Secondary | ICD-10-CM | POA: Diagnosis not present

## 2018-09-14 DIAGNOSIS — E860 Dehydration: Secondary | ICD-10-CM | POA: Diagnosis not present

## 2018-09-14 DIAGNOSIS — I5032 Chronic diastolic (congestive) heart failure: Secondary | ICD-10-CM | POA: Diagnosis not present

## 2018-09-14 DIAGNOSIS — I160 Hypertensive urgency: Secondary | ICD-10-CM | POA: Insufficient documentation

## 2018-09-14 DIAGNOSIS — M6281 Muscle weakness (generalized): Secondary | ICD-10-CM | POA: Diagnosis not present

## 2018-09-14 DIAGNOSIS — Z8673 Personal history of transient ischemic attack (TIA), and cerebral infarction without residual deficits: Secondary | ICD-10-CM | POA: Diagnosis not present

## 2018-09-14 DIAGNOSIS — R296 Repeated falls: Secondary | ICD-10-CM | POA: Diagnosis not present

## 2018-09-14 DIAGNOSIS — E871 Hypo-osmolality and hyponatremia: Secondary | ICD-10-CM | POA: Diagnosis present

## 2018-09-14 DIAGNOSIS — R55 Syncope and collapse: Secondary | ICD-10-CM | POA: Diagnosis present

## 2018-09-14 DIAGNOSIS — Z7902 Long term (current) use of antithrombotics/antiplatelets: Secondary | ICD-10-CM | POA: Insufficient documentation

## 2018-09-14 DIAGNOSIS — I130 Hypertensive heart and chronic kidney disease with heart failure and stage 1 through stage 4 chronic kidney disease, or unspecified chronic kidney disease: Secondary | ICD-10-CM | POA: Diagnosis not present

## 2018-09-14 DIAGNOSIS — Z66 Do not resuscitate: Secondary | ICD-10-CM | POA: Diagnosis not present

## 2018-09-14 DIAGNOSIS — Z9181 History of falling: Secondary | ICD-10-CM | POA: Insufficient documentation

## 2018-09-14 DIAGNOSIS — N179 Acute kidney failure, unspecified: Secondary | ICD-10-CM | POA: Diagnosis not present

## 2018-09-14 DIAGNOSIS — M25561 Pain in right knee: Secondary | ICD-10-CM | POA: Insufficient documentation

## 2018-09-14 DIAGNOSIS — R197 Diarrhea, unspecified: Secondary | ICD-10-CM | POA: Diagnosis not present

## 2018-09-14 DIAGNOSIS — R9082 White matter disease, unspecified: Secondary | ICD-10-CM | POA: Diagnosis not present

## 2018-09-14 DIAGNOSIS — E1122 Type 2 diabetes mellitus with diabetic chronic kidney disease: Secondary | ICD-10-CM | POA: Diagnosis not present

## 2018-09-14 DIAGNOSIS — Z79899 Other long term (current) drug therapy: Secondary | ICD-10-CM | POA: Insufficient documentation

## 2018-09-14 DIAGNOSIS — N289 Disorder of kidney and ureter, unspecified: Secondary | ICD-10-CM

## 2018-09-14 DIAGNOSIS — N183 Chronic kidney disease, stage 3 (moderate): Secondary | ICD-10-CM | POA: Insufficient documentation

## 2018-09-14 DIAGNOSIS — G9389 Other specified disorders of brain: Secondary | ICD-10-CM | POA: Diagnosis not present

## 2018-09-14 DIAGNOSIS — R2681 Unsteadiness on feet: Secondary | ICD-10-CM | POA: Diagnosis not present

## 2018-09-14 DIAGNOSIS — Z794 Long term (current) use of insulin: Secondary | ICD-10-CM | POA: Insufficient documentation

## 2018-09-14 LAB — URINALYSIS, COMPLETE (UACMP) WITH MICROSCOPIC
Bacteria, UA: NONE SEEN
Bilirubin Urine: NEGATIVE
Glucose, UA: NEGATIVE mg/dL
Hgb urine dipstick: NEGATIVE
KETONES UR: NEGATIVE mg/dL
Leukocytes, UA: NEGATIVE
Nitrite: NEGATIVE
Protein, ur: 30 mg/dL — AB
Specific Gravity, Urine: 1.006 (ref 1.005–1.030)
Squamous Epithelial / HPF: NONE SEEN (ref 0–5)
pH: 6 (ref 5.0–8.0)

## 2018-09-14 LAB — CBC WITH DIFFERENTIAL/PLATELET
ABS IMMATURE GRANULOCYTES: 0.02 10*3/uL (ref 0.00–0.07)
Basophils Absolute: 0 10*3/uL (ref 0.0–0.1)
Basophils Relative: 1 %
Eosinophils Absolute: 0.1 10*3/uL (ref 0.0–0.5)
Eosinophils Relative: 2 %
HEMATOCRIT: 28.3 % — AB (ref 36.0–46.0)
Hemoglobin: 8.9 g/dL — ABNORMAL LOW (ref 12.0–15.0)
Immature Granulocytes: 1 %
LYMPHS ABS: 1 10*3/uL (ref 0.7–4.0)
Lymphocytes Relative: 30 %
MCH: 28.3 pg (ref 26.0–34.0)
MCHC: 31.4 g/dL (ref 30.0–36.0)
MCV: 89.8 fL (ref 80.0–100.0)
Monocytes Absolute: 0.4 10*3/uL (ref 0.1–1.0)
Monocytes Relative: 10 %
Neutro Abs: 2 10*3/uL (ref 1.7–7.7)
Neutrophils Relative %: 56 %
Platelets: 257 10*3/uL (ref 150–400)
RBC: 3.15 MIL/uL — ABNORMAL LOW (ref 3.87–5.11)
RDW: 14.1 % (ref 11.5–15.5)
WBC: 3.5 10*3/uL — ABNORMAL LOW (ref 4.0–10.5)
nRBC: 0 % (ref 0.0–0.2)

## 2018-09-14 LAB — LACTIC ACID, PLASMA
Lactic Acid, Venous: 1 mmol/L (ref 0.5–1.9)
Lactic Acid, Venous: 1 mmol/L (ref 0.5–1.9)

## 2018-09-14 LAB — COMPREHENSIVE METABOLIC PANEL
ALK PHOS: 53 U/L (ref 38–126)
ALT: 12 U/L (ref 0–44)
AST: 22 U/L (ref 15–41)
Albumin: 3.2 g/dL — ABNORMAL LOW (ref 3.5–5.0)
Anion gap: 9 (ref 5–15)
BUN: 27 mg/dL — ABNORMAL HIGH (ref 8–23)
CO2: 23 mmol/L (ref 22–32)
Calcium: 7.8 mg/dL — ABNORMAL LOW (ref 8.9–10.3)
Chloride: 97 mmol/L — ABNORMAL LOW (ref 98–111)
Creatinine, Ser: 1.49 mg/dL — ABNORMAL HIGH (ref 0.44–1.00)
GFR calc Af Amer: 36 mL/min — ABNORMAL LOW (ref >60)
GFR calc non Af Amer: 31 mL/min — ABNORMAL LOW (ref >60)
Glucose, Bld: 160 mg/dL — ABNORMAL HIGH (ref 70–99)
Potassium: 4 mmol/L (ref 3.5–5.1)
SODIUM: 129 mmol/L — AB (ref 135–145)
Total Bilirubin: 0.4 mg/dL (ref 0.3–1.2)
Total Protein: 6.6 g/dL (ref 6.5–8.1)

## 2018-09-14 LAB — BRAIN NATRIURETIC PEPTIDE: B Natriuretic Peptide: 159 pg/mL — ABNORMAL HIGH (ref 0.0–100.0)

## 2018-09-14 LAB — TROPONIN I: Troponin I: <0.03 ng/mL (ref <0.03)

## 2018-09-14 MED ORDER — IOPAMIDOL (ISOVUE-300) INJECTION 61%
30.0000 mL | Freq: Once | INTRAVENOUS | Status: AC
  Administered 2018-09-14: 30 mL via ORAL

## 2018-09-14 MED ORDER — SODIUM CHLORIDE 0.9 % IV SOLN
Freq: Once | INTRAVENOUS | Status: AC
  Administered 2018-09-14: 19:00:00 via INTRAVENOUS

## 2018-09-14 MED ORDER — METOPROLOL TARTRATE 5 MG/5ML IV SOLN: 5.0000 mg | INTRAVENOUS | Status: DC | PRN

## 2018-09-14 MED ORDER — SODIUM CHLORIDE 0.9 % IV SOLN
INTRAVENOUS | Status: AC
  Administered 2018-09-15: 01:00:00 via INTRAVENOUS

## 2018-09-14 NOTE — ED Notes (Signed)
Patient transported to CT 

## 2018-09-14 NOTE — Progress Notes (Signed)
Family Meeting Note  Advance Directive:yes  Today a meeting took place with the Patient, granddaughter    The following clinical team members were present during this meeting:MD  The following were discussed:Patient's diagnosis: Syncope, profuse diarrhea, acute kidney injury, hypertensive urgency, diabetes mellitus, history of CHF, chronic hyponatremia, treatment plan of care discussed in detail with the patient and her granddaughter at bedside.  They both verbalized understanding of the plan.  , Patient's progosis: Unable to determine and Goals for treatment: DNR  Additional follow-up to be provided: Hospitalist  Time spent during discussion:17 MIN  Nicholes Mango, MD

## 2018-09-14 NOTE — ED Notes (Signed)
Pts daughter pointed out that there was an IV started on her right arm and was angry because the patient had a mastectomy approx 6 years ago. She stated that she had told EMS about it but apparently they hadn't listened. This nurse acknowledged their concerns and started an IV on pts L AC. IV on R forearm was discontinued. Dr Cinda Quest notified.

## 2018-09-14 NOTE — ED Triage Notes (Signed)
PT to ER via EMS from home with reports of syncopal episode while on toilet.  Pt awoke having diarrhea and n/v.  Pt reports diarrhea for last several days.  EMS reports pt originally pale and diaphoretic, pt color has returned and skin warm and dry.

## 2018-09-14 NOTE — H&P (Signed)
[facility] [place] NAME: Kristen Barron    MR#:  [id number]  DATE OF BIRTH:  [birth year]  DATE OF ADMISSION:  09/14/2018  PRIMARY CARE PHYSICIAN: Denton Lank, MD   REQUESTING/REFERRING PHYSICIAN: Nena Polio, MD  CHIEF COMPLAINT:  Loss of consciousness and diarrhea  HISTORY OF PRESENT ILLNESS:  Kristen Barron  is a 85 y.o. female with a known history of CHF, diabetes mellitus, hypertension chronic renal insufficiency chronic hyponatremia is brought into the ED after she sustained a syncopal episode.  According to the granddaughter at bedside she has been having profuse watery diarrhea Patient does not think she hit her head, daughter is concerned she might be having some seizure-like episode renal function is slightly abnormal.  Patient is reporting generalized weakness but denies any chest pain or shortness of breath.  No other complaints  PAST MEDICAL HISTORY:   Past Medical History:  Diagnosis Date  . Arthritis   . Breast cancer ([place]) 03/06/2014   right breast with mastectomy and chemo  . Cancer [facility])    Reports lumpectomy for a cyst  . CHF (congestive heart failure) ([place])   . Collagen vascular disease ([place])   . Diabetes ([place])   . DVT (deep venous thrombosis) ([place])   . Hemorrhoids   . Hypertension   . Renal disorder   . Renal insufficiency   . Stroke [facility])    August 2014, but has had strokes prior as well    PAST SURGICAL HISTOIRY:   Past Surgical History:  Procedure Laterality Date  . ABDOMINAL HYSTERECTOMY     Patient not clear as to why  . BACK SURGERY    . BREAST BIOPSY Right February 15 2014   invasive mammary carcinoma/ER/PR negative, HER 2 positive  . BREAST BIOPSY Right 1999   negative biopsy  . BREAST SURGERY Right 03/06/14   mastectomy  . COLONOSCOPY WITH PROPOFOL N/A 04/15/2016   Procedure: COLONOSCOPY WITH PROPOFOL;  Surgeon: Manya Silvas, MD;  Location: [facility] ENDOSCOPY;  Service: Endoscopy;  Laterality:  N/A;  . ESOPHAGOGASTRODUODENOSCOPY (EGD) WITH PROPOFOL N/A 04/15/2016   Procedure: ESOPHAGOGASTRODUODENOSCOPY (EGD) WITH PROPOFOL;  Surgeon: Manya Silvas, MD;  Location: [facility] ENDOSCOPY;  Service: Endoscopy;  Laterality: N/A;  . HAND SURGERY Right    Carpel tunnel release in the 1970s  . LUMBAR LAMINECTOMY/DECOMPRESSION MICRODISCECTOMY Bilateral 07/24/2013   Procedure: LUMBAR LAMINECTOMY/DECOMPRESSION MICRODISCECTOMY LUMBAR THREE-FOUR;  Surgeon: Charlie Pitter, MD;  Location: [place] NEURO ORS;  Service: Neurosurgery;  Laterality: Bilateral;  . MASTECTOMY Right 03/06/2014   right br ca. Dr Jamal Collin  . OOPHORECTOMY      SOCIAL HISTORY:   Social History   Tobacco Use  . Smoking status: Never Smoker  . Smokeless tobacco: Never Used  Substance Use Topics  . Alcohol use: No    FAMILY HISTORY:   Family History  Problem Relation Age of Onset  . CAD Father   . Cancer Sister 17       breast  . Breast cancer Sister 12  . Cancer Brother        kidney  . Cancer Maternal Aunt        breast  . Breast cancer Maternal Aunt 70  . Cancer Other        maternal niece with breast cancer  . Breast cancer Other   . Cancer Sister        pancreatic    DRUG ALLERGIES:  No Known Allergies  REVIEW  OF SYSTEMS:  CONSTITUTIONAL: No fever, fatigue, reports generalized weakness.  EYES: No blurred or double vision.  EARS, NOSE, AND THROAT: No tinnitus or ear pain.  RESPIRATORY: No cough, shortness of breath, wheezing or hemoptysis.  CARDIOVASCULAR: No chest pain, orthopnea, edema.  GASTROINTESTINAL: No nausea, vomiting, reporting profuse diarrhea, denies abdominal pain.  GENITOURINARY: No dysuria, hematuria.  ENDOCRINE: No polyuria, nocturia,  HEMATOLOGY: No anemia, easy bruising or bleeding SKIN: No rash or lesion. MUSCULOSKELETAL: No joint pain or arthritis.   NEUROLOGIC: No tingling, numbness, weakness.  PSYCHIATRY: No anxiety or depression.   MEDICATIONS AT HOME:   Prior to Admission  medications   Medication Sig Start Date End Date Taking? Authorizing Provider  benazepril (LOTENSIN) 40 MG tablet Take 1 tablet (40 mg total) by mouth daily. 12/12/17  Yes Harless Molinari, Illene Silver, MD  cephALEXin (KEFLEX) 500 MG capsule Take 1 capsule (500 mg total) by mouth 2 (two) times daily. 09/06/18  Yes Lavonia Drafts, MD  clopidogrel (PLAVIX) 75 MG tablet Take 75 mg by mouth daily with breakfast.   Yes [provider]  dicyclomine (BENTYL) 10 MG capsule Take 10 mg by mouth 4 (four) times daily as needed.  05/16/18  Yes [provider]  gabapentin (NEURONTIN) 600 MG tablet Take 600 mg by mouth 4 (four) times daily.  11/25/16  Yes [provider]  Glucosamine 500 MG CAPS Take 500 mg by mouth 3 (three) times daily.   Yes [provider]  hydrALAZINE (APRESOLINE) 50 MG tablet Take 50 mg by mouth 3 (three) times daily.    Yes [provider]  insulin aspart (NOVOLOG) 100 UNIT/ML injection Inject 4 Units into the skin 3 (three) times daily before meals. Patient is only taking with biggest meal   Yes [provider]  insulin detemir (LEVEMIR) 100 UNIT/ML injection Inject 18 Units into the skin at bedtime.   Yes [provider]  magnesium oxide (MAG-OX) 400 MG tablet Take 400 mg by mouth 2 (two) times daily.   Yes [provider]  metoprolol succinate (TOPROL-XL) 100 MG 24 hr tablet Take 1 tablet (100 mg total) by mouth daily. Take with or immediately following a meal. 12/12/17  Yes Ivy Puryear, MD  NORCO 5-325 MG tablet Take 1 tablet by mouth every 6 (six) hours as needed (back pain).  10/19/17  Yes [provider]  Omega 3 1200 MG CAPS Take 1 capsule by mouth daily.    Yes [provider]  pantoprazole (PROTONIX) 40 MG tablet Take 40 mg by mouth daily.   Yes [provider]  pravastatin (PRAVACHOL) 80 MG tablet Take 80 mg by mouth every evening.    Yes [provider]  torsemide (DEMADEX) 20 MG tablet Take 40  mg by mouth daily.   Yes [provider]  ondansetron (ZOFRAN-ODT) 4 MG disintegrating tablet Take 4 mg by mouth every 8 (eight) hours as needed for nausea or vomiting.    [provider]      VITAL SIGNS:  Blood pressure (!) 202/67, pulse 63, temperature (!) 97.4 F (36.3 C), temperature source Oral, resp. rate 15, height 5\' 2"  (1.575 m), weight 77.1 kg, SpO2 96 %.  PHYSICAL EXAMINATION:  GENERAL:  85 y.o.-year-old patient lying in the bed with no acute distress.  EYES: Pupils equal, round, reactive to light and accommodation. No scleral icterus. Extraocular muscles intact.  HEENT: Head atraumatic, normocephalic. Oropharynx and nasopharynx clear.  Dry mucous membranes NECK:  Supple, no jugular venous distention. No thyroid  enlargement, no tenderness.  LUNGS: Normal breath sounds bilaterally, no wheezing, rales,rhonchi or crepitation. No use of accessory muscles of respiration.  CARDIOVASCULAR: S1, S2 normal. No murmurs, rubs, or gallops.  ABDOMEN: Soft, nontender, nondistended. Bowel sounds present.  EXTREMITIES: No pedal edema, cyanosis, or clubbing.  NEUROLOGIC: Awake alert and oriented x3 sensation intact. Gait not checked.  PSYCHIATRIC: The patient is alert and oriented x 3.  SKIN: No obvious rash, lesion, or ulcer.   LABORATORY PANEL:   CBC Recent Labs  Lab 09/14/18 1641  WBC 3.5*  HGB 8.9*  HCT 28.3*  PLT 257   ------------------------------------------------------------------------------------------------------------------  Chemistries  Recent Labs  Lab 09/14/18 1641  NA 129*  K 4.0  CL 97*  CO2 23  GLUCOSE 160*  BUN 27*  CREATININE 1.49*  CALCIUM 7.8*  AST 22  ALT 12  ALKPHOS 53  BILITOT 0.4   ------------------------------------------------------------------------------------------------------------------  Cardiac Enzymes Recent Labs  Lab 09/14/18 1641  TROPONINI <0.03    ------------------------------------------------------------------------------------------------------------------  RADIOLOGY:  Ct Abdomen Pelvis Wo Contrast  Result Date: 09/14/2018 CLINICAL DATA:  Syncopal episode on toilet. Diarrhea, nausea and vomiting. History of breast cancer, hysterectomy and hemorrhoids. EXAM: CT ABDOMEN AND PELVIS WITHOUT CONTRAST TECHNIQUE: Multidetector CT imaging of the abdomen and pelvis was performed following the standard protocol without IV contrast. COMPARISON:  CT abdomen and pelvis November 23, 2016 FINDINGS: LOWER CHEST: Mucoid impaction RIGHT > LEFT lower lobe bronchi. Heart is at least mildly enlarged. Severe coronary artery calcifications. No pericardial effusion. Similar nodular thickening RIGHT hemidiaphragm. HEPATOBILIARY: Mild 1 cm gallstone with additional tiny layering gallbladder sludge versus gallstones. No CT findings of acute cholecystitis. Calcified hepatic granulomas. PANCREAS: Fatty atrophy. SPLEEN: Nonacute.  Calcified splenic granulomas. ADRENALS/URINARY TRACT: Kidneys are orthotopic, demonstrating normal size and morphology. No nephrolithiasis, hydronephrosis; limited assessment for renal masses by nonenhanced CT. 16 mm hypodense exophytic RIGHT upper pole cyst. 13 mm RIGHT lower pole cyst. The unopacified ureters are normal in course and caliber. Urinary bladder is well distended and unremarkable. Normal adrenal glands. STOMACH/BOWEL: The stomach, small and large bowel are normal in course and caliber without inflammatory changes. Enteric contrast has not yet reached the distal small bowel. Moderate sigmoid colonic diverticulosis. Small hiatal hernia. The appendix is not discretely identified, however there are no inflammatory changes in the right lower quadrant. VASCULAR/LYMPHATIC: Aortoiliac vessels are normal in course and caliber. Moderate calcific atherosclerosis. No lymphadenopathy by CT size criteria. REPRODUCTIVE: Status post hysterectomy. OTHER:  No intraperitoneal free fluid or free air. Moderate fat containing umbilical hernia. MUSCULOSKELETAL: Non-acute. Osteopenia. Severe lumbar spondylosis, status post L4-5 disc prosthesis with subsidence. IMPRESSION: 1. No acute intra-abdominal/pelvic process. 2. Colonic diverticulosis and cholelithiasis. Aortic Atherosclerosis (ICD10-I70.0). Electronically Signed   By: Elon Alas M.D.   On: 09/14/2018 18:58   Ct Head Wo Contrast  Result Date: 09/14/2018 CLINICAL DATA:  Syncopal episode today. EXAM: CT HEAD WITHOUT CONTRAST CT CERVICAL SPINE WITHOUT CONTRAST TECHNIQUE: Multidetector CT imaging of the head and cervical spine was performed following the standard protocol without intravenous contrast. Multiplanar CT image reconstructions of the cervical spine were also generated. COMPARISON:  Head CT from 2014 and brain MRI 11/25/2016 FINDINGS: CT HEAD FINDINGS Brain: Stable age related cerebral atrophy, ventriculomegaly and periventricular white matter disease. No extra-axial fluid collections are identified. No CT findings for acute hemispheric infarction or intracranial hemorrhage. No mass lesions. The brainstem and cerebellum are normal. Vascular: Stable vascular calcifications but no definite aneurysm or hyperdense vessels. Skull: No skull fracture or bone lesion. Sinuses/Orbits:  Both half sphenoid sinus disease. The other paranasal sinuses are grossly clear. The mastoid air cells and middle ear cavities are clear. The globes are intact. Other: No scalp lesions or hematoma. CT CERVICAL SPINE FINDINGS Alignment: Advanced degenerative cervical spondylosis with multilevel disc disease and facet disease. Mild multilevel degenerative subluxations are noted. Skull base and vertebrae: The skull base C1 and C1-2 articulations are maintained. There are advanced degenerative changes at C1-2 with calcified pannus formation and mild mass effect on the upper cervical thecal sac. No acute fracture is identified. Soft  tissues and spinal canal: No prevertebral fluid or swelling. No visible canal hematoma. Disc levels: Generous spinal canal. There is multilevel bony foraminal narrowing due to uncinate spurring and facet disease. Upper chest: The lung apices are grossly clear. Other: Bilateral carotid artery calcifications are noted along with calcifications of the aortic branch vessels. Small thyroid nodules are noted. No neck mass or adenopathy. IMPRESSION: 1. No acute intracranial findings. No skull fracture. Stable age related cerebral atrophy, ventriculomegaly and periventricular white matter disease. 2. 3. Advanced degenerative cervical spondylosis with multilevel disc disease and facet disease but no acute fracture. Electronically Signed   By: Marijo Sanes M.D.   On: 09/14/2018 17:18   Ct Cervical Spine Wo Contrast  Result Date: 09/14/2018 CLINICAL DATA:  Syncopal episode today. EXAM: CT HEAD WITHOUT CONTRAST CT CERVICAL SPINE WITHOUT CONTRAST TECHNIQUE: Multidetector CT imaging of the head and cervical spine was performed following the standard protocol without intravenous contrast. Multiplanar CT image reconstructions of the cervical spine were also generated. COMPARISON:  Head CT from 2014 and brain MRI 11/25/2016 FINDINGS: CT HEAD FINDINGS Brain: Stable age related cerebral atrophy, ventriculomegaly and periventricular white matter disease. No extra-axial fluid collections are identified. No CT findings for acute hemispheric infarction or intracranial hemorrhage. No mass lesions. The brainstem and cerebellum are normal. Vascular: Stable vascular calcifications but no definite aneurysm or hyperdense vessels. Skull: No skull fracture or bone lesion. Sinuses/Orbits: Both half sphenoid sinus disease. The other paranasal sinuses are grossly clear. The mastoid air cells and middle ear cavities are clear. The globes are intact. Other: No scalp lesions or hematoma. CT CERVICAL SPINE FINDINGS Alignment: Advanced degenerative  cervical spondylosis with multilevel disc disease and facet disease. Mild multilevel degenerative subluxations are noted. Skull base and vertebrae: The skull base C1 and C1-2 articulations are maintained. There are advanced degenerative changes at C1-2 with calcified pannus formation and mild mass effect on the upper cervical thecal sac. No acute fracture is identified. Soft tissues and spinal canal: No prevertebral fluid or swelling. No visible canal hematoma. Disc levels: Generous spinal canal. There is multilevel bony foraminal narrowing due to uncinate spurring and facet disease. Upper chest: The lung apices are grossly clear. Other: Bilateral carotid artery calcifications are noted along with calcifications of the aortic branch vessels. Small thyroid nodules are noted. No neck mass or adenopathy. IMPRESSION: 1. No acute intracranial findings. No skull fracture. Stable age related cerebral atrophy, ventriculomegaly and periventricular white matter disease. 2. 3. Advanced degenerative cervical spondylosis with multilevel disc disease and facet disease but no acute fracture. Electronically Signed   By: Marijo Sanes M.D.   On: 09/14/2018 17:18   Dg Chest Portable 1 View  Result Date: 09/14/2018 CLINICAL DATA:  Syncopal episode. EXAM: PORTABLE CHEST 1 VIEW COMPARISON:  09/06/2018 FINDINGS: The the cardiac silhouette, mediastinal and hilar contours are within normal limits and stable given the AP projection and portable technique. Stable mild tortuosity and calcification of the  thoracic aorta. The lungs are clear. No pleural effusion. No worrisome pulmonary lesions. Stable calcified granuloma noted in the left upper lobe. IMPRESSION: No acute cardiopulmonary findings. Electronically Signed   By: Marijo Sanes M.D.   On: 09/14/2018 17:06    EKG:   Orders placed or performed during the hospital encounter of 09/14/18  . EKG 12-Lead  . EKG 12-Lead  . ED EKG  . ED EKG    IMPRESSION AND PLAN:   #Acute  kidney injury Admit to MedSurg unit Gentle hydration with IV fluids Avoid nephrotoxins Recheck a.m. labs  #Diarrhea Check stool for C. difficile toxin and GI panel Hydrate with IV fluids  #Hypertensive urgency Resume home medications metoprolol, hydralazine and titrate as needed IV Lopressor as needed  #Hyponatremia seem to be chronic Recheck in a.m. after hydrating her with IV fluids  #Diabetes mellitus Resume home medication Levemir and sliding scale insulin  #Syncope with generalized weakness   probably from dehydration from diarrhea/malignant hypertension PT evaluation Check orthostatics Not sure whether the syncopal episode or not we will monitor patient on telemetry EEG  #Left leg skin lesion-resection of squamous cell cancer Continue Keflex and granddaughter will take care of the dressing tomorrow    All the records are reviewed and case discussed with ED provider. Management plans discussed with the patient, granddaughter and great granddaughter at bedside and they are in agreement.  CODE STATUS: dnr  TOTAL TIME TAKING CARE OF THIS PATIENT: 42 minutes.   Note: This dictation was prepared with Dragon dictation along with smaller phrase technology. Any transcriptional errors that result from this process are unintentional.  Nicholes Mango M.D on 09/14/2018 at 9:14 PM  Between 7am to 6pm - Pager - [phone]  After 6pm go to [email or web address] - password [email or web address] [facility]  [facility]  Office  [phone]  CC: Primary care physician; Denton Lank, MD

## 2018-09-14 NOTE — ED Provider Notes (Signed)
[facility] Emergency Department Provider Note   ____________________________________________   First MD Initiated Contact with Patient 09/14/18 [phone]     (approximate)  I have reviewed the triage vital signs and the nursing notes.   HISTORY  Chief Complaint Loss of Consciousness and Diarrhea    HPI Kristen Barron is a 85 y.o. female who comes in with EMS.  Reportedly she was at home having diarrhea and passed out.  Patient she does not remember what happened.  She does not think she hit her head.  Later on family comes in reports she did pass out seem to have hit her head.  They got her back up on the toilet she began shaking and seemed to get out of it but not completely pass out vomited had her teeth clenched.  Did not seem to have aspirated.  They took her temperature and it was 95.  Sugar was 100+.  Patient began acting somewhat unusually kind of crazy they said.  She has done this before.  Patient is now back to baseline.  When she was found on the floor right after she fell off the toilet she was clammy .  Patient reports her belly does not feel right.   Past Medical History:  Diagnosis Date  . Arthritis   . Breast cancer ([place]) 03/06/2014   right breast with mastectomy and chemo  . Cancer [facility])    Reports lumpectomy for a cyst  . CHF (congestive heart failure) ([place])   . Collagen vascular disease ([place])   . Diabetes ([place])   . DVT (deep venous thrombosis) ([place])   . Hemorrhoids   . Hypertension   . Renal disorder   . Renal insufficiency   . Stroke [facility])    August 2014, but has had strokes prior as well    Patient Active Problem List   Diagnosis Date Noted  . Acute on chronic heart failure ([place]) 09/06/2018  . Chronic diastolic heart failure ([place]) 12/15/2017  . Lymphedema 12/15/2017  . Pressure injury of skin 06/08/2017  . Vomiting   . Gallstone   . Osteoarthritis of knee (Bilateral) (L>R) 12/26/2015  . Chronic pain 12/11/2015  . Long term  current use of opiate analgesic 12/11/2015  . Encounter for therapeutic drug level monitoring 12/11/2015  . Chronic knee pain (Location of Primary Source of Pain) (Bilateral) (L>R) 12/11/2015  . Long term prescription opiate use 12/11/2015  . Opiate use (20 MME/Day) 12/11/2015  . Chronic hand pain (Location of Secondary source of pain) (Bilateral) (L>R) 12/11/2015  . Failed back surgical syndrome x 2 (Surgeon: Dr. Deri Fuelling) 12/11/2015  . Lumbar spondylosis 12/11/2015  . Chronic low back pain (Location of Tertiary source of pain) (Bilateral) (L>R) 12/11/2015  . Lumbar facet syndrome 12/11/2015  . Chronic lower extremity pain (Bilateral) (L>R) 12/11/2015  . Chronic neck pain (Left) 12/11/2015  . Cervical spondylosis 12/11/2015  . Chronic shoulder pain (Right) 12/11/2015  . Insulin dependent diabetes mellitus ([place]) 12/11/2015  . Abnormal MRI, lumbar spine (07/19/2013) 12/11/2015  . Lumbar postlaminectomy syndrome (L4-5) 12/11/2015  . Fusion of lumbar spine (L4-5 Ray cages) 12/11/2015  . Lumbar Levoscoliosis with apex at L4 12/11/2015  . Grade 1 Retrolisthesis of L2 over L3 and L3 over L4 12/11/2015  . Lumbar facet arthropathy 12/11/2015  . Lumbar foraminal stenosis (Bilateral L2-3, L3-4 & Left L5-S1) 12/11/2015  . Chronic anticoagulation (Plavix) 12/11/2015  . Breast cancer of lower-outer quadrant of right female breast ([place]) 02/28/2014  . Lumbar  stenosis with neurogenic claudication 07/24/2013  . Lumbar central spinal stenosis ( Severe at L3-4) (Mild R>L L2-3 & L5-S1) 07/20/2013  . Hyponatremia 07/20/2013  . Hypertension 07/20/2013  . Normocytic anemia 07/20/2013  . CKD (chronic kidney disease) stage 3, GFR 30-59 ml/min (HCC) 07/20/2013  .  Lumbar DDD (degenerative disc disease) 07/20/2013    Past Surgical History:  Procedure Laterality Date  . ABDOMINAL HYSTERECTOMY     Patient not clear as to why  . BACK SURGERY    . BREAST BIOPSY Right February 15 2014   invasive mammary  carcinoma/ER/PR negative, HER 2 positive  . BREAST BIOPSY Right 1999   negative biopsy  . BREAST SURGERY Right 03/06/14   mastectomy  . COLONOSCOPY WITH PROPOFOL N/A 04/15/2016   Procedure: COLONOSCOPY WITH PROPOFOL;  Surgeon: Manya Silvas, MD;  Location: [facility] ENDOSCOPY;  Service: Endoscopy;  Laterality: N/A;  . ESOPHAGOGASTRODUODENOSCOPY (EGD) WITH PROPOFOL N/A 04/15/2016   Procedure: ESOPHAGOGASTRODUODENOSCOPY (EGD) WITH PROPOFOL;  Surgeon: Manya Silvas, MD;  Location[facility] ENDOSCOPY;  Service: Endoscopy;  Laterality: N/A;  . HAND SURGERY Right    Carpel tunnel release in the 1970s  . LUMBAR LAMINECTOMY/DECOMPRESSION MICRODISCECTOMY Bilateral 07/24/2013   Procedure: LUMBAR LAMINECTOMY/DECOMPRESSION MICRODISCECTOMY LUMBAR THREE-FOUR;  Surgeon: Charlie Pitter, MD;  Location: [place] NEURO ORS;  Service: Neurosurgery;  Laterality: Bilateral;  . MASTECTOMY Right 03/06/2014   right br ca. Dr Jamal Collin  . OOPHORECTOMY      Prior to Admission medications   Medication Sig Start Date End Date Taking? Authorizing Provider  benazepril (LOTENSIN) 40 MG tablet Take 1 tablet (40 mg total) by mouth daily. 12/12/17   Gouru, Illene Silver, MD  cephALEXin (KEFLEX) 500 MG capsule Take 1 capsule (500 mg total) by mouth 2 (two) times daily. 09/06/18   Lavonia Drafts, MD  clopidogrel (PLAVIX) 75 MG tablet Take 75 mg by mouth daily with breakfast.    [provider]  dicyclomine (BENTYL) 10 MG capsule Take 10 mg by mouth 4 (four) times daily as needed.  05/16/18   [provider]  gabapentin (NEURONTIN) 600 MG tablet Take 600 mg by mouth 4 (four) times daily.  11/25/16   [provider]  Glucosamine 500 MG CAPS Take 500 mg by mouth 3 (three) times daily.    [provider]  hydrALAZINE (APRESOLINE) 50 MG tablet Take 50 mg by mouth 3 (three) times daily.     [provider]  insulin aspart (NOVOLOG) 100 UNIT/ML injection Inject 4 Units into the skin 3 (three) times daily before meals.  Patient is only taking with biggest meal    [provider]  insulin detemir (LEVEMIR) 100 UNIT/ML injection Inject 18 Units into the skin at bedtime.    [provider]  magnesium oxide (MAG-OX) 400 MG tablet Take 400 mg by mouth 2 (two) times daily.    [provider]  metoprolol succinate (TOPROL-XL) 100 MG 24 hr tablet Take 1 tablet (100 mg total) by mouth daily. Take with or immediately following a meal. 12/12/17   Gouru, Aruna, MD  NORCO 5-325 MG tablet Take 1 tablet by mouth every 6 (six) hours as needed (back pain).  10/19/17   [provider]  Omega 3 1200 MG CAPS Take 1 capsule by mouth daily.     [provider]  ondansetron (ZOFRAN-ODT) 4 MG disintegrating tablet Take 4 mg by mouth every 8 (eight) hours as needed for nausea or vomiting.    [provider]  pantoprazole (PROTONIX) 40  MG tablet Take 40 mg by mouth daily.    [provider]  pravastatin (PRAVACHOL) 80 MG tablet Take 80 mg by mouth every evening.     [provider]  torsemide (DEMADEX) 20 MG tablet Take 40 mg by mouth daily.    [provider]    Allergies Patient has no known allergies.  Family History  Problem Relation Age of Onset  . CAD Father   . Cancer Sister 57       breast  . Breast cancer Sister 76  . Cancer Brother        kidney  . Cancer Maternal Aunt        breast  . Breast cancer Maternal Aunt 70  . Cancer Other        maternal niece with breast cancer  . Breast cancer Other   . Cancer Sister        pancreatic    Social History Social History   Tobacco Use  . Smoking status: Never Smoker  . Smokeless tobacco: Never Used  Substance Use Topics  . Alcohol use: No  . Drug use: No    Review of Systems  Constitutional: No fever/chills Eyes: No visual changes. ENT: No sore throat. Cardiovascular: Denies chest pain. Respiratory: Denies shortness of breath. Gastrointestinal: Some right sided abdominal pain.   Currently no nausea, no vomiting she has had this today.   diarrhea.  She sometimes has constipation but not today constipation. Genitourinary: Negative for dysuria. Musculoskeletal: Negative for back pain. Skin: Negative for rash. Neurological: Negative for headaches, focal weakness  ____________________________________________   PHYSICAL EXAM:  VITAL SIGNS: ED Triage Vitals [09/14/18 1634]  Enc Vitals Group     BP 138/86     Pulse Rate (!) 56     Resp 11     Temp (!) 97.4 F (36.3 C)     Temp Source Oral     SpO2 95 %     Weight 170 lb (77.1 kg)     Height 5\' 2"  (1.575 m)     Head Circumference      Peak Flow      Pain Score 0     Pain Loc      Pain Edu?      Excl. in [place]?     Constitutional: Alert and oriented.  Looks tired and weak Eyes: Conjunctivae are normal.  Head: Atraumatic. Nose: No congestion/rhinnorhea. Mouth/Throat: Mucous membranes are moist.  Oropharynx non-erythematous. Neck: No stridor. Cardiovascular: Normal rate, regular rhythm. Grossly normal heart sounds.  Good peripheral circulation. Respiratory: Normal respiratory effort.  No retractions. Lungs CTAB. Gastrointestinal: Soft initially just felt queasy.  When she gets back from CT there is some pain on the right side.. No distention. No abdominal bruits. No CVA tenderness. Musculoskeletal: No lower extremity tenderness nor edema.   Neurologic:  Normal speech and language. No gross focal neurologic deficits are appreciated.  Skin:  Skin is warm, dry and intact. No rash noted. Psychiatric: Mood and affect are normal. Speech and behavior are normal.  ____________________________________________   LABS (all labs ordered are listed, but only abnormal results are displayed)  Labs Reviewed  COMPREHENSIVE METABOLIC PANEL - Abnormal; Notable for the following components:      Result Value   Sodium 129 (*)    Chloride 97 (*)    Glucose, Bld 160 (*)    BUN 27 (*)    Creatinine, Ser 1.49 (*)    Calcium  7.8 (*)  Albumin 3.2 (*)    GFR calc non Af Amer 31 (*)    GFR calc Af Amer 36 (*)    All other components within normal limits  CBC WITH DIFFERENTIAL/PLATELET - Abnormal; Notable for the following components:   WBC 3.5 (*)    RBC 3.15 (*)    Hemoglobin 8.9 (*)    HCT 28.3 (*)    All other components within normal limits  BRAIN NATRIURETIC PEPTIDE - Abnormal; Notable for the following components:   B Natriuretic Peptide 159.0 (*)    All other components within normal limits  GASTROINTESTINAL PANEL BY PCR, STOOL (REPLACES STOOL CULTURE)  C DIFFICILE QUICK SCREEN W PCR REFLEX  TROPONIN I  LACTIC ACID, PLASMA  LACTIC ACID, PLASMA  URINALYSIS, COMPLETE (UACMP) WITH MICROSCOPIC   ____________________________________________  EKG  EKG read and interpreted by me shows sinus bradycardia rate of 56 normal axis no acute ST-T wave changes computer is reading nonspecific intraventricular conduction delay with QRS duration of 118 ms ____________________________________________  RADIOLOGY  ED MD interpretation: Chest x-ray read by radiology reviewed by me shows no acute disease CT of the head neck read by radiology reviewed by me showed no acute disease  CT of the abdomen shows no acute disease  Official radiology report(s): Ct Abdomen Pelvis Wo Contrast  Result Date: 09/14/2018 CLINICAL DATA:  Syncopal episode on toilet. Diarrhea, nausea and vomiting. History of breast cancer, hysterectomy and hemorrhoids. EXAM: CT ABDOMEN AND PELVIS WITHOUT CONTRAST TECHNIQUE: Multidetector CT imaging of the abdomen and pelvis was performed following the standard protocol without IV contrast. COMPARISON:  CT abdomen and pelvis November 23, 2016 FINDINGS: LOWER CHEST: Mucoid impaction RIGHT > LEFT lower lobe bronchi. Heart is at least mildly enlarged. Severe coronary artery calcifications. No pericardial effusion. Similar nodular thickening RIGHT hemidiaphragm. HEPATOBILIARY: Mild 1 cm gallstone with  additional tiny layering gallbladder sludge versus gallstones. No CT findings of acute cholecystitis. Calcified hepatic granulomas. PANCREAS: Fatty atrophy. SPLEEN: Nonacute.  Calcified splenic granulomas. ADRENALS/URINARY TRACT: Kidneys are orthotopic, demonstrating normal size and morphology. No nephrolithiasis, hydronephrosis; limited assessment for renal masses by nonenhanced CT. 16 mm hypodense exophytic RIGHT upper pole cyst. 13 mm RIGHT lower pole cyst. The unopacified ureters are normal in course and caliber. Urinary bladder is well distended and unremarkable. Normal adrenal glands. STOMACH/BOWEL: The stomach, small and large bowel are normal in course and caliber without inflammatory changes. Enteric contrast has not yet reached the distal small bowel. Moderate sigmoid colonic diverticulosis. Small hiatal hernia. The appendix is not discretely identified, however there are no inflammatory changes in the right lower quadrant. VASCULAR/LYMPHATIC: Aortoiliac vessels are normal in course and caliber. Moderate calcific atherosclerosis. No lymphadenopathy by CT size criteria. REPRODUCTIVE: Status post hysterectomy. OTHER: No intraperitoneal free fluid or free air. Moderate fat containing umbilical hernia. MUSCULOSKELETAL: Non-acute. Osteopenia. Severe lumbar spondylosis, status post L4-5 disc prosthesis with subsidence. IMPRESSION: 1. No acute intra-abdominal/pelvic process. 2. Colonic diverticulosis and cholelithiasis. Aortic Atherosclerosis (ICD10-I70.0). Electronically Signed   By: Elon Alas M.D.   On: 09/14/2018 18:58   Ct Head Wo Contrast  Result Date: 09/14/2018 CLINICAL DATA:  Syncopal episode today. EXAM: CT HEAD WITHOUT CONTRAST CT CERVICAL SPINE WITHOUT CONTRAST TECHNIQUE: Multidetector CT imaging of the head and cervical spine was performed following the standard protocol without intravenous contrast. Multiplanar CT image reconstructions of the cervical spine were also generated. COMPARISON:   Head CT from 2014 and brain MRI 11/25/2016 FINDINGS: CT HEAD FINDINGS Brain: Stable age related cerebral atrophy, ventriculomegaly  and periventricular white matter disease. No extra-axial fluid collections are identified. No CT findings for acute hemispheric infarction or intracranial hemorrhage. No mass lesions. The brainstem and cerebellum are normal. Vascular: Stable vascular calcifications but no definite aneurysm or hyperdense vessels. Skull: No skull fracture or bone lesion. Sinuses/Orbits: Both half sphenoid sinus disease. The other paranasal sinuses are grossly clear. The mastoid air cells and middle ear cavities are clear. The globes are intact. Other: No scalp lesions or hematoma. CT CERVICAL SPINE FINDINGS Alignment: Advanced degenerative cervical spondylosis with multilevel disc disease and facet disease. Mild multilevel degenerative subluxations are noted. Skull base and vertebrae: The skull base C1 and C1-2 articulations are maintained. There are advanced degenerative changes at C1-2 with calcified pannus formation and mild mass effect on the upper cervical thecal sac. No acute fracture is identified. Soft tissues and spinal canal: No prevertebral fluid or swelling. No visible canal hematoma. Disc levels: Generous spinal canal. There is multilevel bony foraminal narrowing due to uncinate spurring and facet disease. Upper chest: The lung apices are grossly clear. Other: Bilateral carotid artery calcifications are noted along with calcifications of the aortic branch vessels. Small thyroid nodules are noted. No neck mass or adenopathy. IMPRESSION: 1. No acute intracranial findings. No skull fracture. Stable age related cerebral atrophy, ventriculomegaly and periventricular white matter disease. 2. 3. Advanced degenerative cervical spondylosis with multilevel disc disease and facet disease but no acute fracture. Electronically Signed   By: Marijo Sanes M.D.   On: 09/14/2018 17:18   Ct Cervical Spine Wo  Contrast  Result Date: 09/14/2018 CLINICAL DATA:  Syncopal episode today. EXAM: CT HEAD WITHOUT CONTRAST CT CERVICAL SPINE WITHOUT CONTRAST TECHNIQUE: Multidetector CT imaging of the head and cervical spine was performed following the standard protocol without intravenous contrast. Multiplanar CT image reconstructions of the cervical spine were also generated. COMPARISON:  Head CT from 2014 and brain MRI 11/25/2016 FINDINGS: CT HEAD FINDINGS Brain: Stable age related cerebral atrophy, ventriculomegaly and periventricular white matter disease. No extra-axial fluid collections are identified. No CT findings for acute hemispheric infarction or intracranial hemorrhage. No mass lesions. The brainstem and cerebellum are normal. Vascular: Stable vascular calcifications but no definite aneurysm or hyperdense vessels. Skull: No skull fracture or bone lesion. Sinuses/Orbits: Both half sphenoid sinus disease. The other paranasal sinuses are grossly clear. The mastoid air cells and middle ear cavities are clear. The globes are intact. Other: No scalp lesions or hematoma. CT CERVICAL SPINE FINDINGS Alignment: Advanced degenerative cervical spondylosis with multilevel disc disease and facet disease. Mild multilevel degenerative subluxations are noted. Skull base and vertebrae: The skull base C1 and C1-2 articulations are maintained. There are advanced degenerative changes at C1-2 with calcified pannus formation and mild mass effect on the upper cervical thecal sac. No acute fracture is identified. Soft tissues and spinal canal: No prevertebral fluid or swelling. No visible canal hematoma. Disc levels: Generous spinal canal. There is multilevel bony foraminal narrowing due to uncinate spurring and facet disease. Upper chest: The lung apices are grossly clear. Other: Bilateral carotid artery calcifications are noted along with calcifications of the aortic branch vessels. Small thyroid nodules are noted. No neck mass or adenopathy.  IMPRESSION: 1. No acute intracranial findings. No skull fracture. Stable age related cerebral atrophy, ventriculomegaly and periventricular white matter disease. 2. 3. Advanced degenerative cervical spondylosis with multilevel disc disease and facet disease but no acute fracture. Electronically Signed   By: Marijo Sanes M.D.   On: 09/14/2018 17:18   Dg Chest  Portable 1 View  Result Date: 09/14/2018 CLINICAL DATA:  Syncopal episode. EXAM: PORTABLE CHEST 1 VIEW COMPARISON:  09/06/2018 FINDINGS: The the cardiac silhouette, mediastinal and hilar contours are within normal limits and stable given the AP projection and portable technique. Stable mild tortuosity and calcification of the thoracic aorta. The lungs are clear. No pleural effusion. No worrisome pulmonary lesions. Stable calcified granuloma noted in the left upper lobe. IMPRESSION: No acute cardiopulmonary findings. Electronically Signed   By: Marijo Sanes M.D.   On: 09/14/2018 17:06    ____________________________________________   PROCEDURES  Procedure(s) performed:  Procedures  Critical Care performed:   ____________________________________________   INITIAL IMPRESSION / ASSESSMENT AND PLAN / ED COURSE  Patient has not been out of produce diarrhea here yet.  She has had diarrhea for quite some time and likely is dehydrated.  We will give her some fluids as her BNP is elevated and she is old we will do this slowly.  Try to get some sent to lab for analysis and will admit her for the syncope.         ____________________________________________   FINAL CLINICAL IMPRESSION(S) / ED DIAGNOSES  Final diagnoses:  Syncope and collapse  Diarrhea, unspecified type  Hyponatremia  Worsening renal function     ED Discharge Orders    None       Note:  This document was prepared using Dragon voice recognition software and may include unintentional dictation errors.    Nena Polio, MD 09/14/18 1911

## 2018-09-15 ENCOUNTER — Inpatient Hospital Stay: Payer: Medicare HMO

## 2018-09-15 LAB — CBC
HCT: 25.7 % — ABNORMAL LOW (ref 36.0–46.0)
Hemoglobin: 8.4 g/dL — ABNORMAL LOW (ref 12.0–15.0)
MCH: 29.2 pg (ref 26.0–34.0)
MCHC: 32.7 g/dL (ref 30.0–36.0)
MCV: 89.2 fL (ref 80.0–100.0)
Platelets: 221 10*3/uL (ref 150–400)
RBC: 2.88 MIL/uL — ABNORMAL LOW (ref 3.87–5.11)
RDW: 13.9 % (ref 11.5–15.5)
WBC: 3.7 10*3/uL — ABNORMAL LOW (ref 4.0–10.5)
nRBC: 0 % (ref 0.0–0.2)

## 2018-09-15 LAB — GLUCOSE, CAPILLARY
GLUCOSE-CAPILLARY: 113 mg/dL — AB (ref 70–99)
GLUCOSE-CAPILLARY: 129 mg/dL — AB (ref 70–99)
GLUCOSE-CAPILLARY: 94 mg/dL (ref 70–99)
Glucose-Capillary: 120 mg/dL — ABNORMAL HIGH (ref 70–99)
Glucose-Capillary: 51 mg/dL — ABNORMAL LOW (ref 70–99)
Glucose-Capillary: 96 mg/dL (ref 70–99)

## 2018-09-15 LAB — COMPREHENSIVE METABOLIC PANEL
ALT: 10 U/L (ref 0–44)
AST: 18 U/L (ref 15–41)
Albumin: 2.7 g/dL — ABNORMAL LOW (ref 3.5–5.0)
Alkaline Phosphatase: 51 U/L (ref 38–126)
Anion gap: 6 (ref 5–15)
BUN: 21 mg/dL (ref 8–23)
CHLORIDE: 100 mmol/L (ref 98–111)
CO2: 25 mmol/L (ref 22–32)
Calcium: 7.7 mg/dL — ABNORMAL LOW (ref 8.9–10.3)
Creatinine, Ser: 1.19 mg/dL — ABNORMAL HIGH (ref 0.44–1.00)
GFR calc Af Amer: 48 mL/min — ABNORMAL LOW (ref >60)
GFR calc non Af Amer: 41 mL/min — ABNORMAL LOW (ref >60)
Glucose, Bld: 111 mg/dL — ABNORMAL HIGH (ref 70–99)
Potassium: 4.3 mmol/L (ref 3.5–5.1)
Sodium: 131 mmol/L — ABNORMAL LOW (ref 135–145)
Total Bilirubin: 0.5 mg/dL (ref 0.3–1.2)
Total Protein: 5.7 g/dL — ABNORMAL LOW (ref 6.5–8.1)

## 2018-09-15 LAB — GASTROINTESTINAL PANEL BY PCR, STOOL (REPLACES STOOL CULTURE)

## 2018-09-15 LAB — C DIFFICILE QUICK SCREEN W PCR REFLEX
C Diff antigen: NEGATIVE
C Diff interpretation: NOT DETECTED
C Diff toxin: NEGATIVE

## 2018-09-15 MED ORDER — DICYCLOMINE HCL 10 MG PO CAPS: 10.0000 mg | ORAL_CAPSULE | Freq: Four times a day (QID) | ORAL | Status: DC | PRN

## 2018-09-15 MED ORDER — MAGNESIUM OXIDE 400 (241.3 MG) MG PO TABS
400.0000 mg | ORAL_TABLET | Freq: Two times a day (BID) | ORAL | Status: DC
  Administered 2018-09-15 – 2018-09-16 (×4): 400 mg via ORAL
  Filled 2018-09-15 (×4): qty 1

## 2018-09-15 MED ORDER — ONDANSETRON 4 MG PO TBDP: 4.0000 mg | ORAL_TABLET | Freq: Three times a day (TID) | ORAL | Status: DC | PRN

## 2018-09-15 MED ORDER — PANTOPRAZOLE SODIUM 40 MG PO TBEC
40.0000 mg | DELAYED_RELEASE_TABLET | Freq: Every day | ORAL | Status: DC
  Administered 2018-09-15 – 2018-09-16 (×2): 40 mg via ORAL
  Filled 2018-09-15 (×2): qty 1

## 2018-09-15 MED ORDER — CEPHALEXIN 500 MG PO CAPS
500.0000 mg | ORAL_CAPSULE | Freq: Two times a day (BID) | ORAL | Status: DC
  Administered 2018-09-15 – 2018-09-16 (×4): 500 mg via ORAL
  Filled 2018-09-15 (×4): qty 1

## 2018-09-15 MED ORDER — CLOPIDOGREL BISULFATE 75 MG PO TABS
75.0000 mg | ORAL_TABLET | Freq: Every day | ORAL | Status: DC
  Administered 2018-09-15 – 2018-09-16 (×2): 75 mg via ORAL
  Filled 2018-09-15 (×2): qty 1

## 2018-09-15 MED ORDER — GLUCOSAMINE 500 MG PO CAPS: 500.0000 mg | ORAL_CAPSULE | Freq: Three times a day (TID) | ORAL | Status: DC

## 2018-09-15 MED ORDER — ENOXAPARIN SODIUM 40 MG/0.4ML ~~LOC~~ SOLN
40.0000 mg | SUBCUTANEOUS | Status: DC
  Administered 2018-09-15: 23:00:00 40 mg via SUBCUTANEOUS
  Filled 2018-09-15: qty 0.4

## 2018-09-15 MED ORDER — OMEGA-3-ACID ETHYL ESTERS 1 G PO CAPS
1.0000 g | ORAL_CAPSULE | Freq: Two times a day (BID) | ORAL | Status: DC
  Administered 2018-09-15 – 2018-09-16 (×3): 1 g via ORAL
  Filled 2018-09-15 (×3): qty 1

## 2018-09-15 MED ORDER — HYDRALAZINE HCL 50 MG PO TABS
50.0000 mg | ORAL_TABLET | Freq: Three times a day (TID) | ORAL | Status: DC
  Administered 2018-09-15 – 2018-09-16 (×4): 50 mg via ORAL
  Filled 2018-09-15 (×4): qty 1

## 2018-09-15 MED ORDER — METOPROLOL SUCCINATE ER 50 MG PO TB24
100.0000 mg | ORAL_TABLET | Freq: Every day | ORAL | Status: DC
  Administered 2018-09-15 – 2018-09-16 (×2): 100 mg via ORAL
  Filled 2018-09-15 (×2): qty 2

## 2018-09-15 MED ORDER — HYDROCODONE-ACETAMINOPHEN 5-325 MG PO TABS
1.0000 | ORAL_TABLET | Freq: Four times a day (QID) | ORAL | Status: DC | PRN
  Administered 2018-09-15 – 2018-09-16 (×3): 1 via ORAL
  Filled 2018-09-15 (×3): qty 1

## 2018-09-15 MED ORDER — BENAZEPRIL HCL 20 MG PO TABS
40.0000 mg | ORAL_TABLET | Freq: Every day | ORAL | Status: DC
  Administered 2018-09-15 – 2018-09-16 (×2): 40 mg via ORAL
  Filled 2018-09-15 (×2): qty 2

## 2018-09-15 MED ORDER — GABAPENTIN 600 MG PO TABS: 600.0000 mg | ORAL_TABLET | Freq: Four times a day (QID) | ORAL | Status: DC

## 2018-09-15 MED ORDER — GUAIFENESIN-DM 100-10 MG/5ML PO SYRP
5.0000 mL | ORAL_SOLUTION | ORAL | Status: DC | PRN
  Administered 2018-09-15 – 2018-09-16 (×2): 5 mL via ORAL
  Filled 2018-09-15 (×4): qty 5

## 2018-09-15 MED ORDER — INSULIN DETEMIR 100 UNIT/ML ~~LOC~~ SOLN
18.0000 [IU] | Freq: Every day | SUBCUTANEOUS | Status: DC
  Administered 2018-09-15 (×2): 18 [IU] via SUBCUTANEOUS
  Filled 2018-09-15 (×3): qty 0.18

## 2018-09-15 MED ORDER — INSULIN ASPART 100 UNIT/ML ~~LOC~~ SOLN
4.0000 [IU] | Freq: Three times a day (TID) | SUBCUTANEOUS | Status: DC
  Administered 2018-09-15 (×2): 4 [IU] via SUBCUTANEOUS
  Filled 2018-09-15 (×2): qty 1

## 2018-09-15 MED ORDER — ENOXAPARIN SODIUM 30 MG/0.3ML ~~LOC~~ SOLN: 30.0000 mg | SUBCUTANEOUS | Status: DC

## 2018-09-15 MED ORDER — ONDANSETRON HCL 4 MG PO TABS: 4.0000 mg | ORAL_TABLET | Freq: Four times a day (QID) | ORAL | Status: DC | PRN

## 2018-09-15 MED ORDER — PRAVASTATIN SODIUM 20 MG PO TABS
80.0000 mg | ORAL_TABLET | Freq: Every evening | ORAL | Status: DC
  Administered 2018-09-15: 18:00:00 80 mg via ORAL
  Filled 2018-09-15: qty 4

## 2018-09-15 MED ORDER — OMEGA 3 1200 MG PO CAPS: 1.0000 | ORAL_CAPSULE | Freq: Every day | ORAL | Status: DC

## 2018-09-15 MED ORDER — ONDANSETRON HCL 4 MG/2ML IJ SOLN: 4.0000 mg | Freq: Four times a day (QID) | INTRAMUSCULAR | Status: DC | PRN

## 2018-09-15 NOTE — Progress Notes (Signed)
   09/15/18 1000  Clinical Encounter Type  Visited With Patient  Visit Type Initial  Spiritual Encounters  Spiritual Needs Emotional  Ch visited during rounds. Pt was in good spirit though was quite unsure about what is happening to her. Pt told a story of her passing out the day before. Pt also talked about her granddaughter who she mostly raised. Ch provided a caring presence and offered that chaplain service is always available if she needs.

## 2018-09-15 NOTE — Progress Notes (Signed)
eeg completed ° °

## 2018-09-15 NOTE — Progress Notes (Signed)
PHARMACIST - PHYSICIAN ORDER COMMUNICATION  CONCERNING: P&T Medication Policy on Herbal Medications  DESCRIPTION:  This patient's order for:  Glucosamine  has been noted.  This product(s) is classified as an "herbal" or natural product. Due to a lack of definitive safety studies or FDA approval, nonstandard manufacturing practices, plus the potential risk of unknown drug-drug interactions while on inpatient medications, the Pharmacy and Therapeutics Committee does not permit the use of "herbal" or natural products of this type within [facility].   ACTION TAKEN: The pharmacy department is unable to verify this order at this time. Please reevaluate patient's clinical condition at discharge and address if the herbal or natural product(s) should be resumed at that time.

## 2018-09-15 NOTE — ED Notes (Signed)
ED TO INPATIENT HANDOFF REPORT  Name/Age/Gender Kristen Barron 85 y.o. female  Code Status Code Status History    Date Active Date Inactive Code Status Order ID Comments User Context   12/09/2017 2316 12/11/2017 1708 Full Code [id number]  Harrie Foreman, MD Inpatient   06/07/2017 1448 06/09/2017 1659 Full Code [id number]  Loletha Grayer, MD ED   11/23/2016 1726 11/25/2016 1842 Full Code [id number]  Bettey Costa, MD Inpatient   04/13/2016 0112 04/16/2016 1749 Full Code [id number]  Harvie Bridge, DO Inpatient   04/09/2016 0544 04/09/2016 1732 Full Code [id number]  Saundra Shelling, MD Inpatient   07/24/2013 1419 07/26/2013 2038 Full Code [id number]  Charlie Pitter, MD Inpatient   07/20/2013 1336 07/24/2013 1419 Full Code [id number]  Othella Boyer, MD Inpatient      Home/SNF/Other Home  Chief Complaint syncope, emesis, diarrhea  Level of Care/Admitting Diagnosis ED Disposition    ED Disposition Condition [place]: [place] [[id number]]  Level of Care: Med-Surg [16]  Diagnosis: AKI (acute kidney injury) [facility]) [[id number]]  Admitting Physician: Nicholes Mango [[id number]]  Attending Physician: Nicholes Mango [[id number]]  Estimated length of stay: past midnight tomorrow  Certification:: I certify this patient will need inpatient services for at least 2 midnights  Bed request comments: 1c  PT Class (Do Not Modify): Inpatient [101]  PT Acc Code (Do Not Modify): Private [1]       Medical History Past Medical History:  Diagnosis Date  . Arthritis   . Breast cancer ([place]) 03/06/2014   right breast with mastectomy and chemo  . Cancer [facility])    Reports lumpectomy for a cyst  . CHF (congestive heart failure) ([place])   . Collagen vascular disease ([place])   . Diabetes ([place])   . DVT (deep venous thrombosis) ([place])   . Hemorrhoids   . Hypertension   . Renal disorder   . Renal insufficiency   . Stroke [facility])    August 2014, but has had strokes prior as well     Allergies No Known Allergies  IV Location/Drains/Wounds Patient Lines/Drains/Airways Status   Active Line/Drains/Airways    Name:   Placement date:   Placement time:   Site:   Days:   Implanted Port 03/23/16 Left Chest   03/23/16    -    Chest   906   Peripheral IV 09/14/18 Left Antecubital   09/14/18    1753    Antecubital   1          Labs/Imaging Results for orders placed or performed during the hospital encounter of 09/14/18 (from the past 48 hour(s))  Comprehensive metabolic panel     Status: Abnormal   Collection Time: 09/14/18  4:41 PM  Result Value Ref Range   Sodium 129 (L) 135 - 145 mmol/L   Potassium 4.0 3.5 - 5.1 mmol/L   Chloride 97 (L) 98 - 111 mmol/L   CO2 23 22 - 32 mmol/L   Glucose, Bld 160 (H) 70 - 99 mg/dL   BUN 27 (H) 8 - 23 mg/dL   Creatinine, Ser 1.49 (H) 0.44 - 1.00 mg/dL   Calcium 7.8 (L) 8.9 - 10.3 mg/dL   Total Protein 6.6 6.5 - 8.1 g/dL   Albumin 3.2 (L) 3.5 - 5.0 g/dL   AST 22 15 - 41 U/L   ALT 12 0 - 44 U/L   Alkaline Phosphatase 53 38 - 126 U/L   Total Bilirubin 0.4 0.3 - 1.2  mg/dL   GFR calc non Af Amer 31 (L) >60 mL/min   GFR calc Af Amer 36 (L) >60 mL/min   Anion gap 9 5 - 15    Comment: Performed at [facility], [address] [place], [place] [address]  Troponin I - Once     Status: None   Collection Time: 09/14/18  4:41 PM  Result Value Ref Range   Troponin I <0.03 <0.03 ng/mL    Comment: Performed at [facility], [address] [place], [place] [address]  CBC with Differential     Status: Abnormal   Collection Time: 09/14/18  4:41 PM  Result Value Ref Range   WBC 3.5 (L) 4.0 - 10.5 K/uL   RBC 3.15 (L) 3.87 - 5.11 MIL/uL   Hemoglobin 8.9 (L) 12.0 - 15.0 g/dL   HCT 28.3 (L) 36.0 - 46.0 %   MCV 89.8 80.0 - 100.0 fL   MCH 28.3 26.0 - 34.0 pg   MCHC 31.4 30.0 - 36.0 g/dL   RDW 14.1 11.5 - 15.5 %   Platelets 257 150 - 400 K/uL   nRBC 0.0 0.0 - 0.2 %   Neutrophils Relative % 56 %   Neutro Abs 2.0 1.7 -  7.7 K/uL   Lymphocytes Relative 30 %   Lymphs Abs 1.0 0.7 - 4.0 K/uL   Monocytes Relative 10 %   Monocytes Absolute 0.4 0.1 - 1.0 K/uL   Eosinophils Relative 2 %   Eosinophils Absolute 0.1 0.0 - 0.5 K/uL   Basophils Relative 1 %   Basophils Absolute 0.0 0.0 - 0.1 K/uL   Immature Granulocytes 1 %   Abs Immature Granulocytes 0.02 0.00 - 0.07 K/uL    Comment: Performed at [facility], [address] [place], [place] [address]  Brain natriuretic peptide     Status: Abnormal   Collection Time: 09/14/18  4:41 PM  Result Value Ref Range   B Natriuretic Peptide 159.0 (H) 0.0 - 100.0 pg/mL    Comment: Performed at [facility], [address] [place], [place] [address]  Lactic acid, plasma     Status: None   Collection Time: 09/14/18  5:44 PM  Result Value Ref Range   Lactic Acid, Venous 1.0 0.5 - 1.9 mmol/L    Comment: Performed at [facility], [address] [place],  [address]  Urinalysis, Complete w Microscopic     Status: Abnormal   Collection Time: 09/14/18  7:14 PM  Result Value Ref Range   Color, Urine STRAW (A) YELLOW   APPearance CLEAR (A) CLEAR   Specific Gravity, Urine 1.006 1.005 - 1.030   pH 6.0 5.0 - 8.0   Glucose, UA NEGATIVE NEGATIVE mg/dL   Hgb urine dipstick NEGATIVE NEGATIVE   Bilirubin Urine NEGATIVE NEGATIVE   Ketones, ur NEGATIVE NEGATIVE mg/dL   Protein, ur 30 (A) NEGATIVE mg/dL   Nitrite NEGATIVE NEGATIVE   Leukocytes, UA NEGATIVE NEGATIVE   RBC / HPF 0-5 0 - 5 RBC/hpf   WBC, UA 0-5 0 - 5 WBC/hpf   Bacteria, UA NONE SEEN NONE SEEN   Squamous Epithelial / LPF NONE SEEN 0 - 5   Mucus PRESENT     Comment: Performed at [facility], [address] [place], [place] [address]  Lactic acid, plasma     Status: None   Collection Time: 09/14/18  8:32 PM  Result Value Ref Range   Lactic Acid, Venous 1.0 0.5 - 1.9 mmol/L  Comment: Performed at [facility], [address], [place], [place] [address]    Ct Abdomen Pelvis Wo Contrast  Result Date: 09/14/2018 CLINICAL DATA:  Syncopal episode on toilet. Diarrhea, nausea and vomiting. History of breast cancer, hysterectomy and hemorrhoids. EXAM: CT ABDOMEN AND PELVIS WITHOUT CONTRAST TECHNIQUE: Multidetector CT imaging of the abdomen and pelvis was performed following the standard protocol without IV contrast. COMPARISON:  CT abdomen and pelvis November 23, 2016 FINDINGS: LOWER CHEST: Mucoid impaction RIGHT > LEFT lower lobe bronchi. Heart is at least mildly enlarged. Severe coronary artery calcifications. No pericardial effusion. Similar nodular thickening RIGHT hemidiaphragm. HEPATOBILIARY: Mild 1 cm gallstone with additional tiny layering gallbladder sludge versus gallstones. No CT findings of acute cholecystitis. Calcified hepatic granulomas. PANCREAS: Fatty atrophy. SPLEEN: Nonacute.  Calcified splenic granulomas. ADRENALS/URINARY TRACT: Kidneys are orthotopic, demonstrating normal size and morphology. No nephrolithiasis, hydronephrosis; limited assessment for renal masses by nonenhanced CT. 16 mm hypodense exophytic RIGHT upper pole cyst. 13 mm RIGHT lower pole cyst. The unopacified ureters are normal in course and caliber. Urinary bladder is well distended and unremarkable. Normal adrenal glands. STOMACH/BOWEL: The stomach, small and large bowel are normal in course and caliber without inflammatory changes. Enteric contrast has not yet reached the distal small bowel. Moderate sigmoid colonic diverticulosis. Small hiatal hernia. The appendix is not discretely identified, however there are no inflammatory changes in the right lower quadrant. VASCULAR/LYMPHATIC: Aortoiliac vessels are normal in course and caliber. Moderate calcific atherosclerosis. No lymphadenopathy by CT size criteria. REPRODUCTIVE: Status post hysterectomy. OTHER: No intraperitoneal free fluid or free air. Moderate fat containing umbilical hernia. MUSCULOSKELETAL: Non-acute. Osteopenia. Severe  lumbar spondylosis, status post L4-5 disc prosthesis with subsidence. IMPRESSION: 1. No acute intra-abdominal/pelvic process. 2. Colonic diverticulosis and cholelithiasis. Aortic Atherosclerosis (ICD10-I70.0). Electronically Signed   By: Elon Alas M.D.   On: 09/14/2018 18:58   Ct Head Wo Contrast  Result Date: 09/14/2018 CLINICAL DATA:  Syncopal episode today. EXAM: CT HEAD WITHOUT CONTRAST CT CERVICAL SPINE WITHOUT CONTRAST TECHNIQUE: Multidetector CT imaging of the head and cervical spine was performed following the standard protocol without intravenous contrast. Multiplanar CT image reconstructions of the cervical spine were also generated. COMPARISON:  Head CT from 2014 and brain MRI 11/25/2016 FINDINGS: CT HEAD FINDINGS Brain: Stable age related cerebral atrophy, ventriculomegaly and periventricular white matter disease. No extra-axial fluid collections are identified. No CT findings for acute hemispheric infarction or intracranial hemorrhage. No mass lesions. The brainstem and cerebellum are normal. Vascular: Stable vascular calcifications but no definite aneurysm or hyperdense vessels. Skull: No skull fracture or bone lesion. Sinuses/Orbits: Both half sphenoid sinus disease. The other paranasal sinuses are grossly clear. The mastoid air cells and middle ear cavities are clear. The globes are intact. Other: No scalp lesions or hematoma. CT CERVICAL SPINE FINDINGS Alignment: Advanced degenerative cervical spondylosis with multilevel disc disease and facet disease. Mild multilevel degenerative subluxations are noted. Skull base and vertebrae: The skull base C1 and C1-2 articulations are maintained. There are advanced degenerative changes at C1-2 with calcified pannus formation and mild mass effect on the upper cervical thecal sac. No acute fracture is identified. Soft tissues and spinal canal: No prevertebral fluid or swelling. No visible canal hematoma. Disc levels: Generous spinal canal. There is  multilevel bony foraminal narrowing due to uncinate spurring and facet disease. Upper chest: The lung apices are grossly clear. Other: Bilateral carotid artery calcifications are noted along with calcifications of the aortic branch vessels. Small thyroid nodules are noted. No neck  mass or adenopathy. IMPRESSION: 1. No acute intracranial findings. No skull fracture. Stable age related cerebral atrophy, ventriculomegaly and periventricular white matter disease. 2. 3. Advanced degenerative cervical spondylosis with multilevel disc disease and facet disease but no acute fracture. Electronically Signed   By: Marijo Sanes M.D.   On: 09/14/2018 17:18   Ct Cervical Spine Wo Contrast  Result Date: 09/14/2018 CLINICAL DATA:  Syncopal episode today. EXAM: CT HEAD WITHOUT CONTRAST CT CERVICAL SPINE WITHOUT CONTRAST TECHNIQUE: Multidetector CT imaging of the head and cervical spine was performed following the standard protocol without intravenous contrast. Multiplanar CT image reconstructions of the cervical spine were also generated. COMPARISON:  Head CT from 2014 and brain MRI 11/25/2016 FINDINGS: CT HEAD FINDINGS Brain: Stable age related cerebral atrophy, ventriculomegaly and periventricular white matter disease. No extra-axial fluid collections are identified. No CT findings for acute hemispheric infarction or intracranial hemorrhage. No mass lesions. The brainstem and cerebellum are normal. Vascular: Stable vascular calcifications but no definite aneurysm or hyperdense vessels. Skull: No skull fracture or bone lesion. Sinuses/Orbits: Both half sphenoid sinus disease. The other paranasal sinuses are grossly clear. The mastoid air cells and middle ear cavities are clear. The globes are intact. Other: No scalp lesions or hematoma. CT CERVICAL SPINE FINDINGS Alignment: Advanced degenerative cervical spondylosis with multilevel disc disease and facet disease. Mild multilevel degenerative subluxations are noted. Skull base  and vertebrae: The skull base C1 and C1-2 articulations are maintained. There are advanced degenerative changes at C1-2 with calcified pannus formation and mild mass effect on the upper cervical thecal sac. No acute fracture is identified. Soft tissues and spinal canal: No prevertebral fluid or swelling. No visible canal hematoma. Disc levels: Generous spinal canal. There is multilevel bony foraminal narrowing due to uncinate spurring and facet disease. Upper chest: The lung apices are grossly clear. Other: Bilateral carotid artery calcifications are noted along with calcifications of the aortic branch vessels. Small thyroid nodules are noted. No neck mass or adenopathy. IMPRESSION: 1. No acute intracranial findings. No skull fracture. Stable age related cerebral atrophy, ventriculomegaly and periventricular white matter disease. 2. 3. Advanced degenerative cervical spondylosis with multilevel disc disease and facet disease but no acute fracture. Electronically Signed   By: Marijo Sanes M.D.   On: 09/14/2018 17:18   Dg Chest Portable 1 View  Result Date: 09/14/2018 CLINICAL DATA:  Syncopal episode. EXAM: PORTABLE CHEST 1 VIEW COMPARISON:  09/06/2018 FINDINGS: The the cardiac silhouette, mediastinal and hilar contours are within normal limits and stable given the AP projection and portable technique. Stable mild tortuosity and calcification of the thoracic aorta. The lungs are clear. No pleural effusion. No worrisome pulmonary lesions. Stable calcified granuloma noted in the left upper lobe. IMPRESSION: No acute cardiopulmonary findings. Electronically Signed   By: Marijo Sanes M.D.   On: 09/14/2018 17:06    Pending Labs Unresulted Labs (From admission, onward)    Start     Ordered   09/14/18 2106  Gastrointestinal Panel by PCR , Stool  (Gastrointestinal Panel by PCR, Stool)  Once,   STAT     09/14/18 2105   09/14/18 2106  C difficile quick scan w PCR reflex  (C Difficile quick screen w PCR reflex  panel)  Once, for 24 hours,   STAT     09/14/18 2105   09/14/18 1645  C difficile quick scan w PCR reflex  (C Difficile quick screen w PCR reflex panel)  Once, for 24 hours,   STAT  09/14/18 1645   09/14/18 1644  Gastrointestinal Panel by PCR , Stool  (Gastrointestinal Panel by PCR, Stool)  ONCE - STAT,   STAT     09/14/18 1645   Signed and Held  CBC  (enoxaparin (LOVENOX)    CrCl < 30 ml/min)  Once,   R    Comments:  Baseline for enoxaparin therapy IF NOT ALREADY DRAWN.  Notify MD if PLT < 100 K.    Signed and Held   Signed and Held  Creatinine, serum  (enoxaparin (LOVENOX)    CrCl < 30 ml/min)  Once,   R    Comments:  Baseline for enoxaparin therapy IF NOT ALREADY DRAWN.    Signed and Held   Signed and Held  Creatinine, serum  (enoxaparin (LOVENOX)    CrCl < 30 ml/min)  Weekly,   R    Comments:  while on enoxaparin therapy.    Signed and Held   Signed and Held  CBC  Tomorrow morning,   R     Signed and Held   Signed and Held  Comprehensive metabolic panel  Tomorrow morning,   R     Signed and Held          Vitals/Pain Today's Vitals   09/14/18 1634 09/14/18 1911 09/14/18 2354  BP: 138/86 (!) 202/67 (!) 190/64  Pulse: (!) 56 63 72  Resp: 11 15 19   Temp: (!) 97.4 F (36.3 C)  98.3 F (36.8 C)  TempSrc: Oral  Oral  SpO2: 95% 96% 97%  Weight: 77.1 kg    Height: 5\' 2"  (1.575 m)    PainSc: 0-No pain 0-No pain 0-No pain    Isolation Precautions Enteric precautions (UV disinfection)  Medications Medications  metoprolol tartrate (LOPRESSOR) injection 5 mg (has no administration in time range)  0.9 %  sodium chloride infusion (has no administration in time range)  iopamidol (ISOVUE-300) 61 % injection 30 mL (30 mLs Oral Contrast Given 09/14/18 1729)  0.9 %  sodium chloride infusion ( Intravenous Stopped 09/14/18 2359)    Mobility walks

## 2018-09-15 NOTE — Progress Notes (Unsigned)
  Kristen A. Merlene Laughter, MD     [email or web address]           HISTORY: This is an 85 year old who presents with episode of loss of consciousness.  The studies been done to evaluate for seizures and etiology.  MEDICATIONS: Scheduled Meds: Continuous Infusions: PRN Meds:.     ANALYSIS: A 16 channel recording using standard 10 20 measurements is conducted for 31 minutes.  There is a well-formed posterior dominant rhythm of 7.5-8 Hz which attenuates with eye opening.  There is beta activity over the frontal areas.  However, most of recording is documented with theta slowing in the 5 to 6 Hz range sometimes with overlying 2 to 4 Hz delta activity.  Sleep architecture is observed with spindles and K complexes occasionally.  Photic stimulation is carried out without abnormal changes in the background activity.  There is no focal or lateralized slowing.  There is no epileptiform activity is observed.   IMPRESSION: 1.  This recording of the awake and sleep states shows mild global slowing.  Otherwise, no epileptiform activities are observed.      Hiliary Osorto A. Merlene Barron, M.D.  Diplomate, Tax adviser of Psychiatry and Neurology ( Neurology).

## 2018-09-15 NOTE — Evaluation (Signed)
Physical Therapy Evaluation Patient Details Name: Kristen Barron MRN: [id number] DOB: June 29, 1929 Today's Date: 09/15/2018   History of Present Illness  Pt is a 85 y.o female that was admitted to the ED following a syncopal episode in which it is unclear if she lost consciousness and experienced N/V, pale, and diaphoretic. Pt reports she has had diarrhea for the past 2 weeks. Upon admittance pt was found to be hyponatremic and have a Ca level of 7.7. Diarrhea and hyponatremia treated with IV fluids. Head and abdominal CT negative for any acute findings. X-ray negative for any acute cardipulmonary findings. Pt scheduled to have EEG to monitor for sezuire acitvity. Pt has PMH of stroke, HTM, DVT, DM, CHF, breast CA resulting in R mastectomy.     Clinical Impression  Kristen Barron admitted for the above and presents with the following deficits listed below (see PT problem list). Pt states she lives with her ex husband and daughter in a mobile home with a ramp. Home is accessible for RW, which pt currently uses, other than the bathroom where she uses either a cane or counter top. Pt did not require any physical assist and SPT provided min guard for all aspects of mobility.  In supine pt's BP 141/46, sitting at EOB160/55, standing 136/48, standing 3 minutes 159/48, sitting in recliner 143/54. Pt reported initial dizziness of 7/10 upon STS, with standing rest break decreased to 4/10, pt ambulated 15 ft and was instructed to sit in recliner when pt stated 8/10 dizziness in sitting. SpO2 remained at or above 96% when reported dizziness in chair. 0/10 dizziness following 1 min rest break in chair. RN notified o dizziness and +orthostatics. Pt would benefit from HHPT upon discharge to address activity tolerance and energy conservation upon discharge.      Follow Up Recommendations Home health PT;Supervision for mobility/OOB    Equipment Recommendations  Other (comment)(shower seat)    Recommendations for Other  Services       Precautions / Restrictions Precautions Precautions: Fall;Other (comment)(+ orthostatic) Restrictions Weight Bearing Restrictions: No      Mobility  Bed Mobility Overal bed mobility: Needs Assistance Bed Mobility: Supine to Sit     Supine to sit: Min guard;HOB elevated     General bed mobility comments: Pt was able to get to EOB without requiring physical assist. SPT min guard for safety.  Transfers Overall transfer level: Needs assistance Equipment used: Rolling walker (2 wheeled) Transfers: Sit to/from Stand Sit to Stand: Min guard         General transfer comment: Pt was able to STS without requiring any physical assist. SPT min guard for safety.  Ambulation/Gait Ambulation/Gait assistance: Min guard Gait Distance (Feet): 16 Feet Assistive device: Rolling walker (2 wheeled) Gait Pattern/deviations: Step-through pattern;Decreased step length - right;Decreased step length - left;Wide base of support;Trunk flexed Gait velocity: decreased    General Gait Details: No physical assist required. VC provided for safe RW management and to maintain upright posture while ambulating. SPT min guard for safety. At end of ambulation pt reported being 8/10 dizzy.  Stairs            Wheelchair Mobility    Modified Rankin (Stroke Patients Only)       Balance Overall balance assessment: Needs assistance Sitting-balance support: Bilateral upper extremity supported;Feet supported Sitting balance-Leahy Scale: Poor Sitting balance - Comments: Pt unable to maintain sitting balance without support of BUE. SPT min guard for safety.   Standing balance support: Bilateral upper extremity supported;During  functional activity Standing balance-Leahy Scale: Poor Standing balance comment: Pt required BUE of RW for static and dynamic activity. SPT min guard for safety.                             Pertinent Vitals/Pain Pain Assessment: Faces Faces Pain  Scale: Hurts little more Pain Location: R/L knee Pain Descriptors / Indicators: Grimacing;Guarding;Moaning Pain Intervention(s): Limited activity within patient's tolerance;Monitored during session;Repositioned    Home Living Family/patient expects to be discharged to:: Private residence Living Arrangements: Children;Other relatives(ex husband ) Available Help at Discharge: Family;Available 24 hours/day(ex husband 24/7, duaghter after 3) Type of Home: Mobile home Home Access: Ramped entrance     Home Layout: One level Home Equipment: [place] - 2 wheels;Cane - quad;Bedside commode      Prior Function Level of Independence: Needs assistance   Gait / Transfers Assistance Needed: Pt reports that her doctors told her to stop walking 2 weeks ago, so she is currently only walking in her home. When walking she uses a RW, RW is accessible except bathroom in home in which pt uses a cane or counter tops. Pt reports she is no longer driving. Unable to state how many falls in the past 6 months or injuries resulted, pt states she has had multple recent falls.  ADL's / Homemaking Assistance Needed: Pt states she is independent with dressing and bathing. Her ex husband does all the cooking and cleaning and her daughter grocery shops for her.         Hand Dominance   Dominant Hand: Right    Extremity/Trunk Assessment   Upper Extremity Assessment Upper Extremity Assessment: Overall WFL for tasks assessed    Lower Extremity Assessment Lower Extremity Assessment: Generalized weakness RLE Deficits / Details: Pt able to perform 2x SLR without physical assist. LLE Deficits / Details: Pt able to perform x2 SLR without physical assist.     Cervical / Trunk Assessment Cervical / Trunk Assessment: Normal  Communication   Communication: No difficulties  Cognition Arousal/Alertness: Awake/alert Behavior During Therapy: WFL for tasks assessed/performed Overall Cognitive Status: Within Functional  Limits for tasks assessed                                        General Comments General comments (skin integrity, edema, etc.): SPT min guard for all aspect of mobility. In supine pt's BP 141/46, sitting at EOB160/55, standing 136/48, standing 3 minutes 159/48, sitting in recliner 143/54. Pt reported initial dizziness of 7/10 upon STS, with standing rest break decreased to 5/10, pt ambulated 15 ft and was instructed to sit in recliner when pt stated 8/10 dizziness in sitting. SpO2 remained at or above 96% when reported dizziness in chair. 0/10 dizziness following 1 min rest break in chair. RN notified o dizziness and +orthostatics.    Exercises Other Exercises Other Exercises: Pt instructed to perform x2 STS with VC for controlled decent.   Assessment/Plan    PT Assessment Patient needs continued PT services  PT Problem List Decreased strength;Decreased range of motion;Decreased activity tolerance;Decreased balance;Decreased mobility;Decreased knowledge of use of DME;Decreased safety awareness;Decreased knowledge of precautions;Cardiopulmonary status limiting activity;Pain       PT Treatment Interventions DME instruction;Gait training;Functional mobility training;Therapeutic activities;Therapeutic exercise;Balance training;Neuromuscular re-education;Patient/family education;Wheelchair mobility training    PT Goals (Current goals can be found in the Care Plan section)  Acute Rehab PT Goals Patient Stated Goal: to get home  PT Goal Formulation: With patient Time For Goal Achievement: 09/29/18 Potential to Achieve Goals: Fair    Frequency Min 2X/week   Barriers to discharge        Co-evaluation               AM-PAC PT "6 Clicks" Mobility  Outcome Measure Help needed turning from your back to your side while in a flat bed without using bedrails?: A Little Help needed moving from lying on your back to sitting on the side of a flat bed without using bedrails?:  A Little Help needed moving to and from a bed to a chair (including a wheelchair)?: A Little Help needed standing up from a chair using your arms (e.g., wheelchair or bedside chair)?: A Little Help needed to walk in hospital room?: A Little Help needed climbing 3-5 steps with a railing? : A Lot 6 Click Score: 17    End of Session Equipment Utilized During Treatment: Gait belt Activity Tolerance: Treatment limited secondary to medical complications (Comment);Patient limited by pain(dizziness ) Patient left: in chair;with call bell/phone within reach;with chair alarm set Nurse Communication: Mobility status;Other (comment)(+ orthostatic, dizziness) PT Visit Diagnosis: Other abnormalities of gait and mobility (R26.89);Repeated falls (R29.6);History of falling (Z91.81);Muscle weakness (generalized) (M62.81);Pain;Dizziness and giddiness (R42);Unsteadiness on feet (R26.81) Pain - Right/Left: (right and left) Pain - part of body: Knee    Time: 4081-4481 PT Time Calculation (min) (ACUTE ONLY): 34 min   Charges:              Dorothy Spark, SPT  09/15/2018, 2:21 PM

## 2018-09-15 NOTE — Progress Notes (Signed)
Anticoagulation monitoring(Lovenox):  85yo  F ordered Lovenox 30 mg Q24h  Filed Weights   09/14/18 1634 09/15/18 0043  Weight: 170 lb (77.1 kg) 177 lb 14.6 oz (80.7 kg)   BMI 33.6   Lab Results  Component Value Date   CREATININE 1.19 (H) 09/15/2018   CREATININE 1.49 (H) 09/14/2018   CREATININE 1.20 (H) 09/06/2018   Estimated Creatinine Clearance: 32.1 mL/min (A) (by C-G formula based on SCr of 1.19 mg/dL (H)). Hemoglobin & Hematocrit     Component Value Date/Time   HGB 8.4 (L) 09/15/2018 0409   HGB 9.7 (L) 11/28/2014 1244   HCT 25.7 (L) 09/15/2018 0409   HCT 29.9 (L) 11/28/2014 1244     Per Protocol for Patient with estCrcl > 30 ml/min and BMI < 40, will transition to Lovenox 40 mg Q24h      Chinita Greenland PharmD Clinical Pharmacist 09/15/2018

## 2018-09-15 NOTE — Progress Notes (Signed)
[facility] [place] NAME: Kristen Barron    MR#:  [id number]  DATE OF BIRTH:  July 16, 1929  SUBJECTIVE:    REVIEW OF SYSTEMS:   ROS Tolerating Diet: Tolerating PT:   DRUG ALLERGIES:  No Known Allergies  VITALS:  Blood pressure (!) 174/46, pulse 68, temperature 98.8 F (37.1 C), temperature source Oral, resp. rate 20, height 5\' 1"  (1.549 m), weight 80.7 kg, SpO2 96 %.  PHYSICAL EXAMINATION:   Physical Exam  GENERAL:  85 y.o.-year-old patient lying in the bed with no acute distress.  EYES: Pupils equal, round, reactive to light and accommodation. No scleral icterus. Extraocular muscles intact.  HEENT: Head atraumatic, normocephalic. Oropharynx and nasopharynx clear.  NECK:  Supple, no jugular venous distention. No thyroid enlargement, no tenderness.  LUNGS: Normal breath sounds bilaterally, no wheezing, rales, rhonchi. No use of accessory muscles of respiration.  CARDIOVASCULAR: S1, S2 normal. No murmurs, rubs, or gallops.  ABDOMEN: Soft, nontender, nondistended. Bowel sounds present. No organomegaly or mass.  EXTREMITIES: No cyanosis, clubbing or edema b/l.    NEUROLOGIC: Cranial nerves II through XII are intact. No focal Motor or sensory deficits b/l.   PSYCHIATRIC:  patient is alert and oriented x 3.  SKIN: No obvious rash, lesion, or ulcer.   LABORATORY PANEL:  CBC Recent Labs  Lab 09/15/18 0409  WBC 3.7*  HGB 8.4*  HCT 25.7*  PLT 221    Chemistries  Recent Labs  Lab 09/15/18 0409  NA 131*  K 4.3  CL 100  CO2 25  GLUCOSE 111*  BUN 21  CREATININE 1.19*  CALCIUM 7.7*  AST 18  ALT 10  ALKPHOS 51  BILITOT 0.5   Cardiac Enzymes Recent Labs  Lab 09/14/18 1641  TROPONINI <0.03   RADIOLOGY:  Ct Abdomen Pelvis Wo Contrast  Result Date: 09/14/2018 CLINICAL DATA:  Syncopal episode on toilet. Diarrhea, nausea and vomiting. History of breast cancer, hysterectomy and hemorrhoids. EXAM: CT ABDOMEN AND PELVIS  WITHOUT CONTRAST TECHNIQUE: Multidetector CT imaging of the abdomen and pelvis was performed following the standard protocol without IV contrast. COMPARISON:  CT abdomen and pelvis November 23, 2016 FINDINGS: LOWER CHEST: Mucoid impaction RIGHT > LEFT lower lobe bronchi. Heart is at least mildly enlarged. Severe coronary artery calcifications. No pericardial effusion. Similar nodular thickening RIGHT hemidiaphragm. HEPATOBILIARY: Mild 1 cm gallstone with additional tiny layering gallbladder sludge versus gallstones. No CT findings of acute cholecystitis. Calcified hepatic granulomas. PANCREAS: Fatty atrophy. SPLEEN: Nonacute.  Calcified splenic granulomas. ADRENALS/URINARY TRACT: Kidneys are orthotopic, demonstrating normal size and morphology. No nephrolithiasis, hydronephrosis; limited assessment for renal masses by nonenhanced CT. 16 mm hypodense exophytic RIGHT upper pole cyst. 13 mm RIGHT lower pole cyst. The unopacified ureters are normal in course and caliber. Urinary bladder is well distended and unremarkable. Normal adrenal glands. STOMACH/BOWEL: The stomach, small and large bowel are normal in course and caliber without inflammatory changes. Enteric contrast has not yet reached the distal small bowel. Moderate sigmoid colonic diverticulosis. Small hiatal hernia. The appendix is not discretely identified, however there are no inflammatory changes in the right lower quadrant. VASCULAR/LYMPHATIC: Aortoiliac vessels are normal in course and caliber. Moderate calcific atherosclerosis. No lymphadenopathy by CT size criteria. REPRODUCTIVE: Status post hysterectomy. OTHER: No intraperitoneal free fluid or free air. Moderate fat containing umbilical hernia. MUSCULOSKELETAL: Non-acute. Osteopenia. Severe lumbar spondylosis, status post L4-5 disc prosthesis with subsidence. IMPRESSION: 1. No acute intra-abdominal/pelvic process. 2. Colonic diverticulosis and cholelithiasis. Aortic Atherosclerosis (ICD10-I70.0).  Electronically Signed   By: Elon Alas M.D.   On: 09/14/2018 18:58   Ct Head Wo Contrast  Result Date: 09/14/2018 CLINICAL DATA:  Syncopal episode today. EXAM: CT HEAD WITHOUT CONTRAST CT CERVICAL SPINE WITHOUT CONTRAST TECHNIQUE: Multidetector CT imaging of the head and cervical spine was performed following the standard protocol without intravenous contrast. Multiplanar CT image reconstructions of the cervical spine were also generated. COMPARISON:  Head CT from 2014 and brain MRI 11/25/2016 FINDINGS: CT HEAD FINDINGS Brain: Stable age related cerebral atrophy, ventriculomegaly and periventricular white matter disease. No extra-axial fluid collections are identified. No CT findings for acute hemispheric infarction or intracranial hemorrhage. No mass lesions. The brainstem and cerebellum are normal. Vascular: Stable vascular calcifications but no definite aneurysm or hyperdense vessels. Skull: No skull fracture or bone lesion. Sinuses/Orbits: Both half sphenoid sinus disease. The other paranasal sinuses are grossly clear. The mastoid air cells and middle ear cavities are clear. The globes are intact. Other: No scalp lesions or hematoma. CT CERVICAL SPINE FINDINGS Alignment: Advanced degenerative cervical spondylosis with multilevel disc disease and facet disease. Mild multilevel degenerative subluxations are noted. Skull base and vertebrae: The skull base C1 and C1-2 articulations are maintained. There are advanced degenerative changes at C1-2 with calcified pannus formation and mild mass effect on the upper cervical thecal sac. No acute fracture is identified. Soft tissues and spinal canal: No prevertebral fluid or swelling. No visible canal hematoma. Disc levels: Generous spinal canal. There is multilevel bony foraminal narrowing due to uncinate spurring and facet disease. Upper chest: The lung apices are grossly clear. Other: Bilateral carotid artery calcifications are noted along with calcifications  of the aortic branch vessels. Small thyroid nodules are noted. No neck mass or adenopathy. IMPRESSION: 1. No acute intracranial findings. No skull fracture. Stable age related cerebral atrophy, ventriculomegaly and periventricular white matter disease. 2. 3. Advanced degenerative cervical spondylosis with multilevel disc disease and facet disease but no acute fracture. Electronically Signed   By: Marijo Sanes M.D.   On: 09/14/2018 17:18   Ct Cervical Spine Wo Contrast  Result Date: 09/14/2018 CLINICAL DATA:  Syncopal episode today. EXAM: CT HEAD WITHOUT CONTRAST CT CERVICAL SPINE WITHOUT CONTRAST TECHNIQUE: Multidetector CT imaging of the head and cervical spine was performed following the standard protocol without intravenous contrast. Multiplanar CT image reconstructions of the cervical spine were also generated. COMPARISON:  Head CT from 2014 and brain MRI 11/25/2016 FINDINGS: CT HEAD FINDINGS Brain: Stable age related cerebral atrophy, ventriculomegaly and periventricular white matter disease. No extra-axial fluid collections are identified. No CT findings for acute hemispheric infarction or intracranial hemorrhage. No mass lesions. The brainstem and cerebellum are normal. Vascular: Stable vascular calcifications but no definite aneurysm or hyperdense vessels. Skull: No skull fracture or bone lesion. Sinuses/Orbits: Both half sphenoid sinus disease. The other paranasal sinuses are grossly clear. The mastoid air cells and middle ear cavities are clear. The globes are intact. Other: No scalp lesions or hematoma. CT CERVICAL SPINE FINDINGS Alignment: Advanced degenerative cervical spondylosis with multilevel disc disease and facet disease. Mild multilevel degenerative subluxations are noted. Skull base and vertebrae: The skull base C1 and C1-2 articulations are maintained. There are advanced degenerative changes at C1-2 with calcified pannus formation and mild mass effect on the upper cervical thecal sac. No  acute fracture is identified. Soft tissues and spinal canal: No prevertebral fluid or swelling. No visible canal hematoma. Disc levels: Generous spinal canal. There is multilevel bony foraminal narrowing due to uncinate spurring  and facet disease. Upper chest: The lung apices are grossly clear. Other: Bilateral carotid artery calcifications are noted along with calcifications of the aortic branch vessels. Small thyroid nodules are noted. No neck mass or adenopathy. IMPRESSION: 1. No acute intracranial findings. No skull fracture. Stable age related cerebral atrophy, ventriculomegaly and periventricular white matter disease. 2. 3. Advanced degenerative cervical spondylosis with multilevel disc disease and facet disease but no acute fracture. Electronically Signed   By: Marijo Sanes M.D.   On: 09/14/2018 17:18   Dg Chest Portable 1 View  Result Date: 09/14/2018 CLINICAL DATA:  Syncopal episode. EXAM: PORTABLE CHEST 1 VIEW COMPARISON:  09/06/2018 FINDINGS: The the cardiac silhouette, mediastinal and hilar contours are within normal limits and stable given the AP projection and portable technique. Stable mild tortuosity and calcification of the thoracic aorta. The lungs are clear. No pleural effusion. No worrisome pulmonary lesions. Stable calcified granuloma noted in the left upper lobe. IMPRESSION: No acute cardiopulmonary findings. Electronically Signed   By: Marijo Sanes M.D.   On: 09/14/2018 17:06   ASSESSMENT AND PLAN:    Jenefer Woerner  is a 85 y.o. female with a known history of CHF, diabetes mellitus, hypertension chronic renal insufficiency chronic hyponatremia is brought into the ED after she sustained a syncopal episode.  According to the granddaughter at bedside she has been having profuse watery diarrhea.    #Acute kidney injury due to GI losses/ diarrhea Gentle hydration with IV fluids Avoid nephrotoxins Creat better  #Diarrhea--resolved Check stool for C. difficile toxin and GI  panel--pending since pt has had no diarrhe -she is on po keflex for skin infeciton  Hydrate with IV fluids  #Hypertensive urgency Resume home medications metoprolol, hydralazine and titrate as needed IV Lopressor as needed  #Hyponatremia seem to be chronic  #Diabetes mellitus Resume home medication Levemir and sliding scale insulin  #Syncope with generalized weakness   probably from dehydration from diarrhea/ ?vasovagal--had it while she was on the commode PT evaluation--HHPT  #Left leg skin lesion-resection of squamous cell cancer Continue Keflex and granddaughter will take care of the dressing tomorrow  labs stable If shows improvement will d/c tomorrow. t agreeable  Case discussed with Care Management/Social Worker. Management plans discussed with the patient, family and they are in agreement.  CODE STATUS: DNR  DVT Prophylaxis: lovenox  TOTAL TIME TAKING CARE OF THIS PATIENT: *30* minutes.  >50% time spent on counselling and coordination of care  POSSIBLE D/C IN *1-2* DAYS, DEPENDING ON CLINICAL CONDITION.  Note: This dictation was prepared with Dragon dictation along with smaller phrase technology. Any transcriptional errors that result from this process are unintentional.  Fritzi Mandes M.D on 09/15/2018 at 2:31 PM  Between 7am to 6pm - Pager - [phone]  After 6pm go to [email or web address] - password [email or web address] [facility]  Office  [phone]  CC: Primary care physician; Denton Lank, MDPatient ID: [name], female   DOB: 01-28-1929, 85 y.o.   MRN: [id number]

## 2018-09-16 LAB — GLUCOSE, CAPILLARY
GLUCOSE-CAPILLARY: 103 mg/dL — AB (ref 70–99)
Glucose-Capillary: 134 mg/dL — ABNORMAL HIGH (ref 70–99)

## 2018-09-16 MED ORDER — METOPROLOL SUCCINATE ER 50 MG PO TB24: 50.0000 mg | ORAL_TABLET | Freq: Every day | ORAL | 1 refills | Status: AC

## 2018-09-16 MED ORDER — METOPROLOL SUCCINATE ER 100 MG PO TB24: 50.0000 mg | ORAL_TABLET | Freq: Every day | ORAL | 0 refills | Status: DC

## 2018-09-16 MED ORDER — METOPROLOL SUCCINATE ER 50 MG PO TB24: 50.0000 mg | ORAL_TABLET | Freq: Every day | ORAL | Status: DC

## 2018-09-16 NOTE — Care Management Obs Status (Signed)
[facility] NOTIFICATION   Patient Details  Name: AMBERLI RUEGG MRN: [id number] Date of Birth: February 16, 1929   Medicare Observation Status Notification Given:  Yes; explained     [name], RN 09/16/2018, 9:02 AM

## 2018-09-16 NOTE — Discharge Summary (Signed)
[facility] NAME: Kristen Barron    MR#:  [id number]  DATE OF BIRTH:  Jan 20, 1929  DATE OF ADMISSION:  09/14/2018 ADMITTING PHYSICIAN: Nicholes Mango, MD  DATE OF DISCHARGE: 09/16/2018  PRIMARY CARE PHYSICIAN: Denton Lank, MD    ADMISSION DIAGNOSIS:  Syncope and collapse [R55] Hyponatremia [E87.1] Worsening renal function [N28.9] Diarrhea, unspecified type [R19.7]  DISCHARGE DIAGNOSIS:   Acute Diarrhea suspected viral Mild dehydration Generalized weakness SECONDARY DIAGNOSIS:   Past Medical History:  Diagnosis Date  . Arthritis   . Breast cancer ([place]) 03/06/2014   right breast with mastectomy and chemo  . Cancer [facility])    Reports lumpectomy for a cyst  . CHF (congestive heart failure) ([place])   . Collagen vascular disease ([place])   . Diabetes ([place])   . DVT (deep venous thrombosis) ([place])   . Hemorrhoids   . Hypertension   . Renal disorder   . Renal insufficiency   . Stroke [facility])    August 2014, but has had strokes prior as well    HOSPITAL COURSE:  [name] a85 y.o.femalewith a known history of CHF, diabetes mellitus, hypertension chronic renal insufficiency chronic hyponatremia is brought into the ED after she sustained a syncopal episode. According to the granddaughter at bedside she has been having profuse watery diarrhea.    #Acute kidney injury due to GI losses/ diarrhea Received Gentle hydration with IV fluids Avoid nephrotoxins Creat stable  #Diarrhea--resolved -stool for C. difficile toxin and GI panel negative.  -she was on po keflex for infection recently CT abd nothing acute incidental smal GB stone. LFTs ok. Pt has no vomiting  #Hypertensive urgency Resume home medications metoprolol, hydralazine and titrate as needed IV  Decreased metoprolol to 50 mg daily due to HR 53-66 and some dizziness when pt came  #Hyponatremia seem to be chronic  #Diabetes mellitus Resume home medication  Levemir and sliding scale insulin  #Syncope with generalized weakness probably from dehydration from diarrhea/ ?vasovagal--had it while she was on the commode PT evaluation--HHPT--pt refused PT. Daughter aware of it -CT head --nothing acure CT cervical spine--chronic DJD changes  #Left leg skin lesion-resection of squamous cell cancer Completed Keflex and granddaughter will take care of the dressing at home  D/w pt and dter. Will ambulate with her RW and if remains ok d/c home later today   CONSULTS OBTAINED:    DRUG ALLERGIES:  No Known Allergies  DISCHARGE MEDICATIONS:   Allergies as of 09/16/2018   No Known Allergies     Medication List    STOP taking these medications   cephALEXin 500 MG capsule Commonly known as:  KEFLEX     TAKE these medications   benazepril 40 MG tablet Commonly known as:  LOTENSIN Take 1 tablet (40 mg total) by mouth daily.   clopidogrel 75 MG tablet Commonly known as:  PLAVIX Take 75 mg by mouth daily with breakfast.   dicyclomine 10 MG capsule Commonly known as:  BENTYL Take 10 mg by mouth 4 (four) times daily as needed.   gabapentin 600 MG tablet Commonly known as:  NEURONTIN Take 600 mg by mouth 4 (four) times daily.   Glucosamine 500 MG Caps Take 500 mg by mouth 3 (three) times daily.   hydrALAZINE 50 MG tablet Commonly known as:  APRESOLINE Take 50 mg by mouth 3 (three) times daily.   insulin aspart 100 UNIT/ML injection Commonly known as:  novoLOG Inject 4 Units into the skin  3 (three) times daily before meals. Patient is only taking with biggest meal   insulin detemir 100 UNIT/ML injection Commonly known as:  LEVEMIR Inject 18 Units into the skin at bedtime.   magnesium oxide 400 MG tablet Commonly known as:  MAG-OX Take 400 mg by mouth 2 (two) times daily.   metoprolol succinate 50 MG 24 hr tablet Commonly known as:  TOPROL-XL Take 1 tablet (50 mg total) by mouth daily. Take with or immediately following a  meal. What changed:    medication strength  how much to take   NORCO 5-325 MG tablet Generic drug:  HYDROcodone-acetaminophen Take 1 tablet by mouth every 6 (six) hours as needed (back pain).   Omega 3 1200 MG Caps Take 1 capsule by mouth daily.   ondansetron 4 MG disintegrating tablet Commonly known as:  ZOFRAN-ODT Take 4 mg by mouth every 8 (eight) hours as needed for nausea or vomiting.   pantoprazole 40 MG tablet Commonly known as:  PROTONIX Take 40 mg by mouth daily.   pravastatin 80 MG tablet Commonly known as:  PRAVACHOL Take 80 mg by mouth every evening.   torsemide 20 MG tablet Commonly known as:  DEMADEX Take 40 mg by mouth daily.       If you experience worsening of your admission symptoms, develop shortness of breath, life threatening emergency, suicidal or homicidal thoughts you must seek medical attention immediately by calling 911 or calling your MD immediately  if symptoms less severe.  You Must read complete instructions/literature along with all the possible adverse reactions/side effects for all the Medicines you take and that have been prescribed to you. Take any new Medicines after you have completely understood and accept all the possible adverse reactions/side effects.   Please note  You were cared for by a hospitalist during your hospital stay. If you have any questions about your discharge medications or the care you received while you were in the hospital after you are discharged, you can call the unit and asked to speak with the hospitalist on call if the hospitalist that took care of you is not available. Once you are discharged, your primary care physician will handle any further medical issues. Please note that NO REFILLS for any discharge medications will be authorized once you are discharged, as it is imperative that you return to your primary care physician (or establish a relationship with a primary care physician if you do not have one) for  your aftercare needs so that they can reassess your need for medications and monitor your lab values. Today   SUBJECTIVE   Felt little sweaty this am. Ate some BF. dter in the room  VITAL SIGNS:  Blood pressure (!) 195/67, pulse 66, temperature 98.2 F (36.8 C), temperature source Oral, resp. rate 18, height 5\' 1"  (1.549 m), weight 80.7 kg, SpO2 96 %.  I/O:    Intake/Output Summary (Last 24 hours) at 09/16/2018 1001 Last data filed at 09/16/2018 0208 Gross per 24 hour  Intake 752.83 ml  Output 775 ml  Net -22.17 ml    PHYSICAL EXAMINATION:  GENERAL:  85 y.o.-year-old patient lying in the bed with no acute distress.  EYES: Pupils equal, round, reactive to light and accommodation. No scleral icterus. Extraocular muscles intact.  HEENT: Head atraumatic, normocephalic. Oropharynx and nasopharynx clear.  NECK:  Supple, no jugular venous distention. No thyroid enlargement, no tenderness.  LUNGS: Normal breath sounds bilaterally, no wheezing, rales,rhonchi or crepitation. No use of accessory muscles of  respiration.  CARDIOVASCULAR: S1, S2 normal. No murmurs, rubs, or gallops.  ABDOMEN: Soft, non-tender, non-distended. Bowel sounds present. No organomegaly or mass.  EXTREMITIES: No pedal edema, cyanosis, or clubbing.  NEUROLOGIC: Cranial nerves II through XII are intact. Muscle strength 5/5 in all extremities. Sensation intact. Gait not checked.  PSYCHIATRIC: The patient is alert and oriented x 3.  SKIN: No obvious rash, lesion, or ulcer.   DATA REVIEW:   CBC  Recent Labs  Lab 09/15/18 0409  WBC 3.7*  HGB 8.4*  HCT 25.7*  PLT 221    Chemistries  Recent Labs  Lab 09/15/18 0409  NA 131*  K 4.3  CL 100  CO2 25  GLUCOSE 111*  BUN 21  CREATININE 1.19*  CALCIUM 7.7*  AST 18  ALT 10  ALKPHOS 49  BILITOT 0.5    Microbiology Results   Recent Results (from the past 240 hour(s))  Gastrointestinal Panel by PCR , Stool     Status: None   Collection Time: 09/15/18  3:33 PM   Result Value Ref Range Status   Campylobacter species NOT DETECTED NOT DETECTED Final   Plesimonas shigelloides NOT DETECTED NOT DETECTED Final   Salmonella species NOT DETECTED NOT DETECTED Final   Yersinia enterocolitica NOT DETECTED NOT DETECTED Final   Vibrio species NOT DETECTED NOT DETECTED Final   Vibrio cholerae NOT DETECTED NOT DETECTED Final   Enteroaggregative E coli (EAEC) NOT DETECTED NOT DETECTED Final   Enteropathogenic E coli (EPEC) NOT DETECTED NOT DETECTED Final   Enterotoxigenic E coli (ETEC) NOT DETECTED NOT DETECTED Final   Shiga like toxin producing E coli (STEC) NOT DETECTED NOT DETECTED Final   Shigella/Enteroinvasive E coli (EIEC) NOT DETECTED NOT DETECTED Final   Cryptosporidium NOT DETECTED NOT DETECTED Final   Cyclospora cayetanensis NOT DETECTED NOT DETECTED Final   Entamoeba histolytica NOT DETECTED NOT DETECTED Final   Giardia lamblia NOT DETECTED NOT DETECTED Final   Adenovirus F40/41 NOT DETECTED NOT DETECTED Final   Astrovirus NOT DETECTED NOT DETECTED Final   Norovirus GI/GII NOT DETECTED NOT DETECTED Final   Rotavirus A NOT DETECTED NOT DETECTED Final   Sapovirus (I, II, IV, and V) NOT DETECTED NOT DETECTED Final    Comment: Performed at [facility], [address] [place], [place] [address]  C difficile quick scan w PCR reflex     Status: None   Collection Time: 09/15/18  3:33 PM  Result Value Ref Range Status   C Diff antigen NEGATIVE NEGATIVE Final   C Diff toxin NEGATIVE NEGATIVE Final   C Diff interpretation No C. difficile detected.  Final    Comment: Performed at [facility], [address] [place], [place] [address]    RADIOLOGY:  Ct Abdomen Pelvis Wo Contrast  Result Date: 09/14/2018 CLINICAL DATA:  Syncopal episode on toilet. Diarrhea, nausea and vomiting. History of breast cancer, hysterectomy and hemorrhoids. EXAM: CT ABDOMEN AND PELVIS WITHOUT CONTRAST TECHNIQUE: Multidetector CT imaging of the abdomen and  pelvis was performed following the standard protocol without IV contrast. COMPARISON:  CT abdomen and pelvis November 23, 2016 FINDINGS: LOWER CHEST: Mucoid impaction RIGHT > LEFT lower lobe bronchi. Heart is at least mildly enlarged. Severe coronary artery calcifications. No pericardial effusion. Similar nodular thickening RIGHT hemidiaphragm. HEPATOBILIARY: Mild 1 cm gallstone with additional tiny layering gallbladder sludge versus gallstones. No CT findings of acute cholecystitis. Calcified hepatic granulomas. PANCREAS: Fatty atrophy. SPLEEN: Nonacute.  Calcified splenic granulomas. ADRENALS/URINARY TRACT: Kidneys are orthotopic, demonstrating normal  size and morphology. No nephrolithiasis, hydronephrosis; limited assessment for renal masses by nonenhanced CT. 16 mm hypodense exophytic RIGHT upper pole cyst. 13 mm RIGHT lower pole cyst. The unopacified ureters are normal in course and caliber. Urinary bladder is well distended and unremarkable. Normal adrenal glands. STOMACH/BOWEL: The stomach, small and large bowel are normal in course and caliber without inflammatory changes. Enteric contrast has not yet reached the distal small bowel. Moderate sigmoid colonic diverticulosis. Small hiatal hernia. The appendix is not discretely identified, however there are no inflammatory changes in the right lower quadrant. VASCULAR/LYMPHATIC: Aortoiliac vessels are normal in course and caliber. Moderate calcific atherosclerosis. No lymphadenopathy by CT size criteria. REPRODUCTIVE: Status post hysterectomy. OTHER: No intraperitoneal free fluid or free air. Moderate fat containing umbilical hernia. MUSCULOSKELETAL: Non-acute. Osteopenia. Severe lumbar spondylosis, status post L4-5 disc prosthesis with subsidence. IMPRESSION: 1. No acute intra-abdominal/pelvic process. 2. Colonic diverticulosis and cholelithiasis. Aortic Atherosclerosis (ICD10-I70.0). Electronically Signed   By: Elon Alas M.D.   On: 09/14/2018 18:58   Ct  Head Wo Contrast  Result Date: 09/14/2018 CLINICAL DATA:  Syncopal episode today. EXAM: CT HEAD WITHOUT CONTRAST CT CERVICAL SPINE WITHOUT CONTRAST TECHNIQUE: Multidetector CT imaging of the head and cervical spine was performed following the standard protocol without intravenous contrast. Multiplanar CT image reconstructions of the cervical spine were also generated. COMPARISON:  Head CT from 2014 and brain MRI 11/25/2016 FINDINGS: CT HEAD FINDINGS Brain: Stable age related cerebral atrophy, ventriculomegaly and periventricular white matter disease. No extra-axial fluid collections are identified. No CT findings for acute hemispheric infarction or intracranial hemorrhage. No mass lesions. The brainstem and cerebellum are normal. Vascular: Stable vascular calcifications but no definite aneurysm or hyperdense vessels. Skull: No skull fracture or bone lesion. Sinuses/Orbits: Both half sphenoid sinus disease. The other paranasal sinuses are grossly clear. The mastoid air cells and middle ear cavities are clear. The globes are intact. Other: No scalp lesions or hematoma. CT CERVICAL SPINE FINDINGS Alignment: Advanced degenerative cervical spondylosis with multilevel disc disease and facet disease. Mild multilevel degenerative subluxations are noted. Skull base and vertebrae: The skull base C1 and C1-2 articulations are maintained. There are advanced degenerative changes at C1-2 with calcified pannus formation and mild mass effect on the upper cervical thecal sac. No acute fracture is identified. Soft tissues and spinal canal: No prevertebral fluid or swelling. No visible canal hematoma. Disc levels: Generous spinal canal. There is multilevel bony foraminal narrowing due to uncinate spurring and facet disease. Upper chest: The lung apices are grossly clear. Other: Bilateral carotid artery calcifications are noted along with calcifications of the aortic branch vessels. Small thyroid nodules are noted. No neck mass or  adenopathy. IMPRESSION: 1. No acute intracranial findings. No skull fracture. Stable age related cerebral atrophy, ventriculomegaly and periventricular white matter disease. 2. 3. Advanced degenerative cervical spondylosis with multilevel disc disease and facet disease but no acute fracture. Electronically Signed   By: Marijo Sanes M.D.   On: 09/14/2018 17:18   Ct Cervical Spine Wo Contrast  Result Date: 09/14/2018 CLINICAL DATA:  Syncopal episode today. EXAM: CT HEAD WITHOUT CONTRAST CT CERVICAL SPINE WITHOUT CONTRAST TECHNIQUE: Multidetector CT imaging of the head and cervical spine was performed following the standard protocol without intravenous contrast. Multiplanar CT image reconstructions of the cervical spine were also generated. COMPARISON:  Head CT from 2014 and brain MRI 11/25/2016 FINDINGS: CT HEAD FINDINGS Brain: Stable age related cerebral atrophy, ventriculomegaly and periventricular white matter disease. No extra-axial fluid collections are identified. No  CT findings for acute hemispheric infarction or intracranial hemorrhage. No mass lesions. The brainstem and cerebellum are normal. Vascular: Stable vascular calcifications but no definite aneurysm or hyperdense vessels. Skull: No skull fracture or bone lesion. Sinuses/Orbits: Both half sphenoid sinus disease. The other paranasal sinuses are grossly clear. The mastoid air cells and middle ear cavities are clear. The globes are intact. Other: No scalp lesions or hematoma. CT CERVICAL SPINE FINDINGS Alignment: Advanced degenerative cervical spondylosis with multilevel disc disease and facet disease. Mild multilevel degenerative subluxations are noted. Skull base and vertebrae: The skull base C1 and C1-2 articulations are maintained. There are advanced degenerative changes at C1-2 with calcified pannus formation and mild mass effect on the upper cervical thecal sac. No acute fracture is identified. Soft tissues and spinal canal: No prevertebral  fluid or swelling. No visible canal hematoma. Disc levels: Generous spinal canal. There is multilevel bony foraminal narrowing due to uncinate spurring and facet disease. Upper chest: The lung apices are grossly clear. Other: Bilateral carotid artery calcifications are noted along with calcifications of the aortic branch vessels. Small thyroid nodules are noted. No neck mass or adenopathy. IMPRESSION: 1. No acute intracranial findings. No skull fracture. Stable age related cerebral atrophy, ventriculomegaly and periventricular white matter disease. 2. 3. Advanced degenerative cervical spondylosis with multilevel disc disease and facet disease but no acute fracture. Electronically Signed   By: Marijo Sanes M.D.   On: 09/14/2018 17:18   Dg Chest Portable 1 View  Result Date: 09/14/2018 CLINICAL DATA:  Syncopal episode. EXAM: PORTABLE CHEST 1 VIEW COMPARISON:  09/06/2018 FINDINGS: The the cardiac silhouette, mediastinal and hilar contours are within normal limits and stable given the AP projection and portable technique. Stable mild tortuosity and calcification of the thoracic aorta. The lungs are clear. No pleural effusion. No worrisome pulmonary lesions. Stable calcified granuloma noted in the left upper lobe. IMPRESSION: No acute cardiopulmonary findings. Electronically Signed   By: Marijo Sanes M.D.   On: 09/14/2018 17:06     CODE STATUS:     Code Status Orders  (From admission, onward)         Start     Ordered   09/15/18 0044  Do not attempt resuscitation (DNR)  Continuous    Question Answer Comment  In the event of cardiac or respiratory ARREST Do not call a "code blue"   In the event of cardiac or respiratory ARREST Do not perform Intubation, CPR, defibrillation or ACLS   In the event of cardiac or respiratory ARREST Use medication by any route, position, wound care, and other measures to relive pain and suffering. May use oxygen, suction and manual treatment of airway obstruction as  needed for comfort.   Comments RN may pronounce```````````````````````      09/15/18 0043        Code Status History    Date Active Date Inactive Code Status Order ID Comments User Context   12/09/2017 2316 12/11/2017 1708 Full Code [id number]  Harrie Foreman, MD Inpatient   06/07/2017 1448 06/09/2017 1659 Full Code [id number]  Loletha Grayer, MD ED   11/23/2016 1726 11/25/2016 1842 Full Code [id number]  Bettey Costa, MD Inpatient   04/13/2016 0112 04/16/2016 1749 Full Code [id number]  Harvie Bridge, DO Inpatient   04/09/2016 0544 04/09/2016 1732 Full Code [id number]  Saundra Shelling, MD Inpatient   07/24/2013 1419 07/26/2013 2038 Full Code [id number]  Charlie Pitter, MD Inpatient   07/20/2013 1336 07/24/2013 1419 Full Code [id number]  Othella Boyer,  MD Inpatient      TOTAL TIME TAKING CARE OF THIS PATIENT: **40* minutes.    Fritzi Mandes M.D on 09/16/2018 at 10:01 AM  Between 7am to 6pm - Pager - [phone] After 6pm go to [email or web address] - password [email or web address] [facility]  Office  [phone]  CC: Primary care physician; Denton Lank, MD

## 2018-09-16 NOTE — Care Management Note (Addendum)
Case Management Note  Patient Details  Name: Kristen Barron MRN: [id number] Date of Birth: [birth year]  Subjective/Objective:  Admitted to [facility] with the diagnosis of acute kidney injury. Lives alone. Seen Dr. Posey Pronto last Monday . Prescriptions are filled at [facility] on [address]. Granddaughter [name] [phone]).  No home health. No skilled facility. No home oxygen. Rolling walker in the home. Takes care of all basic activities of daily living herself, drives.  [place] prior to this admission. Fair appetite. Family will transport.                  Action/Plan: Physical therapy is recommending home with home health/physical therapy. [place] query completed. One copy given to patient. One copy placed on chart.  Discussed agencies with daughter, [name] and patient. [place] at this time., but daughter wants to discuss with another family member.  Will update [name], [place] representative to be aware of request.   Expected Discharge Date:                  Expected Discharge Plan:     In-House Referral:   yes  Discharge planning Services   yes  Post Acute Care Choice:   yes Choice offered to:   patient/daughter  DME Arranged:    DME Agency:     HH Arranged:   yes [place] Agency:   [facility] aware.   Status of Service:     If discussed at Long Length of Stay Meetings, dates discussed:    Additional Comments:  [name], RN MSN CCM Care Management [phone] 09/16/2018, 9:30 AM

## 2018-09-16 NOTE — Care Management (Signed)
Declining Home Health services at this time. [facility] representative updated. [name] RN MSN CCM Care Management [phone]

## 2018-09-16 NOTE — Care Management CC44 (Signed)
Condition Code 44 Documentation Completed  Patient Details  Name: ASUNCION TAPSCOTT MRN: [id number] Date of Birth: 22-Oct-1928   Condition Code 44 given:  Yes Patient signature on Condition Code 44 notice:  Yes Documentation of 2 MD's agreement:  Yes Code 44 added to claim:  Yes    [name], RN 09/16/2018, 9:02 AM

## 2018-09-16 NOTE — Progress Notes (Signed)
Patient discharged home with daughter. IV removed. Discharge instructions and medications reviewed with patient.

## 2018-09-18 ENCOUNTER — Emergency Department: Payer: Medicare HMO

## 2018-09-18 ENCOUNTER — Emergency Department
Admission: EM | Admit: 2018-09-18 | Discharge: 2018-09-19 | Disposition: A | Discharge status: Home / Self Care | Payer: Medicare HMO | Attending: Emergency Medicine | Admitting: Emergency Medicine

## 2018-09-18 ENCOUNTER — Other Ambulatory Visit: Payer: Self-pay

## 2018-09-18 DIAGNOSIS — Z853 Personal history of malignant neoplasm of breast: Secondary | ICD-10-CM | POA: Diagnosis not present

## 2018-09-18 DIAGNOSIS — R1084 Generalized abdominal pain: Secondary | ICD-10-CM | POA: Insufficient documentation

## 2018-09-18 DIAGNOSIS — R404 Transient alteration of awareness: Secondary | ICD-10-CM | POA: Diagnosis not present

## 2018-09-18 DIAGNOSIS — I110 Hypertensive heart disease with heart failure: Secondary | ICD-10-CM | POA: Insufficient documentation

## 2018-09-18 DIAGNOSIS — E119 Type 2 diabetes mellitus without complications: Secondary | ICD-10-CM | POA: Insufficient documentation

## 2018-09-18 DIAGNOSIS — Z79899 Other long term (current) drug therapy: Secondary | ICD-10-CM | POA: Diagnosis not present

## 2018-09-18 DIAGNOSIS — N183 Chronic kidney disease, stage 3 (moderate): Secondary | ICD-10-CM | POA: Insufficient documentation

## 2018-09-18 DIAGNOSIS — I509 Heart failure, unspecified: Secondary | ICD-10-CM | POA: Diagnosis not present

## 2018-09-18 LAB — CBC WITH DIFFERENTIAL/PLATELET
Abs Immature Granulocytes: 0.08 10*3/uL — ABNORMAL HIGH (ref 0.00–0.07)
Basophils Absolute: 0 10*3/uL (ref 0.0–0.1)
Basophils Relative: 1 %
Eosinophils Absolute: 0.1 10*3/uL (ref 0.0–0.5)
Eosinophils Relative: 1 %
HEMATOCRIT: 31 % — AB (ref 36.0–46.0)
Hemoglobin: 9.8 g/dL — ABNORMAL LOW (ref 12.0–15.0)
Immature Granulocytes: 1 %
Lymphocytes Relative: 8 %
Lymphs Abs: 0.7 10*3/uL (ref 0.7–4.0)
MCH: 28.6 pg (ref 26.0–34.0)
MCHC: 31.6 g/dL (ref 30.0–36.0)
MCV: 90.4 fL (ref 80.0–100.0)
Monocytes Absolute: 0.5 10*3/uL (ref 0.1–1.0)
Monocytes Relative: 7 %
NEUTROS PCT: 82 %
Neutro Abs: 6.6 10*3/uL (ref 1.7–7.7)
Platelets: 291 10*3/uL (ref 150–400)
RBC: 3.43 MIL/uL — AB (ref 3.87–5.11)
RDW: 14.1 % (ref 11.5–15.5)
WBC: 8 10*3/uL (ref 4.0–10.5)
nRBC: 0 % (ref 0.0–0.2)

## 2018-09-18 LAB — URINALYSIS, COMPLETE (UACMP) WITH MICROSCOPIC
BACTERIA UA: NONE SEEN
Bilirubin Urine: NEGATIVE
Glucose, UA: NEGATIVE mg/dL
Hgb urine dipstick: NEGATIVE
Ketones, ur: NEGATIVE mg/dL
Leukocytes, UA: NEGATIVE
Nitrite: NEGATIVE
Protein, ur: 30 mg/dL — AB
Specific Gravity, Urine: 1.004 — ABNORMAL LOW (ref 1.005–1.030)
pH: 5 (ref 5.0–8.0)

## 2018-09-18 LAB — COMPREHENSIVE METABOLIC PANEL
ALT: 11 U/L (ref 0–44)
AST: 19 U/L (ref 15–41)
Albumin: 3.5 g/dL (ref 3.5–5.0)
Alkaline Phosphatase: 65 U/L (ref 38–126)
Anion gap: 8 (ref 5–15)
BUN: 17 mg/dL (ref 8–23)
CHLORIDE: 100 mmol/L (ref 98–111)
CO2: 26 mmol/L (ref 22–32)
Calcium: 8.2 mg/dL — ABNORMAL LOW (ref 8.9–10.3)
Creatinine, Ser: 1.23 mg/dL — ABNORMAL HIGH (ref 0.44–1.00)
GFR calc Af Amer: 46 mL/min — ABNORMAL LOW (ref >60)
GFR calc non Af Amer: 39 mL/min — ABNORMAL LOW (ref >60)
Glucose, Bld: 81 mg/dL (ref 70–99)
POTASSIUM: 4.1 mmol/L (ref 3.5–5.1)
SODIUM: 134 mmol/L — AB (ref 135–145)
Total Bilirubin: 0.5 mg/dL (ref 0.3–1.2)
Total Protein: 6.8 g/dL (ref 6.5–8.1)

## 2018-09-18 LAB — TROPONIN I: Troponin I: <0.03 ng/mL (ref <0.03)

## 2018-09-18 LAB — LIPASE, BLOOD: Lipase: 22 U/L (ref 11–51)

## 2018-09-18 MED ORDER — IOHEXOL 300 MG/ML  SOLN
75.0000 mL | Freq: Once | INTRAMUSCULAR | Status: AC | PRN
  Administered 2018-09-18: 75 mL via INTRAVENOUS

## 2018-09-18 MED ORDER — SODIUM CHLORIDE 0.9 % IV BOLUS
1000.0000 mL | Freq: Once | INTRAVENOUS | Status: AC
  Administered 2018-09-18: 1000 mL via INTRAVENOUS

## 2018-09-18 NOTE — ED Provider Notes (Signed)
[facility] Emergency Department Provider Note  ____________________________________________  Time seen: Approximately 4:27 PM  I have reviewed the triage vital signs and the nursing notes.   HISTORY  Chief Complaint Altered Mental Status    Level 5 Caveat: Portions of the History and Physical including HPI and review of systems are unable to be completely obtained due to patient being a poor historian   HPI Kristen Barron is a 85 y.o. female with a history of breast cancer CHF and hypertension who was brought to the ED today due to "not feeling right."  Her daughter felt that she was confused earlier today and seem to be having a hard time getting into her bed which is admittedly very high.  While sitting in the bed the patient seemed to have a hard time remaining upright and Leaning side to side.  She did not fall or get injured.  Denies any pain.  Denies shortness of breath vomiting or diarrhea.  She was recently hospitalized for syncope and diarrhea.  C. difficile was negative, CT head C-spine and noncontrast CT of the abdomen were negative according to electronic medical record.  An echocardiogram was not done.  MRI of the brain was not done.      Past Medical History:  Diagnosis Date  . Arthritis   . Breast cancer ([place]) 03/06/2014   right breast with mastectomy and chemo  . Cancer [facility])    Reports lumpectomy for a cyst  . CHF (congestive heart failure) ([place])   . Collagen vascular disease ([place])   . Diabetes ([place])   . DVT (deep venous thrombosis) ([place])   . Hemorrhoids   . Hypertension   . Renal disorder   . Renal insufficiency   . Stroke [facility])    August 2014, but has had strokes prior as well     Patient Active Problem List   Diagnosis Date Noted  . AKI (acute kidney injury) ([place]) 09/14/2018  . Acute on chronic heart failure ([place]) 09/06/2018  . Chronic diastolic heart failure ([place]) 12/15/2017  . Lymphedema 12/15/2017  . Pressure injury of  skin 06/08/2017  . Vomiting   . Gallstone   . Osteoarthritis of knee (Bilateral) (L>R) 12/26/2015  . Chronic pain 12/11/2015  . Long term current use of opiate analgesic 12/11/2015  . Encounter for therapeutic drug level monitoring 12/11/2015  . Chronic knee pain (Location of Primary Source of Pain) (Bilateral) (L>R) 12/11/2015  . Long term prescription opiate use 12/11/2015  . Opiate use (20 MME/Day) 12/11/2015  . Chronic hand pain (Location of Secondary source of pain) (Bilateral) (L>R) 12/11/2015  . Failed back surgical syndrome x 2 (Surgeon: Dr. Deri Fuelling) 12/11/2015  . Lumbar spondylosis 12/11/2015  . Chronic low back pain (Location of Tertiary source of pain) (Bilateral) (L>R) 12/11/2015  . Lumbar facet syndrome 12/11/2015  . Chronic lower extremity pain (Bilateral) (L>R) 12/11/2015  . Chronic neck pain (Left) 12/11/2015  . Cervical spondylosis 12/11/2015  . Chronic shoulder pain (Right) 12/11/2015  . Insulin dependent diabetes mellitus ([place]) 12/11/2015  . Abnormal MRI, lumbar spine (07/19/2013) 12/11/2015  . Lumbar postlaminectomy syndrome (L4-5) 12/11/2015  . Fusion of lumbar spine (L4-5 Ray cages) 12/11/2015  . Lumbar Levoscoliosis with apex at L4 12/11/2015  . Grade 1 Retrolisthesis of L2 over L3 and L3 over L4 12/11/2015  . Lumbar facet arthropathy 12/11/2015  . Lumbar foraminal stenosis (Bilateral L2-3, L3-4 & Left L5-S1) 12/11/2015  . Chronic anticoagulation (Plavix) 12/11/2015  . Breast cancer of  lower-outer quadrant of right female breast ([place]) 02/28/2014  . Lumbar stenosis with neurogenic claudication 07/24/2013  . Lumbar central spinal stenosis ( Severe at L3-4) (Mild R>L L2-3 & L5-S1) 07/20/2013  . Hyponatremia 07/20/2013  . Hypertension 07/20/2013  . Normocytic anemia 07/20/2013  . CKD (chronic kidney disease) stage 3, GFR 30-59 ml/min (HCC) 07/20/2013  .  Lumbar DDD (degenerative disc disease) 07/20/2013     Past Surgical History:  Procedure Laterality  Date  . ABDOMINAL HYSTERECTOMY     Patient not clear as to why  . BACK SURGERY    . BREAST BIOPSY Right February 15 2014   invasive mammary carcinoma/ER/PR negative, HER 2 positive  . BREAST BIOPSY Right 1999   negative biopsy  . BREAST SURGERY Right 03/06/14   mastectomy  . COLONOSCOPY WITH PROPOFOL N/A 04/15/2016   Procedure: COLONOSCOPY WITH PROPOFOL;  Surgeon: Manya Silvas, MD;  Location[facility] ENDOSCOPY;  Service: Endoscopy;  Laterality: N/A;  . ESOPHAGOGASTRODUODENOSCOPY (EGD) WITH PROPOFOL N/A 04/15/2016   Procedure: ESOPHAGOGASTRODUODENOSCOPY (EGD) WITH PROPOFOL;  Surgeon: Manya Silvas, MD;  Location: [facility] ENDOSCOPY;  Service: Endoscopy;  Laterality: N/A;  . HAND SURGERY Right    Carpel tunnel release in the 1970s  . LUMBAR LAMINECTOMY/DECOMPRESSION MICRODISCECTOMY Bilateral 07/24/2013   Procedure: LUMBAR LAMINECTOMY/DECOMPRESSION MICRODISCECTOMY LUMBAR THREE-FOUR;  Surgeon: Charlie Pitter, MD;  Location: [facility] NEURO ORS;  Service: Neurosurgery;  Laterality: Bilateral;  . MASTECTOMY Right 03/06/2014   right br ca. Dr Jamal Collin  . OOPHORECTOMY       Prior to Admission medications   Medication Sig Start Date End Date Taking? Authorizing Provider  benazepril (LOTENSIN) 40 MG tablet Take 1 tablet (40 mg total) by mouth daily. 12/12/17   Nicholes Mango, MD  clopidogrel (PLAVIX) 75 MG tablet Take 75 mg by mouth daily with breakfast.    [provider]  dicyclomine (BENTYL) 10 MG capsule Take 10 mg by mouth 4 (four) times daily as needed.  05/16/18   [provider]  gabapentin (NEURONTIN) 600 MG tablet Take 600 mg by mouth 4 (four) times daily.  11/25/16   [provider]  Glucosamine 500 MG CAPS Take 500 mg by mouth 3 (three) times daily.    [provider]  hydrALAZINE (APRESOLINE) 50 MG tablet Take 50 mg by mouth 3 (three) times daily.     [provider]  insulin aspart (NOVOLOG) 100 UNIT/ML injection Inject 4 Units into the skin 3 (three) times daily  before meals. Patient is only taking with biggest meal    [provider]  insulin detemir (LEVEMIR) 100 UNIT/ML injection Inject 18 Units into the skin at bedtime.    [provider]  magnesium oxide (MAG-OX) 400 MG tablet Take 400 mg by mouth 2 (two) times daily.    [provider]  metoprolol succinate (TOPROL-XL) 50 MG 24 hr tablet Take 1 tablet (50 mg total) by mouth daily. Take with or immediately following a meal. 09/16/18   Fritzi Mandes, MD  [facility] 5-325 MG tablet Take 1 tablet by mouth every 6 (six) hours as needed (back pain).  10/19/17   [provider]  Omega 3 1200 MG CAPS Take 1 capsule by mouth daily.     [provider]  ondansetron (ZOFRAN-ODT) 4 MG disintegrating tablet Take 4 mg by mouth every 8 (eight) hours as needed for nausea or vomiting.    [provider]  pantoprazole (PROTONIX) 40 MG tablet Take 40 mg by mouth daily.  [provider]  pravastatin (PRAVACHOL) 80 MG tablet Take 80 mg by mouth every evening.     [provider]  torsemide (DEMADEX) 20 MG tablet Take 40 mg by mouth daily.    [provider]     Allergies Patient has no known allergies.   Family History  Problem Relation Age of Onset  . CAD Father   . Cancer Sister 55       breast  . Breast cancer Sister 51  . Cancer Brother        kidney  . Cancer Maternal Aunt        breast  . Breast cancer Maternal Aunt 70  . Cancer Other        maternal niece with breast cancer  . Breast cancer Other   . Cancer Sister        pancreatic    Social History Social History   Tobacco Use  . Smoking status: Never Smoker  . Smokeless tobacco: Never Used  Substance Use Topics  . Alcohol use: No  . Drug use: No    Review of Systems Level 5 Caveat: Portions of the History and Physical including HPI and review of systems are unable to be completely obtained due to patient being a poor historian   Constitutional:   No known  fever.  ENT:   No rhinorrhea. Cardiovascular:   No chest pain or syncope. Respiratory:   No dyspnea or cough. Gastrointestinal:   Negative for abdominal pain, vomiting and diarrhea.  Musculoskeletal:   Negative for focal pain or swelling ____________________________________________   PHYSICAL EXAM:  VITAL SIGNS: ED Triage Vitals  Enc Vitals Group     BP 09/18/18 1453 (!) 172/59     Pulse Rate 09/18/18 1453 65     Resp 09/18/18 1453 18     Temp 09/18/18 1508 97.9 F (36.6 C)     Temp Source 09/18/18 1508 Oral     SpO2 09/18/18 1451 91 %     Weight 09/18/18 1453 165 lb (74.8 kg)     Height 09/18/18 1453 5\' 1"  (1.549 m)     Head Circumference --      Peak Flow --      Pain Score 09/18/18 1453 0     Pain Loc --      Pain Edu? --      Excl. in [place]? --     Vital signs reviewed, nursing assessments reviewed.   Constitutional:   Alert and oriented. Non-toxic appearance. Eyes:   Conjunctivae are normal. EOMI. PERRL. ENT      Head:   Normocephalic and atraumatic.      Nose:   No congestion/rhinnorhea.       Mouth/Throat:   MMM, no pharyngeal erythema. No peritonsillar mass.       Neck:   No meningismus. Full ROM. Hematological/Lymphatic/Immunilogical:   No cervical lymphadenopathy. Cardiovascular:   RRR. Symmetric bilateral radial and DP pulses.  No murmurs. Cap refill less than 2 seconds. Respiratory:   Normal respiratory effort without tachypnea/retractions. Breath sounds are clear and equal bilaterally. No wheezes/rales/rhonchi. Gastrointestinal:   Soft with mild right-sided abdominal tenderness.  Non distended. There is no CVA tenderness.  No rebound, rigidity, or guarding. Musculoskeletal:   Normal range of motion in all extremities. No joint effusions.  No lower extremity tenderness.  No edema. Neurologic:   Normal speech and language.  Cranial nerves III through XII intact No pronator drift, normal finger-to-nose Motor grossly intact. No  acute focal neurologic deficits  are appreciated.  Skin:    Skin is warm, dry and intact. No rash noted.  No petechiae, purpura, or bullae.  ____________________________________________    LABS (pertinent positives/negatives) (all labs ordered are listed, but only abnormal results are displayed) Labs Reviewed  COMPREHENSIVE METABOLIC PANEL - Abnormal; Notable for the following components:      Result Value   Sodium 134 (*)    Creatinine, Ser 1.23 (*)    Calcium 8.2 (*)    GFR calc non Af Amer 39 (*)    GFR calc Af Amer 46 (*)    All other components within normal limits  CBC WITH DIFFERENTIAL/PLATELET - Abnormal; Notable for the following components:   RBC 3.43 (*)    Hemoglobin 9.8 (*)    HCT 31.0 (*)    Abs Immature Granulocytes 0.08 (*)    All other components within normal limits  URINALYSIS, COMPLETE (UACMP) WITH MICROSCOPIC - Abnormal; Notable for the following components:   Color, Urine COLORLESS (*)    APPearance CLEAR (*)    Specific Gravity, Urine 1.004 (*)    Protein, ur 30 (*)    All other components within normal limits  URINE CULTURE  LIPASE, BLOOD  TROPONIN I   ____________________________________________   EKG Interpreted by me Sinus rhythm rate of 74, normal axis intervals QRS ST segments and T waves  ____________________________________________    RADIOLOGY  Mr Brain Wo Contrast  Result Date: 09/18/2018 CLINICAL DATA:  85 y/o  F; ataxia, stroke suspected. EXAM: MRI HEAD WITHOUT CONTRAST TECHNIQUE: Multiplanar, multiecho pulse sequences of the brain and surrounding structures were obtained without intravenous contrast. COMPARISON:  09/14/2018 CT head.  11/25/2016 MRI head. FINDINGS: Brain: No acute infarction, hemorrhage, hydrocephalus, extra-axial collection or mass lesion. 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 Stable punctate nonspecific T2 FLAIR hyperintensities in subcortical and periventricular white matter are compatible with mild chronic microvascular  ischemic changes. Stable volume loss of the brain. Vascular: Normal flow voids. Skull and upper cervical spine: Normal marrow signal. Sinuses/Orbits: Sphenoid and left maxillary sinus disease with a left maxillary sinus fluid level. No abnormal signal of mastoid air cells. Bilateral intra-ocular lens replacement. Other: None. IMPRESSION: 1. No acute intracranial abnormality identified. 2. Stable mild for age chronic microvascular ischemic changes and volume loss of the brain. Electronically Signed   By: Kristine Garbe M.D.   On: 09/18/2018 22:56   Ct Abdomen Pelvis W Contrast  Result Date: 09/18/2018 CLINICAL DATA:  Generalized abdominal pain EXAM: CT ABDOMEN AND PELVIS WITH CONTRAST TECHNIQUE: Multidetector CT imaging of the abdomen and pelvis was performed using the standard protocol following bolus administration of intravenous contrast. CONTRAST:  44mL OMNIPAQUE IOHEXOL 300 MG/ML  SOLN COMPARISON:  09/14/2018 FINDINGS: Lower chest: Calcified granuloma at the right lung base. Hepatobiliary: Liver is within normal limits, noting a calcified hepatic granuloma. Layering gallstones (series 2/image 34), without associated inflammatory changes. No intrahepatic or extrahepatic ductal dilatation. Pancreas: Within normal limits. Spleen: Calcified splenic granulomata. Adrenals/Urinary Tract: Adrenal glands are within normal limits. Right renal cysts, measuring up to 16 mm in the right upper pole. Left kidney is within normal limits. No hydronephrosis. Bladder is within normal limits. Stomach/Bowel: Stomach is notable for a tiny hiatal hernia. No evidence of bowel obstruction. Appendix is not discretely visualized and may be surgically absent. Left colonic diverticulosis, without evidence of diverticulitis. Vascular/Lymphatic: No evidence of abdominal aortic aneurysm. Atherosclerotic calcifications of the abdominal aorta and branch vessels.  No suspicious abdominopelvic lymphadenopathy. Reproductive: Status post  hysterectomy. Right ovary is within normal limits.  No left adnexal mass. Other: No abdominopelvic ascites. Small fat containing periumbilical hernia. Musculoskeletal: Degenerative changes of the visualized thoracolumbar spine. Ray cage fusion at L4-5. IMPRESSION: No CT findings to account for the patient's abdominal pain. No interval change from recent CT. Cholelithiasis, without associated inflammatory changes. Colonic diverticulosis, without evidence of diverticulitis. Additional stable ancillary findings as above. Electronically Signed   By: Julian Hy M.D.   On: 09/18/2018 18:00    ____________________________________________   PROCEDURES Procedures  ____________________________________________  DIFFERENTIAL DIAGNOSIS   Dehydration, subclinical viral illness/fatigue, colitis, diverticulitis, constipation, cystitis  CLINICAL IMPRESSION / ASSESSMENT AND PLAN / ED COURSE  Pertinent labs & imaging results that were available during my care of the patient were reviewed by me and considered in my medical decision making (see chart for details).    Patient presents with continued fatigue, right-sided abdominal pain.  Doubt AAA dissection or mesenteric ischemia.  Due to persistent symptoms, I will repeat the CT scan of the abdomen today, with IV contrast this time.  Will give IV fluids and check labs.  Clinical Course as of Sep 18 2325  Nancy Fetter Sep 18, 2018  1937 Patient seems back to baseline now, but family is insistent that something is seriously wrong with the patient.  Patient is calm.  I ambulated the patient myself in the room, she is mildly ataxic.  Unclear if this is chronic due to deconditioning and other comorbidities and the reason that she uses a rolling walker at home or acute.  I will obtain an MRI of the brain to further delineate.  If negative I think the patient can be discharged home to follow-up with her doctors, and I strongly consider them to pursue the physical therapy  that was offered while there at the hospital.  She does have an appointment with her cardiologist tomorrow and appointment primary care in 9 days.   [PS]    Clinical Course User Index [PS] Carrie Mew, MD     ----------------------------------------- 11:26 PM on 09/18/2018 -----------------------------------------  MRI brain negative, vital signs remain unremarkable.  Remains hypertensive's and should continue all her medications.  She had neurology consult and EEG while in the hospital.  No reason to rehospitalize at this point, recommend monitoring her symptoms at home, keeping her appoint with cardiology tomorrow, following up with primary care.  Return precautions discussed, questions answered.  ____________________________________________   FINAL CLINICAL IMPRESSION(S) / ED DIAGNOSES    Final diagnoses:  Transient alteration of awareness     ED Discharge Orders    None      Portions of this note were generated with dragon dictation software. Dictation errors may occur despite best attempts at proofreading.   Carrie Mew, MD 09/18/18 2329

## 2018-09-18 NOTE — ED Notes (Signed)
Fall band applied, pt shoes remain on

## 2018-09-18 NOTE — Discharge Instructions (Addendum)
Your test today were all okay.  This includes blood and urine tests, CT scan of the head, MRI of the brain.  Keep your appointment with your cardiologist tomorrow.  Follow-up with your primary care doctor this week.  Return to the ER if you have any new or worsening symptoms.  Results for orders placed or performed during the hospital encounter of 09/18/18  Comprehensive metabolic panel  Result Value Ref Range   Sodium 134 (L) 135 - 145 mmol/L   Potassium 4.1 3.5 - 5.1 mmol/L   Chloride 100 98 - 111 mmol/L   CO2 26 22 - 32 mmol/L   Glucose, Bld 81 70 - 99 mg/dL   BUN 17 8 - 23 mg/dL   Creatinine, Ser 1.23 (H) 0.44 - 1.00 mg/dL   Calcium 8.2 (L) 8.9 - 10.3 mg/dL   Total Protein 6.8 6.5 - 8.1 g/dL   Albumin 3.5 3.5 - 5.0 g/dL   AST 19 15 - 41 U/L   ALT 11 0 - 44 U/L   Alkaline Phosphatase 65 38 - 126 U/L   Total Bilirubin 0.5 0.3 - 1.2 mg/dL   GFR calc non Af Amer 39 (L) >60 mL/min   GFR calc Af Amer 46 (L) >60 mL/min   Anion gap 8 5 - 15  Lipase, blood  Result Value Ref Range   Lipase 22 11 - 51 U/L  Troponin I - Once  Result Value Ref Range   Troponin I <0.03 <0.03 ng/mL  CBC with Differential  Result Value Ref Range   WBC 8.0 4.0 - 10.5 K/uL   RBC 3.43 (L) 3.87 - 5.11 MIL/uL   Hemoglobin 9.8 (L) 12.0 - 15.0 g/dL   HCT 31.0 (L) 36.0 - 46.0 %   MCV 90.4 80.0 - 100.0 fL   MCH 28.6 26.0 - 34.0 pg   MCHC 31.6 30.0 - 36.0 g/dL   RDW 14.1 11.5 - 15.5 %   Platelets 291 150 - 400 K/uL   nRBC 0.0 0.0 - 0.2 %   Neutrophils Relative % 82 %   Neutro Abs 6.6 1.7 - 7.7 K/uL   Lymphocytes Relative 8 %   Lymphs Abs 0.7 0.7 - 4.0 K/uL   Monocytes Relative 7 %   Monocytes Absolute 0.5 0.1 - 1.0 K/uL   Eosinophils Relative 1 %   Eosinophils Absolute 0.1 0.0 - 0.5 K/uL   Basophils Relative 1 %   Basophils Absolute 0.0 0.0 - 0.1 K/uL   Immature Granulocytes 1 %   Abs Immature Granulocytes 0.08 (H) 0.00 - 0.07 K/uL  Urinalysis, Complete w Microscopic  Result Value Ref Range   Color,  Urine COLORLESS (A) YELLOW   APPearance CLEAR (A) CLEAR   Specific Gravity, Urine 1.004 (L) 1.005 - 1.030   pH 5.0 5.0 - 8.0   Glucose, UA NEGATIVE NEGATIVE mg/dL   Hgb urine dipstick NEGATIVE NEGATIVE   Bilirubin Urine NEGATIVE NEGATIVE   Ketones, ur NEGATIVE NEGATIVE mg/dL   Protein, ur 30 (A) NEGATIVE mg/dL   Nitrite NEGATIVE NEGATIVE   Leukocytes, UA NEGATIVE NEGATIVE   RBC / HPF 0-5 0 - 5 RBC/hpf   WBC, UA 0-5 0 - 5 WBC/hpf   Bacteria, UA NONE SEEN NONE SEEN   Squamous Epithelial / LPF 0-5 0 - 5   Mucus PRESENT    Hyaline Casts, UA PRESENT    Ct Abdomen Pelvis Wo Contrast  Result Date: 09/14/2018 CLINICAL DATA:  Syncopal episode on toilet. Diarrhea, nausea and vomiting. History  of breast cancer, hysterectomy and hemorrhoids. EXAM: CT ABDOMEN AND PELVIS WITHOUT CONTRAST TECHNIQUE: Multidetector CT imaging of the abdomen and pelvis was performed following the standard protocol without IV contrast. COMPARISON:  CT abdomen and pelvis November 23, 2016 FINDINGS: LOWER CHEST: Mucoid impaction RIGHT > LEFT lower lobe bronchi. Heart is at least mildly enlarged. Severe coronary artery calcifications. No pericardial effusion. Similar nodular thickening RIGHT hemidiaphragm. HEPATOBILIARY: Mild 1 cm gallstone with additional tiny layering gallbladder sludge versus gallstones. No CT findings of acute cholecystitis. Calcified hepatic granulomas. PANCREAS: Fatty atrophy. SPLEEN: Nonacute.  Calcified splenic granulomas. ADRENALS/URINARY TRACT: Kidneys are orthotopic, demonstrating normal size and morphology. No nephrolithiasis, hydronephrosis; limited assessment for renal masses by nonenhanced CT. 16 mm hypodense exophytic RIGHT upper pole cyst. 13 mm RIGHT lower pole cyst. The unopacified ureters are normal in course and caliber. Urinary bladder is well distended and unremarkable. Normal adrenal glands. STOMACH/BOWEL: The stomach, small and large bowel are normal in course and caliber without inflammatory  changes. Enteric contrast has not yet reached the distal small bowel. Moderate sigmoid colonic diverticulosis. Small hiatal hernia. The appendix is not discretely identified, however there are no inflammatory changes in the right lower quadrant. VASCULAR/LYMPHATIC: Aortoiliac vessels are normal in course and caliber. Moderate calcific atherosclerosis. No lymphadenopathy by CT size criteria. REPRODUCTIVE: Status post hysterectomy. OTHER: No intraperitoneal free fluid or free air. Moderate fat containing umbilical hernia. MUSCULOSKELETAL: Non-acute. Osteopenia. Severe lumbar spondylosis, status post L4-5 disc prosthesis with subsidence. IMPRESSION: 1. No acute intra-abdominal/pelvic process. 2. Colonic diverticulosis and cholelithiasis. Aortic Atherosclerosis (ICD10-I70.0). Electronically Signed   By: Elon Alas M.D.   On: 09/14/2018 18:58   Dg Chest 2 View  Result Date: 09/06/2018 CLINICAL DATA:  Shortness of breath for 2 days. EXAM: CHEST - 2 VIEW COMPARISON:  12/09/2017 and prior radiographs FINDINGS: Cardiomegaly noted. Mild pulmonary vascular congestion identified. There is no evidence of focal airspace disease, pulmonary edema, suspicious pulmonary nodule/mass, pleural effusion, or pneumothorax. No acute bony abnormalities are identified. IMPRESSION: Cardiomegaly with mild pulmonary vascular congestion. Electronically Signed   By: Margarette Canada M.D.   On: 09/06/2018 14:11   Ct Head Wo Contrast  Result Date: 09/14/2018 CLINICAL DATA:  Syncopal episode today. EXAM: CT HEAD WITHOUT CONTRAST CT CERVICAL SPINE WITHOUT CONTRAST TECHNIQUE: Multidetector CT imaging of the head and cervical spine was performed following the standard protocol without intravenous contrast. Multiplanar CT image reconstructions of the cervical spine were also generated. COMPARISON:  Head CT from 2014 and brain MRI 11/25/2016 FINDINGS: CT HEAD FINDINGS Brain: Stable age related cerebral atrophy, ventriculomegaly and  periventricular white matter disease. No extra-axial fluid collections are identified. No CT findings for acute hemispheric infarction or intracranial hemorrhage. No mass lesions. The brainstem and cerebellum are normal. Vascular: Stable vascular calcifications but no definite aneurysm or hyperdense vessels. Skull: No skull fracture or bone lesion. Sinuses/Orbits: Both half sphenoid sinus disease. The other paranasal sinuses are grossly clear. The mastoid air cells and middle ear cavities are clear. The globes are intact. Other: No scalp lesions or hematoma. CT CERVICAL SPINE FINDINGS Alignment: Advanced degenerative cervical spondylosis with multilevel disc disease and facet disease. Mild multilevel degenerative subluxations are noted. Skull base and vertebrae: The skull base C1 and C1-2 articulations are maintained. There are advanced degenerative changes at C1-2 with calcified pannus formation and mild mass effect on the upper cervical thecal sac. No acute fracture is identified. Soft tissues and spinal canal: No prevertebral fluid or swelling. No visible canal hematoma. Disc levels: Generous  spinal canal. There is multilevel bony foraminal narrowing due to uncinate spurring and facet disease. Upper chest: The lung apices are grossly clear. Other: Bilateral carotid artery calcifications are noted along with calcifications of the aortic branch vessels. Small thyroid nodules are noted. No neck mass or adenopathy. IMPRESSION: 1. No acute intracranial findings. No skull fracture. Stable age related cerebral atrophy, ventriculomegaly and periventricular white matter disease. 2. 3. Advanced degenerative cervical spondylosis with multilevel disc disease and facet disease but no acute fracture. Electronically Signed   By: Marijo Sanes M.D.   On: 09/14/2018 17:18   Ct Cervical Spine Wo Contrast  Result Date: 09/14/2018 CLINICAL DATA:  Syncopal episode today. EXAM: CT HEAD WITHOUT CONTRAST CT CERVICAL SPINE WITHOUT  CONTRAST TECHNIQUE: Multidetector CT imaging of the head and cervical spine was performed following the standard protocol without intravenous contrast. Multiplanar CT image reconstructions of the cervical spine were also generated. COMPARISON:  Head CT from 2014 and brain MRI 11/25/2016 FINDINGS: CT HEAD FINDINGS Brain: Stable age related cerebral atrophy, ventriculomegaly and periventricular white matter disease. No extra-axial fluid collections are identified. No CT findings for acute hemispheric infarction or intracranial hemorrhage. No mass lesions. The brainstem and cerebellum are normal. Vascular: Stable vascular calcifications but no definite aneurysm or hyperdense vessels. Skull: No skull fracture or bone lesion. Sinuses/Orbits: Both half sphenoid sinus disease. The other paranasal sinuses are grossly clear. The mastoid air cells and middle ear cavities are clear. The globes are intact. Other: No scalp lesions or hematoma. CT CERVICAL SPINE FINDINGS Alignment: Advanced degenerative cervical spondylosis with multilevel disc disease and facet disease. Mild multilevel degenerative subluxations are noted. Skull base and vertebrae: The skull base C1 and C1-2 articulations are maintained. There are advanced degenerative changes at C1-2 with calcified pannus formation and mild mass effect on the upper cervical thecal sac. No acute fracture is identified. Soft tissues and spinal canal: No prevertebral fluid or swelling. No visible canal hematoma. Disc levels: Generous spinal canal. There is multilevel bony foraminal narrowing due to uncinate spurring and facet disease. Upper chest: The lung apices are grossly clear. Other: Bilateral carotid artery calcifications are noted along with calcifications of the aortic branch vessels. Small thyroid nodules are noted. No neck mass or adenopathy. IMPRESSION: 1. No acute intracranial findings. No skull fracture. Stable age related cerebral atrophy, ventriculomegaly and  periventricular white matter disease. 2. 3. Advanced degenerative cervical spondylosis with multilevel disc disease and facet disease but no acute fracture. Electronically Signed   By: Marijo Sanes M.D.   On: 09/14/2018 17:18   Mr Brain Wo Contrast  Result Date: 09/18/2018 CLINICAL DATA:  85 y/o  F; ataxia, stroke suspected. EXAM: MRI HEAD WITHOUT CONTRAST TECHNIQUE: Multiplanar, multiecho pulse sequences of the brain and surrounding structures were obtained without intravenous contrast. COMPARISON:  09/14/2018 CT head.  11/25/2016 MRI head. FINDINGS: Brain: No acute infarction, hemorrhage, hydrocephalus, extra-axial collection or mass lesion. 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 Stable punctate nonspecific T2 FLAIR hyperintensities in subcortical and periventricular white matter are compatible with mild chronic microvascular ischemic changes. Stable volume loss of the brain. Vascular: Normal flow voids. Skull and upper cervical spine: Normal marrow signal. Sinuses/Orbits: Sphenoid and left maxillary sinus disease with a left maxillary sinus fluid level. No abnormal signal of mastoid air cells. Bilateral intra-ocular lens replacement. Other: None. IMPRESSION: 1. No acute intracranial abnormality identified. 2. Stable mild for age chronic microvascular ischemic changes and volume loss of the brain. Electronically Signed  By: Kristine Garbe M.D.   On: 09/18/2018 22:56   Ct Abdomen Pelvis W Contrast  Result Date: 09/18/2018 CLINICAL DATA:  Generalized abdominal pain EXAM: CT ABDOMEN AND PELVIS WITH CONTRAST TECHNIQUE: Multidetector CT imaging of the abdomen and pelvis was performed using the standard protocol following bolus administration of intravenous contrast. CONTRAST:  68mL OMNIPAQUE IOHEXOL 300 MG/ML  SOLN COMPARISON:  09/14/2018 FINDINGS: Lower chest: Calcified granuloma at the right lung base. Hepatobiliary: Liver is within normal limits, noting a calcified hepatic  granuloma. Layering gallstones (series 2/image 34), without associated inflammatory changes. No intrahepatic or extrahepatic ductal dilatation. Pancreas: Within normal limits. Spleen: Calcified splenic granulomata. Adrenals/Urinary Tract: Adrenal glands are within normal limits. Right renal cysts, measuring up to 16 mm in the right upper pole. Left kidney is within normal limits. No hydronephrosis. Bladder is within normal limits. Stomach/Bowel: Stomach is notable for a tiny hiatal hernia. No evidence of bowel obstruction. Appendix is not discretely visualized and may be surgically absent. Left colonic diverticulosis, without evidence of diverticulitis. Vascular/Lymphatic: No evidence of abdominal aortic aneurysm. Atherosclerotic calcifications of the abdominal aorta and branch vessels. No suspicious abdominopelvic lymphadenopathy. Reproductive: Status post hysterectomy. Right ovary is within normal limits.  No left adnexal mass. Other: No abdominopelvic ascites. Small fat containing periumbilical hernia. Musculoskeletal: Degenerative changes of the visualized thoracolumbar spine. Ray cage fusion at L4-5. IMPRESSION: No CT findings to account for the patient's abdominal pain. No interval change from recent CT. Cholelithiasis, without associated inflammatory changes. Colonic diverticulosis, without evidence of diverticulitis. Additional stable ancillary findings as above. Electronically Signed   By: Julian Hy M.D.   On: 09/18/2018 18:00   Dg Chest Portable 1 View  Result Date: 09/14/2018 CLINICAL DATA:  Syncopal episode. EXAM: PORTABLE CHEST 1 VIEW COMPARISON:  09/06/2018 FINDINGS: The the cardiac silhouette, mediastinal and hilar contours are within normal limits and stable given the AP projection and portable technique. Stable mild tortuosity and calcification of the thoracic aorta. The lungs are clear. No pleural effusion. No worrisome pulmonary lesions. Stable calcified granuloma noted in the left  upper lobe. IMPRESSION: No acute cardiopulmonary findings. Electronically Signed   By: Marijo Sanes M.D.   On: 09/14/2018 17:06

## 2018-09-18 NOTE — ED Triage Notes (Signed)
Pt to Ed via EMS from home. Pt c/o "not feeling right". Doesn't say she is confused or dizzy but doesn't feel right in the head. Pt is alerted and oriented in no acute distress at this time. Pt takes percocet at home, unsure if extra percocet was taken

## 2018-09-18 NOTE — ED Notes (Signed)
Pt transported to MRI with ED tech at this time

## 2018-09-18 NOTE — ED Notes (Signed)
Pt cleared by MD to eat and drink at this time. Pt given [place] tray sandwich and water to drink at this time.

## 2018-09-19 ENCOUNTER — Ambulatory Visit: Payer: Medicare HMO | Admitting: Family

## 2018-09-20 LAB — URINE CULTURE: Culture: NO GROWTH

## 2018-09-21 ENCOUNTER — Ambulatory Visit: Payer: Medicare HMO | Attending: Family | Admitting: Family

## 2018-09-21 ENCOUNTER — Encounter: Payer: Self-pay | Admitting: Family

## 2018-09-21 VITALS — BP 149/57 | HR 93 | Resp 18 | Ht 61.0 in | Wt 172.4 lb

## 2018-09-21 DIAGNOSIS — D649 Anemia, unspecified: Secondary | ICD-10-CM | POA: Insufficient documentation

## 2018-09-21 DIAGNOSIS — I130 Hypertensive heart and chronic kidney disease with heart failure and stage 1 through stage 4 chronic kidney disease, or unspecified chronic kidney disease: Secondary | ICD-10-CM | POA: Insufficient documentation

## 2018-09-21 DIAGNOSIS — I890 Lymphedema, not elsewhere classified: Secondary | ICD-10-CM

## 2018-09-21 DIAGNOSIS — Z8673 Personal history of transient ischemic attack (TIA), and cerebral infarction without residual deficits: Secondary | ICD-10-CM | POA: Diagnosis not present

## 2018-09-21 DIAGNOSIS — Z86718 Personal history of other venous thrombosis and embolism: Secondary | ICD-10-CM | POA: Diagnosis not present

## 2018-09-21 DIAGNOSIS — M549 Dorsalgia, unspecified: Secondary | ICD-10-CM | POA: Diagnosis not present

## 2018-09-21 DIAGNOSIS — G8929 Other chronic pain: Secondary | ICD-10-CM | POA: Insufficient documentation

## 2018-09-21 DIAGNOSIS — M1990 Unspecified osteoarthritis, unspecified site: Secondary | ICD-10-CM | POA: Insufficient documentation

## 2018-09-21 DIAGNOSIS — R0602 Shortness of breath: Secondary | ICD-10-CM | POA: Insufficient documentation

## 2018-09-21 DIAGNOSIS — Z8249 Family history of ischemic heart disease and other diseases of the circulatory system: Secondary | ICD-10-CM | POA: Diagnosis not present

## 2018-09-21 DIAGNOSIS — Z794 Long term (current) use of insulin: Secondary | ICD-10-CM | POA: Insufficient documentation

## 2018-09-21 DIAGNOSIS — Z79899 Other long term (current) drug therapy: Secondary | ICD-10-CM | POA: Diagnosis not present

## 2018-09-21 DIAGNOSIS — N189 Chronic kidney disease, unspecified: Secondary | ICD-10-CM | POA: Insufficient documentation

## 2018-09-21 DIAGNOSIS — E119 Type 2 diabetes mellitus without complications: Secondary | ICD-10-CM

## 2018-09-21 DIAGNOSIS — E1122 Type 2 diabetes mellitus with diabetic chronic kidney disease: Secondary | ICD-10-CM | POA: Diagnosis not present

## 2018-09-21 DIAGNOSIS — I5032 Chronic diastolic (congestive) heart failure: Secondary | ICD-10-CM | POA: Insufficient documentation

## 2018-09-21 DIAGNOSIS — G479 Sleep disorder, unspecified: Secondary | ICD-10-CM | POA: Diagnosis not present

## 2018-09-21 DIAGNOSIS — IMO0001 Reserved for inherently not codable concepts without codable children: Secondary | ICD-10-CM

## 2018-09-21 DIAGNOSIS — Z7901 Long term (current) use of anticoagulants: Secondary | ICD-10-CM | POA: Diagnosis not present

## 2018-09-21 DIAGNOSIS — I10 Essential (primary) hypertension: Secondary | ICD-10-CM

## 2018-09-21 DIAGNOSIS — E860 Dehydration: Secondary | ICD-10-CM | POA: Insufficient documentation

## 2018-09-21 NOTE — Patient Instructions (Signed)
Continue weighing daily and call for an overnight weight gain of > 2 pounds or a weekly weight gain of >5 pounds. 

## 2018-09-21 NOTE — Progress Notes (Signed)
Patient ID: Kristen Barron, female    DOB: [birth year], 85 y.o.   MRN: [id number]  HPI  Kristen Barron is a 85 y/o female with a history of breast cancer, DM, HTN, CKD, stroke, DVT and chronic heart failure.   Echo report from 12/10/17 reviewed and showed an EF of 50-55%.  Was in the ED 09/18/2018 due to altered mental status. Brain MRI was done and was negative and she was released. Admitted 09/14/2018 due to acute kidney injury due to dehydration due to to diarrhea. Given gentle hydration. Cdiff was negative and she was discharged after 2 days. Was in the ED 09/06/2018 due to UTI where she was treated and released.   She presents today for a follow-up visit with a chief complaint of moderate shortness of breath upon minimal exertion. She describes this as chronic in nature having been present for several years. She has associated fatigue, pedal edema, chronic back pain and difficulty sleeping along with this. She denies any dizziness, abdominal distention, palpitations or chest pain. Had her first B12 injection yesterday for anemia.   Past Medical History:  Diagnosis Date  . Arthritis   . Breast cancer ([place]) 03/06/2014   right breast with mastectomy and chemo  . Cancer [facility])    Reports lumpectomy for a cyst  . CHF (congestive heart failure) ([place])   . Collagen vascular disease ([place])   . Diabetes ([place])   . DVT (deep venous thrombosis) ([place])   . Hemorrhoids   . Hypertension   . Renal disorder   . Renal insufficiency   . Stroke [facility])    August 2014, but has had strokes prior as well   Past Surgical History:  Procedure Laterality Date  . ABDOMINAL HYSTERECTOMY     Patient not clear as to why  . BACK SURGERY    . BREAST BIOPSY Right February 15 2014   invasive mammary carcinoma/ER/PR negative, HER 2 positive  . BREAST BIOPSY Right 1999   negative biopsy  . BREAST SURGERY Right 03/06/14   mastectomy  . COLONOSCOPY WITH PROPOFOL N/A 04/15/2016   Procedure: COLONOSCOPY WITH PROPOFOL;  Surgeon: Manya Silvas, MD;  Location: [facility] ENDOSCOPY;  Service: Endoscopy;  Laterality: N/A;  . ESOPHAGOGASTRODUODENOSCOPY (EGD) WITH PROPOFOL N/A 04/15/2016   Procedure: ESOPHAGOGASTRODUODENOSCOPY (EGD) WITH PROPOFOL;  Surgeon: Manya Silvas, MD;  Location: [facility] ENDOSCOPY;  Service: Endoscopy;  Laterality: N/A;  . HAND SURGERY Right    Carpel tunnel release in the 1970s  . LUMBAR LAMINECTOMY/DECOMPRESSION MICRODISCECTOMY Bilateral 07/24/2013   Procedure: LUMBAR LAMINECTOMY/DECOMPRESSION MICRODISCECTOMY LUMBAR THREE-FOUR;  Surgeon: Charlie Pitter, MD;  Location: [facility] NEURO ORS;  Service: Neurosurgery;  Laterality: Bilateral;  . MASTECTOMY Right 03/06/2014   right br ca. Dr Jamal Collin  . OOPHORECTOMY     Family History  Problem Relation Age of Onset  . CAD Father   . Cancer Sister 79       breast  . Breast cancer Sister 33  . Cancer Brother        kidney  . Cancer Maternal Aunt        breast  . Breast cancer Maternal Aunt 70  . Cancer Other        maternal niece with breast cancer  . Breast cancer Other   . Cancer Sister        pancreatic   Social History   Tobacco Use  . Smoking status: Never Smoker  . Smokeless tobacco: Never Used  Substance Use Topics  .  Alcohol use: No   No Known Allergies  Prior to Admission medications   Medication Sig Start Date End Date Taking? Authorizing Provider  benazepril (LOTENSIN) 40 MG tablet Take 1 tablet (40 mg total) by mouth daily. 12/12/17  Yes Gouru, Illene Silver, MD  clopidogrel (PLAVIX) 75 MG tablet Take 75 mg by mouth daily with breakfast.   Yes [provider]  dicyclomine (BENTYL) 10 MG capsule Take 10 mg by mouth 4 (four) times daily as needed.  05/16/18  Yes [provider]  gabapentin (NEURONTIN) 600 MG tablet Take 600 mg by mouth 4 (four) times daily.  11/25/16  Yes [provider]  Glucosamine 500 MG CAPS Take 500 mg by mouth 3 (three) times daily.   Yes [provider]  hydrALAZINE (APRESOLINE) 50 MG tablet Take 50 mg  by mouth 3 (three) times daily.    Yes [provider]  insulin aspart (NOVOLOG) 100 UNIT/ML injection Inject 4 Units into the skin 3 (three) times daily before meals. Patient is only taking with biggest meal   Yes [provider]  insulin detemir (LEVEMIR) 100 UNIT/ML injection Inject 18 Units into the skin at bedtime.   Yes [provider]  magnesium oxide (MAG-OX) 400 MG tablet Take 400 mg by mouth 2 (two) times daily.   Yes [provider]  metoprolol succinate (TOPROL-XL) 50 MG 24 hr tablet Take 1 tablet (50 mg total) by mouth daily. Take with or immediately following a meal. Patient taking differently: Take 25 mg by mouth daily. Take with or immediately following a meal. 09/16/18  Yes Fritzi Mandes, MD  [facility] 5-325 MG tablet Take 1 tablet by mouth every 6 (six) hours as needed (back pain).  10/19/17  Yes [provider]  Omega 3 1200 MG CAPS Take 1 capsule by mouth daily.    Yes [provider]  ondansetron (ZOFRAN-ODT) 4 MG disintegrating tablet Take 4 mg by mouth every 8 (eight) hours as needed for nausea or vomiting.   Yes [provider]  pantoprazole (PROTONIX) 40 MG tablet Take 40 mg by mouth daily.   Yes [provider]  pravastatin (PRAVACHOL) 80 MG tablet Take 80 mg by mouth every evening.    Yes [provider]  torsemide (DEMADEX) 20 MG tablet Take 40 mg by mouth daily.   Yes [provider]     Review of Systems  Constitutional: Positive for fatigue. Negative for appetite change.  HENT: Positive for hearing loss. Negative for congestion, postnasal drip and sore throat.   Eyes: Negative.   Respiratory: Positive for shortness of breath (with minimal exertion). Negative for chest tightness.   Cardiovascular: Positive for leg swelling. Negative for chest pain and palpitations.  Gastrointestinal: Negative for abdominal distention and abdominal pain.  Endocrine: Negative.   Genitourinary: Negative.   Negative for hematuria.  Musculoskeletal: Positive for arthralgias (legs) and back pain (right lower back).  Skin: Negative.   Allergic/Immunologic: Negative.   Neurological: Negative for dizziness and light-headedness.  Hematological: Negative for adenopathy. Bruises/bleeds easily.  Psychiatric/Behavioral: Positive for sleep disturbance (waking up frequently). Negative for dysphoric mood. The patient is not nervous/anxious.    Vitals:   09/21/18 1521  BP: (!) 149/57  Pulse: 93  Resp: 18  SpO2: 97%  Weight: 172 lb 6 oz (78.2 kg)  Height: 5\' 1"  (1.549 m)   Wt Readings from Last 3 Encounters:  09/21/18 172 lb 6 oz (78.2 kg)  09/18/18 165 lb (74.8 kg)  09/15/18 177 lb  14.6 oz (80.7 kg)   Lab Results  Component Value Date   CREATININE 1.23 (H) 09/18/2018   CREATININE 1.19 (H) 09/15/2018   CREATININE 1.49 (H) 09/14/2018    Physical Exam Vitals signs and nursing note reviewed.  Constitutional:      Appearance: She is well-developed.  HENT:     Head: Normocephalic and atraumatic.  Neck:     Musculoskeletal: Normal range of motion and neck supple.     Vascular: No JVD.  Cardiovascular:     Rate and Rhythm: Normal rate and regular rhythm.  Pulmonary:     Effort: Pulmonary effort is normal. No respiratory distress.     Breath sounds: No wheezing or rales.  Abdominal:     General: There is no distension.     Palpations: Abdomen is soft.  Musculoskeletal:     Right lower leg: Edema (trace pitting edema) present.     Left lower leg: Edema (trace pitting edema) present.  Skin:    General: Skin is warm and dry.  Neurological:     Mental Status: She is alert and oriented to person, place, and time.  Psychiatric:        Behavior: Behavior normal.    Assessment & Plan:  1: Chronic heart failure with preserved ejection fraction- - NYHA class III - euvolemic today - not weighing daily and she was encouraged to resume weighing daily and to call for an overnight weight gain  of >2 pounds or a weekly weight gain of >5 pounds - weight down 8 pounds from last visit here 2 weeks ago - not adding salt but says that she hasn't been reading food labels much. Reviewed the importance of reading food labels so that she can closely follow a 2000mg  sodium diet.  - BNP 09/14/2018 was 159.0 - has received flu vaccine for this season - feels like she's been taking her medications correctly but admits that she's not 100% sure; discussed using a pillbox as daughter says that she's been resistant to this idea before. Reviewed the importance of taking the medications correctly and that using a pillbox would decrease the chances of error. Patient says that she will think about it. - discussed paramedicine program but patient says that she would like to think about it  2: HTN- - BP looks good today - saw PCP Posey Pronto) ~ 10 days ago - BMP 09/18/2018 reviewed and showed sodium 134, potassium 4.1 , creatinine 1.23 and GFR 39  3: Diabetes-  - glucose at home yesterday was 151 - A1c 12/09/17 was 5.7%  4: Lymphedema- - stage 2 - limited in exercise due to instability and use of walker - encouraged her to elevate her legs when sitting for long periods of time - encouraged her to get compression socks and put them on every morning with removal at bedtime  Patient did not bring her medications nor a list. Each medication was verbally reviewed with the patient and she was encouraged to bring the bottles to every visit to confirm accuracy of list.  Return in 3 months or sooner for any questions/problems before then.

## 2018-12-26 ENCOUNTER — Other Ambulatory Visit: Payer: Self-pay

## 2018-12-26 ENCOUNTER — Telehealth: Payer: Self-pay

## 2018-12-26 ENCOUNTER — Ambulatory Visit: Payer: Medicare HMO | Attending: Family | Admitting: Family

## 2018-12-26 ENCOUNTER — Encounter: Payer: Self-pay | Admitting: Family

## 2018-12-26 VITALS — Wt 171.0 lb

## 2018-12-26 DIAGNOSIS — I890 Lymphedema, not elsewhere classified: Secondary | ICD-10-CM

## 2018-12-26 DIAGNOSIS — I10 Essential (primary) hypertension: Secondary | ICD-10-CM

## 2018-12-26 DIAGNOSIS — IMO0001 Reserved for inherently not codable concepts without codable children: Secondary | ICD-10-CM

## 2018-12-26 DIAGNOSIS — I5032 Chronic diastolic (congestive) heart failure: Secondary | ICD-10-CM

## 2018-12-26 NOTE — Patient Instructions (Signed)
Continue weighing daily and call for an overnight weight gain of > 2 pounds or a weekly weight gain of >5 pounds. 

## 2018-12-26 NOTE — Telephone Encounter (Signed)
TELEPHONE CALL NOTE  Kristen Barron has been deemed a candidate for a follow-up tele-health visit to limit community exposure during the Covid-19 pandemic. I spoke with the patient via phone to ensure availability of phone/video source, confirm preferred email & phone number, discuss instructions and expectations, and review consent.   I reminded Kristen Barron to be prepared with any vital sign and/or heart rhythm information that could potentially be obtained via home monitoring, at the time of her visit.  Finally, I reminded Kristen Barron to expect an e-mail containing a link for their video-based visit approximately 15 minutes before her visit, or alternatively, a phone call at the time of her visit if her visit is planned to be a phone encounter.  Did the patient verbally consent to treatment as below? YES  Nathin Saran L, CMA 12/26/2018 11:04 AM  CONSENT FOR TELE-HEALTH VISIT - PLEASE REVIEW  I hereby voluntarily request, consent and authorize [facility] and its employed or contracted physicians, physician assistants, nurse practitioners or other licensed health care professionals (the Practitioner), to provide me with telemedicine health care services (the "Services") as deemed necessary by the treating Practitioner. I acknowledge and consent to receive the Services by the Practitioner via telemedicine. I understand that the telemedicine visit will involve communicating with the Practitioner through telephonic communication technology and the disclosure of certain medical information by electronic transmission. I acknowledge that I have been given the opportunity to request an in-person assessment or other available alternative prior to the telemedicine visit and am voluntarily participating in the telemedicine visit.  I understand that I have the right to withhold or withdraw my consent to the use of telemedicine in the course of my care at any time, without affecting my right  to future care or treatment, and that the Practitioner or I may terminate the telemedicine visit at any time. I understand that I have the right to inspect all information obtained and/or recorded in the course of the telemedicine visit and may receive copies of available information for a reasonable fee.  I understand that some of the potential risks of receiving the Services via telemedicine include:  Marland Kitchen Delay or interruption in medical evaluation due to technological equipment failure or disruption; . Information transmitted may not be sufficient (e.g. poor resolution of images) to allow for appropriate medical decision making by the Practitioner; and/or  . In rare instances, security protocols could fail, causing a breach of personal health information.  Furthermore, I acknowledge that it is my responsibility to provide information about my medical history, conditions and care that is complete and accurate to the best of my ability. I acknowledge that Practitioner's advice, recommendations, and/or decision may be based on factors not within their control, such as incomplete or inaccurate data provided by me or lack of visual representation. I understand that the practice of medicine is not an exact science and that Practitioner makes no warranties or guarantees regarding treatment outcomes. I acknowledge that I will receive a copy of this consent concurrently upon execution via email to the email address I last provided but may also request a printed copy by calling the office of [facility].    I understand that my insurance may be billed for this visit.   I have read or had this consent read to me. . I understand the contents of this consent, which adequately explains the benefits and risks of the Services being provided via telemedicine.  Marland Kitchen  I have been provided ample opportunity to ask questions regarding this consent and the Services and have had my questions answered to my  satisfaction. . I give my informed consent for the services to be provided through the use of telemedicine in my medical care  By participating in this telemedicine visit I agree to the above.

## 2018-12-26 NOTE — Progress Notes (Signed)
Virtual Visit via Telephone Note    Evaluation Performed:  Follow-up visit  This visit type was conducted due to national recommendations for restrictions regarding the COVID-19 Pandemic (e.g. social distancing).  This format is felt to be most appropriate for this patient at this time.  All issues noted in this document were discussed and addressed.  No physical exam was performed (except for noted visual exam findings with Video Visits).  Please refer to the patient's chart (MyChart message for video visits and phone note for telephone visits) for the patient's consent to telehealth for [facility]  Date:  12/26/2018   ID:  Kristen Barron, DOB 05/17/1929, MRN [id number]  Patient Location:  [address] [place] [address]   Provider location:   [facility] [address], [place] [address]  PCP:  Kristen Lank, MD  Cardiologist:  No primary care provider on file.  Electrophysiologist:  None   Chief Complaint:  Shortness of breath  History of Present Illness:    Kristen Barron is a 85 y.o. female who presents via audio/video conferencing for a telehealth visit today.  Patient verified DOB and address.  The patient does not have symptoms concerning for COVID-19 infection (fever, chills, cough, or new SHORTNESS OF BREATH).   Patient reports moderate shortness of breath upon minimal exertion. She describes this as chronic in nature having been present for several years. She has associated dizziness, fatigue and burning pain sensation in her legs. She denies any swelling in her legs/ abdomen, palpitations, chest pain or weight gain.   Prior CV studies:   The following studies were reviewed today:  Echo report from 12/10/17 reviewed and showed an EF of 50-55%  Past Medical History:  Diagnosis Date  . Arthritis   . Breast cancer ([place]) 03/06/2014   right breast with mastectomy and chemo  . Cancer [facility])    Reports lumpectomy for a cyst   . CHF (congestive heart failure) ([place])   . Collagen vascular disease ([place])   . Diabetes ([place])   . DVT (deep venous thrombosis) ([place])   . Hemorrhoids   . Hypertension   . Renal disorder   . Renal insufficiency   . Stroke [facility])    August 2014, but has had strokes prior as well   Past Surgical History:  Procedure Laterality Date  . ABDOMINAL HYSTERECTOMY     Patient not clear as to why  . BACK SURGERY    . BREAST BIOPSY Right February 15 2014   invasive mammary carcinoma/ER/PR negative, HER 2 positive  . BREAST BIOPSY Right 1999   negative biopsy  . BREAST SURGERY Right 03/06/14   mastectomy  . COLONOSCOPY WITH PROPOFOL N/A 04/15/2016   Procedure: COLONOSCOPY WITH PROPOFOL;  Surgeon: Kristen Silvas, MD;  Location[facility] ENDOSCOPY;  Service: Endoscopy;  Laterality: N/A;  . ESOPHAGOGASTRODUODENOSCOPY (EGD) WITH PROPOFOL N/A 04/15/2016   Procedure: ESOPHAGOGASTRODUODENOSCOPY (EGD) WITH PROPOFOL;  Surgeon: Kristen Silvas, MD;  Location: [facility] ENDOSCOPY;  Service: Endoscopy;  Laterality: N/A;  . HAND SURGERY Right    Carpel tunnel release in the 1970s  . LUMBAR LAMINECTOMY/DECOMPRESSION MICRODISCECTOMY Bilateral 07/24/2013   Procedure: LUMBAR LAMINECTOMY/DECOMPRESSION MICRODISCECTOMY LUMBAR THREE-FOUR;  Surgeon: Charlie Pitter, MD;  Location: [place] [facility] NEURO ORS;  Service: Neurosurgery;  Laterality: Bilateral;  . MASTECTOMY Right 03/06/2014   right br ca. Dr Kristen Barron  . OOPHORECTOMY       Current Meds  Medication Sig  . benazepril (LOTENSIN)  40 MG tablet Take 1 tablet (40 mg total) by mouth daily.  . clopidogrel (PLAVIX) 75 MG tablet Take 75 mg by mouth daily with breakfast.  . dicyclomine (BENTYL) 10 MG capsule Take 10 mg by mouth 4 (four) times daily as needed.   . gabapentin (NEURONTIN) 600 MG tablet Take 600 mg by mouth daily.   . Glucosamine 500 MG CAPS Take 500 mg by mouth 3 (three) times daily.  . hydrALAZINE (APRESOLINE) 50 MG tablet Take 50 mg by mouth daily.   . insulin aspart (NOVOLOG)  100 UNIT/ML injection Inject 4 Units into the skin 3 (three) times daily before meals. Patient is only taking with biggest meal  . insulin detemir (LEVEMIR) 100 UNIT/ML injection Inject 18 Units into the skin at bedtime.  . magnesium oxide (MAG-OX) 400 MG tablet Take 400 mg by mouth 2 (two) times daily.  . metolazone (ZAROXOLYN) 5 MG tablet Take 5 mg by mouth daily.  . metoprolol succinate (TOPROL-XL) 50 MG 24 hr tablet Take 1 tablet (50 mg total) by mouth daily. Take with or immediately following a meal.  . NORCO 5-325 MG tablet Take 1 tablet by mouth every 6 (six) hours as needed (back pain).   . Omega 3 1200 MG CAPS Take 1 capsule by mouth 3 (three) times daily.   . ondansetron (ZOFRAN-ODT) 4 MG disintegrating tablet Take 4 mg by mouth every 8 (eight) hours as needed for nausea or vomiting.  Marland Kitchen oxybutynin (DITROPAN-XL) 5 MG 24 hr tablet Take 5 mg by mouth at bedtime.  . pantoprazole (PROTONIX) 40 MG tablet Take 40 mg by mouth daily.  . pravastatin (PRAVACHOL) 80 MG tablet Take 80 mg by mouth every evening.   . torsemide (DEMADEX) 20 MG tablet Take 40 mg by mouth daily.  . traZODone (DESYREL) 50 MG tablet Take 50 mg by mouth at bedtime.     Allergies:   Patient has no known allergies.   Social History   Tobacco Use  . Smoking status: Never Smoker  . Smokeless tobacco: Never Used  Substance Use Topics  . Alcohol use: No  . Drug use: No     Family Hx: The patient's family history includes Breast cancer in an other family member; Breast cancer (age of onset: 36) in her sister; Breast cancer (age of onset: 6) in her maternal aunt; CAD in her father; Cancer in her brother, maternal aunt, sister, and another family member; Cancer (age of onset: 8) in her sister.  ROS:   Please see the history of present illness.     All other systems reviewed and are negative.   Labs/Other Tests and Data Reviewed:    Recent Labs: 09/14/2018: B Natriuretic Peptide 159.0 09/18/2018: ALT 11; BUN 17;  Creatinine, Ser 1.23; Hemoglobin 9.8; Platelets 291; Potassium 4.1; Sodium 134   Recent Lipid Panel No results found for: CHOL, TRIG, HDL, CHOLHDL, LDLCALC, LDLDIRECT  Wt Readings from Last 3 Encounters:  12/26/18 171 lb (77.6 kg)  09/21/18 172 lb 6 oz (78.2 kg)  09/18/18 165 lb (74.8 kg)     Exam:    Vital Signs:  Wt 171 lb (77.6 kg) Comment: self-reported  BMI 32.31 kg/m    Well nourished, well developed female in no  acute distress.   ASSESSMENT & PLAN:    1. Chronic heart failure with preserved ejection fraction- - NYHA class III - euvolemic today based on patient's description of symptoms - weighing daily and she said that her weight has been stable;  reminded to call for an overnight weight gain of >2 pounds or a weekly weight gain of >5 pounds - not adding salt and is trying to closely follow a low sodium diet  - BNP 09/14/2018 was 159.0  2: HTN- - not checking her BP at home - saw PCP Posey Pronto) a few months ago - BMP 09/18/2018 reviewed and showed sodium 134, potassium 4.1 , creatinine 1.23 and GFR 39  3: Diabetes-  - glucose at home today was 125 - A1c 12/09/17 was 5.7%  4: Lymphedema- - stage 2 - limited in exercise due to instability and use of walker - encouraged her to elevate her legs when sitting for long periods of time - patient says that her swelling is now gone  COVID-19 Education: The signs and symptoms of COVID-19 were discussed with the patient and how to seek care for testing (follow up with PCP or arrange E-visit).  The importance of social distancing was discussed today.  Patient Risk:   After full review of this patients clinical status, I feel that they are at least moderate risk at this time.  Time:   Today, I have spent 14 minutes with the patient with telehealth technology discussing medications, weight and symptoms to report.      Medication Adjustments/Labs and Tests Ordered: Current medicines are reviewed at length with the patient  today.  Concerns regarding medicines are outlined above.   Tests Ordered: No orders of the defined types were placed in this encounter.  Medication Changes: No orders of the defined types were placed in this encounter.   Disposition:  Follow-up in 4 months or sooner for any questions/problems before then.   Signed, Alisa Graff, FNP  12/26/2018 2:32 PM    [facility]

## 2018-12-26 NOTE — Telephone Encounter (Signed)
   TELEPHONE CALL NOTE  This patient has been deemed a candidate for follow-up tele-health visit to limit community exposure during the Covid-19 pandemic. I spoke with the patient via phone to discuss instructions. The patient was advised to review the section on consent for treatment as well. The patient will receive a phone call 2-3 days prior to their E-Visit at which time consent will be verbally confirmed. A Virtual Office Visit appointment type has been scheduled for 12/26/2018 with [facility].  Olivya Sobol L, [place] 12/26/2018 11:03 AM

## 2019-02-09 ENCOUNTER — Other Ambulatory Visit: Payer: Self-pay

## 2019-02-09 ENCOUNTER — Emergency Department: Payer: Medicare HMO

## 2019-02-09 ENCOUNTER — Emergency Department
Admission: EM | Admit: 2019-02-09 | Discharge: 2019-02-09 | Disposition: A | Discharge status: Home / Self Care | Payer: Medicare HMO | Attending: Emergency Medicine | Admitting: Emergency Medicine

## 2019-02-09 ENCOUNTER — Encounter: Payer: Self-pay | Admitting: Emergency Medicine

## 2019-02-09 DIAGNOSIS — Z853 Personal history of malignant neoplasm of breast: Secondary | ICD-10-CM | POA: Insufficient documentation

## 2019-02-09 DIAGNOSIS — R109 Unspecified abdominal pain: Secondary | ICD-10-CM | POA: Diagnosis not present

## 2019-02-09 DIAGNOSIS — S0083XA Contusion of other part of head, initial encounter: Secondary | ICD-10-CM | POA: Diagnosis not present

## 2019-02-09 DIAGNOSIS — G8929 Other chronic pain: Secondary | ICD-10-CM | POA: Diagnosis not present

## 2019-02-09 DIAGNOSIS — W1811XA Fall from or off toilet without subsequent striking against object, initial encounter: Secondary | ICD-10-CM | POA: Insufficient documentation

## 2019-02-09 DIAGNOSIS — R197 Diarrhea, unspecified: Secondary | ICD-10-CM | POA: Insufficient documentation

## 2019-02-09 DIAGNOSIS — M545 Low back pain, unspecified: Secondary | ICD-10-CM

## 2019-02-09 DIAGNOSIS — S0990XA Unspecified injury of head, initial encounter: Secondary | ICD-10-CM | POA: Diagnosis present

## 2019-02-09 DIAGNOSIS — N390 Urinary tract infection, site not specified: Secondary | ICD-10-CM

## 2019-02-09 DIAGNOSIS — I110 Hypertensive heart disease with heart failure: Secondary | ICD-10-CM | POA: Diagnosis not present

## 2019-02-09 DIAGNOSIS — Y939 Activity, unspecified: Secondary | ICD-10-CM | POA: Diagnosis not present

## 2019-02-09 DIAGNOSIS — Z794 Long term (current) use of insulin: Secondary | ICD-10-CM | POA: Insufficient documentation

## 2019-02-09 DIAGNOSIS — Y929 Unspecified place or not applicable: Secondary | ICD-10-CM | POA: Diagnosis not present

## 2019-02-09 DIAGNOSIS — M25511 Pain in right shoulder: Secondary | ICD-10-CM | POA: Diagnosis not present

## 2019-02-09 DIAGNOSIS — Y999 Unspecified external cause status: Secondary | ICD-10-CM | POA: Insufficient documentation

## 2019-02-09 DIAGNOSIS — W19XXXA Unspecified fall, initial encounter: Secondary | ICD-10-CM

## 2019-02-09 DIAGNOSIS — Z79899 Other long term (current) drug therapy: Secondary | ICD-10-CM | POA: Insufficient documentation

## 2019-02-09 DIAGNOSIS — I509 Heart failure, unspecified: Secondary | ICD-10-CM | POA: Insufficient documentation

## 2019-02-09 LAB — URINALYSIS, COMPLETE (UACMP) WITH MICROSCOPIC
Bilirubin Urine: NEGATIVE
Glucose, UA: NEGATIVE mg/dL
Hgb urine dipstick: NEGATIVE
Ketones, ur: NEGATIVE mg/dL
Nitrite: POSITIVE — AB
Protein, ur: 100 mg/dL — AB
Specific Gravity, Urine: 1.008 (ref 1.005–1.030)
Squamous Epithelial / HPF: NONE SEEN (ref 0–5)
WBC, UA: >50 WBC/hpf (ref 0–5)
pH: 5 (ref 5.0–8.0)

## 2019-02-09 LAB — HEPATIC FUNCTION PANEL
ALT: 9 U/L (ref 0–44)
AST: 18 U/L (ref 15–41)
Albumin: 3.4 g/dL — ABNORMAL LOW (ref 3.5–5.0)
Alkaline Phosphatase: 67 U/L (ref 38–126)
Bilirubin, Direct: 0.1 mg/dL (ref 0.0–0.2)
Indirect Bilirubin: 0.4 mg/dL (ref 0.3–0.9)
Total Bilirubin: 0.5 mg/dL (ref 0.3–1.2)
Total Protein: 6.8 g/dL (ref 6.5–8.1)

## 2019-02-09 LAB — BASIC METABOLIC PANEL
Anion gap: 9 (ref 5–15)
BUN: 16 mg/dL (ref 8–23)
CO2: 21 mmol/L — ABNORMAL LOW (ref 22–32)
Calcium: 8.4 mg/dL — ABNORMAL LOW (ref 8.9–10.3)
Chloride: 105 mmol/L (ref 98–111)
Creatinine, Ser: 1.14 mg/dL — ABNORMAL HIGH (ref 0.44–1.00)
GFR calc Af Amer: 50 mL/min — ABNORMAL LOW (ref >60)
GFR calc non Af Amer: 43 mL/min — ABNORMAL LOW (ref >60)
Glucose, Bld: 146 mg/dL — ABNORMAL HIGH (ref 70–99)
Potassium: 5 mmol/L (ref 3.5–5.1)
Sodium: 135 mmol/L (ref 135–145)

## 2019-02-09 LAB — CBC
HCT: 30 % — ABNORMAL LOW (ref 36.0–46.0)
Hemoglobin: 9.3 g/dL — ABNORMAL LOW (ref 12.0–15.0)
MCH: 29.2 pg (ref 26.0–34.0)
MCHC: 31 g/dL (ref 30.0–36.0)
MCV: 94 fL (ref 80.0–100.0)
Platelets: 261 10*3/uL (ref 150–400)
RBC: 3.19 MIL/uL — ABNORMAL LOW (ref 3.87–5.11)
RDW: 14.7 % (ref 11.5–15.5)
WBC: 6.5 10*3/uL (ref 4.0–10.5)
nRBC: 0 % (ref 0.0–0.2)

## 2019-02-09 LAB — LIPASE, BLOOD: Lipase: 24 U/L (ref 11–51)

## 2019-02-09 MED ORDER — SODIUM CHLORIDE 0.9% FLUSH: 3.0000 mL | Freq: Once | INTRAVENOUS | Status: DC

## 2019-02-09 MED ORDER — IOHEXOL 300 MG/ML  SOLN
75.0000 mL | Freq: Once | INTRAMUSCULAR | Status: AC | PRN
  Administered 2019-02-09: 75 mL via INTRAVENOUS

## 2019-02-09 MED ORDER — CEPHALEXIN 500 MG PO CAPS: 500.0000 mg | ORAL_CAPSULE | Freq: Two times a day (BID) | ORAL | 0 refills | Status: DC

## 2019-02-09 NOTE — Discharge Instructions (Addendum)
Take Tylenol 1000 mg every 6 hours as needed for pain  Make sure you drink plenty of fluids, at least 6-8 glasses daily  Be VERY careful when going from sitting to standing or changing positions

## 2019-02-09 NOTE — ED Triage Notes (Addendum)
Pt presents to ED via POV with c/o fall, head injury, R shoulder pain, and dizziness. Pt states she got up to go to the bathroom, got dizzy and fell. Pt states she hit her head, hx of R shoulder pain due to arthritis but pain is worse after fall. Pt with large hematoma to L side of her head at this time. Pt states she is on bloodthinners at this time.

## 2019-02-09 NOTE — ED Triage Notes (Signed)
Says she was on [facility] at home and she started "feeling funny"  She went to get up and fell forward hitting her forehead on the bed rail.  No loc.

## 2019-02-09 NOTE — ED Provider Notes (Addendum)
[facility] Emergency Department Provider Note  ____________________________________________   First MD Initiated Contact with Patient 02/09/19 1318     (approximate)  I have reviewed the triage vital signs and the nursing notes.   HISTORY  Chief Complaint Fall, Head Injury, and Dizziness    HPI Kristen Barron is a 85 y.o. female with past medical history as below here with mild dizziness followed by syncopal episode.  The patient states that she was essentially in her usual state of health until yesterday.  She does note slight increase in loose bowel movements, though she has a history of frequent changes in her bowels and GI issues.  No blood in her stools.  She states that she woke up this morning and went to the restroom.  She walked quickly to the restroom, then sat down and used it.  She states she began to feel mildly lightheaded like she is going to pass out while she was sitting.  She then tried to stand up to go back to the room, and briefly lost consciousness.  She regained consciousness when she fell.  However, she did strike her head and had difficulty getting up.  She has a history of falls, with similar symptoms, and states she often cannot get up after falling.  Denies any recent fevers or chills.  No abdominal pain.  She felt generally weak after the fall but now feels back to her baseline and is questing to go home.  She denies any chest pain or palpitations.  Family states she is at her baseline mental status.        Past Medical History:  Diagnosis Date   Arthritis    Breast cancer ([place]) 03/06/2014   right breast with mastectomy and chemo   Cancer [facility])    Reports lumpectomy for a cyst   CHF (congestive heart failure) ([place])    Collagen vascular disease ([place])    Diabetes ([place])    DVT (deep venous thrombosis) ([place])    Hemorrhoids    Hypertension    Renal disorder    Renal insufficiency    Stroke [facility])    August 2014, but  has had strokes prior as well    Patient Active Problem List   Diagnosis Date Noted   AKI (acute kidney injury) ([place]) 09/14/2018   Acute on chronic heart failure ([place]) 09/06/2018   Chronic diastolic heart failure ([place]) 12/15/2017   Lymphedema 12/15/2017   Pressure injury of skin 06/08/2017   Vomiting    Gallstone    Osteoarthritis of knee (Bilateral) (L>R) 12/26/2015   Chronic pain 12/11/2015   Long term current use of opiate analgesic 12/11/2015   Encounter for therapeutic drug level monitoring 12/11/2015   Chronic knee pain (Location of Primary Source of Pain) (Bilateral) (L>R) 12/11/2015   Long term prescription opiate use 12/11/2015   Opiate use (20 MME/Day) 12/11/2015   Chronic hand pain (Location of Secondary source of pain) (Bilateral) (L>R) 12/11/2015   Failed back surgical syndrome x 2 (Surgeon: Dr. Deri Fuelling) 12/11/2015   Lumbar spondylosis 12/11/2015   Chronic low back pain (Location of Tertiary source of pain) (Bilateral) (L>R) 12/11/2015   Lumbar facet syndrome 12/11/2015   Chronic lower extremity pain (Bilateral) (L>R) 12/11/2015   Chronic neck pain (Left) 12/11/2015   Cervical spondylosis 12/11/2015   Chronic shoulder pain (Right) 12/11/2015   Insulin dependent diabetes mellitus ([place]) 12/11/2015   Abnormal MRI, lumbar spine (07/19/2013) 12/11/2015   Lumbar postlaminectomy syndrome (L4-5) 12/11/2015  Fusion of lumbar spine (L4-5 Ray cages) 12/11/2015   Lumbar Levoscoliosis with apex at L4 12/11/2015   Grade 1 Retrolisthesis of L2 over L3 and L3 over L4 12/11/2015   Lumbar facet arthropathy 12/11/2015   Lumbar foraminal stenosis (Bilateral L2-3, L3-4 & Left L5-S1) 12/11/2015   Chronic anticoagulation (Plavix) 12/11/2015   Breast cancer of lower-outer quadrant of right female breast ([place]) 02/28/2014   Lumbar stenosis with neurogenic claudication 07/24/2013   Lumbar central spinal stenosis ( Severe at L3-4) (Mild R>L L2-3 &  L5-S1) 07/20/2013   Hyponatremia 07/20/2013   Hypertension 07/20/2013   Normocytic anemia 07/20/2013   CKD (chronic kidney disease) stage 3, GFR 30-59 ml/min (HCC) 07/20/2013    Lumbar DDD (degenerative disc disease) 07/20/2013    Past Surgical History:  Procedure Laterality Date   ABDOMINAL HYSTERECTOMY     Patient not clear as to why   BACK SURGERY     BREAST BIOPSY Right February 15 2014   invasive mammary carcinoma/ER/PR negative, HER 2 positive   BREAST BIOPSY Right 1999   negative biopsy   BREAST SURGERY Right 03/06/14   mastectomy   COLONOSCOPY WITH PROPOFOL N/A 04/15/2016   Procedure: COLONOSCOPY WITH PROPOFOL;  Surgeon: Manya Silvas, MD;  Location: [place];  Service: Endoscopy;  Laterality: N/A;   ESOPHAGOGASTRODUODENOSCOPY (EGD) WITH PROPOFOL N/A 04/15/2016   Procedure: ESOPHAGOGASTRODUODENOSCOPY (EGD) WITH PROPOFOL;  Surgeon: Manya Silvas, MD;  Location[facility] ENDOSCOPY;  Service: Endoscopy;  Laterality: N/A;   HAND SURGERY Right    Carpel tunnel release in the 1970s   LUMBAR LAMINECTOMY/DECOMPRESSION MICRODISCECTOMY Bilateral 07/24/2013   Procedure: LUMBAR LAMINECTOMY/DECOMPRESSION MICRODISCECTOMY LUMBAR THREE-FOUR;  Surgeon: Charlie Pitter, MD;  Location: [place] NEURO ORS;  Service: Neurosurgery;  Laterality: Bilateral;   MASTECTOMY Right 03/06/2014   right br ca. Dr Jamal Collin   OOPHORECTOMY      Prior to Admission medications   Medication Sig Start Date End Date Taking? Authorizing Provider  benazepril (LOTENSIN) 40 MG tablet Take 1 tablet (40 mg total) by mouth daily. 12/12/17   Nicholes Mango, MD  clopidogrel (PLAVIX) 75 MG tablet Take 75 mg by mouth daily with breakfast.    [provider]  dicyclomine (BENTYL) 10 MG capsule Take 10 mg by mouth 4 (four) times daily as needed.  05/16/18   [provider]  gabapentin (NEURONTIN) 600 MG tablet Take 600 mg by mouth daily.  11/25/16   [provider]  Glucosamine 500 MG CAPS Take 500  mg by mouth 3 (three) times daily.    [provider]  hydrALAZINE (APRESOLINE) 50 MG tablet Take 50 mg by mouth daily.     [provider]  insulin aspart (NOVOLOG) 100 UNIT/ML injection Inject 4 Units into the skin 3 (three) times daily before meals. Patient is only taking with biggest meal    [provider]  insulin detemir (LEVEMIR) 100 UNIT/ML injection Inject 18 Units into the skin at bedtime.    [provider]  magnesium oxide (MAG-OX) 400 MG tablet Take 400 mg by mouth 2 (two) times daily.    [provider]  metolazone (ZAROXOLYN) 5 MG tablet Take 5 mg by mouth daily.    [provider]  metoprolol succinate (TOPROL-XL) 50 MG 24 hr tablet Take 1 tablet (50 mg total) by mouth daily. Take with or immediately following a meal. 09/16/18   Fritzi Mandes, MD  [facility] 5-325 MG tablet Take 1 tablet by mouth every 6 (six) hours as needed (back  pain).  10/19/17   [provider]  Omega 3 1200 MG CAPS Take 1 capsule by mouth 3 (three) times daily.     [provider]  ondansetron (ZOFRAN-ODT) 4 MG disintegrating tablet Take 4 mg by mouth every 8 (eight) hours as needed for nausea or vomiting.    [provider]  oxybutynin (DITROPAN-XL) 5 MG 24 hr tablet Take 5 mg by mouth at bedtime.    [provider]  pantoprazole (PROTONIX) 40 MG tablet Take 40 mg by mouth daily.    [provider]  pravastatin (PRAVACHOL) 80 MG tablet Take 80 mg by mouth every evening.     [provider]  torsemide (DEMADEX) 20 MG tablet Take 40 mg by mouth daily.    [provider]  traZODone (DESYREL) 50 MG tablet Take 50 mg by mouth at bedtime.    [provider]    Allergies Patient has no known allergies.  Family History  Problem Relation Age of Onset   CAD Father    Cancer Sister 74       breast   Breast cancer Sister 31   Cancer Brother        kidney   Cancer Maternal Aunt        breast    Breast cancer Maternal Aunt 70   Cancer Other        maternal niece with breast cancer   Breast cancer Other    Cancer Sister        pancreatic    Social History Social History   Tobacco Use   Smoking status: Never Smoker   Smokeless tobacco: Never Used  Substance Use Topics   Alcohol use: No   Drug use: No    Review of Systems  Review of Systems  Constitutional: Positive for fatigue. Negative for fever.  HENT: Negative for congestion and sore throat.   Eyes: Negative for visual disturbance.  Respiratory: Negative for cough and shortness of breath.   Cardiovascular: Negative for chest pain.  Gastrointestinal: Positive for diarrhea and nausea. Negative for abdominal pain and vomiting.  Genitourinary: Negative for flank pain.  Musculoskeletal: Negative for back pain and neck pain.  Skin: Negative for rash and wound.  Neurological: Positive for weakness and light-headedness.  All other systems reviewed and are negative.    ____________________________________________  PHYSICAL EXAM:      VITAL SIGNS: ED Triage Vitals  Enc Vitals Group     BP 02/09/19 1131 (!) 157/57     Pulse Rate 02/09/19 1131 75     Resp 02/09/19 1131 20     Temp 02/09/19 1131 98.5 F (36.9 C)     Temp Source 02/09/19 1131 Oral     SpO2 02/09/19 1131 96 %     Weight 02/09/19 1127 189 lb (85.7 kg)     Height 02/09/19 1127 5\' 1"  (1.549 m)     Head Circumference --      Peak Flow --      Pain Score 02/09/19 1126 9     Pain Loc --      Pain Edu? --      Excl. in [place]? --      Physical Exam Vitals signs and nursing note reviewed.  Constitutional:      General: She is not in acute distress.    Appearance: She is well-developed.  HENT:     Head: Normocephalic and atraumatic.     Comments: Moist mucous membranes.  Large contusion  to the left forehead, with no open lacerations.  Minimal tenderness.  No crepitance. Eyes:     Conjunctiva/sclera: Conjunctivae normal.  Neck:      Musculoskeletal: Neck supple.  Cardiovascular:     Rate and Rhythm: Normal rate and regular rhythm.     Heart sounds: Normal heart sounds. No murmur. No friction rub.  Pulmonary:     Effort: Pulmonary effort is normal. No respiratory distress.     Breath sounds: Normal breath sounds. No wheezing or rales.  Abdominal:     General: There is no distension.     Palpations: Abdomen is soft.     Tenderness: There is abdominal tenderness in the left lower quadrant.     Comments: Minimal left lower quadrant tenderness  Musculoskeletal:     Comments: Moderate tenderness to palpation over anterior lateral right shoulder. Exxquisite tenderness across midline lower lumbar spine, also the paraspinal spaces.  No step-offs or deformity.  Skin:    General: Skin is warm.     Capillary Refill: Capillary refill takes less than 2 seconds.  Neurological:     Mental Status: She is alert and oriented to person, place, and time.     GCS: GCS eye subscore is 4. GCS verbal subscore is 5. GCS motor subscore is 6.     Motor: No abnormal muscle tone.       ____________________________________________   LABS (all labs ordered are listed, but only abnormal results are displayed)  Labs Reviewed  BASIC METABOLIC PANEL - Abnormal; Notable for the following components:      Result Value   CO2 21 (*)    Glucose, Bld 146 (*)    Creatinine, Ser 1.14 (*)    Calcium 8.4 (*)    GFR calc non Af Amer 43 (*)    GFR calc Af Amer 50 (*)    All other components within normal limits  CBC - Abnormal; Notable for the following components:   RBC 3.19 (*)    Hemoglobin 9.3 (*)    HCT 30.0 (*)    All other components within normal limits  HEPATIC FUNCTION PANEL - Abnormal; Notable for the following components:   Albumin 3.4 (*)    All other components within normal limits  LIPASE, BLOOD  URINALYSIS, COMPLETE (UACMP) WITH MICROSCOPIC    ____________________________________________  EKG: Normal sinus rhythm,  ventricular rate 75.  PR 134, QRS 94, QTc 431.  No acute ischemic changes. ________________________________________  RADIOLOGY All imaging, including plain films, CT scans, and ultrasounds, independently reviewed by me, and interpretations confirmed via formal radiology reads.  ED MD interpretation:   CT head: No acute normality  Plain films: No acute abnormality  Official radiology report(s): Dg Chest 2 View  Result Date: 02/09/2019 CLINICAL DATA:  Chest wall pain after fall. EXAM: CHEST - 2 VIEW COMPARISON:  Radiograph of September 14, 2018. FINDINGS: The heart size and mediastinal contours are within normal limits. Both lungs are clear. No pneumothorax or pleural effusion is noted. The visualized skeletal structures are unremarkable. IMPRESSION: No active cardiopulmonary disease. Aortic Atherosclerosis (ICD10-I70.0). Electronically Signed   By: Marijo Conception M.D.   On: 02/09/2019 14:18   Dg Shoulder Right  Result Date: 02/09/2019 CLINICAL DATA:  [place] yesterday.  Right shoulder pain. EXAM: RIGHT SHOULDER - 2+ VIEW COMPARISON:  None. FINDINGS: Advanced AC joint and glenohumeral joint degenerative changes. There is marked joint space narrowing and moderate spurring. Subchondral cystic changes are also noted. Marked narrowing of the humeroacromial space  highly suspicious for rotator cuff disease. The visualized right lung is clear and the visualized right ribs are intact. IMPRESSION: 1. Advanced degenerative changes noted at the right shoulder. 2. No fracture or dislocation. 3. Marked narrowing of the humeroacromial space suggesting rotator cuff disease. Electronically Signed   By: Marijo Sanes M.D.   On: 02/09/2019 14:18   Ct Head Wo Contrast  Result Date: 02/09/2019 CLINICAL DATA:  Head trauma with high clinical risk EXAM: CT HEAD WITHOUT CONTRAST TECHNIQUE: Contiguous axial images were obtained from the base of the skull through the vertex without intravenous contrast. COMPARISON:  09/14/2018  FINDINGS: Brain: No evidence of acute infarction, hemorrhage, hydrocephalus, extra-axial collection or mass effect. Cerebral volume loss with mild periventricular chronic small vessel ischemia. Subcentimeter incidental lipoma with calcification at the left CP angle. Vascular: Atherosclerotic calcification Skull: Left forehead swelling without calvarial fracture. Sinuses/Orbits: Right sphenoid sinusitis that is chronic and incidental to the history. IMPRESSION: 1. No evidence of intracranial injury. 2. Forehead swelling without calvarial fracture. Electronically Signed   By: Monte Fantasia M.D.   On: 02/09/2019 12:09   Ct Abdomen Pelvis W Contrast  Result Date: 02/09/2019 CLINICAL DATA:  Acute abdominal pain.  Diverticulitis suspected. EXAM: CT ABDOMEN AND PELVIS WITH CONTRAST TECHNIQUE: Multidetector CT imaging of the abdomen and pelvis was performed using the standard protocol following bolus administration of intravenous contrast. CONTRAST:  48mL OMNIPAQUE IOHEXOL 300 MG/ML  SOLN COMPARISON:  September 18, 2018 FINDINGS: Lower chest: The lung bases are clear. The heart is enlarged. Hepatobiliary: The liver is normal. The gallbladder is distended. Gallstones are again noted.There is no biliary ductal dilation. Pancreas: Normal contours without ductal dilatation. No peripancreatic fluid collection. Spleen: Innumerable calcifications are noted throughout the spleen. Adrenals/Urinary Tract: --Adrenal glands: No adrenal hemorrhage. --Right kidney/ureter: No hydronephrosis or perinephric hematoma. --Left kidney/ureter: No hydronephrosis or perinephric hematoma. --Urinary bladder: Unremarkable. Stomach/Bowel: --Stomach/Duodenum: No hiatal hernia or other gastric abnormality. Normal duodenal course and caliber. --Small bowel: No dilatation or inflammation. --Colon: Rectosigmoid diverticulosis without acute inflammation. --Appendix: Not visualized. No right lower quadrant inflammation or free fluid. Vascular/Lymphatic:  Atherosclerotic calcification is present within the non-aneurysmal abdominal aorta, without hemodynamically significant stenosis. --No retroperitoneal lymphadenopathy. --No mesenteric lymphadenopathy. --No pelvic or inguinal lymphadenopathy. Reproductive: Status post hysterectomy. No adnexal mass. Other: No ascites or free air. There is a small fat containing periumbilical hernia. Musculoskeletal. Multilevel degenerative disc disease and facet arthrosis. No bony spinal canal stenosis. IMPRESSION: 1. No acute intra-abdominal abnormality detected. 2. Cholelithiasis without CT evidence for acute cholecystitis. 3. Multiple additional chronic findings as above, not substantially changed from prior CT in February. Electronically Signed   By: Constance Holster M.D.   On: 02/09/2019 15:26    ____________________________________________  PROCEDURES   Procedure(s) performed (including Critical Care):  Procedures  ____________________________________________  INITIAL IMPRESSION / MDM / [place] / ED COURSE  As part of my medical decision making, I reviewed the following data within the electronic MEDICAL RECORD NUMBER Notes from prior ED visits and [place] Controlled Substance Database      *AISHWARYA SHIPLETT was evaluated in Emergency Department on 02/09/2019 for the symptoms described in the history of present illness. She was evaluated in the context of the global COVID-19 pandemic, which necessitated consideration that the patient might be at risk for infection with the SARS-CoV-2 virus that causes COVID-19. Institutional protocols and algorithms that pertain to the evaluation of patients at risk for COVID-19 are in a state of rapid change based on  information released by regulatory bodies including the CDC and federal and state organizations. These policies and algorithms were followed during the patient's care in the ED.  Some ED evaluations and interventions may be delayed as a result of limited staffing  during the pandemic.*   Clinical Course as of Feb 09 1527  Thu Jul 02, 451  [id number] 85 year old female here with fall and head injury.  Regarding her head injury, CT shows no acute normality.  She is neurologically at her baseline.  No lacerations.  Discussed risk of rebleed and close monitoring at home with patient and family.  Regarding her right shoulder pain, plain films negative.  No signs of rib injury on plain films of the chest.  Lumbar imaging r pending.  Regarding the etiology of her fall, her history is most consistent with likely orthostatic syncope.  She has a known history of similar episodes.  However, she has had some increased diarrhea with history of diverticulosis per report and review of prior scans.  Will check basic labs and CT.   [CI]    Clinical Course User Index [CI] Duffy Bruce, MD    Medical Decision Making: 85 yo M here with fall and syncope:  Plan at sign out: Trauma -CT head neg -Plain films neg -CT Spine pending   Fall -Likely orthostatic -Labs reassuring -CT A/P negative -F/u UA ____________________________________________  FINAL CLINICAL IMPRESSION(S) / ED DIAGNOSES  Final diagnoses:  Low back pain  Contusion of forehead, initial encounter  Fall, initial encounter  Chronic right shoulder pain     MEDICATIONS GIVEN DURING THIS VISIT:  Medications  sodium chloride flush (NS) 0.9 % injection 3 mL (has no administration in time range)  iohexol (OMNIPAQUE) 300 MG/ML solution 75 mL (75 mLs Intravenous Contrast Given 02/09/19 1451)     ED Discharge Orders    None       Note:  This document was prepared using Dragon voice recognition software and may include unintentional dictation errors.   Duffy Bruce, MD 02/09/19 1528    Duffy Bruce, MD 02/09/19 [phone]

## 2019-04-10 ENCOUNTER — Ambulatory Visit
Admission: RE | Admit: 2019-04-10 | Discharge: 2019-04-10 | Disposition: A | Discharge status: Home / Self Care | Payer: Medicare HMO | Source: Ambulatory Visit | Attending: Oncology | Admitting: Oncology

## 2019-04-10 DIAGNOSIS — Z171 Estrogen receptor negative status [ER-]: Secondary | ICD-10-CM | POA: Insufficient documentation

## 2019-04-10 DIAGNOSIS — Z1231 Encounter for screening mammogram for malignant neoplasm of breast: Secondary | ICD-10-CM | POA: Insufficient documentation

## 2019-04-10 DIAGNOSIS — C50511 Malignant neoplasm of lower-outer quadrant of right female breast: Secondary | ICD-10-CM | POA: Diagnosis not present

## 2019-04-24 ENCOUNTER — Ambulatory Visit: Payer: Medicare HMO | Admitting: Family

## 2019-04-24 NOTE — Progress Notes (Deleted)
Patient ID: Kristen Barron, female    DOB: 07/22/1929, 85 y.o.   MRN: [id number]  HPI  Ms Kreiter is a 85 y/o female with a history of breast cancer, DM, HTN, CKD, stroke, DVT and chronic heart failure.   Echo report from 12/10/17 reviewed and showed an EF of 50-55%.  Was in the ED 02/09/2019 for a fall. CT was negative and patient was discharged.  She presents today for a follow-up visit with a chief complaint of   Past Medical History:  Diagnosis Date  . Arthritis   . Breast cancer ([place]) 03/06/2014   right breast with mastectomy and chemo  . Cancer [facility])    Reports lumpectomy for a cyst  . CHF (congestive heart failure) ([place])   . Collagen vascular disease ([place])   . Diabetes ([place])   . DVT (deep venous thrombosis) ([place])   . Hemorrhoids   . Hypertension   . Renal disorder   . Renal insufficiency   . Stroke [facility])    August 2014, but has had strokes prior as well   Past Surgical History:  Procedure Laterality Date  . ABDOMINAL HYSTERECTOMY     Patient not clear as to why  . BACK SURGERY    . BREAST BIOPSY Right February 15 2014   invasive mammary carcinoma/ER/PR negative, HER 2 positive  . BREAST BIOPSY Right 1999   negative biopsy  . BREAST SURGERY Right 03/06/14   mastectomy  . COLONOSCOPY WITH PROPOFOL N/A 04/15/2016   Procedure: COLONOSCOPY WITH PROPOFOL;  Surgeon: Manya Silvas, MD;  Location: [facility] ENDOSCOPY;  Service: Endoscopy;  Laterality: N/A;  . ESOPHAGOGASTRODUODENOSCOPY (EGD) WITH PROPOFOL N/A 04/15/2016   Procedure: ESOPHAGOGASTRODUODENOSCOPY (EGD) WITH PROPOFOL;  Surgeon: Manya Silvas, MD;  Location: [facility] ENDOSCOPY;  Service: Endoscopy;  Laterality: N/A;  . HAND SURGERY Right    Carpel tunnel release in the 1970s  . LUMBAR LAMINECTOMY/DECOMPRESSION MICRODISCECTOMY Bilateral 07/24/2013   Procedure: LUMBAR LAMINECTOMY/DECOMPRESSION MICRODISCECTOMY LUMBAR THREE-FOUR;  Surgeon: Charlie Pitter, MD;  Location: [place] NEURO ORS;  Service: Neurosurgery;  Laterality: Bilateral;  .  MASTECTOMY Right 03/06/2014   right br ca. Dr Jamal Collin  . OOPHORECTOMY     Family History  Problem Relation Age of Onset  . CAD Father   . Cancer Sister 69       breast  . Breast cancer Sister 24  . Cancer Brother        kidney  . Cancer Maternal Aunt        breast  . Breast cancer Maternal Aunt 70  . Cancer Other        maternal niece with breast cancer  . Breast cancer Other   . Cancer Sister        pancreatic   Social History   Tobacco Use  . Smoking status: Never Smoker  . Smokeless tobacco: Never Used  Substance Use Topics  . Alcohol use: No   No Known Allergies     Review of Systems  Constitutional: Positive for fatigue. Negative for appetite change.  HENT: Positive for hearing loss. Negative for congestion, postnasal drip and sore throat.   Eyes: Negative.   Respiratory: Positive for shortness of breath (with minimal exertion). Negative for chest tightness.   Cardiovascular: Positive for leg swelling. Negative for chest pain and palpitations.  Gastrointestinal: Negative for abdominal distention and abdominal pain.  Endocrine: Negative.   Genitourinary: Negative.  Negative for hematuria.  Musculoskeletal: Positive for arthralgias (legs) and back pain (right lower  back).  Skin: Negative.   Allergic/Immunologic: Negative.   Neurological: Negative for dizziness and light-headedness.  Hematological: Negative for adenopathy. Bruises/bleeds easily.  Psychiatric/Behavioral: Positive for sleep disturbance (waking up frequently). Negative for dysphoric mood. The patient is not nervous/anxious.     Physical Exam Vitals signs and nursing note reviewed.  Constitutional:      Appearance: She is well-developed.  HENT:     Head: Normocephalic and atraumatic.  Neck:     Musculoskeletal: Normal range of motion and neck supple.     Vascular: No JVD.  Cardiovascular:     Rate and Rhythm: Normal rate and regular rhythm.  Pulmonary:     Effort: Pulmonary effort is  normal. No respiratory distress.     Breath sounds: No wheezing or rales.  Abdominal:     General: There is no distension.     Palpations: Abdomen is soft.  Musculoskeletal:     Right lower leg: Edema (trace pitting edema) present.     Left lower leg: Edema (trace pitting edema) present.  Skin:    General: Skin is warm and dry.  Neurological:     Mental Status: She is alert and oriented to person, place, and time.  Psychiatric:        Behavior: Behavior normal.    Assessment & Plan:  1: Chronic heart failure with preserved ejection fraction- - NYHA class III - euvolemic today - not weighing daily and she was encouraged to resume weighing daily and to call for an overnight weight gain of >2 pounds or a weekly weight gain of >5 pounds - weight down 8 pounds from last visit here 2 weeks ago - not adding salt but says that she hasn't been reading food labels much. Reviewed the importance of reading food labels so that she can closely follow a 2000mg  sodium diet.  - BNP 09/14/2018 was 159.0 - feels like she's been taking her medications correctly but admits that she's not 100% sure; discussed using a pillbox as daughter says that she's been resistant to this idea before. Reviewed the importance of taking the medications correctly and that using a pillbox would decrease the chances of error. Patient says that she will think about it. - discussed paramedicine program but patient says that she would like to think about it  2: HTN- - BP looks good today - saw PCP Posey Pronto) ~ 10 days ago - BMP 02/09/2019 reviewed and showed sodium 135, potassium 5.0 , creatinine 1.14 and GFR 43  3: Diabetes-  - glucose at home yesterday was 151 - A1c 12/09/17 was 5.7%  4: Lymphedema- - stage 2 - limited in exercise due to instability and use of walker - encouraged her to elevate her legs when sitting for long periods of time - encouraged her to get compression socks and put them on every morning with removal  at bedtime  Patient did not bring her medications nor a list. Each medication was verbally reviewed with the patient and she was encouraged to bring the bottles to every visit to confirm accuracy of list.  Return in 3 months or sooner for any questions/problems before then.

## 2019-05-04 NOTE — Progress Notes (Signed)
Patient ID: Kristen Barron, female    DOB: August 08, 1929, 85 y.o.   MRN: [id number]  HPI  Kristen Barron is a 85 y/o female with a history of breast cancer, DM, HTN, CKD, stroke, DVT and chronic heart failure.   Echo report from 12/10/17 reviewed and showed an EF of 50-55%.  Was in the ED 02/09/2019 due to fall after syncopal episode. Head CT was negative and she was released.   She presents today for a follow-up visit with a chief complaint of moderate shortness of breath upon minimal exertion. She describes this as chronic in nature having been present for several years. She has associated fatigue, worsening pedal edema, light-headedness (occasionally), chronic difficulty sleeping and gradual weight gain along with this. She denies any abdominal distention, palpitations or chest pain. Currently being treated with an antibiotic for a UTI.   Past Medical History:  Diagnosis Date  . Arthritis   . Breast cancer ([place]) 03/06/2014   right breast with mastectomy and chemo  . Cancer [facility])    Reports lumpectomy for a cyst  . CHF (congestive heart failure) ([place])   . Collagen vascular disease ([facility])   . Diabetes ([facility])   . DVT (deep venous thrombosis) ([facility])   . Hemorrhoids   . Hypertension   . Renal disorder   . Renal insufficiency   . Stroke [facility])    August 2014, but has had strokes prior as well   Past Surgical History:  Procedure Laterality Date  . ABDOMINAL HYSTERECTOMY     Patient not clear as to why  . BACK SURGERY    . BREAST BIOPSY Right February 15 2014   invasive mammary carcinoma/ER/PR negative, HER 2 positive  . BREAST BIOPSY Right 1999   negative biopsy  . BREAST SURGERY Right 03/06/14   mastectomy  . COLONOSCOPY WITH PROPOFOL N/A 04/15/2016   Procedure: COLONOSCOPY WITH PROPOFOL;  Surgeon: Kristen Silvas, MD;  Location: [facility] ENDOSCOPY;  Service: Endoscopy;  Laterality: N/A;  . ESOPHAGOGASTRODUODENOSCOPY (EGD) WITH PROPOFOL N/A 04/15/2016   Procedure: ESOPHAGOGASTRODUODENOSCOPY (EGD) WITH  PROPOFOL;  Surgeon: Kristen Silvas, MD;  Location: [facility] ENDOS[facility];  Service: Endoscopy;  Laterality: N/A;  . HAND SURGERY Right    Carpel tunnel release in the 1970s  . LUMBAR LAMINECTOMY/DECOMPRESSION MICRODISCECTOMY Bilateral 07/24/2013   Procedure: LUMBAR LAMINECTOMY/DECOMPRESSION MICRODISCECTOMY LUMBAR THREE-FOUR;  Surgeon: Charlie Pitter, MD;  Location: [facility];  Service: Neurosurgery;  Laterality: Bilateral;  . MASTECTOMY Right 03/06/2014   right br ca. Dr [facility]  . OOPHORECTOMY     Family History  Problem Relation Age of Onset  . CAD Father   . Cancer Kristen [facility]ron 54       breast  . Breast cancer Kristen Barron 64  . Cancer Kristen Barron        kidney  . Cancer Kristen Barron        breast  . Breast cancer Kristen Barron 70  . Cancer Other        Kristen niece with breast cancer  . Breast cancer Other   . Cancer Kristen Barron        pancreatic   Social History   Tobacco Use  . Smoking status: Never Smoker  . Smokeless tobacco: Never Used  Substance Use Topics  . Alcohol use: No   No Known Allergies  Prior to Admission medications   Medication Sig Start Date End Date Taking? Authorizing [facility]  benazepril (LOTENSIN) 40 MG tablet Take 1 tablet (40 mg total) by mouth daily.  12/12/17  Yes Gouru, Illene Silver, MD  cefUROXime (CEFTIN) 500 MG tablet Take 500 mg by mouth 2 (two) times daily with a meal.   Yes Kristen Barron, Historical, MD  clopidogrel (PLAVIX) 75 MG tablet Take 75 mg by mouth daily with breakfast.   Yes Kristen Barron, Historical, MD  diclofenac sodium (VOLTAREN) 1 % GEL Apply 2 g topically 4 (four) times daily.   Yes Kristen Barron, Historical, MD  gabapentin (NEURONTIN) 600 MG tablet Take 600 mg by mouth 4 (four) times daily.  11/25/16  Yes Kristen Barron, Historical, MD  Glucosamine 500 MG CAPS Take 500 mg by mouth 3 (three) times daily.   Yes Kristen Barron, Historical, MD  hydrALAZINE (APRESOLINE) 50 MG tablet Take 50 mg by mouth 3 (three) times daily.    Yes Kristen Barron, Historical, MD  insulin detemir (LEVEMIR)  100 UNIT/ML injection Inject 10 Units into the skin at bedtime.    Yes Kristen Barron, Historical, MD  lidocaine (XYLOCAINE) 5 % ointment Apply 1 application topically as needed.   Yes Kristen Barron, Historical, MD  loperamide (IMODIUM) 2 MG capsule Take by mouth as needed for diarrhea or loose stools.   Yes Kristen Barron, Historical, MD  magnesium oxide (MAG-OX) 400 MG tablet Take 400 mg by mouth 2 (two) times daily.   Yes Kristen Barron, Historical, MD  metoprolol succinate (TOPROL-XL) 50 MG 24 hr tablet Take 1 tablet (50 mg total) by mouth daily. Take with or immediately following a meal. 09/16/18  Yes Kristen Mandes, MD  [place] Barron 5-325 MG tablet Take 1 tablet by mouth every 6 (six) hours as needed (back pain).  10/19/17  Yes Kristen Barron, Historical, MD  Omega 3 1200 MG CAPS Take 1 capsule by mouth 3 (three) times daily.    Yes Kristen Barron, Historical, MD  pantoprazole (PROTONIX) 40 MG tablet Take 40 mg by mouth daily.   Yes Kristen Barron, Historical, MD  pravastatin (PRAVACHOL) 80 MG tablet Take 80 mg by mouth every evening.    Yes Kristen Barron, Historical, MD  torsemide (DEMADEX) 20 MG tablet Take 40 mg by mouth daily.   Yes Kristen Barron, Historical, MD  triamcinolone cream (KENALOG) 0.1 % Apply 1 application topically 2 (two) times daily.   Yes Kristen Barron, Historical, MD  vitamin B-12 (CYANOCOBALAMIN) 1000 MCG tablet Take 1,000 mcg by mouth daily.   Yes Kristen Barron, Historical, MD  cephALEXin (KEFLEX) 500 MG capsule Take 1 capsule (500 mg total) by mouth 2 (two) times daily. Patient not taking: Reported on 05/05/2019 02/09/19   Kristen Drafts, MD  insulin aspart (NOVOLOG) 100 UNIT/ML injection Inject 4 Units into the skin 3 (three) times daily before meals.     Kristen Barron, Historical, MD  metolazone (ZAROXOLYN) 5 MG tablet Take 5 mg by mouth daily.    Kristen Barron, Historical, MD  oxybutynin (DITROPAN-XL) 5 MG 24 hr tablet Take 5 mg by mouth at bedtime.    Kristen Barron, Historical, MD  traZODone (DESYREL) 50 MG tablet Take 50 mg by mouth at bedtime.     Kristen Barron, Historical, MD    Review of Systems  Constitutional: Positive for fatigue. Negative for appetite change.  HENT: Positive for hearing loss. Negative for congestion, postnasal drip and sore throat.   Eyes: Negative.   Respiratory: Positive for shortness of breath (with minimal exertion). Negative for chest tightness.   Cardiovascular: Positive for leg swelling. Negative for chest pain and palpitations.  Gastrointestinal: Negative for abdominal distention and abdominal pain.  Endocrine: Negative.   Genitourinary: Negative for dysuria and hematuria.  Musculoskeletal: Positive for arthralgias (legs) and back pain (right lower  back).  Skin: Negative.   Allergic/Immunologic: Negative.   Neurological: Positive for light-headedness. Negative for dizziness.  Hematological: Negative for adenopathy. Bruises/bleeds easily.  Psychiatric/Behavioral: Positive for sleep disturbance (waking up frequently). Negative for dysphoric mood. The patient is not nervous/anxious.    Vitals:   05/05/19 1336  BP: (!) 148/68  Pulse: (!) 59  Resp: 18  SpO2: 98%  Weight: 181 lb 6 oz (82.3 kg)  Height: 5\' 1"  (1.549 m)   Wt Readings from Last 3 Encounters:  05/05/19 181 lb 6 oz (82.3 kg)  02/09/19 189 lb (85.7 kg)  12/26/18 171 lb (77.6 kg)   Lab Results  Component Value Date   CREATININE 1.14 (H) 02/09/2019   CREATININE 1.23 (H) 09/18/2018   CREATININE 1.19 (H) 09/15/2018     Physical Exam Vitals signs and nursing note reviewed.  Constitutional:      Appearance: She is well-developed.  HENT:     Head: Normocephalic and atraumatic.  Neck:     Musculoskeletal: Normal range of motion and neck supple.     Vascular: No JVD.  Cardiovascular:     Rate and Rhythm: Normal rate and regular rhythm.  Pulmonary:     Effort: Pulmonary effort is normal. No respiratory distress.     Breath sounds: Examination of the right-upper field reveals rales. Examination of the right-lower field reveals rales.  Examination of the left-lower field reveals rales. Rales present. No wheezing.  Abdominal:     General: There is no distension.     Palpations: Abdomen is soft.  Musculoskeletal:     Right lower leg: Edema (3+ pitting edema) present.     Left lower leg: Edema (2+ pitting edema) present.  Skin:    General: Skin is warm and dry.  Neurological:     Mental Status: She is alert and oriented to person, place, and time.  Psychiatric:        Behavior: Behavior normal.    Assessment & Plan:  1: Acute on Chronic heart failure with preserved ejection fraction- - NYHA class III - moderately fluid overloaded today with rales, increased pedal edema and weight gain - weighing daily and she was instructed to call for an overnight weight gain of >2 pounds or a weekly weight gain of >5 pounds - weight up 9 pounds from last visit here 7 months ago - not adding salt but says that she hasn't been reading food labels much. Reviewed the importance of reading food labels so that she can closely follow a 2000mg  sodium diet.  - BNP 09/14/2018 was 159.0 - will send patient for 80mg  IV lasix/ 44meq PO potassium today - BMP/BNP to be drawn  2: HTN- - BP mildly elevated today - saw PCP Posey Pronto) 04/06/2019 - BMP 02/09/2019 reviewed and showed sodium 135, potassium 5.0 , creatinine 1.14 and GFR 43  3: Diabetes-  - glucose at home yesterday was 100 - A1c 12/09/17 was 5.7%  4: Lymphedema- - stage 2 - limited in exercise due to instability and use of walker - encouraged her to elevate her legs when sitting for long periods of time - she has compression socks but hasn't been wearing them; instructed her to try and get them on if they don't cut into her legs and remove them at bedtime - she says that the swelling doesn't go down much overnight - consider lymphapress compression boots if edema persists  Medication list was reviewed.  Return in 4 days (due to upcoming weekend) for recheck of symptoms.  May need to  recheck labs depending on results from today.

## 2019-05-05 ENCOUNTER — Other Ambulatory Visit: Payer: Self-pay | Admitting: Family

## 2019-05-05 ENCOUNTER — Ambulatory Visit
Admission: RE | Admit: 2019-05-05 | Discharge: 2019-05-05 | Disposition: A | Discharge status: Home / Self Care | Payer: Medicare HMO | Source: Ambulatory Visit | Attending: Family | Admitting: Family

## 2019-05-05 ENCOUNTER — Ambulatory Visit: Payer: Medicare HMO | Admitting: Family

## 2019-05-05 ENCOUNTER — Encounter: Payer: Self-pay | Admitting: Family

## 2019-05-05 ENCOUNTER — Other Ambulatory Visit: Payer: Self-pay

## 2019-05-05 VITALS — BP 148/68 | HR 59 | Resp 18 | Ht 61.0 in | Wt 181.4 lb

## 2019-05-05 DIAGNOSIS — R0602 Shortness of breath: Secondary | ICD-10-CM | POA: Diagnosis present

## 2019-05-05 DIAGNOSIS — I130 Hypertensive heart and chronic kidney disease with heart failure and stage 1 through stage 4 chronic kidney disease, or unspecified chronic kidney disease: Secondary | ICD-10-CM | POA: Diagnosis not present

## 2019-05-05 DIAGNOSIS — N189 Chronic kidney disease, unspecified: Secondary | ICD-10-CM | POA: Diagnosis not present

## 2019-05-05 DIAGNOSIS — I5033 Acute on chronic diastolic (congestive) heart failure: Secondary | ICD-10-CM | POA: Insufficient documentation

## 2019-05-05 DIAGNOSIS — M1990 Unspecified osteoarthritis, unspecified site: Secondary | ICD-10-CM | POA: Diagnosis not present

## 2019-05-05 DIAGNOSIS — I890 Lymphedema, not elsewhere classified: Secondary | ICD-10-CM | POA: Diagnosis not present

## 2019-05-05 DIAGNOSIS — Z7902 Long term (current) use of antithrombotics/antiplatelets: Secondary | ICD-10-CM | POA: Insufficient documentation

## 2019-05-05 DIAGNOSIS — Z86718 Personal history of other venous thrombosis and embolism: Secondary | ICD-10-CM | POA: Insufficient documentation

## 2019-05-05 DIAGNOSIS — IMO0001 Reserved for inherently not codable concepts without codable children: Secondary | ICD-10-CM

## 2019-05-05 DIAGNOSIS — I10 Essential (primary) hypertension: Secondary | ICD-10-CM

## 2019-05-05 DIAGNOSIS — Z8673 Personal history of transient ischemic attack (TIA), and cerebral infarction without residual deficits: Secondary | ICD-10-CM | POA: Diagnosis not present

## 2019-05-05 DIAGNOSIS — Z79899 Other long term (current) drug therapy: Secondary | ICD-10-CM | POA: Insufficient documentation

## 2019-05-05 DIAGNOSIS — Z791 Long term (current) use of non-steroidal anti-inflammatories (NSAID): Secondary | ICD-10-CM | POA: Insufficient documentation

## 2019-05-05 DIAGNOSIS — E1122 Type 2 diabetes mellitus with diabetic chronic kidney disease: Secondary | ICD-10-CM | POA: Insufficient documentation

## 2019-05-05 DIAGNOSIS — Z794 Long term (current) use of insulin: Secondary | ICD-10-CM | POA: Diagnosis not present

## 2019-05-05 DIAGNOSIS — Z853 Personal history of malignant neoplasm of breast: Secondary | ICD-10-CM | POA: Insufficient documentation

## 2019-05-05 LAB — BASIC METABOLIC PANEL
Anion gap: 7 (ref 5–15)
BUN: 18 mg/dL (ref 8–23)
CO2: 21 mmol/L — ABNORMAL LOW (ref 22–32)
Calcium: 8.2 mg/dL — ABNORMAL LOW (ref 8.9–10.3)
Chloride: 107 mmol/L (ref 98–111)
Creatinine, Ser: 1.13 mg/dL — ABNORMAL HIGH (ref 0.44–1.00)
GFR calc Af Amer: 51 mL/min — ABNORMAL LOW (ref >60)
GFR calc non Af Amer: 44 mL/min — ABNORMAL LOW (ref >60)
Glucose, Bld: 145 mg/dL — ABNORMAL HIGH (ref 70–99)
Potassium: 4.4 mmol/L (ref 3.5–5.1)
Sodium: 135 mmol/L (ref 135–145)

## 2019-05-05 LAB — BRAIN NATRIURETIC PEPTIDE: B Natriuretic Peptide: 466 pg/mL — ABNORMAL HIGH (ref 0.0–100.0)

## 2019-05-05 MED ORDER — POTASSIUM CHLORIDE CRYS ER 20 MEQ PO TBCR
EXTENDED_RELEASE_TABLET | ORAL | Status: AC
  Administered 2019-05-05: 40 meq via ORAL
  Filled 2019-05-05: qty 2

## 2019-05-05 MED ORDER — FUROSEMIDE 10 MG/ML IJ SOLN
INTRAMUSCULAR | Status: AC
  Administered 2019-05-05: 80 mg via INTRAVENOUS
  Filled 2019-05-05: qty 8

## 2019-05-05 MED ORDER — FUROSEMIDE 10 MG/ML IJ SOLN
80.0000 mg | Freq: Once | INTRAMUSCULAR | Status: AC
  Administered 2019-05-05: 15:00:00 80 mg via INTRAVENOUS

## 2019-05-05 MED ORDER — POTASSIUM CHLORIDE CRYS ER 20 MEQ PO TBCR
40.0000 meq | EXTENDED_RELEASE_TABLET | Freq: Once | ORAL | Status: AC
  Administered 2019-05-05: 15:00:00 40 meq via ORAL

## 2019-05-05 NOTE — Patient Instructions (Signed)
Continue weighing daily and call for an overnight weight gain of > 2 pounds or a weekly weight gain of >5 pounds. 

## 2019-05-05 NOTE — Progress Notes (Signed)
Pt. Completed IV medications and discharged. Pt. Awake and in NAD with VSS. Pt. IV site intact with dry dressing. Pt. Denies any pain.

## 2019-05-07 NOTE — Progress Notes (Signed)
Patient ID: Kristen Barron, female    DOB: Dec 16, 1928, 85 y.o.   MRN: [id number]  HPI  Ms Lozano is a 85 y/o female with a history of breast cancer, DM, HTN, CKD, stroke, DVT and chronic heart failure.   Echo report from 12/10/17 reviewed and showed an EF of 50-55%.  Was in the ED 02/09/2019 due to fall after syncopal episode. Head CT was negative and she was released.   She presents today for a follow-up visit with a chief complaint of moderate shortness of breath upon minimal exertion. She says that this has been chronic in nature having been present for several years. She doesn't think it's much better since last week. She has associated fatigue, pedal edema and difficulty sleeping along with this. She denies any abdominal distention, palpitations, chest pain, dizziness or weight gain.   Was given 80mg  IV lasix/ 37meq potassium 4 days ago.   Past Medical History:  Diagnosis Date  . Arthritis   . Breast cancer ([place]) 03/06/2014   right breast with mastectomy and chemo  . Cancer [facility])    Reports lumpectomy for a cyst  . CHF (congestive heart failure) ([place])   . Collagen vascular disease ([place])   . Diabetes ([place])   . DVT (deep venous thrombosis) ([place])   . Hemorrhoids   . Hypertension   . Renal disorder   . Renal insufficiency   . Stroke [facility])    August 2014, but has had strokes prior as well   Past Surgical History:  Procedure Laterality Date  . ABDOMINAL HYSTERECTOMY     Patient not clear as to why  . BACK SURGERY    . BREAST BIOPSY Right February 15 2014   invasive mammary carcinoma/ER/PR negative, HER 2 positive  . BREAST BIOPSY Right 1999   negative biopsy  . BREAST SURGERY Right 03/06/14   mastectomy  . COLONOSCOPY WITH PROPOFOL N/A 04/15/2016   Procedure: COLONOSCOPY WITH PROPOFOL;  Surgeon: Manya Silvas, MD;  Location: [facility] ENDOSCOPY;  Service: Endoscopy;  Laterality: N/A;  . ESOPHAGOGASTRODUODENOSCOPY (EGD) WITH PROPOFOL N/A 04/15/2016   Procedure: ESOPHAGOGASTRODUODENOSCOPY  (EGD) WITH PROPOFOL;  Surgeon: Manya Silvas, MD;  Location[facility] ENDOSCOPY;  Service: Endoscopy;  Laterality: N/A;  . HAND SURGERY Right    Carpel tunnel release in the 1970s  . LUMBAR LAMINECTOMY/DECOMPRESSION MICRODISCECTOMY Bilateral 07/24/2013   Procedure: LUMBAR LAMINECTOMY/DECOMPRESSION MICRODISCECTOMY LUMBAR THREE-FOUR;  Surgeon: Charlie Pitter, MD;  Location: [place] NEURO ORS;  Service: Neurosurgery;  Laterality: Bilateral;  . MASTECTOMY Right 03/06/2014   right br ca. Dr Jamal Collin  . OOPHORECTOMY     Family History  Problem Relation Age of Onset  . CAD Father   . Cancer Sister 25       breast  . Breast cancer Sister 64  . Cancer Brother        kidney  . Cancer Maternal Aunt        breast  . Breast cancer Maternal Aunt 70  . Cancer Other        maternal niece with breast cancer  . Breast cancer Other   . Cancer Sister        pancreatic   Social History   Tobacco Use  . Smoking status: Never Smoker  . Smokeless tobacco: Never Used  Substance Use Topics  . Alcohol use: No   No Known Allergies  Prior to Admission medications   Medication Sig Start Date End Date Taking? Authorizing Provider  benazepril (LOTENSIN) 40 MG  tablet Take 1 tablet (40 mg total) by mouth daily. 12/12/17  Yes Gouru, Illene Silver, MD  clopidogrel (PLAVIX) 75 MG tablet Take 75 mg by mouth daily with breakfast.   Yes [provider]  diclofenac sodium (VOLTAREN) 1 % GEL Apply 2 g topically 4 (four) times daily.   Yes [provider]  gabapentin (NEURONTIN) 600 MG tablet Take 600 mg by mouth 4 (four) times daily.  11/25/16  Yes [provider]  Glucosamine 500 MG CAPS Take 500 mg by mouth 3 (three) times daily.   Yes [provider]  hydrALAZINE (APRESOLINE) 50 MG tablet Take 50 mg by mouth 3 (three) times daily.    Yes [provider]  insulin aspart (NOVOLOG) 100 UNIT/ML injection Inject 4 Units into the skin 3 (three) times daily before meals.    Yes [provider]  insulin detemir (LEVEMIR) 100 UNIT/ML injection Inject 10 Units into the skin at bedtime.    Yes [provider]  lidocaine (XYLOCAINE) 5 % ointment Apply 1 application topically as needed.   Yes [provider]  loperamide (IMODIUM) 2 MG capsule Take by mouth as needed for diarrhea or loose stools.   Yes [provider]  magnesium oxide (MAG-OX) 400 MG tablet Take 400 mg by mouth 2 (two) times daily.   Yes [provider]  metoprolol succinate (TOPROL-XL) 50 MG 24 hr tablet Take 1 tablet (50 mg total) by mouth daily. Take with or immediately following a meal. 09/16/18  Yes Fritzi Mandes, MD  [facility] 5-325 MG tablet Take 1 tablet by mouth every 6 (six) hours as needed (back pain).  10/19/17  Yes [provider]  Omega 3 1200 MG CAPS Take 1 capsule by mouth 3 (three) times daily.    Yes [provider]  oxybutynin (DITROPAN-XL) 5 MG 24 hr tablet Take 5 mg by mouth at bedtime.   Yes [provider]  pantoprazole (PROTONIX) 40 MG tablet Take 40 mg by mouth daily.   Yes [provider]  pravastatin (PRAVACHOL) 80 MG tablet Take 80 mg by mouth every evening.    Yes [provider]  torsemide (DEMADEX) 20 MG tablet Take 40 mg by mouth daily.   Yes [provider]  traZODone (DESYREL) 50 MG tablet Take 50 mg by mouth at bedtime.   Yes [provider]  triamcinolone cream (KENALOG) 0.1 % Apply 1 application topically 2 (two) times daily.   Yes [provider]  vitamin B-12 (CYANOCOBALAMIN) 1000 MCG tablet Take 1,000 mcg by mouth daily.   Yes [provider]  cefUROXime (CEFTIN) 500 MG tablet Take 500 mg by mouth 2 (two) times daily with a meal.    [provider]    Review of Systems  Constitutional: Positive for fatigue. Negative for appetite change.  HENT: Positive for hearing loss. Negative for congestion, postnasal drip and sore throat.   Eyes: Negative.    Respiratory: Positive for shortness of breath (with minimal exertion). Negative for chest tightness.   Cardiovascular: Positive for leg swelling. Negative for chest pain and palpitations.  Gastrointestinal: Negative for abdominal distention and abdominal pain.  Endocrine: Negative.   Genitourinary: Negative for dysuria and hematuria.  Musculoskeletal: Positive for arthralgias (legs) and back pain (right lower back).  Skin: Negative.   Allergic/Immunologic: Negative.   Neurological: Negative for dizziness and light-headedness.  Hematological: Negative for adenopathy. Bruises/bleeds easily.  Psychiatric/Behavioral: Positive for sleep disturbance (waking up frequently). Negative for dysphoric mood. The patient  is not nervous/anxious.    Vitals:   05/09/19 1306  BP: (!) 184/47  Pulse: 64  Resp: 18  SpO2: 99%  Weight: 177 lb 8 oz (80.5 kg)  Height: 5\' 1"  (1.549 m)   Wt Readings from Last 3 Encounters:  05/09/19 177 lb 8 oz (80.5 kg)  05/05/19 181 lb 6 oz (82.3 kg)  02/09/19 189 lb (85.7 kg)   Lab Results  Component Value Date   CREATININE 1.13 (H) 05/05/2019   CREATININE 1.14 (H) 02/09/2019   CREATININE 1.23 (H) 09/18/2018    Physical Exam Vitals signs and nursing note reviewed.  Constitutional:      Appearance: She is well-developed.  HENT:     Head: Normocephalic and atraumatic.  Neck:     Musculoskeletal: Normal range of motion and neck supple.     Vascular: No JVD.  Cardiovascular:     Rate and Rhythm: Normal rate and regular rhythm.  Pulmonary:     Effort: Pulmonary effort is normal. No respiratory distress.     Breath sounds: Examination of the right-upper field reveals rales. Examination of the right-lower field reveals rales. Rales present. No wheezing.  Abdominal:     General: There is no distension.     Palpations: Abdomen is soft.  Musculoskeletal:     Right lower leg: Edema (3+ pitting edema) present.     Left lower leg: Edema (2+ pitting edema) present.   Skin:    General: Skin is warm and dry.  Neurological:     Mental Status: She is alert and oriented to person, place, and time.  Psychiatric:        Behavior: Behavior normal.    Assessment & Plan:  1: Acute on Chronic heart failure with preserved ejection fraction- - NYHA class III - minimally fluid overloaded today although weight is down from last visit - weighing daily and she was instructed to call for an overnight weight gain of >2 pounds or a weekly weight gain of >5 pounds - weight down 4 pounds from last visit here 4 days ago - not adding salt but says that she hasn't been reading food labels much. Reviewed the importance of reading food labels so that she can closely follow a 2000mg  sodium diet.  - BNP 05/05/2019 was 466.0 - received 80mg  IV lasix/ 10meq PO potassium 4 days ago - will increase torsemide to 60mg  QAM; discussed changing diuretic in the future if she doesn't respond  2: HTN- - BP elevated today; increasing diuretic per above - saw PCP Posey Pronto) 04/06/2019 - BMP 05/05/2019 reviewed and showed sodium 135, potassium 4.4 , creatinine 1.13 and GFR 44  3: Diabetes-  - A1c 12/09/17 was 5.7%  4: Lymphedema- - stage 2 - limited in exercise due to instability and use of walker - encouraged her to elevate her legs when sitting for long periods of time - wearing compression socks daily with removal at bedtime - she says that the swelling doesn't go down much overnight - consider lymphapress compression boots if edema persists  Medication list was reviewed.  Return in 3 weeks or sooner for any questions/problems before then.

## 2019-05-09 ENCOUNTER — Other Ambulatory Visit: Payer: Self-pay

## 2019-05-09 ENCOUNTER — Ambulatory Visit: Payer: Medicare HMO | Attending: Family | Admitting: Family

## 2019-05-09 ENCOUNTER — Encounter: Payer: Self-pay | Admitting: Family

## 2019-05-09 VITALS — BP 184/47 | HR 64 | Resp 18 | Ht 61.0 in | Wt 177.5 lb

## 2019-05-09 DIAGNOSIS — Z79899 Other long term (current) drug therapy: Secondary | ICD-10-CM | POA: Diagnosis not present

## 2019-05-09 DIAGNOSIS — Z90710 Acquired absence of both cervix and uterus: Secondary | ICD-10-CM | POA: Diagnosis not present

## 2019-05-09 DIAGNOSIS — M1990 Unspecified osteoarthritis, unspecified site: Secondary | ICD-10-CM | POA: Insufficient documentation

## 2019-05-09 DIAGNOSIS — Z791 Long term (current) use of non-steroidal anti-inflammatories (NSAID): Secondary | ICD-10-CM | POA: Diagnosis not present

## 2019-05-09 DIAGNOSIS — Z86718 Personal history of other venous thrombosis and embolism: Secondary | ICD-10-CM | POA: Insufficient documentation

## 2019-05-09 DIAGNOSIS — I890 Lymphedema, not elsewhere classified: Secondary | ICD-10-CM | POA: Diagnosis not present

## 2019-05-09 DIAGNOSIS — Z90721 Acquired absence of ovaries, unilateral: Secondary | ICD-10-CM | POA: Diagnosis not present

## 2019-05-09 DIAGNOSIS — Z8249 Family history of ischemic heart disease and other diseases of the circulatory system: Secondary | ICD-10-CM | POA: Diagnosis not present

## 2019-05-09 DIAGNOSIS — Z794 Long term (current) use of insulin: Secondary | ICD-10-CM | POA: Insufficient documentation

## 2019-05-09 DIAGNOSIS — Z8673 Personal history of transient ischemic attack (TIA), and cerebral infarction without residual deficits: Secondary | ICD-10-CM | POA: Insufficient documentation

## 2019-05-09 DIAGNOSIS — Z800 Family history of malignant neoplasm of digestive organs: Secondary | ICD-10-CM | POA: Diagnosis not present

## 2019-05-09 DIAGNOSIS — Z7902 Long term (current) use of antithrombotics/antiplatelets: Secondary | ICD-10-CM | POA: Diagnosis not present

## 2019-05-09 DIAGNOSIS — I130 Hypertensive heart and chronic kidney disease with heart failure and stage 1 through stage 4 chronic kidney disease, or unspecified chronic kidney disease: Secondary | ICD-10-CM | POA: Diagnosis not present

## 2019-05-09 DIAGNOSIS — E1122 Type 2 diabetes mellitus with diabetic chronic kidney disease: Secondary | ICD-10-CM | POA: Diagnosis not present

## 2019-05-09 DIAGNOSIS — IMO0001 Reserved for inherently not codable concepts without codable children: Secondary | ICD-10-CM

## 2019-05-09 DIAGNOSIS — Z803 Family history of malignant neoplasm of breast: Secondary | ICD-10-CM | POA: Insufficient documentation

## 2019-05-09 DIAGNOSIS — N189 Chronic kidney disease, unspecified: Secondary | ICD-10-CM | POA: Diagnosis not present

## 2019-05-09 DIAGNOSIS — Z853 Personal history of malignant neoplasm of breast: Secondary | ICD-10-CM | POA: Diagnosis not present

## 2019-05-09 DIAGNOSIS — Z9011 Acquired absence of right breast and nipple: Secondary | ICD-10-CM | POA: Insufficient documentation

## 2019-05-09 DIAGNOSIS — Z8051 Family history of malignant neoplasm of kidney: Secondary | ICD-10-CM | POA: Diagnosis not present

## 2019-05-09 DIAGNOSIS — I10 Essential (primary) hypertension: Secondary | ICD-10-CM

## 2019-05-09 DIAGNOSIS — I5033 Acute on chronic diastolic (congestive) heart failure: Secondary | ICD-10-CM | POA: Diagnosis not present

## 2019-05-09 NOTE — Patient Instructions (Addendum)
Continue weighing daily and call for an overnight weight gain of > 2 pounds or a weekly weight gain of >5 pounds.  Increase torsemide (fluid pill) to 3 tablets every morning

## 2019-05-31 ENCOUNTER — Encounter: Payer: Self-pay | Admitting: Family

## 2019-05-31 ENCOUNTER — Other Ambulatory Visit: Payer: Self-pay

## 2019-05-31 ENCOUNTER — Ambulatory Visit: Payer: Medicare HMO | Attending: Family | Admitting: Family

## 2019-05-31 VITALS — BP 187/48 | HR 70 | Ht 61.0 in | Wt 177.2 lb

## 2019-05-31 DIAGNOSIS — Z853 Personal history of malignant neoplasm of breast: Secondary | ICD-10-CM | POA: Diagnosis not present

## 2019-05-31 DIAGNOSIS — Z7902 Long term (current) use of antithrombotics/antiplatelets: Secondary | ICD-10-CM | POA: Diagnosis not present

## 2019-05-31 DIAGNOSIS — I5032 Chronic diastolic (congestive) heart failure: Secondary | ICD-10-CM

## 2019-05-31 DIAGNOSIS — M1990 Unspecified osteoarthritis, unspecified site: Secondary | ICD-10-CM | POA: Insufficient documentation

## 2019-05-31 DIAGNOSIS — Z9011 Acquired absence of right breast and nipple: Secondary | ICD-10-CM | POA: Insufficient documentation

## 2019-05-31 DIAGNOSIS — I10 Essential (primary) hypertension: Secondary | ICD-10-CM

## 2019-05-31 DIAGNOSIS — Z800 Family history of malignant neoplasm of digestive organs: Secondary | ICD-10-CM | POA: Diagnosis not present

## 2019-05-31 DIAGNOSIS — E119 Type 2 diabetes mellitus without complications: Secondary | ICD-10-CM

## 2019-05-31 DIAGNOSIS — Z9221 Personal history of antineoplastic chemotherapy: Secondary | ICD-10-CM | POA: Insufficient documentation

## 2019-05-31 DIAGNOSIS — N189 Chronic kidney disease, unspecified: Secondary | ICD-10-CM | POA: Insufficient documentation

## 2019-05-31 DIAGNOSIS — Z8673 Personal history of transient ischemic attack (TIA), and cerebral infarction without residual deficits: Secondary | ICD-10-CM | POA: Diagnosis not present

## 2019-05-31 DIAGNOSIS — Z803 Family history of malignant neoplasm of breast: Secondary | ICD-10-CM | POA: Diagnosis not present

## 2019-05-31 DIAGNOSIS — Z8249 Family history of ischemic heart disease and other diseases of the circulatory system: Secondary | ICD-10-CM | POA: Insufficient documentation

## 2019-05-31 DIAGNOSIS — I890 Lymphedema, not elsewhere classified: Secondary | ICD-10-CM

## 2019-05-31 DIAGNOSIS — Z794 Long term (current) use of insulin: Secondary | ICD-10-CM | POA: Diagnosis not present

## 2019-05-31 DIAGNOSIS — Z79899 Other long term (current) drug therapy: Secondary | ICD-10-CM | POA: Diagnosis not present

## 2019-05-31 DIAGNOSIS — Z8051 Family history of malignant neoplasm of kidney: Secondary | ICD-10-CM | POA: Insufficient documentation

## 2019-05-31 DIAGNOSIS — I130 Hypertensive heart and chronic kidney disease with heart failure and stage 1 through stage 4 chronic kidney disease, or unspecified chronic kidney disease: Secondary | ICD-10-CM | POA: Insufficient documentation

## 2019-05-31 DIAGNOSIS — E1122 Type 2 diabetes mellitus with diabetic chronic kidney disease: Secondary | ICD-10-CM | POA: Insufficient documentation

## 2019-05-31 DIAGNOSIS — Z86718 Personal history of other venous thrombosis and embolism: Secondary | ICD-10-CM | POA: Diagnosis not present

## 2019-05-31 DIAGNOSIS — I509 Heart failure, unspecified: Secondary | ICD-10-CM | POA: Diagnosis present

## 2019-05-31 NOTE — Patient Instructions (Addendum)
Continue weighing daily and call for an overnight weight gain of >2 pounds or a weekly weight gain of >5 pounds.  Bring your home BP cuff to your next visit (either with [place] or primary care) to check it with our machine.

## 2019-05-31 NOTE — Progress Notes (Signed)
Patient ID: Kristen Barron, female    DOB: 06-25-1929, 85 y.o.   MRN: [id number]  HPI  Kristen Barron is a 85 y/o female with a history of breast cancer, DM, HTN, CKD, stroke, DVT and chronic heart failure.   Echo report from 12/10/17 reviewed and showed an EF of 50-55%.  Was in the ED 02/09/2019 due to fall after syncopal episode. Head CT was negative and she was released.   She presents today for a follow-up visit with a chief complaint of shortness of breath with minimal exertion. This is associated with fatigue, leg swelling, back and leg pain, and waking up frequently at night. She states that her leg swelling has improved since increasing the torsemide to 60mg  daily. She denies chest pain, cough, palpitations, abdominal distention, and dizziness. She weighs herself every day and her weight has been stable. She also checks her blood pressure and this morning she stated it was systolic 0000000.  Past Medical History:  Diagnosis Date  . Arthritis   . Breast cancer ([place]) 03/06/2014   right breast with mastectomy and chemo  . Cancer [facility])    Reports lumpectomy for a cyst  . CHF (congestive heart failure) ([place])   . Collagen vascular disease ([place])   . Diabetes ([place])   . DVT (deep venous thrombosis) ([place])   . Hemorrhoids   . Hypertension   . Renal disorder   . Renal insufficiency   . Stroke [facility])    August 2014, but has had strokes prior as well   Past Surgical History:  Procedure Laterality Date  . ABDOMINAL HYSTERECTOMY     Patient not clear as to why  . BACK SURGERY    . BREAST BIOPSY Right February 15 2014   invasive mammary carcinoma/ER/PR negative, HER 2 positive  . BREAST BIOPSY Right 1999   negative biopsy  . BREAST SURGERY Right 03/06/14   mastectomy  . COLONOSCOPY WITH PROPOFOL N/A 04/15/2016   Procedure: COLONOSCOPY WITH PROPOFOL;  Surgeon: Manya Silvas, MD;  Location: [facility] ENDOSCOPY;  Service: Endoscopy;  Laterality: N/A;  . ESOPHAGOGASTRODUODENOSCOPY (EGD) WITH PROPOFOL N/A  04/15/2016   Procedure: ESOPHAGOGASTRODUODENOSCOPY (EGD) WITH PROPOFOL;  Surgeon: Manya Silvas, MD;  Location: [facility] ENDOSCOPY;  Service: Endoscopy;  Laterality: N/A;  . HAND SURGERY Right    Carpel tunnel release in the 1970s  . LUMBAR LAMINECTOMY/DECOMPRESSION MICRODISCECTOMY Bilateral 07/24/2013   Procedure: LUMBAR LAMINECTOMY/DECOMPRESSION MICRODISCECTOMY LUMBAR THREE-FOUR;  Surgeon: Charlie Pitter, MD;  Location: [place] NEURO ORS;  Service: Neurosurgery;  Laterality: Bilateral;  . MASTECTOMY Right 03/06/2014   right br ca. Dr Jamal Collin  . OOPHORECTOMY     Family History  Problem Relation Age of Onset  . CAD Father   . Cancer Sister 60       breast  . Breast cancer Sister 16  . Cancer Brother        kidney  . Cancer Maternal Aunt        breast  . Breast cancer Maternal Aunt 70  . Cancer Other        maternal niece with breast cancer  . Breast cancer Other   . Cancer Sister        pancreatic   Social History   Tobacco Use  . Smoking status: Never Smoker  . Smokeless tobacco: Never Used  Substance Use Topics  . Alcohol use: No   No Known Allergies  Prior to Admission medications   Medication Sig Start Date End Date Taking? Authorizing  Provider  benazepril (LOTENSIN) 40 MG tablet Take 1 tablet (40 mg total) by mouth daily. 12/12/17  Yes Gouru, Illene Silver, MD  cefUROXime (CEFTIN) 500 MG tablet Take 500 mg by mouth 2 (two) times daily with a meal.   Yes [provider]  clopidogrel (PLAVIX) 75 MG tablet Take 75 mg by mouth daily with breakfast.   Yes [provider]  diclofenac sodium (VOLTAREN) 1 % GEL Apply 2 g topically 4 (four) times daily.   Yes [provider]  gabapentin (NEURONTIN) 600 MG tablet Take 600 mg by mouth 4 (four) times daily.  11/25/16  Yes [provider]  Glucosamine 500 MG CAPS Take 500 mg by mouth 3 (three) times daily.   Yes [provider]  hydrALAZINE (APRESOLINE) 50 MG tablet Take 50 mg by mouth 3 (three) times  daily.    Yes [provider]  insulin aspart (NOVOLOG) 100 UNIT/ML injection Inject 4 Units into the skin 3 (three) times daily before meals.    Yes [provider]  insulin detemir (LEVEMIR) 100 UNIT/ML injection Inject 10 Units into the skin at bedtime.    Yes [provider]  lidocaine (XYLOCAINE) 5 % ointment Apply 1 application topically as needed.   Yes [provider]  loperamide (IMODIUM) 2 MG capsule Take by mouth as needed for diarrhea or loose stools.   Yes [provider]  magnesium oxide (MAG-OX) 400 MG tablet Take 400 mg by mouth 2 (two) times daily.   Yes [provider]  metoprolol succinate (TOPROL-XL) 50 MG 24 hr tablet Take 1 tablet (50 mg total) by mouth daily. Take with or immediately following a meal. 09/16/18  Yes Fritzi Mandes, MD  [facility] 5-325 MG tablet Take 1 tablet by mouth every 6 (six) hours as needed (back pain).  10/19/17  Yes [provider]  Omega 3 1200 MG CAPS Take 1 capsule by mouth 3 (three) times daily.    Yes [provider]  oxybutynin (DITROPAN-XL) 5 MG 24 hr tablet Take 5 mg by mouth at bedtime.   Yes [provider]  pantoprazole (PROTONIX) 40 MG tablet Take 40 mg by mouth daily.   Yes [provider]  pravastatin (PRAVACHOL) 80 MG tablet Take 80 mg by mouth every evening.    Yes [provider]  torsemide (DEMADEX) 20 MG tablet Take 60 mg by mouth daily.    Yes [provider]  traZODone (DESYREL) 50 MG tablet Take 50 mg by mouth at bedtime.   Yes [provider]  triamcinolone cream (KENALOG) 0.1 % Apply 1 application topically 2 (two) times daily.   Yes [provider]  vitamin B-12 (CYANOCOBALAMIN) 1000 MCG tablet Take 1,000 mcg by mouth daily.   Yes [provider]  cephALEXin (KEFLEX) 500 MG capsule Take 1 capsule (500 mg total) by mouth 2 (two) times daily. Patient not taking: Reported on 05/31/2019 02/09/19   Lavonia Drafts, MD    Review of Systems  Constitutional: Positive for fatigue. Negative for appetite change.  HENT: Positive for hearing loss. Negative for congestion, postnasal drip and sore throat.   Eyes: Negative.   Respiratory: Positive for shortness of breath (with minimal exertion). Negative for cough and chest tightness.   Cardiovascular: Positive for leg swelling. Negative for chest pain and palpitations.  Gastrointestinal: Negative for abdominal distention and abdominal pain.  Endocrine: Negative.   Genitourinary: Negative for dysuria and hematuria.  Musculoskeletal: Positive for arthralgias (legs) and back pain (right  lower back).  Skin: Negative.   Allergic/Immunologic: Negative.   Neurological: Negative for dizziness and light-headedness.  Hematological: Negative for adenopathy. Bruises/bleeds easily.  Psychiatric/Behavioral: Positive for sleep disturbance (waking up frequently). Negative for dysphoric mood. The patient is not nervous/anxious.    Vitals:   05/31/19 1321  BP: (!) 187/48  Pulse: 70  SpO2: 96%   Filed Weights   05/31/19 1321  Weight: 177 lb 3.2 oz (80.4 kg)   Lab Results  Component Value Date   CREATININE 1.13 (H) 05/05/2019   CREATININE 1.14 (H) 02/09/2019   CREATININE 1.23 (H) 09/18/2018    Physical Exam Vitals signs and nursing note reviewed.  Constitutional:      Appearance: She is well-developed.  HENT:     Head: Normocephalic and atraumatic.  Neck:     Musculoskeletal: Normal range of motion and neck supple.     Vascular: No JVD.  Cardiovascular:     Rate and Rhythm: Normal rate and regular rhythm.  Pulmonary:     Effort: Pulmonary effort is normal. No respiratory distress.     Breath sounds: Examination of the right-lower field reveals rales. Rales present. No wheezing.  Abdominal:     General: There is no distension.     Palpations: Abdomen is soft.  Musculoskeletal:     Right lower leg: Edema (1+ pitting) present.     Left lower leg:  Edema (1+ pitting) present.  Skin:    General: Skin is warm and dry.  Neurological:     Mental Status: She is alert and oriented to person, place, and time.  Psychiatric:        Behavior: Behavior normal.    Assessment & Plan:  1: Chronic heart failure with preserved ejection fraction- - NYHA class III - euvolemic today - weighing daily and she was instructed to call for an overnight weight gain of >2 pounds or a weekly weight gain of >5 pounds - weight stable from last visit 1 month ago - not adding salt but says that she has been trying to read the nutrition label. Reviewed the importance of reading food labels so that she can closely follow a 2000mg  sodium diet.  - BNP 05/05/2019 was 466.0 - continue current dose of torsemide. Will check BMP next visit if not check at any other future appointments.   2: HTN- - BP elevated today;manual repeat 160/52 - she states her blood pressure at home is [id number] systolic. Encouraged her to bring her blood pressure machine next visit here or with PCP to check against office machines.  - saw PCP Posey Pronto) 04/06/2019 - BMP 05/05/2019 reviewed and showed sodium 135, potassium 4.4 , creatinine 1.13 and GFR 44  3: Diabetes-  - fasting blood sugar 125 this morning at home - A1c 12/09/17 was 5.7%  4: Lymphedema- - stage 2 - limited in exercise due to instability and use of walker - encouraged her to elevate her legs when sitting for long periods of time - wearing compression socks daily with removal at bedtime - she says that the swelling doesn't go down much overnight - consider lymphapress compression boots if edema persists  Medication list was reviewed.  Return in 2 months or sooner for any questions/problems before then.

## 2019-06-01 ENCOUNTER — Encounter: Payer: Self-pay | Admitting: Family

## 2019-06-23 NOTE — Progress Notes (Signed)
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  INTERVAL HISTORY: Patient returns to clinic today for routine yearly evaluation.  She has some "burning" in her legs particularly on the top of her feet, but this does not affect her day-to-day activity.  Patient states she can continue to walk normally without falling.  She otherwise feels well. She has no neurologic complaints. She denies any recent fevers or illnesses.  She denies any other pain.  She has a good appetite and denies weight loss.  She denies any chest pain, shortness of breath, cough, or hemoptysis.  She denies any nausea, vomiting, constipation, or diarrhea. She has no urinary complaints.  Patient offers no further specific complaints today.  REVIEW OF SYSTEMS:   Review of Systems  Constitutional: Negative.  Negative for fever, malaise/fatigue and weight loss.  Respiratory: Negative.  Negative for cough and shortness of breath.   Cardiovascular: Negative.  Negative for chest pain and leg swelling.  Gastrointestinal: Negative.  Negative for abdominal pain.  Genitourinary: Negative.  Negative for dysuria.  Musculoskeletal: Positive for back pain and joint pain.  Skin: Negative.  Negative for rash.  Neurological: Positive for sensory change. Negative for focal weakness, weakness and headaches.  Psychiatric/Behavioral: Negative.  The patient is not nervous/anxious.     As per HPI. Otherwise, a complete review of systems is negative.  ONCOLOGY HISTORY: Oncology History Overview Note  1. Carcinoma of the right breast T1c N0 M0  tumor based on ultrasound and the breast mammogram. ER negative, PR negative, HER-2/neu positive (diagnosis in July of 2015). 2. Iron deficiency anemia with chronic renal disease. 3. Status post mastectomy (right) in August of 2015. Patient has stage IC, ER negative, PR negative, HER-2 receptor positive.  4. Patient started on chemotherapy with Taxol and Herceptin on April 09, 2014. 5. Because of progressive side effect Taxol was discontinued (August 28, 2014) Herceptin would be continued to July of 2016 6.  Because of swelling of lower extremity and shortness of breath Herceptin was discontinued in March of 2016.   Breast cancer of lower-outer quadrant of right female breast ([place])  02/28/2014 Initial Diagnosis   Breast cancer, right, invasive ductal     PAST MEDICAL HISTORY: Past Medical History:  Diagnosis Date  . Arthritis   . Breast cancer ([place]) 03/06/2014   right breast with mastectomy and chemo  . Cancer [facility])    Reports lumpectomy for a cyst  . CHF (congestive heart failure) ([place])   . Collagen vascular disease ([place])   . Diabetes ([place])   . DVT (deep venous thrombosis) ([place])   . Hemorrhoids   . Hypertension   . Renal disorder   . Renal insufficiency   . Stroke [facility])    August 2014, but has had strokes prior as well    PAST SURGICAL HISTORY: Past Surgical History:  Procedure Laterality Date  . ABDOMINAL HYSTERECTOMY     Patient not clear as to why  . BACK SURGERY    . BREAST BIOPSY Right February 15 2014   invasive mammary carcinoma/ER/PR negative, HER 2 positive  . BREAST BIOPSY Right 1999   negative  biopsy  . BREAST SURGERY Right 03/06/14   mastectomy  . COLONOSCOPY WITH PROPOFOL N/A 04/15/2016   Procedure: COLONOSCOPY WITH PROPOFOL;  Surgeon: Manya Silvas, MD;  Location: [facility] ENDOSCOPY;  Service: Endoscopy;  Laterality: N/A;  . ESOPHAGOGASTRODUODENOSCOPY (EGD) WITH PROPOFOL N/A 04/15/2016   Procedure: ESOPHAGOGASTRODUODENOSCOPY (EGD) WITH PROPOFOL;  Surgeon: Manya Silvas, MD;  Location: [facility] ENDOSCOPY;  Service: Endoscopy;  Laterality: N/A;  . HAND SURGERY Right    Carpel tunnel release in the 1970s  . LUMBAR LAMINECTOMY/DECOMPRESSION MICRODISCECTOMY Bilateral 07/24/2013   Procedure: LUMBAR LAMINECTOMY/DECOMPRESSION MICRODISCECTOMY LUMBAR THREE-FOUR;  Surgeon: Charlie Pitter, MD;  Location: [place] NEURO ORS;  Service: Neurosurgery;  Laterality: Bilateral;  . MASTECTOMY Right 03/06/2014   right br ca. Dr Jamal Collin  . OOPHORECTOMY      FAMILY HISTORY Family History  Problem Relation Age of Onset  . CAD Father   . Cancer Sister 35       breast  . Breast cancer Sister 1  . Cancer Brother        kidney  . Cancer Maternal Aunt        breast  . Breast cancer Maternal Aunt 70  . Cancer Other        maternal niece with breast cancer  . Breast cancer Other   . Cancer Sister        pancreatic    GYNECOLOGIC HISTORY:  No LMP recorded. Patient has had a hysterectomy.     ADVANCED DIRECTIVES:    HEALTH MAINTENANCE: Social History   Tobacco Use  . Smoking status: Never Smoker  . Smokeless tobacco: Never Used  Substance Use Topics  . Alcohol use: No  . Drug use: No    No Known Allergies  Current Outpatient Medications  Medication Sig Dispense Refill  . benazepril (LOTENSIN) 40 MG tablet Take 1 tablet (40 mg total) by mouth daily. 30 tablet 0  . cefUROXime (CEFTIN) 500 MG tablet Take 500 mg by mouth 2 (two) times daily with a meal.    . clopidogrel (PLAVIX) 75 MG tablet Take 75 mg by mouth daily with breakfast.    . diclofenac sodium (VOLTAREN) 1 % GEL Apply 2 g topically 4 (four) times daily.    Marland Kitchen gabapentin (NEURONTIN) 600 MG tablet Take 600 mg by mouth 4 (four) times daily.     . Glucosamine 500 MG CAPS Take 500 mg by mouth 3 (three) times daily.    . hydrALAZINE (APRESOLINE) 50 MG tablet Take 50 mg by mouth 3 (three) times daily.     . insulin aspart (NOVOLOG) 100 UNIT/ML injection Inject 4 Units into the skin 3 (three) times daily before  meals.     . insulin detemir (LEVEMIR) 100 UNIT/ML injection Inject 10 Units into the skin at bedtime.     . lidocaine (XYLOCAINE) 5 % ointment Apply 1 application topically as needed.    . loperamide (IMODIUM) 2 MG capsule Take by mouth as needed for diarrhea or loose stools.    . magnesium oxide (MAG-OX) 400 MG tablet Take 400 mg by mouth 2 (two) times daily.    . metoprolol succinate (TOPROL-XL) 50 MG 24 hr tablet Take 1 tablet (50 mg total) by mouth daily. Take with or immediately following a meal. 30 tablet 1  . NORCO 5-325 MG tablet Take 1 tablet by mouth every 6 (six) hours as needed (back pain).     . Omega 3 1200 MG CAPS Take 1 capsule by mouth 3 (three) times  daily.     . oxybutynin (DITROPAN-XL) 5 MG 24 hr tablet Take 5 mg by mouth at bedtime.    . pantoprazole (PROTONIX) 40 MG tablet Take 40 mg by mouth daily.    . pravastatin (PRAVACHOL) 80 MG tablet Take 80 mg by mouth every evening.     . torsemide (DEMADEX) 20 MG tablet Take 60 mg by mouth daily.     . traZODone (DESYREL) 50 MG tablet Take 50 mg by mouth at bedtime.    . triamcinolone cream (KENALOG) 0.1 % Apply 1 application topically 2 (two) times daily.    . vitamin B-12 (CYANOCOBALAMIN) 1000 MCG tablet Take 1,000 mcg by mouth daily.    . cephALEXin (KEFLEX) 500 MG capsule Take 1 capsule (500 mg total) by mouth 2 (two) times daily. (Patient not taking: Reported on 05/31/2019) 14 capsule 0   No current facility-administered medications for this visit.    Facility-Administered Medications Ordered in Other Visits  Medication Dose Route Frequency Provider Last Rate Last Dose  . heparin lock flush 100 unit/mL  500 Units Intravenous Once Lloyd Huger, MD      . sodium chloride flush (NS) 0.9 % injection 10 mL  10 mL Intravenous PRN Lloyd Huger, MD   10 mL at 09/23/16 1103  . sodium chloride flush (NS) 0.9 % injection 10 mL  10 mL Intravenous PRN Grayland Ormond, Kathlene November, MD        OBJECTIVE: BP (!) 161/52 (BP  Location: Right Arm, Patient Position: Sitting)   Pulse 64   Temp 98.7 F (37.1 C) (Tympanic)   Resp 16   Wt 172 lb 1.6 oz (78.1 kg)   SpO2 97%   BMI 32.52 kg/m    Body mass index is 32.52 kg/m.    ECOG FS:1 - Symptomatic but completely ambulatory  General: Well-developed, well-nourished, no acute distress. Eyes: Pink conjunctiva, anicteric sclera. HEENT: Normocephalic, moist mucous membranes. Breast: Patient declined exam today. Lungs: Clear to auscultation bilaterally. Heart: Regular rate and rhythm. No rubs, murmurs, or gallops. Abdomen: Soft, nontender, nondistended. No organomegaly noted, normoactive bowel sounds. Musculoskeletal: No edema, cyanosis, or clubbing. Neuro: Alert, answering all questions appropriately. Cranial nerves grossly intact. Skin: No rashes or petechiae noted. Psych: Normal affect.  LAB RESULTS:  No visits with results within 3 Day(s) from this visit.  Latest known visit with results is:  Admission on 02/09/2019, Discharged on 02/09/2019  Component Date Value Ref Range Status  . Sodium 02/09/2019 135  135 - 145 mmol/L Final  . Potassium 02/09/2019 5.0  3.5 - 5.1 mmol/L Final  . Chloride 02/09/2019 105  98 - 111 mmol/L Final  . CO2 02/09/2019 21* 22 - 32 mmol/L Final  . Glucose, Bld 02/09/2019 146* 70 - 99 mg/dL Final  . BUN 02/09/2019 16  8 - 23 mg/dL Final  . Creatinine, Ser 02/09/2019 1.14* 0.44 - 1.00 mg/dL Final  . Calcium 02/09/2019 8.4* 8.9 - 10.3 mg/dL Final  . GFR calc non Af Amer 02/09/2019 43* >60 mL/min Final  . GFR calc Af Amer 02/09/2019 50* >60 mL/min Final  . Anion gap 02/09/2019 9  5 - 15 Final   Performed at [facility], [address], [place], [place] [address]  . WBC 02/09/2019 6.5  4.0 - 10.5 K/uL Final  . RBC 02/09/2019 3.19* 3.87 - 5.11 MIL/uL Final  . Hemoglobin 02/09/2019 9.3* 12.0 - 15.0 g/dL Final  . HCT 02/09/2019 30.0* 36.0 - 46.0 % Final  . MCV 02/09/2019  94.0  80.0 - 100.0 fL Final  . MCH 02/09/2019 29.2   26.0 - 34.0 pg Final  . MCHC 02/09/2019 31.0  30.0 - 36.0 g/dL Final  . RDW 02/09/2019 14.7  11.5 - 15.5 % Final  . Platelets 02/09/2019 261  150 - 400 K/uL Final  . nRBC 02/09/2019 0.0  0.0 - 0.2 % Final   Performed at [facility], [address] [place], [place] [address]  . Color, Urine 02/09/2019 YELLOW  YELLOW Final  . APPearance 02/09/2019 HAZY* CLEAR Final  . Specific Gravity, Urine 02/09/2019 1.008  1.005 - 1.030 Final  . pH 02/09/2019 5.0  5.0 - 8.0 Final  . Glucose, UA 02/09/2019 NEGATIVE  NEGATIVE mg/dL Final  . Hgb urine dipstick 02/09/2019 NEGATIVE  NEGATIVE Final  . Bilirubin Urine 02/09/2019 NEGATIVE  NEGATIVE Final  . Ketones, ur 02/09/2019 NEGATIVE  NEGATIVE mg/dL Final  . Protein, ur 02/09/2019 100* NEGATIVE mg/dL Final  . Nitrite 02/09/2019 POSITIVE* NEGATIVE Final  . Leukocytes,Ua 02/09/2019 MODERATE* NEGATIVE Final  . Squamous Epithelial / LPF 02/09/2019 NONE SEEN  0 - 5 Final  . WBC, UA 02/09/2019 >50  0 - 5 WBC/hpf Final  . RBC / HPF 02/09/2019 0-5  0 - 5 RBC/hpf Final  . Bacteria, UA 02/09/2019 FEW* NONE SEEN Final  . WBC Clumps 02/09/2019 PRESENT   Final  . Amorphous Crystal 02/09/2019 PRESENT   Final   Performed at [facility], [address], [place], [place] [address]  . Total Protein 02/09/2019 6.8  6.5 - 8.1 g/dL Final  . Albumin 02/09/2019 3.4* 3.5 - 5.0 g/dL Final  . AST 02/09/2019 18  15 - 41 U/L Final  . ALT 02/09/2019 9  0 - 44 U/L Final  . Alkaline Phosphatase 02/09/2019 67  38 - 126 U/L Final  . Total Bilirubin 02/09/2019 0.5  0.3 - 1.2 mg/dL Final  . Bilirubin, Direct 02/09/2019 0.1  0.0 - 0.2 mg/dL Final  . Indirect Bilirubin 02/09/2019 0.4  0.3 - 0.9 mg/dL Final   Performed at [facility], [address] [place], [place] [address]  . Lipase 02/09/2019 24  11 - 51 U/L Final   Performed at [facility], [address] [place], [place] [address]    STUDIES: No results found.  ASSESSMENT:  Stage  Ia, T1cN0M0, ER/PR negative HER-2 positive adenoarcinoma of the lower outer quadrant of the right breast.  PLAN:      1. Stage Ia ER/PR negative, HER-2 positive adenoarcinoma of the lower outer quadrant of the right breast:  No evidence of recurrent disease. Patient is status post right mastectomy on March 06, 2014.  Patient received Taxol and Herceptin between August 2015 and to January 2016. Taxol was discontinued secondary to side effects. Herceptin was also subsequently discontinued in March 2016.  Patient's most recent mammogram on April 10, 2019 was reported as BI-RADS 2.  Repeat in August 2021.  Return to clinic in 1 year for routine evaluation.   2.  Leg burning: Unclear etiology.  Pattern not consistent with a peripheral neuropathy, continue monitoring and evaluation per primary care.  I spent a total of 15 minutes face-to-face with the patient of which greater than 50% of the visit was spent in counseling and coordination of care as detailed above.   Patient expressed understanding and was in agreement with this plan. She also understands that She can call clinic at any time with any questions, concerns, or complaints.    Breast  cancer, right, invasive ductal   Staging form: Breast, AJCC 7th Edition     Clinical: Stage IA (T1c, N0, M0) - Unsigned   Lloyd Huger, MD   06/27/2019 2:19 PM

## 2019-06-27 ENCOUNTER — Encounter: Payer: Self-pay | Admitting: Oncology

## 2019-06-27 ENCOUNTER — Inpatient Hospital Stay: Payer: Medicare HMO | Attending: Oncology | Admitting: Oncology

## 2019-06-27 ENCOUNTER — Other Ambulatory Visit: Payer: Self-pay

## 2019-06-27 VITALS — BP 161/52 | HR 64 | Temp 98.7°F | Resp 16 | Wt 172.1 lb

## 2019-06-27 DIAGNOSIS — E119 Type 2 diabetes mellitus without complications: Secondary | ICD-10-CM | POA: Diagnosis not present

## 2019-06-27 DIAGNOSIS — Z90710 Acquired absence of both cervix and uterus: Secondary | ICD-10-CM | POA: Insufficient documentation

## 2019-06-27 DIAGNOSIS — Z86711 Personal history of pulmonary embolism: Secondary | ICD-10-CM | POA: Insufficient documentation

## 2019-06-27 DIAGNOSIS — Z803 Family history of malignant neoplasm of breast: Secondary | ICD-10-CM | POA: Diagnosis not present

## 2019-06-27 DIAGNOSIS — Z800 Family history of malignant neoplasm of digestive organs: Secondary | ICD-10-CM | POA: Insufficient documentation

## 2019-06-27 DIAGNOSIS — Z171 Estrogen receptor negative status [ER-]: Secondary | ICD-10-CM | POA: Diagnosis not present

## 2019-06-27 DIAGNOSIS — C50511 Malignant neoplasm of lower-outer quadrant of right female breast: Secondary | ICD-10-CM

## 2019-06-27 DIAGNOSIS — Z8673 Personal history of transient ischemic attack (TIA), and cerebral infarction without residual deficits: Secondary | ICD-10-CM | POA: Diagnosis not present

## 2019-06-27 DIAGNOSIS — Z9221 Personal history of antineoplastic chemotherapy: Secondary | ICD-10-CM | POA: Insufficient documentation

## 2019-06-27 DIAGNOSIS — Z9011 Acquired absence of right breast and nipple: Secondary | ICD-10-CM | POA: Diagnosis not present

## 2019-06-27 DIAGNOSIS — Z853 Personal history of malignant neoplasm of breast: Secondary | ICD-10-CM | POA: Diagnosis not present

## 2019-06-27 DIAGNOSIS — R208 Other disturbances of skin sensation: Secondary | ICD-10-CM | POA: Diagnosis not present

## 2019-06-27 DIAGNOSIS — I10 Essential (primary) hypertension: Secondary | ICD-10-CM | POA: Insufficient documentation

## 2019-07-30 NOTE — Progress Notes (Signed)
Patient ID: Kristen Barron, female    DOB: Oct 07, 1928, 85 y.o.   MRN: [id number]  HPI  Kristen Barron is a 85 y/o female with a history of breast cancer, DM, HTN, CKD, stroke, DVT and chronic heart failure.   Echo report from 12/10/17 reviewed and showed an EF of 50-55%.  Was in the ED 02/09/2019 due to fall after syncopal episode. Head CT was negative and she was released.   She presents today for a follow-up visit with a chief complaint of moderate shortness of breath upon minimal exertion. She describes this as chronic in nature having been present for several years. She has associated fatigue, pedal edema, difficulty sleeping and chronic leg pain along with this. She denies any abdominal distention, palpitations, chest pain, dizziness, cough or weight gain.   She says that she's had a decreased appetite for many years.   Past Medical History:  Diagnosis Date  . Arthritis   . Breast cancer ([place]) 03/06/2014   right breast with mastectomy and chemo  . Cancer [facility])    Reports lumpectomy for a cyst  . CHF (congestive heart failure) ([place])   . Collagen vascular disease ([place])   . Diabetes ([place])   . DVT (deep venous thrombosis) ([place])   . Hemorrhoids   . Hypertension   . Renal disorder   . Renal insufficiency   . Stroke [facility])    August 2014, but has had strokes prior as well   Past Surgical History:  Procedure Laterality Date  . ABDOMINAL HYSTERECTOMY     Patient not clear as to why  . BACK SURGERY    . BREAST BIOPSY Right February 15 2014   invasive mammary carcinoma/ER/PR negative, HER 2 positive  . BREAST BIOPSY Right 1999   negative biopsy  . BREAST SURGERY Right 03/06/14   mastectomy  . COLONOSCOPY WITH PROPOFOL N/A 04/15/2016   Procedure: COLONOSCOPY WITH PROPOFOL;  Surgeon: Manya Silvas, MD;  Location: [facility] ENDOSCOPY;  Service: Endoscopy;  Laterality: N/A;  . ESOPHAGOGASTRODUODENOSCOPY (EGD) WITH PROPOFOL N/A 04/15/2016   Procedure: ESOPHAGOGASTRODUODENOSCOPY (EGD) WITH PROPOFOL;   Surgeon: Manya Silvas, MD;  Location: [facility] ENDOSCOPY;  Service: Endoscopy;  Laterality: N/A;  . HAND SURGERY Right    Carpel tunnel release in the 1970s  . LUMBAR LAMINECTOMY/DECOMPRESSION MICRODISCECTOMY Bilateral 07/24/2013   Procedure: LUMBAR LAMINECTOMY/DECOMPRESSION MICRODISCECTOMY LUMBAR THREE-FOUR;  Surgeon: Charlie Pitter, MD;  Location: [place] NEURO ORS;  Service: Neurosurgery;  Laterality: Bilateral;  . MASTECTOMY Right 03/06/2014   right br ca. Dr Jamal Collin  . OOPHORECTOMY     Family History  Problem Relation Age of Onset  . CAD Father   . Cancer Sister 28       breast  . Breast cancer Sister 24  . Cancer Brother        kidney  . Cancer Maternal Aunt        breast  . Breast cancer Maternal Aunt 70  . Cancer Other        maternal niece with breast cancer  . Breast cancer Other   . Cancer Sister        pancreatic   Social History   Tobacco Use  . Smoking status: Never Smoker  . Smokeless tobacco: Never Used  Substance Use Topics  . Alcohol use: No   No Known Allergies  Prior to Admission medications   Medication Sig Start Date End Date Taking? Authorizing Provider  benazepril (LOTENSIN) 40 MG tablet Take 1 tablet (40 mg  total) by mouth daily. 12/12/17  Yes Gouru, Illene Silver, MD  clopidogrel (PLAVIX) 75 MG tablet Take 75 mg by mouth daily with breakfast.   Yes [provider]  diclofenac sodium (VOLTAREN) 1 % GEL Apply 2 g topically 4 (four) times daily.   Yes [provider]  gabapentin (NEURONTIN) 600 MG tablet Take 600 mg by mouth 4 (four) times daily.  11/25/16  Yes [provider]  Glucosamine 500 MG CAPS Take 500 mg by mouth 3 (three) times daily.   Yes [provider]  hydrALAZINE (APRESOLINE) 50 MG tablet Take 50 mg by mouth 3 (three) times daily.    Yes [provider]  insulin aspart (NOVOLOG) 100 UNIT/ML injection Inject 4 Units into the skin 3 (three) times daily before meals.    Yes [provider]  insulin  detemir (LEVEMIR) 100 UNIT/ML injection Inject 10 Units into the skin at bedtime.    Yes [provider]  lidocaine (XYLOCAINE) 5 % ointment Apply 1 application topically as needed.   Yes [provider]  loperamide (IMODIUM) 2 MG capsule Take by mouth as needed for diarrhea or loose stools.   Yes [provider]  magnesium oxide (MAG-OX) 400 MG tablet Take 400 mg by mouth 2 (two) times daily.   Yes [provider]  metoprolol succinate (TOPROL-XL) 50 MG 24 hr tablet Take 1 tablet (50 mg total) by mouth daily. Take with or immediately following a meal. 09/16/18  Yes Fritzi Mandes, MD  [facility] 5-325 MG tablet Take 1 tablet by mouth every 6 (six) hours as needed (back pain).  10/19/17  Yes [provider]  Omega 3 1200 MG CAPS Take 1 capsule by mouth 3 (three) times daily.    Yes [provider]  oxybutynin (DITROPAN-XL) 5 MG 24 hr tablet Take 5 mg by mouth at bedtime.   Yes [provider]  pantoprazole (PROTONIX) 40 MG tablet Take 40 mg by mouth daily.   Yes [provider]  pravastatin (PRAVACHOL) 80 MG tablet Take 80 mg by mouth every evening.    Yes [provider]  torsemide (DEMADEX) 20 MG tablet Take 60 mg by mouth daily.    Yes [provider]  traZODone (DESYREL) 50 MG tablet Take 50 mg by mouth at bedtime.   Yes [provider]  triamcinolone cream (KENALOG) 0.1 % Apply 1 application topically 2 (two) times daily.   Yes [provider]  vitamin B-12 (CYANOCOBALAMIN) 1000 MCG tablet Take 1,000 mcg by mouth daily.   Yes [provider]     Review of Systems  Constitutional: Positive for fatigue. Negative for appetite change.  HENT: Positive for hearing loss. Negative for congestion, postnasal drip and sore throat.   Eyes: Negative.   Respiratory: Positive for shortness of breath (with minimal exertion). Negative for cough and chest tightness.   Cardiovascular: Positive for leg  swelling. Negative for chest pain and palpitations.  Gastrointestinal: Negative for abdominal distention and abdominal pain.  Endocrine: Negative.   Genitourinary: Negative for dysuria and hematuria.  Musculoskeletal: Positive for arthralgias (both knees down to feet) and back pain (right lower back).  Skin: Negative.   Allergic/Immunologic: Negative.   Neurological: Negative for dizziness and light-headedness.  Hematological: Negative for adenopathy. Bruises/bleeds easily.  Psychiatric/Behavioral: Positive for sleep disturbance (waking up frequently). Negative for dysphoric mood. The patient is not nervous/anxious.    Vitals:   07/31/19 1327  BP: (!) 149/42  Pulse: 73  Resp: 15  SpO2: 100%  Weight: 163 lb 3.2 oz (74 kg)  Height: 5\' 1"  (1.549 m)   Wt Readings from Last 3 Encounters:  07/31/19 163 lb 3.2 oz (74 kg)  06/27/19 172 lb 1.6 oz (78.1 kg)  05/31/19 177 lb 3.2 oz (80.4 kg)   Lab Results  Component Value Date   CREATININE 1.13 (H) 05/05/2019   CREATININE 1.14 (H) 02/09/2019   CREATININE 1.23 (H) 09/18/2018    Physical Exam Vitals and nursing note reviewed.  Constitutional:      Appearance: She is well-developed.  HENT:     Head: Normocephalic and atraumatic.  Neck:     Vascular: No JVD.  Cardiovascular:     Rate and Rhythm: Normal rate and regular rhythm.  Pulmonary:     Effort: Pulmonary effort is normal. No respiratory distress.     Breath sounds: No wheezing or rales.  Abdominal:     General: There is no distension.     Palpations: Abdomen is soft.  Musculoskeletal:     Cervical back: Normal range of motion and neck supple.     Right lower leg: Edema (1+ pitting) present.     Left lower leg: Edema (1+ pitting) present.  Skin:    General: Skin is warm and dry.  Neurological:     Mental Status: She is alert and oriented to person, place, and time.  Psychiatric:        Behavior: Behavior normal.    Assessment & Plan:  1: Chronic heart failure with  preserved ejection fraction- - NYHA class III - euvolemic today - weighing daily and she was instructed to call for an overnight weight gain of >2 pounds or a weekly weight gain of >5 pounds - weight down 14 pounds from last visit 2 months ago - not adding salt but says that she has been trying to read the nutrition label. Reviewed the importance of reading food labels so that she can closely follow a 2000mg  sodium diet.  - BNP 05/05/2019 was 466.0 - patient says that she's received her flu vaccine for this season   2: HTN- - BP looks good today - saw PCP Posey Pronto) 04/06/2019; says that there has been discussion about her going into the [facility] - BMP 05/05/2019 reviewed and showed sodium 135, potassium 4.4 , creatinine 1.13 and GFR 44  3: Diabetes-  - fasting blood sugar at home today was 108 - A1c 12/09/17 was 5.7%  4: Lymphedema- - stage 2 - limited in exercise due to instability and use of walker - says that she's not been elevating her legs much and she was encouraged to elevate them when she's sitting for long periods of time - wearing compression socks daily with removal at bedtime - she says that the swelling doesn't go down much overnight - consider lymphapress compression boots if edema persists  Medication list was reviewed.  Return in 6 months or sooner for any questions/problems before then. Explained that if she goes into the [facility] program, that we would probably not see her anymore since they coordinate all care.

## 2019-07-31 ENCOUNTER — Encounter: Payer: Self-pay | Admitting: Family

## 2019-07-31 ENCOUNTER — Other Ambulatory Visit: Payer: Self-pay

## 2019-07-31 ENCOUNTER — Ambulatory Visit: Payer: Medicare HMO | Attending: Family | Admitting: Family

## 2019-07-31 VITALS — BP 149/42 | HR 73 | Resp 15 | Ht 61.0 in | Wt 163.2 lb

## 2019-07-31 DIAGNOSIS — N189 Chronic kidney disease, unspecified: Secondary | ICD-10-CM | POA: Insufficient documentation

## 2019-07-31 DIAGNOSIS — E1122 Type 2 diabetes mellitus with diabetic chronic kidney disease: Secondary | ICD-10-CM | POA: Diagnosis not present

## 2019-07-31 DIAGNOSIS — I890 Lymphedema, not elsewhere classified: Secondary | ICD-10-CM | POA: Diagnosis not present

## 2019-07-31 DIAGNOSIS — M79606 Pain in leg, unspecified: Secondary | ICD-10-CM | POA: Diagnosis not present

## 2019-07-31 DIAGNOSIS — Z803 Family history of malignant neoplasm of breast: Secondary | ICD-10-CM | POA: Diagnosis not present

## 2019-07-31 DIAGNOSIS — Z9221 Personal history of antineoplastic chemotherapy: Secondary | ICD-10-CM | POA: Insufficient documentation

## 2019-07-31 DIAGNOSIS — Z86718 Personal history of other venous thrombosis and embolism: Secondary | ICD-10-CM | POA: Insufficient documentation

## 2019-07-31 DIAGNOSIS — E119 Type 2 diabetes mellitus without complications: Secondary | ICD-10-CM

## 2019-07-31 DIAGNOSIS — G8929 Other chronic pain: Secondary | ICD-10-CM | POA: Insufficient documentation

## 2019-07-31 DIAGNOSIS — Z8249 Family history of ischemic heart disease and other diseases of the circulatory system: Secondary | ICD-10-CM | POA: Diagnosis not present

## 2019-07-31 DIAGNOSIS — Z8673 Personal history of transient ischemic attack (TIA), and cerebral infarction without residual deficits: Secondary | ICD-10-CM | POA: Insufficient documentation

## 2019-07-31 DIAGNOSIS — Z791 Long term (current) use of non-steroidal anti-inflammatories (NSAID): Secondary | ICD-10-CM | POA: Insufficient documentation

## 2019-07-31 DIAGNOSIS — Z9011 Acquired absence of right breast and nipple: Secondary | ICD-10-CM | POA: Diagnosis not present

## 2019-07-31 DIAGNOSIS — I5032 Chronic diastolic (congestive) heart failure: Secondary | ICD-10-CM | POA: Diagnosis present

## 2019-07-31 DIAGNOSIS — I10 Essential (primary) hypertension: Secondary | ICD-10-CM

## 2019-07-31 DIAGNOSIS — Z853 Personal history of malignant neoplasm of breast: Secondary | ICD-10-CM | POA: Diagnosis not present

## 2019-07-31 DIAGNOSIS — I130 Hypertensive heart and chronic kidney disease with heart failure and stage 1 through stage 4 chronic kidney disease, or unspecified chronic kidney disease: Secondary | ICD-10-CM | POA: Insufficient documentation

## 2019-07-31 DIAGNOSIS — Z79899 Other long term (current) drug therapy: Secondary | ICD-10-CM | POA: Diagnosis not present

## 2019-07-31 DIAGNOSIS — Z794 Long term (current) use of insulin: Secondary | ICD-10-CM | POA: Insufficient documentation

## 2019-07-31 NOTE — Patient Instructions (Addendum)
Begin weighing daily and call for an overnight weight gain of > 2 pounds or a weekly weight gain of >5 pounds. 

## 2020-01-17 ENCOUNTER — Other Ambulatory Visit: Payer: Self-pay | Admitting: Acute Care

## 2020-01-17 ENCOUNTER — Other Ambulatory Visit (HOSPITAL_COMMUNITY): Payer: Self-pay | Admitting: Acute Care

## 2020-01-17 DIAGNOSIS — R937 Abnormal findings on diagnostic imaging of other parts of musculoskeletal system: Secondary | ICD-10-CM

## 2020-01-24 ENCOUNTER — Other Ambulatory Visit: Payer: Self-pay

## 2020-01-24 ENCOUNTER — Observation Stay
Admission: EM | Admit: 2020-01-24 | Discharge: 2020-01-25 | Disposition: A | Discharge status: Home / Self Care | Payer: Medicare HMO | Attending: Internal Medicine | Admitting: Internal Medicine

## 2020-01-24 ENCOUNTER — Observation Stay: Payer: Medicare HMO

## 2020-01-24 ENCOUNTER — Emergency Department: Payer: Medicare HMO

## 2020-01-24 DIAGNOSIS — S0990XA Unspecified injury of head, initial encounter: Secondary | ICD-10-CM | POA: Diagnosis not present

## 2020-01-24 DIAGNOSIS — I890 Lymphedema, not elsewhere classified: Secondary | ICD-10-CM | POA: Diagnosis not present

## 2020-01-24 DIAGNOSIS — R531 Weakness: Secondary | ICD-10-CM | POA: Insufficient documentation

## 2020-01-24 DIAGNOSIS — Z791 Long term (current) use of non-steroidal anti-inflammatories (NSAID): Secondary | ICD-10-CM | POA: Diagnosis not present

## 2020-01-24 DIAGNOSIS — Z8249 Family history of ischemic heart disease and other diseases of the circulatory system: Secondary | ICD-10-CM | POA: Insufficient documentation

## 2020-01-24 DIAGNOSIS — Z87440 Personal history of urinary (tract) infections: Secondary | ICD-10-CM | POA: Insufficient documentation

## 2020-01-24 DIAGNOSIS — Z79899 Other long term (current) drug therapy: Secondary | ICD-10-CM | POA: Insufficient documentation

## 2020-01-24 DIAGNOSIS — Z90710 Acquired absence of both cervix and uterus: Secondary | ICD-10-CM | POA: Diagnosis not present

## 2020-01-24 DIAGNOSIS — Z794 Long term (current) use of insulin: Secondary | ICD-10-CM | POA: Diagnosis not present

## 2020-01-24 DIAGNOSIS — N179 Acute kidney failure, unspecified: Secondary | ICD-10-CM

## 2020-01-24 DIAGNOSIS — N183 Chronic kidney disease, stage 3 unspecified: Secondary | ICD-10-CM | POA: Insufficient documentation

## 2020-01-24 DIAGNOSIS — Z86718 Personal history of other venous thrombosis and embolism: Secondary | ICD-10-CM | POA: Insufficient documentation

## 2020-01-24 DIAGNOSIS — Z853 Personal history of malignant neoplasm of breast: Secondary | ICD-10-CM | POA: Insufficient documentation

## 2020-01-24 DIAGNOSIS — E1122 Type 2 diabetes mellitus with diabetic chronic kidney disease: Secondary | ICD-10-CM | POA: Insufficient documentation

## 2020-01-24 DIAGNOSIS — N3000 Acute cystitis without hematuria: Secondary | ICD-10-CM | POA: Diagnosis not present

## 2020-01-24 DIAGNOSIS — I5032 Chronic diastolic (congestive) heart failure: Secondary | ICD-10-CM | POA: Insufficient documentation

## 2020-01-24 DIAGNOSIS — N281 Cyst of kidney, acquired: Secondary | ICD-10-CM | POA: Insufficient documentation

## 2020-01-24 DIAGNOSIS — W010XXA Fall on same level from slipping, tripping and stumbling without subsequent striking against object, initial encounter: Secondary | ICD-10-CM | POA: Insufficient documentation

## 2020-01-24 DIAGNOSIS — Z7901 Long term (current) use of anticoagulants: Secondary | ICD-10-CM | POA: Diagnosis not present

## 2020-01-24 DIAGNOSIS — W19XXXA Unspecified fall, initial encounter: Secondary | ICD-10-CM | POA: Diagnosis not present

## 2020-01-24 DIAGNOSIS — M1990 Unspecified osteoarthritis, unspecified site: Secondary | ICD-10-CM | POA: Diagnosis not present

## 2020-01-24 DIAGNOSIS — N390 Urinary tract infection, site not specified: Secondary | ICD-10-CM | POA: Diagnosis present

## 2020-01-24 DIAGNOSIS — Z7902 Long term (current) use of antithrombotics/antiplatelets: Secondary | ICD-10-CM | POA: Diagnosis not present

## 2020-01-24 DIAGNOSIS — Z8051 Family history of malignant neoplasm of kidney: Secondary | ICD-10-CM | POA: Insufficient documentation

## 2020-01-24 DIAGNOSIS — E872 Acidosis: Secondary | ICD-10-CM | POA: Insufficient documentation

## 2020-01-24 DIAGNOSIS — Z8673 Personal history of transient ischemic attack (TIA), and cerebral infarction without residual deficits: Secondary | ICD-10-CM | POA: Insufficient documentation

## 2020-01-24 DIAGNOSIS — M359 Systemic involvement of connective tissue, unspecified: Secondary | ICD-10-CM | POA: Insufficient documentation

## 2020-01-24 DIAGNOSIS — I10 Essential (primary) hypertension: Secondary | ICD-10-CM | POA: Diagnosis not present

## 2020-01-24 DIAGNOSIS — Z800 Family history of malignant neoplasm of digestive organs: Secondary | ICD-10-CM | POA: Insufficient documentation

## 2020-01-24 DIAGNOSIS — Z20822 Contact with and (suspected) exposure to covid-19: Secondary | ICD-10-CM | POA: Diagnosis not present

## 2020-01-24 DIAGNOSIS — I130 Hypertensive heart and chronic kidney disease with heart failure and stage 1 through stage 4 chronic kidney disease, or unspecified chronic kidney disease: Secondary | ICD-10-CM | POA: Insufficient documentation

## 2020-01-24 DIAGNOSIS — Z803 Family history of malignant neoplasm of breast: Secondary | ICD-10-CM | POA: Insufficient documentation

## 2020-01-24 DIAGNOSIS — M961 Postlaminectomy syndrome, not elsewhere classified: Secondary | ICD-10-CM | POA: Diagnosis not present

## 2020-01-24 DIAGNOSIS — Z90721 Acquired absence of ovaries, unilateral: Secondary | ICD-10-CM | POA: Insufficient documentation

## 2020-01-24 LAB — CBC WITH DIFFERENTIAL/PLATELET
Abs Immature Granulocytes: 0.07 10*3/uL (ref 0.00–0.07)
Basophils Absolute: 0 10*3/uL (ref 0.0–0.1)
Basophils Relative: 1 %
Eosinophils Absolute: 0.1 10*3/uL (ref 0.0–0.5)
Eosinophils Relative: 1 %
HCT: 27.2 % — ABNORMAL LOW (ref 36.0–46.0)
Hemoglobin: 9.1 g/dL — ABNORMAL LOW (ref 12.0–15.0)
Immature Granulocytes: 1 %
Lymphocytes Relative: 8 %
Lymphs Abs: 0.7 10*3/uL (ref 0.7–4.0)
MCH: 30.2 pg (ref 26.0–34.0)
MCHC: 33.5 g/dL (ref 30.0–36.0)
MCV: 90.4 fL (ref 80.0–100.0)
Monocytes Absolute: 0.6 10*3/uL (ref 0.1–1.0)
Monocytes Relative: 7 %
Neutro Abs: 6.8 10*3/uL (ref 1.7–7.7)
Neutrophils Relative %: 82 %
Platelets: 276 10*3/uL (ref 150–400)
RBC: 3.01 MIL/uL — ABNORMAL LOW (ref 3.87–5.11)
RDW: 13.9 % (ref 11.5–15.5)
WBC: 8.3 10*3/uL (ref 4.0–10.5)
nRBC: 0 % (ref 0.0–0.2)

## 2020-01-24 LAB — URINALYSIS, COMPLETE (UACMP) WITH MICROSCOPIC
Bilirubin Urine: NEGATIVE
Glucose, UA: NEGATIVE mg/dL
Ketones, ur: NEGATIVE mg/dL
Nitrite: NEGATIVE
Protein, ur: >300 mg/dL — AB
Specific Gravity, Urine: 1.02 (ref 1.005–1.030)
WBC, UA: >50 WBC/hpf (ref 0–5)
pH: 7 (ref 5.0–8.0)

## 2020-01-24 LAB — COMPREHENSIVE METABOLIC PANEL
ALT: 12 U/L (ref 0–44)
AST: 18 U/L (ref 15–41)
Albumin: 3 g/dL — ABNORMAL LOW (ref 3.5–5.0)
Alkaline Phosphatase: 58 U/L (ref 38–126)
Anion gap: 10 (ref 5–15)
BUN: 43 mg/dL — ABNORMAL HIGH (ref 8–23)
CO2: 26 mmol/L (ref 22–32)
Calcium: 8.2 mg/dL — ABNORMAL LOW (ref 8.9–10.3)
Chloride: 93 mmol/L — ABNORMAL LOW (ref 98–111)
Creatinine, Ser: 2.11 mg/dL — ABNORMAL HIGH (ref 0.44–1.00)
GFR calc Af Amer: 24 mL/min — ABNORMAL LOW (ref >60)
GFR calc non Af Amer: 20 mL/min — ABNORMAL LOW (ref >60)
Glucose, Bld: 194 mg/dL — ABNORMAL HIGH (ref 70–99)
Potassium: 4.5 mmol/L (ref 3.5–5.1)
Sodium: 129 mmol/L — ABNORMAL LOW (ref 135–145)
Total Bilirubin: 0.6 mg/dL (ref 0.3–1.2)
Total Protein: 6.7 g/dL (ref 6.5–8.1)

## 2020-01-24 LAB — TROPONIN I (HIGH SENSITIVITY)
Troponin I (High Sensitivity): 8 ng/L (ref <18)
Troponin I (High Sensitivity): 8 ng/L (ref <18)

## 2020-01-24 LAB — LACTIC ACID, PLASMA
Lactic Acid, Venous: 1.4 mmol/L (ref 0.5–1.9)
Lactic Acid, Venous: 2.1 mmol/L (ref 0.5–1.9)

## 2020-01-24 LAB — SARS CORONAVIRUS 2 BY RT PCR (HOSPITAL ORDER, PERFORMED IN ~~LOC~~ HOSPITAL LAB): SARS Coronavirus 2: NEGATIVE

## 2020-01-24 MED ORDER — PANTOPRAZOLE SODIUM 40 MG PO TBEC
40.0000 mg | DELAYED_RELEASE_TABLET | Freq: Every day | ORAL | Status: DC
  Administered 2020-01-25: 40 mg via ORAL
  Filled 2020-01-24: qty 1

## 2020-01-24 MED ORDER — INSULIN ASPART 100 UNIT/ML ~~LOC~~ SOLN
0.0000 [IU] | Freq: Three times a day (TID) | SUBCUTANEOUS | Status: DC
  Administered 2020-01-25: 2 [IU] via SUBCUTANEOUS
  Filled 2020-01-24: qty 1

## 2020-01-24 MED ORDER — METOPROLOL SUCCINATE ER 50 MG PO TB24
25.0000 mg | ORAL_TABLET | Freq: Every day | ORAL | Status: DC
  Administered 2020-01-25: 25 mg via ORAL
  Filled 2020-01-24: qty 1

## 2020-01-24 MED ORDER — SODIUM CHLORIDE 0.9 % IV SOLN: INTRAVENOUS | Status: AC

## 2020-01-24 MED ORDER — ALBUTEROL SULFATE (2.5 MG/3ML) 0.083% IN NEBU: 2.5000 mg | INHALATION_SOLUTION | RESPIRATORY_TRACT | Status: DC | PRN

## 2020-01-24 MED ORDER — ONDANSETRON HCL 4 MG/2ML IJ SOLN: 4.0000 mg | Freq: Four times a day (QID) | INTRAMUSCULAR | Status: DC | PRN

## 2020-01-24 MED ORDER — ENOXAPARIN SODIUM 30 MG/0.3ML ~~LOC~~ SOLN: 30.0000 mg | SUBCUTANEOUS | Status: DC

## 2020-01-24 MED ORDER — TRAZODONE HCL 50 MG PO TABS: 50.0000 mg | ORAL_TABLET | Freq: Every day | ORAL | Status: DC

## 2020-01-24 MED ORDER — HEPARIN SODIUM (PORCINE) 5000 UNIT/ML IJ SOLN
5000.0000 [IU] | Freq: Three times a day (TID) | INTRAMUSCULAR | Status: DC
  Administered 2020-01-25: 5000 [IU] via SUBCUTANEOUS
  Filled 2020-01-24: qty 1

## 2020-01-24 MED ORDER — SODIUM CHLORIDE 0.9 % IV BOLUS
500.0000 mL | Freq: Once | INTRAVENOUS | Status: AC
  Administered 2020-01-24: 500 mL via INTRAVENOUS

## 2020-01-24 MED ORDER — ACETAMINOPHEN 325 MG PO TABS: 650.0000 mg | ORAL_TABLET | Freq: Four times a day (QID) | ORAL | Status: DC | PRN

## 2020-01-24 MED ORDER — ACETAMINOPHEN 650 MG RE SUPP: 650.0000 mg | Freq: Four times a day (QID) | RECTAL | Status: DC | PRN

## 2020-01-24 MED ORDER — ONDANSETRON HCL 4 MG PO TABS: 4.0000 mg | ORAL_TABLET | Freq: Four times a day (QID) | ORAL | Status: DC | PRN

## 2020-01-24 MED ORDER — HYDRALAZINE HCL 20 MG/ML IJ SOLN: 10.0000 mg | INTRAMUSCULAR | Status: DC | PRN

## 2020-01-24 MED ORDER — OXYBUTYNIN CHLORIDE ER 5 MG PO TB24
5.0000 mg | ORAL_TABLET | Freq: Every day | ORAL | Status: DC
  Administered 2020-01-25: 5 mg via ORAL
  Filled 2020-01-24: qty 1

## 2020-01-24 MED ORDER — SODIUM CHLORIDE 0.9 % IV SOLN
1.0000 g | INTRAVENOUS | Status: DC
  Administered 2020-01-24: 1 g via INTRAVENOUS
  Filled 2020-01-24: qty 10

## 2020-01-24 MED ORDER — AMLODIPINE BESYLATE 5 MG PO TABS
10.0000 mg | ORAL_TABLET | Freq: Every day | ORAL | Status: DC
  Administered 2020-01-25: 10 mg via ORAL
  Filled 2020-01-24: qty 2

## 2020-01-24 MED ORDER — INSULIN ASPART 100 UNIT/ML ~~LOC~~ SOLN: 0.0000 [IU] | Freq: Every day | SUBCUTANEOUS | Status: DC

## 2020-01-24 MED ORDER — SENNOSIDES-DOCUSATE SODIUM 8.6-50 MG PO TABS: 1.0000 | ORAL_TABLET | Freq: Every evening | ORAL | Status: DC | PRN

## 2020-01-24 MED ORDER — SODIUM CHLORIDE 0.9 % IV SOLN
1.0000 g | Freq: Once | INTRAVENOUS | Status: DC
  Filled 2020-01-24: qty 10

## 2020-01-24 MED ORDER — HYDROCODONE-ACETAMINOPHEN 5-325 MG PO TABS: 1.0000 | ORAL_TABLET | Freq: Four times a day (QID) | ORAL | Status: DC | PRN

## 2020-01-24 NOTE — ED Triage Notes (Signed)
Pt in via EMS from the hospice store with c/o falling backwards and hitting head. Pt is on blood thinners, no LOC but a little loopy after. #18 g to left [facility]

## 2020-01-24 NOTE — ED Notes (Signed)
Pt ambulated to toilet by this Rn. Pt st dysuria and polyuria for "1 week".  Pt had a BM and urine noted to be cloudy upon collection.

## 2020-01-24 NOTE — H&P (Signed)
History and Physical    Kristen Barron ZOX:[id number] DOB: 08/02/1929 DOA: 01/24/2020  PCP: Denton Lank, MD  Patient coming from: Home  I have personally briefly reviewed patient's old medical records in [place]  Chief Complaint: Weakness, fall  HPI: Kristen Barron is a 86 y.o. female with medical history significant of chronic diastolic congestive heart failure, well-controlled diabetes, hypertension, history of DVT who presents for evaluation after weakness and mechanical fall.  At baseline the patient is mostly independent however her granddaughter lives with her and provides care.  Granddaughter is a [employer].  Apparently had the patient has a history of recurrent urinary tract infections.  Granddaughter attributes this to wearing pads and depends.  Apparently the patient has felt weak and on the day of presentation suffered a mechanical fall.  Given that she is on Plavix he was brought to the emergency department.  Urine was drawn and was grossly dirty.  CT head negative for acute bleed.  Patient is mentating reasonably clear.  Vital signs are intact.  Patient had mild elevation lactic acidosis resolved with administration of IV fluids.  Initial labs revealed a creatinine of 2.11.  This is significantly above baseline of approximately 1.  As such hospitalist called for admission.   ED Course: Patient was fluid resuscitated with isotonic crystalloid fluids.  Vital signs are stable.  Urinalysis and urine culture was drawn.  Patient was started on empiric Rocephin.  CT head was done which was negative for acute bleed.  Hospitalist called for admission.  Review of Systems: As per HPI otherwise 10 point review of systems negative.    Past Medical History:  Diagnosis Date  . Arthritis   . Breast cancer ([place]) 03/06/2014   right breast with mastectomy and chemo  . Cancer [facility])    Reports lumpectomy for a cyst  . CHF (congestive heart failure) ([place])   . Collagen vascular disease ([place])     . Diabetes ([place])   . DVT (deep venous thrombosis) ([place])   . Hemorrhoids   . Hypertension   . Renal disorder   . Renal insufficiency   . Stroke [facility])    August 2014, but has had strokes prior as well    Past Surgical History:  Procedure Laterality Date  . ABDOMINAL HYSTERECTOMY     Patient not clear as to why  . BACK SURGERY    . BREAST BIOPSY Right February 15 2014   invasive mammary carcinoma/ER/PR negative, HER 2 positive  . BREAST BIOPSY Right 1999   negative biopsy  . BREAST SURGERY Right 03/06/14   mastectomy  . COLONOSCOPY WITH PROPOFOL N/A 04/15/2016   Procedure: COLONOSCOPY WITH PROPOFOL;  Surgeon: Manya Silvas, MD;  Location: [facility] ENDOSCOPY;  Service: Endoscopy;  Laterality: N/A;  . ESOPHAGOGASTRODUODENOSCOPY (EGD) WITH PROPOFOL N/A 04/15/2016   Procedure: ESOPHAGOGASTRODUODENOSCOPY (EGD) WITH PROPOFOL;  Surgeon: Manya Silvas, MD;  Location: [facility] ENDOSCOPY;  Service: Endoscopy;  Laterality: N/A;  . HAND SURGERY Right    Carpel tunnel release in the 1970s  . LUMBAR LAMINECTOMY/DECOMPRESSION MICRODISCECTOMY Bilateral 07/24/2013   Procedure: LUMBAR LAMINECTOMY/DECOMPRESSION MICRODISCECTOMY LUMBAR THREE-FOUR;  Surgeon: Charlie Pitter, MD;  Location: [place] NEURO ORS;  Service: Neurosurgery;  Laterality: Bilateral;  . MASTECTOMY Right 03/06/2014   right br ca. Dr Jamal Collin  . OOPHORECTOMY       reports that she has never smoked. She has never used smokeless tobacco. She reports that she does not drink alcohol and does not use drugs.  No  Known Allergies  Family History  Problem Relation Age of Onset  . CAD Father   . Cancer Sister 36       breast  . Breast cancer Sister 60  . Cancer Brother        kidney  . Cancer Maternal Aunt        breast  . Breast cancer Maternal Aunt 70  . Cancer Other        maternal niece with breast cancer  . Breast cancer Other   . Cancer Sister        pancreatic     Prior to Admission medications   Medication Sig Start Date End Date  Taking? Authorizing Provider  benazepril (LOTENSIN) 40 MG tablet Take 1 tablet (40 mg total) by mouth daily. 12/12/17   Nicholes Mango, MD  clopidogrel (PLAVIX) 75 MG tablet Take 75 mg by mouth daily with breakfast.    [provider]  diclofenac sodium (VOLTAREN) 1 % GEL Apply 2 g topically 4 (four) times daily.    [provider]  gabapentin (NEURONTIN) 600 MG tablet Take 600 mg by mouth 4 (four) times daily.  11/25/16   [provider]  Glucosamine 500 MG CAPS Take 500 mg by mouth 3 (three) times daily.    [provider]  hydrALAZINE (APRESOLINE) 50 MG tablet Take 50 mg by mouth 3 (three) times daily.     [provider]  insulin aspart (NOVOLOG) 100 UNIT/ML injection Inject 4 Units into the skin 3 (three) times daily before meals.     [provider]  insulin detemir (LEVEMIR) 100 UNIT/ML injection Inject 10 Units into the skin at bedtime.     [provider]  lidocaine (XYLOCAINE) 5 % ointment Apply 1 application topically as needed.    [provider]  loperamide (IMODIUM) 2 MG capsule Take by mouth as needed for diarrhea or loose stools.    [provider]  magnesium oxide (MAG-OX) 400 MG tablet Take 400 mg by mouth 2 (two) times daily.    [provider]  metoprolol succinate (TOPROL-XL) 50 MG 24 hr tablet Take 1 tablet (50 mg total) by mouth daily. Take with or immediately following a meal. 09/16/18   Fritzi Mandes, MD  [facility] 5-325 MG tablet Take 1 tablet by mouth every 6 (six) hours as needed (back pain).  10/19/17   [provider]  Omega 3 1200 MG CAPS Take 1 capsule by mouth 3 (three) times daily.     [provider]  oxybutynin (DITROPAN-XL) 5 MG 24 hr tablet Take 5 mg by mouth at bedtime.    [provider]  pantoprazole (PROTONIX) 40 MG tablet Take 40 mg by mouth daily.    [provider]  pravastatin (PRAVACHOL) 80 MG tablet Take 80 mg by mouth every evening.      [provider]  torsemide (DEMADEX) 20 MG tablet Take 60 mg by mouth daily.     [provider]  traZODone (DESYREL) 50 MG tablet Take 50 mg by mouth at bedtime.    [provider]  triamcinolone cream (KENALOG) 0.1 % Apply 1 application topically 2 (two) times daily.    [provider]  vitamin B-12 (CYANOCOBALAMIN) 1000 MCG tablet Take 1,000 mcg by mouth daily.    [provider]    Physical Exam: Vitals:   01/24/20 1334 01/24/20 1342 01/24/20 1421 01/24/20 1525  BP: (!) 84/35  (!) 124/56 (!) 168/65  Pulse: (!) 58  60 64  Resp: 18   17  Temp: 98.3 F (36.8 C)     TempSrc: Oral     SpO2: 96%  99% 98%  Weight:  84.8 kg    Height:  5\' 2"  (1.575 m)      Vitals:   01/24/20 1334 01/24/20 1342 01/24/20 1421 01/24/20 1525  BP: (!) 84/35  (!) 124/56 (!) 168/65  Pulse: (!) 58  60 64  Resp: 18   17  Temp: 98.3 F (36.8 C)     TempSrc: Oral     SpO2: 96%  99% 98%  Weight:  84.8 kg    Height:  5\' 2"  (1.575 m)     Constitutional: NAD, calm, comfortable Eyes: PERRL, lids and conjunctivae normal ENMT: Mucous membranes are moist. Posterior pharynx clear of any exudate or lesions.poor dentition.  Poor hearing Neck: normal, supple, no masses, no thyromegaly Respiratory: Decreased at bases, clear otherwise, no wheezing, normal respiratory effort Cardiovascular: Regular rate and rhythm, no murmurs / rubs / gallops. No extremity edema. 2+ pedal pulses. No carotid bruits.  Abdomen: no tenderness, no masses palpated. No hepatosplenomegaly. Bowel sounds positive.  Musculoskeletal: no clubbing / cyanosis. No joint deformity upper and lower extremities. Good ROM, no contractures. Normal muscle tone.  Skin: no rashes, lesions, ulcers. No induration Neurologic: CN 2-12 grossly intact. Sensation intact,  Psychiatric: Normal judgment and insight. Alert and oriented x 3. Normal mood.    Labs on Admission: I have personally reviewed following labs and  imaging studies  CBC: Recent Labs  Lab 01/24/20 1433  WBC 8.3  NEUTROABS 6.8  HGB 9.1*  HCT 27.2*  MCV 90.4  PLT 008   Basic Metabolic Panel: Recent Labs  Lab 01/24/20 1433  NA 129*  K 4.5  CL 93*  CO2 26  GLUCOSE 194*  BUN 43*  CREATININE 2.11*  CALCIUM 8.2*   GFR: Estimated Creatinine Clearance: 18.6 mL/min (A) (by C-G formula based on SCr of 2.11 mg/dL (H)). Liver Function Tests: Recent Labs  Lab 01/24/20 1433  AST 18  ALT 12  ALKPHOS 58  BILITOT 0.6  PROT 6.7  ALBUMIN 3.0*   No results for input(s): LIPASE, AMYLASE in the last 168 hours. No results for input(s): AMMONIA in the last 168 hours. Coagulation Profile: No results for input(s): INR, PROTIME in the last 168 hours. Cardiac Enzymes: No results for input(s): CKTOTAL, CKMB, CKMBINDEX, TROPONINI in the last 168 hours. BNP (last 3 results) No results for input(s): PROBNP in the last 8760 hours. HbA1C: No results for input(s): HGBA1C in the last 72 hours. CBG: No results for input(s): GLUCAP in the last 168 hours. Lipid Profile: No results for input(s): CHOL, HDL, LDLCALC, TRIG, CHOLHDL, LDLDIRECT in the last 72 hours. Thyroid Function Tests: No results for input(s): TSH, T4TOTAL, FREET4, T3FREE, THYROIDAB in the last 72 hours. Anemia Panel: No results for input(s): VITAMINB12, FOLATE, FERRITIN, TIBC, IRON, RETICCTPCT in the last 72 hours. Urine analysis:    Component Value Date/Time   COLORURINE YELLOW 01/24/2020 1433   APPEARANCEUR TURBID (A) 01/24/2020 1433   APPEARANCEUR Clear 11/28/2014 1616   LABSPEC 1.020 01/24/2020 1433   LABSPEC 1.008 11/28/2014 1616   PHURINE 7.0 01/24/2020 1433   GLUCOSEU NEGATIVE 01/24/2020 1433   GLUCOSEU Negative 11/28/2014 1616   HGBUR MODERATE (A) 01/24/2020 1433   BILIRUBINUR NEGATIVE 01/24/2020 1433   BILIRUBINUR Negative 11/28/2014 1616   KETONESUR NEGATIVE 01/24/2020 1433   PROTEINUR >300 (A) 01/24/2020 1433   UROBILINOGEN 0.2 09/11/2013  [place] 01/24/2020 1433   LEUKOCYTESUR LARGE (A) 01/24/2020 1433   LEUKOCYTESUR Trace 11/28/2014 1616    Radiological Exams on Admission: CT Head Wo Contrast  Result Date: 01/24/2020 CLINICAL DATA:  Each head trauma. EXAM: CT HEAD WITHOUT CONTRAST TECHNIQUE: Contiguous axial images were obtained from the base of the skull through the vertex without intravenous contrast. COMPARISON:  CT head 02/09/2019 FINDINGS: Brain: No evidence of acute infarction, hemorrhage, hydrocephalus, extra-axial collection or mass lesion/mass effect. Periventricular white matter hypoattenuation consistent with chronic small vessel ischemic change. Vascular: No hyperdense vessel or unexpected calcification. Skull: Normal. Negative for fracture or focal lesion. Sinuses/Orbits: Fluid in the right sphenoid sinus. The other visualized paranasal sinuses are clear. Orbits are unremarkable. Other: Small posterior scalp soft tissue injury/hematoma. IMPRESSION: 1. No acute intracranial pathology. 2. Small posterior scalp soft tissue injury/hematoma. Electronically Signed   By: Audie Pinto M.D.   On: 01/24/2020 14:56    Assessment/Plan Active Problems:   UTI (urinary tract infection)  Urinary tract infection Weakness Status post mechanical fall Patient with a history of falls and recurrent UTIs Per the granddaughter the patient wears pads and depends This is likely the underlying factor Considering recurrent UTIs and recurrent exposure to antibiotics will admit for observation and urine culture and sensitivities Plan: Place in observation, MedSurg Urine culture in ED, follow sensitivities Empiric Rocephin 1 g every 24 hours IV Check renal ultrasound Supplemental IV fluids Recheck labs in morning  Acute kidney injury Baseline creatinine unclear Per granddaughter at bedside patient has a history of labile kidney function Currently creatinine 2.11 on admission Suspect secondary to UTI Plan: Check renal  ultrasound IV fluids as above Hold all nephrotoxic medications  Chronic congestive heart failure with preserved ejection fraction Appears well compensated BNP minimally elevated, not indicative of exacerbation Plan: Continue metoprolol Hold home torsemide given AKI, can restart as appropriate Hold ACE inhibitor given AKI, can restart as appropriate  Essential hypertension ACE inhibitor on hold as above Continue metoprolol Add amlodipine 10 mg daily Hold p.o. hydralazine for now Add IV hydralazine 10 mg every 4 hours as needed  Diabetes mellitus Appears well controlled Last hemoglobin A1c from May 2019 Patient does not adhere to any carb modified diet at home We will start cardiac diet here CBGs before meals and at bedtime Sliding scale insulin Hold home Lantus Check A1c  Chronic lower extremity lymphedema Stage II Offered physical therapy however patient and granddaughter refused Outpatient Follow-up and consideration for compression boots  History of breast cancer Last seen in oncology clinic November 2020 No evidence of recurrent disease Outpatient follow-up  History of DVT History of CVA Per the granddaughter the patient was on Eliquis or similar DOAC however this was stopped due to cost.  The patient has been on Plavix.  Compliance with medication reported.  Considering recent fall will hold Plavix for today.  If mental status remains at baseline can restart tomorrow.  DVT prophylaxis: Lovenox  Code Status: DNR, discussed with patient and granddaughter at bedside Family Communication: Granddaughter at bedside Disposition Plan: Return to previous home environment.  Anticipate discharge home on 01/25/2020 Consults called: None Admission status: Obs, MedSurg   Sidney Ace MD [facility]   If 7PM-7AM, please contact night-coverage   01/24/2020, 5:16 PM

## 2020-01-24 NOTE — ED Notes (Signed)
Pt to CT at this time.

## 2020-01-24 NOTE — ED Notes (Signed)
Lab contacted to add urine culture.

## 2020-01-24 NOTE — ED Triage Notes (Addendum)
See first nurse note. Pt [facility] via [facility] from [facility] after pt reports falling straight back and hitting the back of her head. Pt is A&O4 and in NAD. Pt has small cut to the back of her head, bleeding controlled. Pt reports she does not remember what happened before falling. When this RN asked pt if she had been dizzy before falling she states "I just dont feel well, I have been really weak lately".

## 2020-01-24 NOTE — ED Provider Notes (Signed)
[facility] Emergency Department Provider Note ____________________________________________   First MD Initiated Contact with Patient 01/24/20 1349     (approximate)  I have reviewed the triage vital signs and the nursing notes.   HISTORY  Chief Complaint Head Injury and Fall    HPI Kristen Barron is a 86 y.o. female with PMH as noted below who presents with a head injury sustained during a fall.  Per the daughter, the patient was trying on shoes at a store when she appeared to lose her balance and fell backwards.  She had decreased responsiveness for at least a few minutes while EMS was there, but now has returned to her baseline.  The patient states that she has felt somewhat weak recently, but denies specifically feeling dizzy or lightheaded right before she fell.  She states that she still feels a bit weak now but denies other acute symptoms.  Past Medical History:  Diagnosis Date  . Arthritis   . Breast cancer ([place]) 03/06/2014   right breast with mastectomy and chemo  . Cancer [facility])    Reports lumpectomy for a cyst  . CHF (congestive heart failure) ([place])   . Collagen vascular disease ([place])   . Diabetes ([place])   . DVT (deep venous thrombosis) ([place])   . Hemorrhoids   . Hypertension   . Renal disorder   . Renal insufficiency   . Stroke [facility])    August 2014, but has had strokes prior as well    Patient Active Problem List   Diagnosis Date Noted  . AKI (acute kidney injury) ([place]) 09/14/2018  . Acute on chronic heart failure ([place]) 09/06/2018  . Chronic diastolic heart failure ([place]) 12/15/2017  . Lymphedema 12/15/2017  . Pressure injury of skin 06/08/2017  . Vomiting   . Gallstone   . Osteoarthritis of knee (Bilateral) (L>R) 12/26/2015  . Chronic pain 12/11/2015  . Long term current use of opiate analgesic 12/11/2015  . Encounter for therapeutic drug level monitoring 12/11/2015  . Chronic knee pain (Location of Primary Source of Pain)  (Bilateral) (L>R) 12/11/2015  . Long term prescription opiate use 12/11/2015  . Opiate use (20 MME/Day) 12/11/2015  . Chronic hand pain (Location of Secondary source of pain) (Bilateral) (L>R) 12/11/2015  . Failed back surgical syndrome x 2 (Surgeon: Dr. Deri Fuelling) 12/11/2015  . Lumbar spondylosis 12/11/2015  . Chronic low back pain (Location of Tertiary source of pain) (Bilateral) (L>R) 12/11/2015  . Lumbar facet syndrome 12/11/2015  . Chronic lower extremity pain (Bilateral) (L>R) 12/11/2015  . Chronic neck pain (Left) 12/11/2015  . Cervical spondylosis 12/11/2015  . Chronic shoulder pain (Right) 12/11/2015  . Insulin dependent diabetes mellitus 12/11/2015  . Abnormal MRI, lumbar spine (07/19/2013) 12/11/2015  . Lumbar postlaminectomy syndrome (L4-5) 12/11/2015  . Fusion of lumbar spine (L4-5 Ray cages) 12/11/2015  . Lumbar Levoscoliosis with apex at L4 12/11/2015  . Grade 1 Retrolisthesis of L2 over L3 and L3 over L4 12/11/2015  . Lumbar facet arthropathy 12/11/2015  . Lumbar foraminal stenosis (Bilateral L2-3, L3-4 & Left L5-S1) 12/11/2015  . Chronic anticoagulation (Plavix) 12/11/2015  . Breast cancer of lower-outer quadrant of right female breast ([place]) 02/28/2014  . Lumbar stenosis with neurogenic claudication 07/24/2013  . Lumbar central spinal stenosis ( Severe at L3-4) (Mild R>L L2-3 & L5-S1) 07/20/2013  . Hyponatremia 07/20/2013  . Hypertension 07/20/2013  . Normocytic anemia 07/20/2013  . CKD (chronic kidney disease) stage 3, GFR 30-59 ml/min 07/20/2013  .  Lumbar  DDD (degenerative disc disease) 07/20/2013    Past Surgical History:  Procedure Laterality Date  . ABDOMINAL HYSTERECTOMY     Patient not clear as to why  . BACK SURGERY    . BREAST BIOPSY Right February 15 2014   invasive mammary carcinoma/ER/PR negative, HER 2 positive  . BREAST BIOPSY Right 1999   negative biopsy  . BREAST SURGERY Right 03/06/14   mastectomy  . COLONOSCOPY WITH PROPOFOL N/A 04/15/2016    Procedure: COLONOSCOPY WITH PROPOFOL;  Surgeon: Manya Silvas, MD;  Location[facility] ENDOSCOPY;  Service: Endoscopy;  Laterality: N/A;  . ESOPHAGOGASTRODUODENOSCOPY (EGD) WITH PROPOFOL N/A 04/15/2016   Procedure: ESOPHAGOGASTRODUODENOSCOPY (EGD) WITH PROPOFOL;  Surgeon: Manya Silvas, MD;  Location: [facility] ENDOSCOPY;  Service: Endoscopy;  Laterality: N/A;  . HAND SURGERY Right    Carpel tunnel release in the 1970s  . LUMBAR LAMINECTOMY/DECOMPRESSION MICRODISCECTOMY Bilateral 07/24/2013   Procedure: LUMBAR LAMINECTOMY/DECOMPRESSION MICRODISCECTOMY LUMBAR THREE-FOUR;  Surgeon: Charlie Pitter, MD;  Location: [place] NEURO ORS;  Service: Neurosurgery;  Laterality: Bilateral;  . MASTECTOMY Right 03/06/2014   right br ca. Dr Jamal Collin  . OOPHORECTOMY      Prior to Admission medications   Medication Sig Start Date End Date Taking? Authorizing Provider  benazepril (LOTENSIN) 40 MG tablet Take 1 tablet (40 mg total) by mouth daily. 12/12/17   Nicholes Mango, MD  clopidogrel (PLAVIX) 75 MG tablet Take 75 mg by mouth daily with breakfast.    [provider]  diclofenac sodium (VOLTAREN) 1 % GEL Apply 2 g topically 4 (four) times daily.    [provider]  gabapentin (NEURONTIN) 600 MG tablet Take 600 mg by mouth 4 (four) times daily.  11/25/16   [provider]  Glucosamine 500 MG CAPS Take 500 mg by mouth 3 (three) times daily.    [provider]  hydrALAZINE (APRESOLINE) 50 MG tablet Take 50 mg by mouth 3 (three) times daily.     [provider]  insulin aspart (NOVOLOG) 100 UNIT/ML injection Inject 4 Units into the skin 3 (three) times daily before meals.     [provider]  insulin detemir (LEVEMIR) 100 UNIT/ML injection Inject 10 Units into the skin at bedtime.     [provider]  lidocaine (XYLOCAINE) 5 % ointment Apply 1 application topically as needed.    [provider]  loperamide (IMODIUM) 2 MG capsule Take by mouth as needed for  diarrhea or loose stools.    [provider]  magnesium oxide (MAG-OX) 400 MG tablet Take 400 mg by mouth 2 (two) times daily.    [provider]  metoprolol succinate (TOPROL-XL) 50 MG 24 hr tablet Take 1 tablet (50 mg total) by mouth daily. Take with or immediately following a meal. 09/16/18   Fritzi Mandes, MD  [facility] 5-325 MG tablet Take 1 tablet by mouth every 6 (six) hours as needed (back pain).  10/19/17   [provider]  Omega 3 1200 MG CAPS Take 1 capsule by mouth 3 (three) times daily.     [provider]  oxybutynin (DITROPAN-XL) 5 MG 24 hr tablet Take 5 mg by mouth at bedtime.    [provider]  pantoprazole (PROTONIX) 40 MG tablet Take 40 mg by mouth daily.    [provider]  pravastatin (PRAVACHOL) 80 MG tablet Take 80 mg by mouth every evening.     [provider]  torsemide (DEMADEX) 20 MG tablet Take 60 mg by mouth daily.  [provider]  traZODone (DESYREL) 50 MG tablet Take 50 mg by mouth at bedtime.    [provider]  triamcinolone cream (KENALOG) 0.1 % Apply 1 application topically 2 (two) times daily.    [provider]  vitamin B-12 (CYANOCOBALAMIN) 1000 MCG tablet Take 1,000 mcg by mouth daily.    [provider]    Allergies Patient has no known allergies.  Family History  Problem Relation Age of Onset  . CAD Father   . Cancer Sister 45       breast  . Breast cancer Sister 84  . Cancer Brother        kidney  . Cancer Maternal Aunt        breast  . Breast cancer Maternal Aunt 70  . Cancer Other        maternal niece with breast cancer  . Breast cancer Other   . Cancer Sister        pancreatic    Social History Social History   Tobacco Use  . Smoking status: Never Smoker  . Smokeless tobacco: Never Used  Vaping Use  . Vaping Use: Never used  Substance Use Topics  . Alcohol use: No  . Drug use: No    Review of Systems  Constitutional: No fever.   Positive for weakness. Eyes: No redness. ENT: No sore throat. Cardiovascular: Denies chest pain. Respiratory: Denies shortness of breath. Gastrointestinal: No vomiting or diarrhea.  Genitourinary: Negative for dysuria.  Musculoskeletal: Negative for back pain. Skin: Negative for rash. Neurological: Negative for headache.   ____________________________________________   PHYSICAL EXAM:  VITAL SIGNS: ED Triage Vitals  Enc Vitals Group     BP 01/24/20 1334 (!) 84/35     Pulse Rate 01/24/20 1334 (!) 58     Resp 01/24/20 1334 18     Temp 01/24/20 1334 98.3 F (36.8 C)     Temp Source 01/24/20 1334 Oral     SpO2 01/24/20 1334 96 %     Weight 01/24/20 1342 187 lb (84.8 kg)     Height 01/24/20 1342 5\' 2"  (1.575 m)     Head Circumference --      Peak Flow --      Pain Score --      Pain Loc --      Pain Edu? --      Excl. in [place]? --     Constitutional: Alert and oriented. Well appearing for age and in no acute distress. Eyes: Conjunctivae are normal.  EOMI.  PERRLA. Head: 1 cm superficial abrasion to this apical area. 1cm superficial abrasion to the occipital area.  Nose: No congestion/rhinnorhea. Mouth/Throat: Mucous membranes are moist.   Neck: Normal range of motion.  No midline cervical spinal tenderness.   Cardiovascular: Normal rate, regular rhythm. Grossly normal heart sounds.  Good peripheral circulation. Respiratory: Normal respiratory effort.  No retractions. Lungs CTAB. Gastrointestinal: Soft and nontender. No distention.  Genitourinary: No flank tenderness. Musculoskeletal: No lower extremity edema.  Extremities warm and well perfused.  Neurologic:  Normal speech and language.  Motor intact in all extremities.  Normal coordination.  No facial droop. Skin:  Skin is warm and dry. No rash noted. Psychiatric: Mood and affect are normal. Speech and behavior are normal.  ____________________________________________   LABS (all labs ordered are listed, but only  abnormal results are displayed)  Labs Reviewed  URINALYSIS, COMPLETE (UACMP) WITH MICROSCOPIC - Abnormal; Notable for the following components:  Result Value   APPearance TURBID (*)    Hgb urine dipstick MODERATE (*)    Protein, ur >300 (*)    Leukocytes,Ua LARGE (*)    Bacteria, UA MANY (*)    All other components within normal limits  COMPREHENSIVE METABOLIC PANEL - Abnormal; Notable for the following components:   Sodium 129 (*)    Chloride 93 (*)    Glucose, Bld 194 (*)    BUN 43 (*)    Creatinine, Ser 2.11 (*)    Calcium 8.2 (*)    Albumin 3.0 (*)    GFR calc non Af Amer 20 (*)    GFR calc Af Amer 24 (*)    All other components within normal limits  CBC WITH DIFFERENTIAL/PLATELET - Abnormal; Notable for the following components:   RBC 3.01 (*)    Hemoglobin 9.1 (*)    HCT 27.2 (*)    All other components within normal limits  SARS CORONAVIRUS 2 BY RT PCR (HOSPITAL ORDER, [facility])  URINE CULTURE  LACTIC ACID, PLASMA  LACTIC ACID, PLASMA  TROPONIN I (HIGH SENSITIVITY)  TROPONIN I (HIGH SENSITIVITY)   ____________________________________________  EKG  ED ECG REPORT I, Arta Silence, the attending physician, personally viewed and interpreted this ECG.  Date: 01/24/2020 EKG Time: 1339 Rate: 59 Rhythm: normal sinus rhythm QRS Axis: normal Intervals: normal ST/T Wave abnormalities: normal Narrative Interpretation: no evidence of acute ischemia  ____________________________________________  RADIOLOGY  CT head: No ICH or other acute traumatic finding  ____________________________________________   PROCEDURES  Procedure(s) performed: No  Procedures  Critical Care performed: No ____________________________________________   INITIAL IMPRESSION / ASSESSMENT AND PLAN / ED COURSE  Pertinent labs & imaging results that were available during my care of the patient were reviewed by me and considered in my medical  decision making (see chart for details).  86 year old female with PMH as noted above presents after a fall from standing height in which she hit the back of her head.  Per the daughter it appears that she lost her balance while trying on a shoe, but the patient also endorses feeling somewhat weak and lightheaded recently.  She denies syncope and had no LOC but the daughter says that she did appear somewhat less responsive after the fall.  She is on Plavix and aspirin but no anticoagulation.  I reviewed the past medical records in [place].  She was most recently seen in the ED a year ago with dizziness after a syncopal episode, and had a reassuring work-up at that time.  On exam currently she is quite well-appearing for her age.  She was initially hypotensive but this has now resolved.  Her other vital signs are normal.  Neurologic exam is nonfocal.  She has an abrasion to the occipital scalp with no other traumatic findings.  There is no midline spinal tenderness.  Overall the presentation is most consistent with a mechanical fall, however the patient's report of recent weakness and the hypotension may be playing a role as well and will require work-up.  Will obtain a CT head to evaluate for intracranial injury, basic labs, lactate, UA, and reassess.  I will give a fluid bolus.  ----------------------------------------- 4:50 PM on 01/24/2020 -----------------------------------------  CT head is negative.  The patient's urine is purulent and UA is consistent with UTI.  Lab workup also reveals AKI and slight hyponatremia, likely from dehydration.  Given these findings, we will admit.  I discussed the case with the hospitalist  and initiated antibiotics.   ____________________________________________   FINAL CLINICAL IMPRESSION(S) / ED DIAGNOSES  Final diagnoses:  Urinary tract infection without hematuria, site unspecified  Acute kidney injury ([place])  Minor head injury, initial encounter       NEW MEDICATIONS STARTED DURING THIS VISIT:  New Prescriptions   No medications on file     Note:  This document was prepared using Dragon voice recognition software and may include unintentional dictation errors.    Arta Silence, MD 01/24/20 1651

## 2020-01-24 NOTE — ED Notes (Signed)
Pt provided with meal trays and fluids. Pt eating sitting at bedside with NAD.

## 2020-01-24 NOTE — ED Notes (Signed)
Pt ambulated to toilet with 1 person assistance. Pt st feeling dizzy during ambulation. Pt placed back on stretcher safely.

## 2020-01-24 NOTE — ED Notes (Signed)
Pt to US.

## 2020-01-24 NOTE — ED Notes (Signed)
Pt IV taped and pillow placed for comfort. Antibiotics complete. NS continues to infuse. Pt readjusted in bed.

## 2020-01-25 ENCOUNTER — Ambulatory Visit: Payer: Medicare HMO | Admitting: Family

## 2020-01-25 DIAGNOSIS — N3000 Acute cystitis without hematuria: Secondary | ICD-10-CM | POA: Diagnosis not present

## 2020-01-25 LAB — BASIC METABOLIC PANEL
Anion gap: 10 (ref 5–15)
BUN: 35 mg/dL — ABNORMAL HIGH (ref 8–23)
CO2: 25 mmol/L (ref 22–32)
Calcium: 8.1 mg/dL — ABNORMAL LOW (ref 8.9–10.3)
Chloride: 98 mmol/L (ref 98–111)
Creatinine, Ser: 1.54 mg/dL — ABNORMAL HIGH (ref 0.44–1.00)
GFR calc Af Amer: 35 mL/min — ABNORMAL LOW (ref >60)
GFR calc non Af Amer: 30 mL/min — ABNORMAL LOW (ref >60)
Glucose, Bld: 132 mg/dL — ABNORMAL HIGH (ref 70–99)
Potassium: 4.7 mmol/L (ref 3.5–5.1)
Sodium: 133 mmol/L — ABNORMAL LOW (ref 135–145)

## 2020-01-25 LAB — CBC
HCT: 25.7 % — ABNORMAL LOW (ref 36.0–46.0)
Hemoglobin: 8.7 g/dL — ABNORMAL LOW (ref 12.0–15.0)
MCH: 30.6 pg (ref 26.0–34.0)
MCHC: 33.9 g/dL (ref 30.0–36.0)
MCV: 90.5 fL (ref 80.0–100.0)
Platelets: 243 10*3/uL (ref 150–400)
RBC: 2.84 MIL/uL — ABNORMAL LOW (ref 3.87–5.11)
RDW: 13.8 % (ref 11.5–15.5)
WBC: 7.6 10*3/uL (ref 4.0–10.5)
nRBC: 0 % (ref 0.0–0.2)

## 2020-01-25 LAB — URINE CULTURE

## 2020-01-25 LAB — GLUCOSE, CAPILLARY: Glucose-Capillary: 137 mg/dL — ABNORMAL HIGH (ref 70–99)

## 2020-01-25 LAB — PROTIME-INR
INR: 1 (ref 0.8–1.2)
Prothrombin Time: 12.7 seconds (ref 11.4–15.2)

## 2020-01-25 MED ORDER — CEFDINIR 300 MG PO CAPS
300.0000 mg | ORAL_CAPSULE | Freq: Two times a day (BID) | ORAL | 0 refills | Status: AC
Start: 2020-01-25 — End: 2020-01-29

## 2020-01-25 NOTE — ED Notes (Signed)
Pt resting in bed with no complaints, this RN spoke with pts grandaughter who will be here to pick up pt at 0930. D/c instructions reviewed with pt who verbalized understanding. Will take pt to lobby when grand daughter arrives. Bed locked and low, call light in reach.

## 2020-01-25 NOTE — Discharge Summary (Signed)
Physician Discharge Summary  SHANAE LUO NIO:[id number] DOB: [birth year] DOA: 01/24/2020  PCP: Denton Lank, MD  Admit date: 01/24/2020 Discharge date: 01/25/2020  Admitted From: Home Disposition:  Home  Recommendations for Outpatient Follow-up:  1. Follow up with PCP in 1-2 weeks   Home Health:No Equipment/Devices: None Discharge Condition: Stable CODE STATUS: DNR Diet recommendation: Heart Healthy / Carb Modified Brief/Interim Summary: HPI: Kristen Barron is a 86 y.o. female with medical history significant of chronic diastolic congestive heart failure, well-controlled diabetes, hypertension, history of DVT who presents for evaluation after weakness and mechanical fall.  At baseline the patient is mostly independent however her granddaughter lives with her and provides care.  Granddaughter is a [employer].  Apparently had the patient has a history of recurrent urinary tract infections.  Granddaughter attributes this to wearing pads and depends.  Apparently the patient has felt weak and on the day of presentation suffered a mechanical fall.  Given that she is on Plavix he was brought to the emergency department.  Urine was drawn and was grossly dirty.  CT head negative for acute bleed.  Patient is mentating reasonably clear.  Vital signs are intact.  Patient had mild elevation lactic acidosis resolved with administration of IV fluids.  Initial labs revealed a creatinine of 2.11.  This is significantly above baseline of approximately 1.  As such hospitalist called for admission.  6/17: Patient seen and examined.  Kidney function improved.  Patient mentating clearly.  Remainder of labs are reassuring.  Patient placed on Omnicef for UTI treatment.  Stable for discharge home at this time.  Discussed discharge plan with patient's granddaughter via phone.  All questions answered.  Patient discharged home in stable condition   Discharge Diagnoses:  Active Problems:   UTI (urinary tract  infection)  Urinary tract infection Weakness Status post mechanical fall Symptoms improved with IV fluids and antibiotics.  Change to [facility] on discharge.  Complete 5-day course.  Renal ultrasound reassuring.  No hydronephrosis.  Acute kidney injury Improved with IV fluids.  Suspect prerenal azotemia.  No hydro on ultrasound  Chronic congestive heart failure with preserved ejection fraction Stable, resume home regimen  Essential hypertension Stable, resume home regimen  Diabetes mellitus Stable, resume home regimen  Chronic lower extremity lymphedema Stage II Offered physical therapy however patient and granddaughter refused Outpatient Follow-up and consideration for compression boots  History of breast cancer Last seen in oncology clinic November 2020 No evidence of recurrent disease Outpatient follow-up  History of DVT History of CVA Can resume Plavix at time of discharge  Discharge Instructions   Allergies as of 01/25/2020   No Known Allergies     Medication List    TAKE these medications   benazepril 40 MG tablet Commonly known as: LOTENSIN Take 1 tablet (40 mg total) by mouth daily.   cefdinir 300 MG capsule Commonly known as: OMNICEF Take 1 capsule (300 mg total) by mouth 2 (two) times daily for 4 days.   clopidogrel 75 MG tablet Commonly known as: PLAVIX Take 75 mg by mouth daily with breakfast.   cyanocobalamin 1000 MCG/ML injection Commonly known as: (VITAMIN B-12) Inject 1,000 mcg into the muscle every 30 (thirty) days.   diclofenac sodium 1 % Gel Commonly known as: VOLTAREN Apply 2 g topically 4 (four) times daily.   DULoxetine 30 MG capsule Commonly known as: CYMBALTA Take 30 mg by mouth daily.   gabapentin 600 MG tablet Commonly known as: NEURONTIN Take 600 mg by mouth 4 (  four) times daily.   hydrALAZINE 50 MG tablet Commonly known as: APRESOLINE Take 50 mg by mouth 3 (three) times daily.   insulin detemir 100 UNIT/ML  injection Commonly known as: LEVEMIR Inject 8 Units into the skin at bedtime.   loperamide 2 MG capsule Commonly known as: IMODIUM Take by mouth as needed for diarrhea or loose stools.   magnesium oxide 400 MG tablet Commonly known as: MAG-OX Take 400 mg by mouth 2 (two) times daily.   metoprolol succinate 50 MG 24 hr tablet Commonly known as: TOPROL-XL Take 1 tablet (50 mg total) by mouth daily. Take with or immediately following a meal.   Norco 5-325 MG tablet Generic drug: HYDROcodone-acetaminophen Take 1 tablet by mouth every 6 (six) hours as needed (back pain).   Omega 3 1200 MG Caps Take 1 capsule by mouth 3 (three) times daily.   pantoprazole 40 MG tablet Commonly known as: PROTONIX Take 40 mg by mouth daily.   pravastatin 80 MG tablet Commonly known as: PRAVACHOL Take 80 mg by mouth every evening.   torsemide 20 MG tablet Commonly known as: DEMADEX Take 40 mg by mouth 2 (two) times daily.   traZODone 50 MG tablet Commonly known as: DESYREL Take 50 mg by mouth at bedtime.       Follow-up Information    Denton Lank, MD. Schedule an appointment as soon as possible for a visit in 1 week(s).   Specialty: Family Medicine Contact information: [address] [address] [phone]              No Known Allergies  Consultations:  none   Procedures/Studies: CT Head Wo Contrast  Result Date: 01/24/2020 CLINICAL DATA:  Each head trauma. EXAM: CT HEAD WITHOUT CONTRAST TECHNIQUE: Contiguous axial images were obtained from the base of the skull through the vertex without intravenous contrast. COMPARISON:  CT head 02/09/2019 FINDINGS: Brain: No evidence of acute infarction, hemorrhage, hydrocephalus, extra-axial collection or mass lesion/mass effect. Periventricular white matter hypoattenuation consistent with chronic small vessel ischemic change. Vascular: No hyperdense vessel or unexpected calcification. Skull: Normal. Negative for  fracture or focal lesion. Sinuses/Orbits: Fluid in the right sphenoid sinus. The other visualized paranasal sinuses are clear. Orbits are unremarkable. Other: Small posterior scalp soft tissue injury/hematoma. IMPRESSION: 1. No acute intracranial pathology. 2. Small posterior scalp soft tissue injury/hematoma. Electronically Signed   By: Audie Pinto M.D.   On: 01/24/2020 14:56   US RENAL  Result Date: 01/24/2020 CLINICAL DATA:  Acute kidney injury EXAM: RENAL / URINARY TRACT ULTRASOUND COMPLETE COMPARISON:  CT 02/09/2019, ultrasound 07/20/2013 FINDINGS: Right Kidney: Renal measurements: 9.7 x 5.3 x 4.6 cm = volume: 124.2 mL. Cortex is slightly echogenic. Mild cortical thinning. Mildly dilated right extrarenal pelvis. Cyst in the lower pole measuring 1 cm. Left Kidney: Renal measurements: 9.9 x 4.8 x 4.7 cm = volume: 117.7 mL. Cortex is slightly echogenic. Mild cortical thinning. No hydronephrosis. Mild enlargement of left renal pelvis. Bladder: Appears normal for degree of bladder distention. Other: None. IMPRESSION: 1. Renal cortical thinning with echogenic cortex consistent with mild atrophy and medical renal disease. Slightly dilated right greater than left renal pelvises but without calyceal dilatation and probably similar appearance to CT from July 2020. 2. Small cyst in the right kidney Electronically Signed   By: Donavan Foil M.D.   On: 01/24/2020 18:23    (Echo, Carotid, EGD, Colonoscopy, ERCP)    Subjective: Seen and examined at time of discharge.  No complaints,  feels well.  Stable for discharge home.  Discharge Exam: Vitals:   01/25/20 0629 01/25/20 0811  BP: (!) 156/54 (!) 159/51  Pulse: 71 70  Resp: 20 16  Temp: (!) 97.5 F (36.4 C) 99.8 F (37.7 C)  SpO2: 93% 95%   Vitals:   01/25/20 0105 01/25/20 0528 01/25/20 0629 01/25/20 0811  BP: (!) 172/76 (!) 156/54 (!) 156/54 (!) 159/51  Pulse: 85 70 71 70  Resp: 18 20 20 16   Temp: 98.9 F (37.2 C) 98.6 F (37 C) (!) 97.5  F (36.4 C) 99.8 F (37.7 C)  TempSrc: Oral Oral Oral Oral  SpO2: 98% 94% 93% 95%  Weight:      Height:        General: Pt is alert, awake, not in acute distress Cardiovascular: RRR, S1/S2 +, no rubs, no gallops Respiratory: CTA bilaterally, no wheezing, no rhonchi Abdominal: Soft, NT, ND, bowel sounds + Extremities: no edema, no cyanosis    The results of significant diagnostics from this hospitalization (including imaging, microbiology, ancillary and laboratory) are listed below for reference.     Microbiology: Recent Results (from the past 240 hour(s))  SARS Coronavirus 2 by RT PCR (hospital order, performed in [facility] hospital lab) Nasopharyngeal Nasopharyngeal Swab     Status: None   Collection Time: 01/24/20  5:02 PM   Specimen: Nasopharyngeal Swab  Result Value Ref Range Status   SARS Coronavirus 2 NEGATIVE NEGATIVE Final    Comment: (NOTE) SARS-CoV-2 target nucleic acids are NOT DETECTED.  The SARS-CoV-2 RNA is generally detectable in upper and lower respiratory specimens during the acute phase of infection. The lowest concentration of SARS-CoV-2 viral copies this assay can detect is 250 copies / mL. A negative result does not preclude SARS-CoV-2 infection and should not be used as the sole basis for treatment or other patient management decisions.  A negative result may occur with improper specimen collection / handling, submission of specimen other than nasopharyngeal swab, presence of viral mutation(s) within the areas targeted by this assay, and inadequate number of viral copies (<250 copies / mL). A negative result must be combined with clinical observations, patient history, and epidemiological information.  Fact Sheet for Patients:   StrictlyIdeas.no  Fact Sheet for Healthcare Providers: BankingDealers.co.za  This test is not yet approved or  cleared by the [place] FDA and has been authorized for  detection and/or diagnosis of SARS-CoV-2 by FDA under an Emergency Use Authorization (EUA).  This EUA will remain in effect (meaning this test can be used) for the duration of the COVID-19 declaration under Section 564(b)(1) of the Act, 21 U.S.C. section 360bbb-3(b)(1), unless the authorization is terminated or revoked sooner.  Performed at [facility], [address] [place], [place] [address]      Labs: BNP (last 3 results) Recent Labs    05/05/19 1425  BNP 518.8*   Basic Metabolic Panel: Recent Labs  Lab 01/24/20 1433 01/25/20 0611  NA 129* 133*  K 4.5 4.7  CL 93* 98  CO2 26 25  GLUCOSE 194* 132*  BUN 43* 35*  CREATININE 2.11* 1.54*  CALCIUM 8.2* 8.1*   Liver Function Tests: Recent Labs  Lab 01/24/20 1433  AST 18  ALT 12  ALKPHOS 58  BILITOT 0.6  PROT 6.7  ALBUMIN 3.0*   No results for input(s): LIPASE, AMYLASE in the last 168 hours. No results for input(s): AMMONIA in the last 168 hours. CBC: Recent Labs  Lab 01/24/20 1433 01/25/20 4166  WBC 8.3 7.6  NEUTROABS 6.8  --   HGB 9.1* 8.7*  HCT 27.2* 25.7*  MCV 90.4 90.5  PLT 276 243   Cardiac Enzymes: No results for input(s): CKTOTAL, CKMB, CKMBINDEX, TROPONINI in the last 168 hours. BNP: Invalid input(s): POCBNP CBG: Recent Labs  Lab 01/25/20 0743  GLUCAP 137*   D-Dimer No results for input(s): DDIMER in the last 72 hours. Hgb A1c No results for input(s): HGBA1C in the last 72 hours. Lipid Profile No results for input(s): CHOL, HDL, LDLCALC, TRIG, CHOLHDL, LDLDIRECT in the last 72 hours. Thyroid function studies No results for input(s): TSH, T4TOTAL, T3FREE, THYROIDAB in the last 72 hours.  Invalid input(s): FREET3 Anemia work up No results for input(s): VITAMINB12, FOLATE, FERRITIN, TIBC, IRON, RETICCTPCT in the last 72 hours. Urinalysis    Component Value Date/Time   COLORURINE YELLOW 01/24/2020 1433   APPEARANCEUR TURBID (A) 01/24/2020 1433   APPEARANCEUR Clear  11/28/2014 1616   LABSPEC 1.020 01/24/2020 1433   LABSPEC 1.008 11/28/2014 1616   PHURINE 7.0 01/24/2020 1433   GLUCOSEU NEGATIVE 01/24/2020 1433   GLUCOSEU Negative 11/28/2014 1616   HGBUR MODERATE (A) 01/24/2020 1433   BILIRUBINUR NEGATIVE 01/24/2020 1433   BILIRUBINUR Negative 11/28/2014 1616   KETONESUR NEGATIVE 01/24/2020 1433   PROTEINUR >300 (A) 01/24/2020 1433   UROBILINOGEN 0.2 09/11/2013 1037   NITRITE NEGATIVE 01/24/2020 1433   LEUKOCYTESUR LARGE (A) 01/24/2020 1433   LEUKOCYTESUR Trace 11/28/2014 1616   Sepsis Labs Invalid input(s): PROCALCITONIN,  WBC,  LACTICIDVEN Microbiology Recent Results (from the past 240 hour(s))  SARS Coronavirus 2 by RT PCR (hospital order, performed in [place] hospital lab) Nasopharyngeal Nasopharyngeal Swab     Status: None   Collection Time: 01/24/20  5:02 PM   Specimen: Nasopharyngeal Swab  Result Value Ref Range Status   SARS Coronavirus 2 NEGATIVE NEGATIVE Final    Comment: (NOTE) SARS-CoV-2 target nucleic acids are NOT DETECTED.  The SARS-CoV-2 RNA is generally detectable in upper and lower respiratory specimens during the acute phase of infection. The lowest concentration of SARS-CoV-2 viral copies this assay can detect is 250 copies / mL. A negative result does not preclude SARS-CoV-2 infection and should not be used as the sole basis for treatment or other patient management decisions.  A negative result may occur with improper specimen collection / handling, submission of specimen other than nasopharyngeal swab, presence of viral mutation(s) within the areas targeted by this assay, and inadequate number of viral copies (<250 copies / mL). A negative result must be combined with clinical observations, patient history, and epidemiological information.  Fact Sheet for Patients:   StrictlyIdeas.no  Fact Sheet for Healthcare Providers: BankingDealers.co.za  This test is not yet  approved or  cleared by the [place] FDA and has been authorized for detection and/or diagnosis of SARS-CoV-2 by FDA under an Emergency Use Authorization (EUA).  This EUA will remain in effect (meaning this test can be used) for the duration of the COVID-19 declaration under Section 564(b)(1) of the Act, 21 U.S.C. section 360bbb-3(b)(1), unless the authorization is terminated or revoked sooner.  Performed at [facility], [address], [place], [place] [address]      Time coordinating discharge: Over 30 minutes  SIGNED:   Sidney Ace, MD  [facility] 01/25/2020, 3:04 PM Pager   If 7PM-7AM, please contact night-coverage

## 2020-01-25 NOTE — Discharge Instructions (Signed)
Urinary Tract Infection, Adult A urinary tract infection (UTI) is an infection of any part of the urinary tract. The urinary tract includes:  The kidneys.  The ureters.  The bladder.  The urethra. These organs make, store, and get rid of pee (urine) in the body. What are the causes? This is caused by germs (bacteria) in your genital area. These germs grow and cause swelling (inflammation) of your urinary tract. What increases the risk? You are more likely to develop this condition if:  You have a small, thin tube (catheter) to drain pee.  You cannot control when you pee or poop (incontinence).  You are female, and: ? You use these methods to prevent pregnancy:  A medicine that kills sperm (spermicide).  A device that blocks sperm (diaphragm). ? You have low levels of a female hormone (estrogen). ? You are pregnant.  You have genes that add to your risk.  You are sexually active.  You take antibiotic medicines.  You have trouble peeing because of: ? A prostate that is bigger than normal, if you are female. ? A blockage in the part of your body that drains pee from the bladder (urethra). ? A kidney stone. ? A nerve condition that affects your bladder (neurogenic bladder). ? Not getting enough to drink. ? Not peeing often enough.  You have other conditions, such as: ? Diabetes. ? A weak disease-fighting system (immune system). ? Sickle cell disease. ? Gout. ? Injury of the spine. What are the signs or symptoms? Symptoms of this condition include:  Needing to pee right away (urgently).  Peeing often.  Peeing small amounts often.  Pain or burning when peeing.  Blood in the pee.  Pee that smells bad or not like normal.  Trouble peeing.  Pee that is cloudy.  Fluid coming from the vagina, if you are female.  Pain in the belly or lower back. Other symptoms include:  Throwing up (vomiting).  No urge to eat.  Feeling mixed up (confused).  Being tired  and grouchy (irritable).  A fever.  Watery poop (diarrhea). How is this treated? This condition may be treated with:  Antibiotic medicine.  Other medicines.  Drinking enough water. Follow these instructions at home:  Medicines  Take over-the-counter and prescription medicines only as told by your doctor.  If you were prescribed an antibiotic medicine, take it as told by your doctor. Do not stop taking it even if you start to feel better. General instructions  Make sure you: ? Pee until your bladder is empty. ? Do not hold pee for a long time. ? Empty your bladder after sex. ? Wipe from front to back after pooping if you are a female. Use each tissue one time when you wipe.  Drink enough fluid to keep your pee pale yellow.  Keep all follow-up visits as told by your doctor. This is important. Contact a doctor if:  You do not get better after 1-2 days.  Your symptoms go away and then come back. Get help right away if:  You have very bad back pain.  You have very bad pain in your lower belly.  You have a fever.  You are sick to your stomach (nauseous).  You are throwing up. Summary  A urinary tract infection (UTI) is an infection of any part of the urinary tract.  This condition is caused by germs in your genital area.  There are many risk factors for a UTI. These include having a small, thin   tube to drain pee and not being able to control when you pee or poop.  Treatment includes antibiotic medicines for germs.  Drink enough fluid to keep your pee pale yellow. This information is not intended to replace advice given to you by your health care provider. Make sure you discuss any questions you have with your health care provider. Document Revised: 07/14/2018 Document Reviewed: 02/03/2018 Elsevier Patient Education  2020 [employer]  

## 2020-01-26 LAB — HEMOGLOBIN A1C
Hgb A1c MFr Bld: 6.1 % — ABNORMAL HIGH (ref 4.8–5.6)
Mean Plasma Glucose: 128 mg/dL

## 2020-01-31 ENCOUNTER — Other Ambulatory Visit: Payer: Self-pay

## 2020-01-31 ENCOUNTER — Ambulatory Visit
Admission: RE | Admit: 2020-01-31 | Discharge: 2020-01-31 | Disposition: A | Discharge status: Home / Self Care | Payer: Medicare HMO | Source: Ambulatory Visit | Attending: Acute Care | Admitting: Acute Care

## 2020-01-31 DIAGNOSIS — R937 Abnormal findings on diagnostic imaging of other parts of musculoskeletal system: Secondary | ICD-10-CM | POA: Diagnosis not present

## 2020-02-23 ENCOUNTER — Telehealth: Payer: Self-pay | Admitting: *Deleted
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
## 2020-03-04 ENCOUNTER — Ambulatory Visit: Payer: Medicare HMO | Admitting: Family

## 2020-03-10 DEATH — deceased

## 2020-06-27 ENCOUNTER — Ambulatory Visit: Payer: Medicare HMO | Admitting: Oncology
